# Patient Record
Sex: Male | Born: 1945
Health system: Southern US, Community
[De-identification: ages and names within clinical notes are randomized; demographics above are authoritative.]

## PROBLEM LIST (undated history)

## (undated) DIAGNOSIS — Z87898 Personal history of other specified conditions: Secondary | ICD-10-CM

## (undated) DIAGNOSIS — K5730 Diverticulosis of large intestine without perforation or abscess without bleeding: Secondary | ICD-10-CM

## (undated) DIAGNOSIS — T4145XA Adverse effect of unspecified anesthetic, initial encounter: Secondary | ICD-10-CM

## (undated) DIAGNOSIS — G589 Mononeuropathy, unspecified: Secondary | ICD-10-CM

## (undated) DIAGNOSIS — Z860100 Personal history of colon polyps, unspecified: Secondary | ICD-10-CM

## (undated) DIAGNOSIS — T8859XA Other complications of anesthesia, initial encounter: Secondary | ICD-10-CM

## (undated) DIAGNOSIS — M1990 Unspecified osteoarthritis, unspecified site: Secondary | ICD-10-CM

## (undated) DIAGNOSIS — R339 Retention of urine, unspecified: Secondary | ICD-10-CM

## (undated) DIAGNOSIS — Z973 Presence of spectacles and contact lenses: Secondary | ICD-10-CM

## (undated) DIAGNOSIS — S149XXA Injury of unspecified nerves of neck, initial encounter: Secondary | ICD-10-CM

## (undated) DIAGNOSIS — G629 Polyneuropathy, unspecified: Secondary | ICD-10-CM

## (undated) DIAGNOSIS — Z87442 Personal history of urinary calculi: Secondary | ICD-10-CM

## (undated) DIAGNOSIS — Z974 Presence of external hearing-aid: Secondary | ICD-10-CM

## (undated) DIAGNOSIS — Z8614 Personal history of Methicillin resistant Staphylococcus aureus infection: Secondary | ICD-10-CM

## (undated) DIAGNOSIS — N401 Enlarged prostate with lower urinary tract symptoms: Secondary | ICD-10-CM

## (undated) HISTORY — PX: COLONOSCOPY: SHX174

## (undated) HISTORY — PX: LUMBAR FUSION: SHX111

## (undated) HISTORY — PX: CYSTOSCOPY WITH INSERTION OF UROLIFT: SHX6678

## (undated) HISTORY — PX: ELBOW SURGERY: SHX618

## (undated) HISTORY — PX: TOTAL HIP REVISION: SHX763

## (undated) HISTORY — PX: ROTATOR CUFF REPAIR: SHX139

---

## 1966-02-10 HISTORY — PX: HAND SURGERY: SHX662

## 1968-02-11 HISTORY — PX: LAMINECTOMY: SHX219

## 1973-02-10 HISTORY — PX: CYST REMOVAL LEG: SHX6280

## 1974-02-10 HISTORY — PX: SHOULDER SURGERY: SHX246

## 1975-02-11 HISTORY — PX: DISTAL CLAVICLE EXCISION: SHX1463

## 1975-02-11 HISTORY — PX: SHOULDER ARTHROSCOPY WITH DISTAL CLAVICLE RESECTION: SHX5675

## 2000-01-31 ENCOUNTER — Ambulatory Visit (HOSPITAL_COMMUNITY): Admission: RE | Admit: 2000-01-31 | Discharge: 2000-01-31 | Payer: Self-pay | Admitting: Gastroenterology

## 2005-02-10 DIAGNOSIS — Z8614 Personal history of Methicillin resistant Staphylococcus aureus infection: Secondary | ICD-10-CM

## 2005-02-10 HISTORY — DX: Personal history of Methicillin resistant Staphylococcus aureus infection: Z86.14

## 2005-05-26 ENCOUNTER — Ambulatory Visit: Payer: Self-pay | Admitting: Infectious Diseases

## 2005-05-26 ENCOUNTER — Inpatient Hospital Stay (HOSPITAL_COMMUNITY): Admission: RE | Admit: 2005-05-26 | Discharge: 2005-06-04 | Payer: Self-pay | Admitting: Orthopedic Surgery

## 2015-03-01 DIAGNOSIS — M9902 Segmental and somatic dysfunction of thoracic region: Secondary | ICD-10-CM | POA: Diagnosis not present

## 2015-03-01 DIAGNOSIS — M542 Cervicalgia: Secondary | ICD-10-CM | POA: Diagnosis not present

## 2015-03-01 DIAGNOSIS — M9905 Segmental and somatic dysfunction of pelvic region: Secondary | ICD-10-CM | POA: Diagnosis not present

## 2015-03-01 DIAGNOSIS — M9904 Segmental and somatic dysfunction of sacral region: Secondary | ICD-10-CM | POA: Diagnosis not present

## 2015-03-01 DIAGNOSIS — M9903 Segmental and somatic dysfunction of lumbar region: Secondary | ICD-10-CM | POA: Diagnosis not present

## 2015-03-01 DIAGNOSIS — M9901 Segmental and somatic dysfunction of cervical region: Secondary | ICD-10-CM | POA: Diagnosis not present

## 2015-03-01 DIAGNOSIS — M545 Low back pain: Secondary | ICD-10-CM | POA: Diagnosis not present

## 2015-03-29 DIAGNOSIS — M9904 Segmental and somatic dysfunction of sacral region: Secondary | ICD-10-CM | POA: Diagnosis not present

## 2015-03-29 DIAGNOSIS — M9901 Segmental and somatic dysfunction of cervical region: Secondary | ICD-10-CM | POA: Diagnosis not present

## 2015-03-29 DIAGNOSIS — Z23 Encounter for immunization: Secondary | ICD-10-CM | POA: Diagnosis not present

## 2015-03-29 DIAGNOSIS — M542 Cervicalgia: Secondary | ICD-10-CM | POA: Diagnosis not present

## 2015-03-29 DIAGNOSIS — M9903 Segmental and somatic dysfunction of lumbar region: Secondary | ICD-10-CM | POA: Diagnosis not present

## 2015-03-29 DIAGNOSIS — M9905 Segmental and somatic dysfunction of pelvic region: Secondary | ICD-10-CM | POA: Diagnosis not present

## 2015-03-29 DIAGNOSIS — M545 Low back pain: Secondary | ICD-10-CM | POA: Diagnosis not present

## 2015-03-29 DIAGNOSIS — M9902 Segmental and somatic dysfunction of thoracic region: Secondary | ICD-10-CM | POA: Diagnosis not present

## 2015-05-01 DIAGNOSIS — M542 Cervicalgia: Secondary | ICD-10-CM | POA: Diagnosis not present

## 2015-05-01 DIAGNOSIS — M9903 Segmental and somatic dysfunction of lumbar region: Secondary | ICD-10-CM | POA: Diagnosis not present

## 2015-05-01 DIAGNOSIS — M9901 Segmental and somatic dysfunction of cervical region: Secondary | ICD-10-CM | POA: Diagnosis not present

## 2015-05-01 DIAGNOSIS — M9902 Segmental and somatic dysfunction of thoracic region: Secondary | ICD-10-CM | POA: Diagnosis not present

## 2015-05-01 DIAGNOSIS — M9905 Segmental and somatic dysfunction of pelvic region: Secondary | ICD-10-CM | POA: Diagnosis not present

## 2015-05-01 DIAGNOSIS — M545 Low back pain: Secondary | ICD-10-CM | POA: Diagnosis not present

## 2015-05-01 DIAGNOSIS — M9904 Segmental and somatic dysfunction of sacral region: Secondary | ICD-10-CM | POA: Diagnosis not present

## 2015-05-02 DIAGNOSIS — Z1389 Encounter for screening for other disorder: Secondary | ICD-10-CM | POA: Diagnosis not present

## 2015-05-02 DIAGNOSIS — Z79899 Other long term (current) drug therapy: Secondary | ICD-10-CM | POA: Diagnosis not present

## 2015-05-07 DIAGNOSIS — M5010 Cervical disc disorder with radiculopathy, unspecified cervical region: Secondary | ICD-10-CM | POA: Diagnosis not present

## 2015-05-07 DIAGNOSIS — G4700 Insomnia, unspecified: Secondary | ICD-10-CM | POA: Diagnosis not present

## 2015-05-07 DIAGNOSIS — R7309 Other abnormal glucose: Secondary | ICD-10-CM | POA: Diagnosis not present

## 2015-05-07 DIAGNOSIS — L570 Actinic keratosis: Secondary | ICD-10-CM | POA: Diagnosis not present

## 2015-05-16 DIAGNOSIS — Z7189 Other specified counseling: Secondary | ICD-10-CM | POA: Diagnosis not present

## 2015-05-16 DIAGNOSIS — L814 Other melanin hyperpigmentation: Secondary | ICD-10-CM | POA: Diagnosis not present

## 2015-05-16 DIAGNOSIS — D225 Melanocytic nevi of trunk: Secondary | ICD-10-CM | POA: Diagnosis not present

## 2015-05-16 DIAGNOSIS — D2272 Melanocytic nevi of left lower limb, including hip: Secondary | ICD-10-CM | POA: Diagnosis not present

## 2015-05-16 DIAGNOSIS — L570 Actinic keratosis: Secondary | ICD-10-CM | POA: Diagnosis not present

## 2015-05-16 DIAGNOSIS — L821 Other seborrheic keratosis: Secondary | ICD-10-CM | POA: Diagnosis not present

## 2015-05-16 DIAGNOSIS — D485 Neoplasm of uncertain behavior of skin: Secondary | ICD-10-CM | POA: Diagnosis not present

## 2015-05-16 DIAGNOSIS — L578 Other skin changes due to chronic exposure to nonionizing radiation: Secondary | ICD-10-CM | POA: Diagnosis not present

## 2015-05-16 DIAGNOSIS — I788 Other diseases of capillaries: Secondary | ICD-10-CM | POA: Diagnosis not present

## 2015-05-29 DIAGNOSIS — M542 Cervicalgia: Secondary | ICD-10-CM | POA: Diagnosis not present

## 2015-05-29 DIAGNOSIS — M9901 Segmental and somatic dysfunction of cervical region: Secondary | ICD-10-CM | POA: Diagnosis not present

## 2015-05-29 DIAGNOSIS — M545 Low back pain: Secondary | ICD-10-CM | POA: Diagnosis not present

## 2015-05-29 DIAGNOSIS — M9904 Segmental and somatic dysfunction of sacral region: Secondary | ICD-10-CM | POA: Diagnosis not present

## 2015-05-29 DIAGNOSIS — M9902 Segmental and somatic dysfunction of thoracic region: Secondary | ICD-10-CM | POA: Diagnosis not present

## 2015-05-29 DIAGNOSIS — M9905 Segmental and somatic dysfunction of pelvic region: Secondary | ICD-10-CM | POA: Diagnosis not present

## 2015-05-29 DIAGNOSIS — M9903 Segmental and somatic dysfunction of lumbar region: Secondary | ICD-10-CM | POA: Diagnosis not present

## 2015-07-03 DIAGNOSIS — M545 Low back pain: Secondary | ICD-10-CM | POA: Diagnosis not present

## 2015-07-03 DIAGNOSIS — M9903 Segmental and somatic dysfunction of lumbar region: Secondary | ICD-10-CM | POA: Diagnosis not present

## 2015-07-03 DIAGNOSIS — M9901 Segmental and somatic dysfunction of cervical region: Secondary | ICD-10-CM | POA: Diagnosis not present

## 2015-07-03 DIAGNOSIS — M542 Cervicalgia: Secondary | ICD-10-CM | POA: Diagnosis not present

## 2015-07-03 DIAGNOSIS — M9902 Segmental and somatic dysfunction of thoracic region: Secondary | ICD-10-CM | POA: Diagnosis not present

## 2015-07-03 DIAGNOSIS — M9905 Segmental and somatic dysfunction of pelvic region: Secondary | ICD-10-CM | POA: Diagnosis not present

## 2015-07-03 DIAGNOSIS — M9904 Segmental and somatic dysfunction of sacral region: Secondary | ICD-10-CM | POA: Diagnosis not present

## 2015-08-02 DIAGNOSIS — M9902 Segmental and somatic dysfunction of thoracic region: Secondary | ICD-10-CM | POA: Diagnosis not present

## 2015-08-02 DIAGNOSIS — M9901 Segmental and somatic dysfunction of cervical region: Secondary | ICD-10-CM | POA: Diagnosis not present

## 2015-08-02 DIAGNOSIS — M9905 Segmental and somatic dysfunction of pelvic region: Secondary | ICD-10-CM | POA: Diagnosis not present

## 2015-08-02 DIAGNOSIS — M9904 Segmental and somatic dysfunction of sacral region: Secondary | ICD-10-CM | POA: Diagnosis not present

## 2015-08-02 DIAGNOSIS — M545 Low back pain: Secondary | ICD-10-CM | POA: Diagnosis not present

## 2015-08-02 DIAGNOSIS — M542 Cervicalgia: Secondary | ICD-10-CM | POA: Diagnosis not present

## 2015-08-02 DIAGNOSIS — M9903 Segmental and somatic dysfunction of lumbar region: Secondary | ICD-10-CM | POA: Diagnosis not present

## 2015-09-28 DIAGNOSIS — L570 Actinic keratosis: Secondary | ICD-10-CM | POA: Diagnosis not present

## 2015-10-25 DIAGNOSIS — M25552 Pain in left hip: Secondary | ICD-10-CM | POA: Diagnosis not present

## 2015-10-25 DIAGNOSIS — G8929 Other chronic pain: Secondary | ICD-10-CM | POA: Diagnosis not present

## 2015-10-25 DIAGNOSIS — Z981 Arthrodesis status: Secondary | ICD-10-CM | POA: Diagnosis not present

## 2015-10-25 DIAGNOSIS — M170 Bilateral primary osteoarthritis of knee: Secondary | ICD-10-CM | POA: Diagnosis not present

## 2015-10-25 DIAGNOSIS — R2689 Other abnormalities of gait and mobility: Secondary | ICD-10-CM | POA: Diagnosis not present

## 2015-10-25 DIAGNOSIS — Z9689 Presence of other specified functional implants: Secondary | ICD-10-CM | POA: Diagnosis not present

## 2015-10-25 DIAGNOSIS — M533 Sacrococcygeal disorders, not elsewhere classified: Secondary | ICD-10-CM | POA: Diagnosis not present

## 2015-10-25 DIAGNOSIS — M1612 Unilateral primary osteoarthritis, left hip: Secondary | ICD-10-CM | POA: Diagnosis not present

## 2015-10-29 ENCOUNTER — Encounter: Payer: Self-pay | Admitting: Family Medicine

## 2015-10-29 DIAGNOSIS — R7309 Other abnormal glucose: Secondary | ICD-10-CM | POA: Diagnosis not present

## 2015-11-07 DIAGNOSIS — M25559 Pain in unspecified hip: Secondary | ICD-10-CM | POA: Diagnosis not present

## 2015-11-07 DIAGNOSIS — R7309 Other abnormal glucose: Secondary | ICD-10-CM | POA: Diagnosis not present

## 2015-11-07 DIAGNOSIS — Z6834 Body mass index (BMI) 34.0-34.9, adult: Secondary | ICD-10-CM | POA: Diagnosis not present

## 2015-11-09 DIAGNOSIS — M25552 Pain in left hip: Secondary | ICD-10-CM | POA: Diagnosis not present

## 2015-11-09 DIAGNOSIS — M1612 Unilateral primary osteoarthritis, left hip: Secondary | ICD-10-CM | POA: Diagnosis not present

## 2015-11-13 NOTE — Progress Notes (Signed)
Pt is being scheduled for preop appt; please place surgical orders in epic. Thanks.  

## 2015-11-14 NOTE — Patient Instructions (Addendum)
Jacob Mitchell  11/14/2015   Your procedure is scheduled on: 11/23/2015    Report to [facility]  Entrance take [place]  elevators to 3rd floor to  [place] at 1130 AM.  Call this number if you have problems the morning of surgery [phone]   Remember: ONLY 1 PERSON MAY GO WITH YOU TO SHORT STAY TO GET  READY MORNING OF [place].  Do not eat food after midnite.  May have clear liquids from 12 midnite until 0700am morning of surgery then nothing by mouth.       Take these medicines the morning of surgery with A SIP OF WATER: none                                 You may not have any metal on your body including hair pins and              piercings  Do not wear jewelry, lotions, powders or perfumes, deodorant                         Men may shave face and neck.   Do not bring valuables to the hospital. [place].  Contacts, dentures or bridgework may not be worn into surgery.  Leave suitcase in the car. After surgery it may be brought to your room.               Please read over the following fact sheets you were given: _____________________________________________________________________             [facility] - Preparing for Surgery Before surgery, you can play an important role.  Because skin is not sterile, your skin needs to be as free of germs as possible.  You can reduce the number of germs on your skin by washing with CHG (chlorahexidine gluconate) soap before surgery.  CHG is an antiseptic cleaner which kills germs and bonds with the skin to continue killing germs even after washing. Please DO NOT use if you have an allergy to CHG or antibacterial soaps.  If your skin becomes reddened/irritated stop using the CHG and inform your nurse when you arrive at Short Stay. Do not shave (including legs and underarms) for at least 48 hours prior to the first CHG shower.  You may shave your  face/neck. Please follow these instructions carefully:  1.  Shower with CHG Soap the night before surgery and the  morning of Surgery.  2.  If you choose to wash your hair, wash your hair first as usual with your  normal  shampoo.  3.  After you shampoo, rinse your hair and body thoroughly to remove the  shampoo.                           4.  Use CHG as you would any other liquid soap.  You can apply chg directly  to the skin and wash                       Gently with a scrungie or clean washcloth.  5.  Apply the CHG Soap to your  body ONLY FROM THE NECK DOWN.   Do not use on face/ open                           Wound or open sores. Avoid contact with eyes, ears mouth and genitals (private parts).                       Wash face,  Genitals (private parts) with your normal soap.             6.  Wash thoroughly, paying special attention to the area where your surgery  will be performed.  7.  Thoroughly rinse your body with warm water from the neck down.  8.  DO NOT shower/wash with your normal soap after using and rinsing off  the CHG Soap.                9.  Pat yourself dry with a clean towel.            10.  Wear clean pajamas.            11.  Place clean sheets on your bed the night of your first shower and do not  sleep with pets. Day of Surgery : Do not apply any lotions/deodorants the morning of surgery.  Please wear clean clothes to the hospital/surgery center.  FAILURE TO FOLLOW THESE INSTRUCTIONS MAY RESULT IN THE CANCELLATION OF YOUR SURGERY PATIENT SIGNATURE_________________________________  NURSE SIGNATURE__________________________________  ________________________________________________________________________  WHAT IS A BLOOD TRANSFUSION? Blood Transfusion Information  A transfusion is the replacement of blood or some of its parts. Blood is made up of multiple cells which provide different functions.  Red blood cells carry oxygen and are used for blood loss  replacement.  White blood cells fight against infection.  Platelets control bleeding.  Plasma helps clot blood.  Other blood products are available for specialized needs, such as hemophilia or other clotting disorders. BEFORE THE TRANSFUSION  Who gives blood for transfusions?   Healthy volunteers who are fully evaluated to make sure their blood is safe. This is blood bank blood. Transfusion therapy is the safest it has ever been in the practice of medicine. Before blood is taken from a donor, a complete history is taken to make sure that person has no history of diseases nor engages in risky social behavior (examples are intravenous drug use or sexual activity with multiple partners). The donor's travel history is screened to minimize risk of transmitting infections, such as malaria. The donated blood is tested for signs of infectious diseases, such as HIV and hepatitis. The blood is then tested to be sure it is compatible with you in order to minimize the chance of a transfusion reaction. If you or a relative donates blood, this is often done in anticipation of surgery and is not appropriate for emergency situations. It takes many days to process the donated blood. RISKS AND COMPLICATIONS Although transfusion therapy is very safe and saves many lives, the main dangers of transfusion include:   Getting an infectious disease.  Developing a transfusion reaction. This is an allergic reaction to something in the blood you were given. Every precaution is taken to prevent this. The decision to have a blood transfusion has been considered carefully by your caregiver before blood is given. Blood is not given unless the benefits outweigh the risks. AFTER THE TRANSFUSION  Right after receiving a blood transfusion, you will usually feel  much better and more energetic. This is especially true if your red blood cells have gotten low (anemic). The transfusion raises the level of the red blood cells which  carry oxygen, and this usually causes an energy increase.  The nurse administering the transfusion will monitor you carefully for complications. HOME CARE INSTRUCTIONS  No special instructions are needed after a transfusion. You may find your energy is better. Speak with your caregiver about any limitations on activity for underlying diseases you may have. SEEK MEDICAL CARE IF:   Your condition is not improving after your transfusion.  You develop redness or irritation at the intravenous (IV) site. SEEK IMMEDIATE MEDICAL CARE IF:  Any of the following symptoms occur over the next 12 hours:  Shaking chills.  You have a temperature by mouth above 102 F (38.9 C), not controlled by medicine.  Chest, back, or muscle pain.  People around you feel you are not acting correctly or are confused.  Shortness of breath or difficulty breathing.  Dizziness and fainting.  You get a rash or develop hives.  You have a decrease in urine output.  Your urine turns a dark color or changes to pink, red, or brown. Any of the following symptoms occur over the next 10 days:  You have a temperature by mouth above 102 F (38.9 C), not controlled by medicine.  Shortness of breath.  Weakness after normal activity.  The white part of the eye turns yellow (jaundice).  You have a decrease in the amount of urine or are urinating less often.  Your urine turns a dark color or changes to pink, red, or brown. Document Released: 01/25/2000 Document Revised: 04/21/2011 Document Reviewed: 09/13/2007 ExitCare Patient Information 2014 [place].  _______________________________________________________________________  Incentive Spirometer  An incentive spirometer is a tool that can help keep your lungs clear and active. This tool measures how well you are filling your lungs with each breath. Taking long deep breaths may help reverse or decrease the chance of developing breathing (pulmonary) problems  (especially infection) following:  A long period of time when you are unable to move or be active. BEFORE THE PROCEDURE   If the spirometer includes an indicator to show your best effort, your nurse or respiratory therapist will set it to a desired goal.  If possible, sit up straight or lean slightly forward. Try not to slouch.  Hold the incentive spirometer in an upright position. INSTRUCTIONS FOR USE  1. Sit on the edge of your bed if possible, or sit up as far as you can in bed or on a chair. 2. Hold the incentive spirometer in an upright position. 3. Breathe out normally. 4. Place the mouthpiece in your mouth and seal your lips tightly around it. 5. Breathe in slowly and as deeply as possible, raising the piston or the ball toward the top of the column. 6. Hold your breath for 3-5 seconds or for as long as possible. Allow the piston or ball to fall to the bottom of the column. 7. Remove the mouthpiece from your mouth and breathe out normally. 8. Rest for a few seconds and repeat Steps 1 through 7 at least 10 times every 1-2 hours when you are awake. Take your time and take a few normal breaths between deep breaths. 9. The spirometer may include an indicator to show your best effort. Use the indicator as a goal to work toward during each repetition. 10. After each set of 10 deep breaths, practice coughing to be  sure your lungs are clear. If you have an incision (the cut made at the time of surgery), support your incision when coughing by placing a pillow or rolled up towels firmly against it. Once you are able to get out of bed, walk around indoors and cough well. You may stop using the incentive spirometer when instructed by your caregiver.  RISKS AND COMPLICATIONS  Take your time so you do not get dizzy or light-headed.  If you are in pain, you may need to take or ask for pain medication before doing incentive spirometry. It is harder to take a deep breath if you are having  pain. AFTER USE  Rest and breathe slowly and easily.  It can be helpful to keep track of a log of your progress. Your caregiver can provide you with a simple table to help with this. If you are using the spirometer at home, follow these instructions: [place] IF:   You are having difficultly using the spirometer.  You have trouble using the spirometer as often as instructed.  Your pain medication is not giving enough relief while using the spirometer.  You develop fever of 100.5 F (38.1 C) or higher. SEEK IMMEDIATE MEDICAL CARE IF:   You cough up bloody sputum that had not been present before.  You develop fever of 102 F (38.9 C) or greater.  You develop worsening pain at or near the incision site. MAKE SURE YOU:   Understand these instructions.  Will watch your condition.  Will get help right away if you are not doing well or get worse. Document Released: 06/09/2006 Document Revised: 04/21/2011 Document Reviewed: 08/10/2006 ExitCare Patient Information 2014 [place].   ________________________________________________________________________    CLEAR LIQUID DIET   Foods Allowed                                                                     Foods Excluded  Coffee and tea, regular and decaf                             liquids that you cannot  Plain Jell-O in any flavor                                             see through such as: Fruit ices (not with fruit pulp)                                     milk, soups, orange juice  Iced Popsicles                                    All solid food Carbonated beverages, regular and diet                                    Cranberry, grape and apple juices Sports drinks like  Gatorade Lightly seasoned clear broth or consume(fat free) Sugar, honey syrup  Sample Menu Breakfast                                Lunch                                     Supper Cranberry juice                    Beef broth                             Chicken broth Jell-O                                     Grape juice                           Apple juice Coffee or tea                        Jell-O                                      Popsicle                                                Coffee or tea                        Coffee or tea  _____________________________________________________________________

## 2015-11-16 ENCOUNTER — Encounter (INDEPENDENT_AMBULATORY_CARE_PROVIDER_SITE_OTHER): Payer: Self-pay

## 2015-11-16 ENCOUNTER — Encounter (HOSPITAL_COMMUNITY): Payer: Self-pay

## 2015-11-16 ENCOUNTER — Encounter (HOSPITAL_COMMUNITY)
Admission: RE | Admit: 2015-11-16 | Discharge: 2015-11-16 | Disposition: A | Discharge status: Home / Self Care | Payer: Medicare Other | Source: Ambulatory Visit | Attending: Orthopedic Surgery | Admitting: Orthopedic Surgery

## 2015-11-16 DIAGNOSIS — M1612 Unilateral primary osteoarthritis, left hip: Secondary | ICD-10-CM | POA: Diagnosis not present

## 2015-11-16 DIAGNOSIS — Z01818 Encounter for other preprocedural examination: Secondary | ICD-10-CM | POA: Insufficient documentation

## 2015-11-16 DIAGNOSIS — Z01812 Encounter for preprocedural laboratory examination: Secondary | ICD-10-CM | POA: Diagnosis not present

## 2015-11-16 HISTORY — DX: Adverse effect of unspecified anesthetic, initial encounter: T41.45XA

## 2015-11-16 HISTORY — DX: Personal history of urinary calculi: Z87.442

## 2015-11-16 HISTORY — DX: Other complications of anesthesia, initial encounter: T88.59XA

## 2015-11-16 LAB — CBC
HCT: 45.2 % (ref 39.0–52.0)
Hemoglobin: 15 g/dL (ref 13.0–17.0)
MCH: 29.3 pg (ref 26.0–34.0)
MCHC: 33.2 g/dL (ref 30.0–36.0)
MCV: 88.3 fL (ref 78.0–100.0)
Platelets: 211 10*3/uL (ref 150–400)
RBC: 5.12 MIL/uL (ref 4.22–5.81)
RDW: 13.2 % (ref 11.5–15.5)
WBC: 6.8 10*3/uL (ref 4.0–10.5)

## 2015-11-16 LAB — SURGICAL PCR SCREEN
MRSA, PCR: NEGATIVE
Staphylococcus aureus: POSITIVE — AB

## 2015-11-16 LAB — ABO/RH: ABO/RH(D): O POS

## 2015-11-16 NOTE — H&P (Signed)
TOTAL HIP ADMISSION H&P  Patient is admitted for left total hip arthroplasty, anterior approach.  Subjective:  Chief Complaint:    Left hip primary OA / pain  HPI: Jacob Mitchell, 70 y.o. male, has a history of pain and functional disability in the left hip(s) due to arthritis and patient has failed non-surgical conservative treatments for greater than 12 weeks to include NSAID's and/or analgesics, use of assistive devices and activity modification.  Onset of symptoms was gradual starting years ago with gradually worsening course since that time.The patient noted no past surgery on the left hip(s).  Patient currently rates pain in the left hip at 9 out of 10 with activity. Patient has worsening of pain with activity and weight bearing, trendelenberg gait, pain that interfers with activities of daily living and pain with passive range of motion. Patient has evidence of periarticular osteophytes and joint space narrowing by imaging studies. This condition presents safety issues increasing the risk of falls.  There is no current active infection.  Risks, benefits and expectations were discussed with the patient.  Risks including but not limited to the risk of anesthesia, blood clots, nerve damage, blood vessel damage, failure of the prosthesis, infection and up to and including death.  Patient understand the risks, benefits and expectations and wishes to proceed with surgery.   PCP: No primary care provider on file.  D/C Plans:      Home  Post-op Meds:       No Rx given  Tranexamic Acid:      To be given - IV   Decadron:      Is to be given  FYI:     ASA  Norco    Past Medical History:  Diagnosis Date  . Arthritis   . Complication of anesthesia    pt says he gets "restless" when he is waking up and nurses have had to "hold him down"  . History of kidney stones     Past Surgical History:  Procedure Laterality Date  . CYST REMOVAL LEG Right   . [place]  . HAND SURGERY  1968  .  LAMINECTOMY  1970  . LUMBAR FUSION     L1-L5  . ROTATOR CUFF REPAIR     5 surgeries in 9 days  . SHOULDER SURGERY Left 1976    No prescriptions prior to admission.   No Known Allergies   Social History  Substance Use Topics  . Smoking status: Never Smoker  . Smokeless tobacco: Never Used  . Alcohol use No       Review of Systems  Constitutional: Negative.   HENT: Negative.   Eyes: Negative.   Respiratory: Negative.   Cardiovascular: Negative.   Gastrointestinal: Negative.   Genitourinary: Negative.   Musculoskeletal: Positive for joint pain.  Skin: Negative.   Neurological: Negative.   Endo/Heme/Allergies: Negative.   Psychiatric/Behavioral: Negative.     Objective:  Physical Exam  Constitutional: He is oriented to person, place, and time. He appears well-developed.  HENT:  Head: Normocephalic.  Eyes: Pupils are equal, round, and reactive to light.  Neck: Neck supple. No JVD present. No tracheal deviation present. No thyromegaly present.  Cardiovascular: Normal rate, regular rhythm, normal heart sounds and intact distal pulses.   Respiratory: Effort normal and breath sounds normal. No respiratory distress. He has no wheezes.  GI: Soft. There is no tenderness. There is no guarding.  Musculoskeletal:       Left hip: He exhibits decreased range  of motion, decreased strength, tenderness and bony tenderness. He exhibits no swelling, no deformity and no laceration.  Lymphadenopathy:    He has no cervical adenopathy.  Neurological: He is alert and oriented to person, place, and time.  Skin: Skin is warm and dry.  Psychiatric: He has a normal mood and affect.    Vital signs in last 24 hours: Temp:  [97.8 F (36.6 C)] 97.8 F (36.6 C) (10/06 0910) Pulse Rate:  [63] 63 (10/06 0910) Resp:  [16] 16 (10/06 0910) BP: (127)/(78) 127/78 (10/06 0910) SpO2:  [100 %] 100 % (10/06 0910) Weight:  [129.3 kg (285 lb)] 129.3 kg (285 lb) (10/06 0910)   Labs:  Estimated body  mass index is 33.8 kg/m as calculated from the following:   Height as of 11/16/15: 6\' 5"  (1.956 m).   Weight as of 11/16/15: 129.3 kg (285 lb).   Imaging Review Plain radiographs demonstrate severe degenerative joint disease of the left hip(s). The bone quality appears to be good for age and reported activity level.  Assessment/Plan:  End stage arthritis, left hip(s)  The patient history, physical examination, clinical judgement of the provider and imaging studies are consistent with end stage degenerative joint disease of the left hip(s) and total hip arthroplasty is deemed medically necessary. The treatment options including medical management, injection therapy, arthroscopy and arthroplasty were discussed at length. The risks and benefits of total hip arthroplasty were presented and reviewed. The risks due to aseptic loosening, infection, stiffness, dislocation/subluxation,  thromboembolic complications and other imponderables were discussed.  The patient acknowledged the explanation, agreed to proceed with the plan and consent was signed. Patient is being admitted for inpatient treatment for surgery, pain control, PT, OT, prophylactic antibiotics, VTE prophylaxis, progressive ambulation and ADL's and discharge planning.The patient is planning to be discharged home.     [place] Toby Ayad   PA-C  11/16/2015, 10:54 PM

## 2015-11-22 MED ORDER — CEFAZOLIN SODIUM 10 G IJ SOLR
3.0000 g | INTRAMUSCULAR | Status: AC
  Administered 2015-11-23: 3 g via INTRAVENOUS
  Filled 2015-11-22: qty 3000

## 2015-11-23 ENCOUNTER — Inpatient Hospital Stay (HOSPITAL_COMMUNITY): Payer: Medicare Other | Admitting: Anesthesiology

## 2015-11-23 ENCOUNTER — Inpatient Hospital Stay (HOSPITAL_COMMUNITY)
Admission: RE | Admit: 2015-11-23 | Discharge: 2015-11-25 | DRG: 470 | Disposition: A | Discharge status: Home / Self Care | Payer: Medicare Other | Source: Ambulatory Visit | Attending: Orthopedic Surgery | Admitting: Orthopedic Surgery

## 2015-11-23 ENCOUNTER — Inpatient Hospital Stay (HOSPITAL_COMMUNITY): Payer: Medicare Other

## 2015-11-23 ENCOUNTER — Encounter (HOSPITAL_COMMUNITY): Payer: Self-pay | Admitting: *Deleted

## 2015-11-23 ENCOUNTER — Encounter (HOSPITAL_COMMUNITY)
Admission: RE | Disposition: A | Discharge status: Home / Self Care | Payer: Self-pay | Source: Ambulatory Visit | Attending: Orthopedic Surgery

## 2015-11-23 DIAGNOSIS — M1612 Unilateral primary osteoarthritis, left hip: Principal | ICD-10-CM | POA: Diagnosis present

## 2015-11-23 DIAGNOSIS — Z23 Encounter for immunization: Secondary | ICD-10-CM

## 2015-11-23 DIAGNOSIS — Z471 Aftercare following joint replacement surgery: Secondary | ICD-10-CM | POA: Diagnosis not present

## 2015-11-23 DIAGNOSIS — Z96649 Presence of unspecified artificial hip joint: Secondary | ICD-10-CM

## 2015-11-23 DIAGNOSIS — Z96642 Presence of left artificial hip joint: Secondary | ICD-10-CM | POA: Diagnosis not present

## 2015-11-23 DIAGNOSIS — Z9889 Other specified postprocedural states: Secondary | ICD-10-CM

## 2015-11-23 DIAGNOSIS — D62 Acute posthemorrhagic anemia: Secondary | ICD-10-CM | POA: Diagnosis not present

## 2015-11-23 DIAGNOSIS — M25552 Pain in left hip: Secondary | ICD-10-CM | POA: Diagnosis not present

## 2015-11-23 HISTORY — PX: TOTAL HIP ARTHROPLASTY: SHX124

## 2015-11-23 LAB — TYPE AND SCREEN
ABO/RH(D): O POS
Antibody Screen: NEGATIVE

## 2015-11-23 LAB — POCT I-STAT 4, (NA,K, GLUC, HGB,HCT)
Glucose, Bld: 130 mg/dL — ABNORMAL HIGH (ref 65–99)
HCT: 33 % — ABNORMAL LOW (ref 39.0–52.0)
Hemoglobin: 11.2 g/dL — ABNORMAL LOW (ref 13.0–17.0)
Potassium: 4.2 mmol/L (ref 3.5–5.1)
Sodium: 139 mmol/L (ref 135–145)

## 2015-11-23 SURGERY — ARTHROPLASTY, HIP, TOTAL, ANTERIOR APPROACH
Anesthesia: General | Site: Hip | Laterality: Left

## 2015-11-23 MED ORDER — POLYETHYLENE GLYCOL 3350 17 G PO PACK: 17.0000 g | PACK | Freq: Two times a day (BID) | ORAL | 0 refills | Status: DC

## 2015-11-23 MED ORDER — CEFAZOLIN SODIUM-DEXTROSE 2-4 GM/100ML-% IV SOLN
2.0000 g | Freq: Four times a day (QID) | INTRAVENOUS | Status: AC
  Administered 2015-11-23 – 2015-11-24 (×2): 2 g via INTRAVENOUS
  Filled 2015-11-23 (×2): qty 100

## 2015-11-23 MED ORDER — FENTANYL CITRATE (PF) 100 MCG/2ML IJ SOLN
INTRAMUSCULAR | Status: DC | PRN
  Administered 2015-11-23 (×8): 50 ug via INTRAVENOUS

## 2015-11-23 MED ORDER — ROCURONIUM BROMIDE 10 MG/ML (PF) SYRINGE
PREFILLED_SYRINGE | INTRAVENOUS | Status: AC
  Filled 2015-11-23: qty 10

## 2015-11-23 MED ORDER — PHENOL 1.4 % MT LIQD: 1.0000 | OROMUCOSAL | Status: DC | PRN

## 2015-11-23 MED ORDER — SODIUM CHLORIDE 0.9 % IV SOLN
100.0000 mL/h | INTRAVENOUS | Status: DC
  Administered 2015-11-23: 100 mL/h via INTRAVENOUS
  Filled 2015-11-23 (×5): qty 1000

## 2015-11-23 MED ORDER — FERROUS SULFATE 325 (65 FE) MG PO TABS
325.0000 mg | ORAL_TABLET | Freq: Three times a day (TID) | ORAL | Status: DC
  Administered 2015-11-24 – 2015-11-25 (×4): 325 mg via ORAL
  Filled 2015-11-23 (×4): qty 1

## 2015-11-23 MED ORDER — CELECOXIB 200 MG PO CAPS
200.0000 mg | ORAL_CAPSULE | Freq: Two times a day (BID) | ORAL | Status: DC
  Administered 2015-11-23 – 2015-11-25 (×4): 200 mg via ORAL
  Filled 2015-11-23 (×4): qty 1

## 2015-11-23 MED ORDER — FENTANYL CITRATE (PF) 100 MCG/2ML IJ SOLN
INTRAMUSCULAR | Status: AC
Start: 2015-11-23 — End: 2015-11-23
  Filled 2015-11-23: qty 2

## 2015-11-23 MED ORDER — HYDROCODONE-ACETAMINOPHEN 7.5-325 MG PO TABS: 1.0000 | ORAL_TABLET | ORAL | 0 refills | Status: DC | PRN

## 2015-11-23 MED ORDER — HYDROCODONE-ACETAMINOPHEN 7.5-325 MG PO TABS
1.0000 | ORAL_TABLET | ORAL | Status: DC
  Administered 2015-11-24: 1 via ORAL
  Filled 2015-11-23 (×4): qty 2

## 2015-11-23 MED ORDER — MAGNESIUM CITRATE PO SOLN: 1.0000 | Freq: Once | ORAL | Status: DC | PRN

## 2015-11-23 MED ORDER — STERILE WATER FOR IRRIGATION IR SOLN
Status: DC | PRN
  Administered 2015-11-23: 3000 mL

## 2015-11-23 MED ORDER — HYDROMORPHONE HCL 1 MG/ML IJ SOLN
0.5000 mg | INTRAMUSCULAR | Status: DC | PRN
  Filled 2015-11-23: qty 1

## 2015-11-23 MED ORDER — METOCLOPRAMIDE HCL 5 MG/ML IJ SOLN: 5.0000 mg | Freq: Three times a day (TID) | INTRAMUSCULAR | Status: DC | PRN

## 2015-11-23 MED ORDER — PROPOFOL 10 MG/ML IV BOLUS
INTRAVENOUS | Status: AC
  Filled 2015-11-23: qty 20

## 2015-11-23 MED ORDER — HYDROMORPHONE HCL 1 MG/ML IJ SOLN
0.2500 mg | INTRAMUSCULAR | Status: DC | PRN
  Administered 2015-11-23 (×2): 0.5 mg via INTRAVENOUS

## 2015-11-23 MED ORDER — MIDAZOLAM HCL 2 MG/2ML IJ SOLN
INTRAMUSCULAR | Status: AC
  Filled 2015-11-23: qty 2

## 2015-11-23 MED ORDER — BISACODYL 10 MG RE SUPP: 10.0000 mg | Freq: Every day | RECTAL | Status: DC | PRN

## 2015-11-23 MED ORDER — FERROUS SULFATE 325 (65 FE) MG PO TABS: 325.0000 mg | ORAL_TABLET | Freq: Three times a day (TID) | ORAL | 3 refills | Status: DC

## 2015-11-23 MED ORDER — ONDANSETRON HCL 4 MG/2ML IJ SOLN
INTRAMUSCULAR | Status: DC | PRN
  Administered 2015-11-23: 4 mg via INTRAVENOUS

## 2015-11-23 MED ORDER — LACTATED RINGERS IV SOLN
INTRAVENOUS | Status: DC | PRN
  Administered 2015-11-23: 17:00:00 via INTRAVENOUS

## 2015-11-23 MED ORDER — ALUM & MAG HYDROXIDE-SIMETH 200-200-20 MG/5ML PO SUSP: 30.0000 mL | ORAL | Status: DC | PRN

## 2015-11-23 MED ORDER — ASPIRIN 81 MG PO CHEW
81.0000 mg | CHEWABLE_TABLET | Freq: Two times a day (BID) | ORAL | Status: DC
  Administered 2015-11-23 – 2015-11-25 (×4): 81 mg via ORAL
  Filled 2015-11-23 (×4): qty 1

## 2015-11-23 MED ORDER — DOCUSATE SODIUM 100 MG PO CAPS: 100.0000 mg | ORAL_CAPSULE | Freq: Two times a day (BID) | ORAL | 0 refills | Status: DC

## 2015-11-23 MED ORDER — ONDANSETRON HCL 4 MG/2ML IJ SOLN: 4.0000 mg | Freq: Four times a day (QID) | INTRAMUSCULAR | Status: DC | PRN

## 2015-11-23 MED ORDER — LACTATED RINGERS IV SOLN
INTRAVENOUS | Status: DC
Start: 2015-11-23 — End: 2015-11-25
  Administered 2015-11-23 (×4): via INTRAVENOUS

## 2015-11-23 MED ORDER — TRANEXAMIC ACID 1000 MG/10ML IV SOLN
1000.0000 mg | INTRAVENOUS | Status: AC
  Administered 2015-11-23: 1000 mg via INTRAVENOUS
  Filled 2015-11-23: qty 1100

## 2015-11-23 MED ORDER — MIDAZOLAM HCL 2 MG/2ML IJ SOLN
INTRAMUSCULAR | Status: DC | PRN
  Administered 2015-11-23 (×2): 1 mg via INTRAVENOUS

## 2015-11-23 MED ORDER — HYDROMORPHONE HCL 1 MG/ML IJ SOLN
INTRAMUSCULAR | Status: DC | PRN
  Administered 2015-11-23 (×4): 0.5 mg via INTRAVENOUS

## 2015-11-23 MED ORDER — ASPIRIN 81 MG PO CHEW
81.0000 mg | CHEWABLE_TABLET | Freq: Every day | ORAL | 0 refills | Status: DC
Start: 2015-11-23 — End: 2016-10-15

## 2015-11-23 MED ORDER — METHOCARBAMOL 500 MG PO TABS: 500.0000 mg | ORAL_TABLET | Freq: Four times a day (QID) | ORAL | 0 refills | Status: DC | PRN

## 2015-11-23 MED ORDER — ALBUMIN HUMAN 5 % IV SOLN
INTRAVENOUS | Status: DC | PRN
  Administered 2015-11-23 (×3): via INTRAVENOUS

## 2015-11-23 MED ORDER — LIDOCAINE 2% (20 MG/ML) 5 ML SYRINGE
INTRAMUSCULAR | Status: DC | PRN
  Administered 2015-11-23: 60 mg via INTRAVENOUS

## 2015-11-23 MED ORDER — SUGAMMADEX SODIUM 200 MG/2ML IV SOLN
INTRAVENOUS | Status: AC
Start: 2015-11-23 — End: 2015-11-23
  Filled 2015-11-23: qty 2

## 2015-11-23 MED ORDER — ASPIRIN 81 MG PO CHEW: 81.0000 mg | CHEWABLE_TABLET | Freq: Every day | ORAL | 0 refills | Status: DC

## 2015-11-23 MED ORDER — PHENYLEPHRINE 40 MCG/ML (10ML) SYRINGE FOR IV PUSH (FOR BLOOD PRESSURE SUPPORT)
PREFILLED_SYRINGE | INTRAVENOUS | Status: AC
  Filled 2015-11-23: qty 10

## 2015-11-23 MED ORDER — PROMETHAZINE HCL 25 MG/ML IJ SOLN
INTRAMUSCULAR | Status: AC
  Filled 2015-11-23: qty 1

## 2015-11-23 MED ORDER — DIPHENHYDRAMINE HCL 25 MG PO CAPS: 25.0000 mg | ORAL_CAPSULE | Freq: Four times a day (QID) | ORAL | Status: DC | PRN

## 2015-11-23 MED ORDER — CHLORHEXIDINE GLUCONATE 4 % EX LIQD: 60.0000 mL | Freq: Once | CUTANEOUS | Status: DC

## 2015-11-23 MED ORDER — LIDOCAINE 2% (20 MG/ML) 5 ML SYRINGE
INTRAMUSCULAR | Status: AC
  Filled 2015-11-23: qty 5

## 2015-11-23 MED ORDER — FENTANYL CITRATE (PF) 100 MCG/2ML IJ SOLN
INTRAMUSCULAR | Status: AC
  Filled 2015-11-23: qty 2

## 2015-11-23 MED ORDER — DEXAMETHASONE SODIUM PHOSPHATE 10 MG/ML IJ SOLN
10.0000 mg | Freq: Once | INTRAMUSCULAR | Status: AC
  Administered 2015-11-23: 10 mg via INTRAVENOUS

## 2015-11-23 MED ORDER — SODIUM CHLORIDE 0.9 % IR SOLN
Status: DC | PRN
  Administered 2015-11-23 (×2): 1000 mL

## 2015-11-23 MED ORDER — METHOCARBAMOL 500 MG PO TABS: 500.0000 mg | ORAL_TABLET | Freq: Four times a day (QID) | ORAL | Status: DC | PRN

## 2015-11-23 MED ORDER — EPHEDRINE 5 MG/ML INJ
INTRAVENOUS | Status: AC
  Filled 2015-11-23: qty 10

## 2015-11-23 MED ORDER — ALBUMIN HUMAN 5 % IV SOLN
INTRAVENOUS | Status: AC
  Filled 2015-11-23: qty 500

## 2015-11-23 MED ORDER — TRAZODONE HCL 50 MG PO TABS
50.0000 mg | ORAL_TABLET | Freq: Every day | ORAL | Status: DC
  Administered 2015-11-24: 50 mg via ORAL
  Filled 2015-11-23: qty 1

## 2015-11-23 MED ORDER — PROMETHAZINE HCL 25 MG/ML IJ SOLN: 6.2500 mg | INTRAMUSCULAR | Status: DC | PRN

## 2015-11-23 MED ORDER — POLYETHYLENE GLYCOL 3350 17 G PO PACK
17.0000 g | PACK | Freq: Two times a day (BID) | ORAL | Status: DC
  Administered 2015-11-24 – 2015-11-25 (×3): 17 g via ORAL
  Filled 2015-11-23 (×3): qty 1

## 2015-11-23 MED ORDER — TRANEXAMIC ACID 1000 MG/10ML IV SOLN
1000.0000 mg | INTRAVENOUS | Status: AC
  Administered 2015-11-23: 1000 mg via INTRAVENOUS
  Filled 2015-11-23: qty 10

## 2015-11-23 MED ORDER — SUGAMMADEX SODIUM 200 MG/2ML IV SOLN
INTRAVENOUS | Status: DC | PRN
  Administered 2015-11-23: 200 mg via INTRAVENOUS

## 2015-11-23 MED ORDER — HYDROMORPHONE HCL 1 MG/ML IJ SOLN
INTRAMUSCULAR | Status: AC
  Administered 2015-11-23: 0.5 mg via INTRAVENOUS
  Filled 2015-11-23: qty 1

## 2015-11-23 MED ORDER — MENTHOL 3 MG MT LOZG: 1.0000 | LOZENGE | OROMUCOSAL | Status: DC | PRN

## 2015-11-23 MED ORDER — HYDROMORPHONE HCL 2 MG/ML IJ SOLN
INTRAMUSCULAR | Status: AC
  Filled 2015-11-23: qty 1

## 2015-11-23 MED ORDER — METOCLOPRAMIDE HCL 5 MG PO TABS: 5.0000 mg | ORAL_TABLET | Freq: Three times a day (TID) | ORAL | Status: DC | PRN

## 2015-11-23 MED ORDER — HYDROCODONE-ACETAMINOPHEN 7.5-325 MG PO TABS: 1.0000 | ORAL_TABLET | Freq: Once | ORAL | Status: DC | PRN

## 2015-11-23 MED ORDER — DOCUSATE SODIUM 100 MG PO CAPS
100.0000 mg | ORAL_CAPSULE | Freq: Two times a day (BID) | ORAL | Status: DC
  Administered 2015-11-24 – 2015-11-25 (×3): 100 mg via ORAL
  Filled 2015-11-23 (×3): qty 1

## 2015-11-23 MED ORDER — ONDANSETRON HCL 4 MG/2ML IJ SOLN
INTRAMUSCULAR | Status: AC
  Filled 2015-11-23: qty 2

## 2015-11-23 MED ORDER — DEXAMETHASONE SODIUM PHOSPHATE 10 MG/ML IJ SOLN
10.0000 mg | Freq: Once | INTRAMUSCULAR | Status: AC
  Administered 2015-11-24: 10 mg via INTRAVENOUS
  Filled 2015-11-23: qty 1

## 2015-11-23 MED ORDER — HYDROMORPHONE HCL 1 MG/ML IJ SOLN
INTRAMUSCULAR | Status: AC
  Filled 2015-11-23: qty 1

## 2015-11-23 MED ORDER — PROPOFOL 10 MG/ML IV BOLUS
INTRAVENOUS | Status: DC | PRN
  Administered 2015-11-23: 200 mg via INTRAVENOUS

## 2015-11-23 MED ORDER — ALBUMIN HUMAN 5 % IV SOLN
INTRAVENOUS | Status: AC
  Filled 2015-11-23: qty 250

## 2015-11-23 MED ORDER — ONDANSETRON HCL 4 MG PO TABS: 4.0000 mg | ORAL_TABLET | Freq: Four times a day (QID) | ORAL | Status: DC | PRN

## 2015-11-23 MED ORDER — METHOCARBAMOL 1000 MG/10ML IJ SOLN
500.0000 mg | Freq: Four times a day (QID) | INTRAVENOUS | Status: DC | PRN
  Administered 2015-11-23: 500 mg via INTRAVENOUS
  Filled 2015-11-23: qty 550
  Filled 2015-11-23: qty 5

## 2015-11-23 MED ORDER — ROCURONIUM BROMIDE 10 MG/ML (PF) SYRINGE
PREFILLED_SYRINGE | INTRAVENOUS | Status: DC | PRN
  Administered 2015-11-23: 70 mg via INTRAVENOUS
  Administered 2015-11-23 (×7): 10 mg via INTRAVENOUS

## 2015-11-23 SURGICAL SUPPLY — 47 items
ADH SKN CLS APL DERMABOND .7 (GAUZE/BANDAGES/DRESSINGS) ×1
BAG DECANTER FOR FLEXI CONT (MISCELLANEOUS) IMPLANT
BAG SPEC THK2 15X12 ZIP CLS (MISCELLANEOUS)
BAG ZIPLOCK 12X15 (MISCELLANEOUS) IMPLANT
CAPT HIP TOTAL 2 ×1 IMPLANT
CLOTH BEACON ORANGE TIMEOUT ST (SAFETY) ×2 IMPLANT
COVER PERINEAL POST (MISCELLANEOUS) ×2 IMPLANT
CUP ACET PINNACLE SECTR 56MM (Hips) IMPLANT
CUP ACETAB PINNACLE SZ (Cup) ×1 IMPLANT
DERMABOND ADVANCED (GAUZE/BANDAGES/DRESSINGS) ×1
DERMABOND ADVANCED .7 DNX12 (GAUZE/BANDAGES/DRESSINGS) ×1 IMPLANT
DRAPE STERI IOBAN 125X83 (DRAPES) ×2 IMPLANT
DRAPE U-SHAPE 47X51 STRL (DRAPES) ×4 IMPLANT
DRESSING AQUACEL AG SP 3.5X10 (GAUZE/BANDAGES/DRESSINGS) ×1 IMPLANT
DRSG AQUACEL AG SP 3.5X10 (GAUZE/BANDAGES/DRESSINGS) ×2
DURAPREP 26ML APPLICATOR (WOUND CARE) ×2 IMPLANT
ELECT REM PT RETURN 15FT ADLT (MISCELLANEOUS) IMPLANT
ELECT REM PT RETURN 9FT ADLT (ELECTROSURGICAL) ×2
ELECTRODE REM PT RTRN 9FT ADLT (ELECTROSURGICAL) ×1 IMPLANT
ELIMINATOR HOLE APEX DEPUY (Hips) ×1 IMPLANT
GLOVE BIOGEL M STRL SZ7.5 (GLOVE) IMPLANT
GLOVE BIOGEL PI IND STRL 7.5 (GLOVE) ×1 IMPLANT
GLOVE BIOGEL PI IND STRL 8.5 (GLOVE) ×1 IMPLANT
GLOVE BIOGEL PI INDICATOR 7.5 (GLOVE) ×6
GLOVE BIOGEL PI INDICATOR 8.5 (GLOVE)
GLOVE ECLIPSE 8.0 STRL XLNG CF (GLOVE) ×6 IMPLANT
GLOVE ORTHO TXT STRL SZ7.5 (GLOVE) ×2 IMPLANT
GOWN STRL REUS W/TWL LRG LVL3 (GOWN DISPOSABLE) ×3 IMPLANT
GOWN STRL REUS W/TWL XL LVL3 (GOWN DISPOSABLE) ×3 IMPLANT
HOLDER FOLEY CATH W/STRAP (MISCELLANEOUS) ×2 IMPLANT
PACK ANTERIOR HIP CUSTOM (KITS) ×2 IMPLANT
PINNACLE ALTRX PLUS 4 N 36X56 (Hips) ×1 IMPLANT
PINNACLE SECTOR CUP 56MM (Hips) ×2 IMPLANT
SAW OSC TIP CART 19.5X105X1.3 (SAW) ×2 IMPLANT
SCREW 6.5MMX30MM (Screw) ×1 IMPLANT
SCREW PINN CAN BONE 6.5MMX15MM (Screw) ×1 IMPLANT
SPONGE LAP 18X18 X RAY DECT (DISPOSABLE) ×1 IMPLANT
STEM TRI LOC BPS GRIP SZ11 (Hips) IMPLANT
SUT MNCRL AB 4-0 PS2 18 (SUTURE) ×2 IMPLANT
SUT VIC AB 1 CT1 36 (SUTURE) ×6 IMPLANT
SUT VIC AB 2-0 CT1 27 (SUTURE) ×4
SUT VIC AB 2-0 CT1 TAPERPNT 27 (SUTURE) ×2 IMPLANT
SUT VLOC 180 0 24IN GS25 (SUTURE) ×2 IMPLANT
TRAY FOLEY W/METER SILVER 16FR (SET/KITS/TRAYS/PACK) ×1 IMPLANT
TRI LOC BPS W/GRIP SZ11 (Hips) ×2 IMPLANT
WATER STERILE IRR 1500ML POUR (IV SOLUTION) ×2 IMPLANT
YANKAUER SUCT BULB TIP 10FT TU (MISCELLANEOUS) IMPLANT

## 2015-11-23 NOTE — Discharge Instructions (Signed)

## 2015-11-23 NOTE — Anesthesia Postprocedure Evaluation (Signed)
Anesthesia Post Note  Patient: Jacob Mitchell  Procedure(s) Performed: Procedure(s) (LRB): LEFT TOTAL HIP ARTHROPLASTY ANTERIOR APPROACH (Left)  Patient location during evaluation: PACU Anesthesia Type: General Level of consciousness: awake Pain management: pain level controlled Vital Signs Assessment: post-procedure vital signs reviewed and stable Cardiovascular status: stable    Last Vitals:  Vitals:   11/23/15 1128  BP: 119/70  Pulse: 60  Resp: 18  Temp: 36.8 C    Last Pain:  Vitals:   11/23/15 1128  TempSrc: Oral                 EDWARDS,Paulita Licklider

## 2015-11-23 NOTE — Interval H&P Note (Signed)
History and Physical Interval Note:  11/23/2015 1:31 PM  Jacob Mitchell  has presented today for surgery, with the diagnosis of LEFT HIP OA  The various methods of treatment have been discussed with the patient and family. After consideration of risks, benefits and other options for treatment, the patient has consented to  Procedure(s): LEFT TOTAL HIP ARTHROPLASTY ANTERIOR APPROACH (Left) as a surgical intervention .  The patient's history has been reviewed, patient examined, no change in status, stable for surgery.  I have reviewed the patient's chart and labs.  Questions were answered to the patient's satisfaction.     Mauri Pole

## 2015-11-23 NOTE — Transfer of Care (Signed)
Immediate Anesthesia Transfer of Care Note  Patient: Jacob Mitchell  Procedure(s) Performed: Procedure(s): LEFT TOTAL HIP ARTHROPLASTY ANTERIOR APPROACH (Left)  Patient Location: PACU  Anesthesia Type:General  Level of Consciousness:  sedated, patient cooperative and responds to stimulation  Airway & Oxygen Therapy:Patient Spontanous Breathing and Patient connected to face mask oxgen  Post-op Assessment:  Report given to PACU RN and Post -op Vital signs reviewed and stable  Post vital signs:  Reviewed and stable  Last Vitals:  Vitals:   11/23/15 1128 11/23/15 1752  BP: 119/70   Pulse: 60 93  Resp: 18   Temp: [id number] C     Complications: No apparent anesthesia complications

## 2015-11-23 NOTE — Anesthesia Procedure Notes (Signed)
Procedure Name: Intubation Date/Time: 11/23/2015 1:45 PM Performed by: Cynda Familia Pre-anesthesia Checklist: Patient identified, Emergency Drugs available, Suction available and Patient being monitored Patient Re-evaluated:Patient Re-evaluated prior to inductionOxygen Delivery Method: Circle System Utilized Preoxygenation: Pre-oxygenation with 100% oxygen Intubation Type: IV induction Ventilation: Mask ventilation without difficulty Laryngoscope Size: Miller and 2 Grade View: Grade I Tube type: Oral Number of attempts: 1 Airway Equipment and Method: Stylet Placement Confirmation: ETT inserted through vocal cords under direct vision,  positive ETCO2 and breath sounds checked- equal and bilateral Secured at: 23 cm Tube secured with: Tape Dental Injury: Teeth and Oropharynx as per pre-operative assessment  Comments: Smooth IV induction Fitzgerald-- intubation AM CRNA atraumatic--- teeth and mouth  as preop- irregular surfaces front teeth -- bilat BS Fitzgerald.

## 2015-11-23 NOTE — Anesthesia Postprocedure Evaluation (Signed)
Anesthesia Post Note  Patient: Jacob Mitchell  Procedure(s) Performed: Procedure(s) (LRB): LEFT TOTAL HIP ARTHROPLASTY ANTERIOR APPROACH (Left)  Patient location during evaluation: PACU Anesthesia Type: General Level of consciousness: awake Pain management: pain level controlled Vital Signs Assessment: post-procedure vital signs reviewed and stable Respiratory status: spontaneous breathing Cardiovascular status: stable    Last Vitals:  Vitals:   11/23/15 2045 11/23/15 2100  BP: 120/73 103/69  Pulse: 82 66  Resp: 17 16  Temp: 36.7 C 36.5 C    Last Pain:  Vitals:   11/23/15 2045  TempSrc:   PainSc: 2                  EDWARDS,Barret Esquivel

## 2015-11-23 NOTE — Anesthesia Preprocedure Evaluation (Addendum)
Anesthesia Evaluation  Patient identified by MRN, date of birth, ID band Patient awake    Reviewed: Allergy & Precautions, NPO status , Patient's Chart, lab work & pertinent test results  Airway Mallampati: II  TM Distance: >3 FB Neck ROM: Full    Dental  (+) Dental Advisory Given   Pulmonary neg pulmonary ROS,    breath sounds clear to auscultation       Cardiovascular negative cardio ROS   Rhythm:Regular Rate:Normal     Neuro/Psych negative neurological ROS     GI/Hepatic negative GI ROS, Neg liver ROS,   Endo/Other  negative endocrine ROS  Renal/GU negative Renal ROS     Musculoskeletal  (+) Arthritis ,   Abdominal   Peds  Hematology negative hematology ROS (+)   Anesthesia Other Findings   Reproductive/Obstetrics                            Lab Results  Component Value Date   WBC 6.8 11/16/2015   HGB 15.0 11/16/2015   HCT 45.2 11/16/2015   MCV 88.3 11/16/2015   PLT 211 11/16/2015    Anesthesia Physical Anesthesia Plan  ASA: II  Anesthesia Plan: General   Post-op Pain Management:    Induction: Intravenous  Airway Management Planned: Oral ETT  Additional Equipment:   Intra-op Plan:   Post-operative Plan: Extubation in OR  Informed Consent: I have reviewed the patients History and Physical, chart, labs and discussed the procedure including the risks, benefits and alternatives for the proposed anesthesia with the patient or authorized representative who has indicated his/her understanding and acceptance.   Dental advisory given  Plan Discussed with:   Anesthesia Plan Comments:        Anesthesia Quick Evaluation

## 2015-11-24 ENCOUNTER — Encounter (HOSPITAL_COMMUNITY): Payer: Self-pay

## 2015-11-24 LAB — CBC
HCT: 29.5 % — ABNORMAL LOW (ref 39.0–52.0)
Hemoglobin: 9.9 g/dL — ABNORMAL LOW (ref 13.0–17.0)
MCH: 29.6 pg (ref 26.0–34.0)
MCHC: 33.6 g/dL (ref 30.0–36.0)
MCV: 88.1 fL (ref 78.0–100.0)
Platelets: 195 10*3/uL (ref 150–400)
RBC: 3.35 MIL/uL — ABNORMAL LOW (ref 4.22–5.81)
RDW: 13.1 % (ref 11.5–15.5)
WBC: 12.3 10*3/uL — ABNORMAL HIGH (ref 4.0–10.5)

## 2015-11-24 LAB — BASIC METABOLIC PANEL
Anion gap: 7 (ref 5–15)
BUN: 26 mg/dL — ABNORMAL HIGH (ref 6–20)
CO2: 23 mmol/L (ref 22–32)
Calcium: 8.1 mg/dL — ABNORMAL LOW (ref 8.9–10.3)
Chloride: 104 mmol/L (ref 101–111)
Creatinine, Ser: 1.15 mg/dL (ref 0.61–1.24)
GFR calc Af Amer: >60 mL/min (ref >60)
GFR calc non Af Amer: >60 mL/min (ref >60)
Glucose, Bld: 152 mg/dL — ABNORMAL HIGH (ref 65–99)
Potassium: 4.5 mmol/L (ref 3.5–5.1)
Sodium: 134 mmol/L — ABNORMAL LOW (ref 135–145)

## 2015-11-24 LAB — GLUCOSE, CAPILLARY: Glucose-Capillary: 131 mg/dL — ABNORMAL HIGH (ref 65–99)

## 2015-11-24 MED ORDER — INFLUENZA VAC SPLIT QUAD 0.5 ML IM SUSY
0.5000 mL | PREFILLED_SYRINGE | INTRAMUSCULAR | Status: AC
  Administered 2015-11-25: 0.5 mL via INTRAMUSCULAR
  Filled 2015-11-24: qty 0.5

## 2015-11-24 NOTE — Brief Op Note (Signed)
11/23/2015  5:47 AM  PATIENT:  Jacob Mitchell  70 y.o. male  PRE-OPERATIVE DIAGNOSIS:  LEFT HIP OSTEOARTHRITIS  POST-OPERATIVE DIAGNOSIS:  LEFT HIP OSTEOARTHRITIS  PROCEDURE:  Procedure(s): LEFT TOTAL HIP ARTHROPLASTY ANTERIOR APPROACH (Left)  SURGEON:  Surgeon(s) and Role:    * Paralee Cancel, MD - Primary  PHYSICIAN ASSISTANT: Molli Barrows, PA-C  ASSISTANTS: Surgical team   ANESTHESIA:   general  EBL:  Total I/O In: [id number] [P.O.:760; I.V.:450] Out: 650 [Urine:600; Emesis/NG output:50]  1500cc EBL  BLOOD ADMINISTERED:none  DRAINS: none   LOCAL MEDICATIONS USED:  NONE  SPECIMEN:  No Specimen  DISPOSITION OF SPECIMEN:  N/A  COUNTS:  YES  TOURNIQUET:  * No tourniquets in log *  DICTATION: .Other Dictation: Dictation Number Dictated but no number provided  PLAN OF CARE: Admit to inpatient   PATIENT DISPOSITION:  PACU - hemodynamically stable.   Delay start of Pharmacological VTE agent (>24hrs) due to surgical blood loss or risk of bleeding: no

## 2015-11-24 NOTE — Progress Notes (Signed)
OT Cancellation Note  Patient Details Name: Jacob Mitchell MRN: [id number] DOB: 01-08-1946   Cancelled Treatment:    OT role explained. Pt just back to bed- will provide OT education in the morning per pt request. Kari Baars, [place] [name] D 11/24/2015, 1:21 PM

## 2015-11-24 NOTE — Progress Notes (Signed)
   11/24/15 0937  PT Visit Information  Last PT Received On 11/24/15  Assistance Needed +1  History of Present Illness 70 yo male s/p L THA-direct anterior 11/23/15.  Precautions  Precautions Fall  Restrictions  Weight Bearing Restrictions No  LLE Weight Bearing WBAT  Home Living  Family/patient expects to be discharged to: Private residence  Living Arrangements Spouse/significant other;Children  Available Help at Discharge Family  Type of [place] to enter  Entrance Stairs-Number of Steps 3  Entrance Stairs-Rails Left  [place] One level  [place] - 2 wheels;BSC  Prior Function  Level of Independence Independent  Communication  Communication No difficulties  Pain Assessment  Pain Assessment No/denies pain  Cognition  Arousal/Alertness Awake/alert  Behavior During Therapy WFL for tasks assessed/performed  Overall Cognitive Status Within Functional Limits for tasks assessed  Lower Extremity Assessment  Lower Extremity Assessment Generalized weakness  Cervical / Trunk Assessment  Cervical / Trunk Assessment Normal  Bed Mobility  Overal bed mobility Needs Assistance  Bed Mobility Supine to Sit  Supine to sit Min assist  General bed mobility comments Assist for L LE.   Transfers  Overall transfer level Needs assistance  Equipment used Rolling walker (2 wheeled)  Sit to Stand Min guard;From elevated surface  General transfer comment close guard for safety. VCs safety, hand/LE placement.   Ambulation/Gait  Ambulation/Gait assistance Min assist  Ambulation Distance (Feet) 140 Feet  Assistive device Rolling walker (2 wheeled)  Gait Pattern/deviations Step-through pattern;Decreased stride length  General Gait Details Intermittent assist to stabilize. Pt denied dizziness/lightheadedness.   Total Joint Exercises  Ankle Circles/Pumps AROM;Both;10 reps;Supine  Quad Sets AROM;Both;10 reps;Supine  Heel Slides AAROM;Left;10 reps;Supine  Hip  ABduction/ADduction AAROM;Left;10 reps;Supine  PT - End of Session  Equipment Utilized During Treatment Gait belt  Activity Tolerance Patient tolerated treatment well  Patient left in chair;with call bell/phone within reach;with family/visitor present  PT Assessment  PT Recommendation/Assessment Patient needs continued PT services  PT Problem List Decreased strength;Decreased mobility;Decreased range of motion;Decreased activity tolerance;Decreased balance;Pain;Decreased knowledge of use of DME  PT Plan  PT Frequency (ACUTE ONLY) 7X/week  PT Treatment/Interventions (ACUTE ONLY) DME instruction;Gait training;Therapeutic activities;Therapeutic exercise;Functional mobility training;Balance training;Patient/family education  PT Recommendation  Follow Up Recommendations No PT follow up  PT equipment None recommended by PT  Individuals Consulted  Consulted and Agree with Results and Recommendations Patient  Acute Rehab PT Goals  Patient Stated Goal regain independence  PT Goal Formulation With patient  Time For Goal Achievement 12/08/15  Potential to Achieve Goals Good  PT Time Calculation  PT Start Time (ACUTE ONLY) [id number]  PT Stop Time (ACUTE ONLY) 0936  PT Time Calculation (min) (ACUTE ONLY) 18 min  PT General Charges  $$ ACUTE PT VISIT 1 Procedure  PT Evaluation  $PT Eval Low Complexity 1 Procedure   Weston Anna, MPT [phone]

## 2015-11-24 NOTE — Progress Notes (Signed)
Patient ID: Reg Uhland, male   DOB: 10/16/1945, 70 y.o.   MRN: [id number]   Subjective: 1 Day Post-Op Procedure(s) (LRB): LEFT TOTAL HIP ARTHROPLASTY ANTERIOR APPROACH (Left)    Patient reports pain as mild.  Doing very well despite the longer than anticipated procedure yesterday.  Wonderful attitude.  Been up already.  Moving left leg around in bed.  No events  Objective:   VITALS:   Vitals:   11/24/15 0113 11/24/15 0455  BP: (!) 97/51 (!) 99/56  Pulse: 80 74  Resp: 16 15  Temp: 98.6 F (37 C) 99.3 F (37.4 C)    Neurovascular intact Incision: dressing C/D/I  LABS  Recent Labs  11/23/15 1653 11/24/15 0513  HGB 11.2* 9.9*  HCT 33.0* 29.5*  WBC  --  12.3*  PLT  --  195     Recent Labs  11/23/15 1653 11/24/15 0513  NA 139 134*  K 4.2 4.5  BUN  --  26*  CREATININE  --  1.15  GLUCOSE 130* 152*    No results for input(s): LABPT, INR in the last 72 hours.   Assessment/Plan: 1 Day Post-Op Procedure(s) (LRB): LEFT TOTAL HIP ARTHROPLASTY ANTERIOR APPROACH (Left)   Advance diet Up with therapy Plan for discharge tomorrow   Will monitor progress today ABLA - maintain IVF, bolus as needed for volume CBC in am  Reviewed intraoperative findings and procedure, will explain further when X-rayed in office in follow up

## 2015-11-24 NOTE — Progress Notes (Addendum)
Physical Therapy Treatment Patient Details Name: Jacob Mitchell MRN: [id number] DOB: [birth year] Today's Date: 11/24/2015    History of Present Illness 70 yo male s/p L THA-direct anterior 11/23/15.    PT Comments    Pt continues to participate well with therapy. Will plan to practice stair negotiation on tomorrow.   Follow Up Recommendations  No PT follow up;Supervision for mobility/OOB     Equipment Recommendations  None recommended by PT    Recommendations for Other Services       Precautions / Restrictions Precautions Precautions: Fall Restrictions Weight Bearing Restrictions: No LLE Weight Bearing: Weight bearing as tolerated    Mobility  Bed Mobility Overal bed mobility: Needs Assistance Bed Mobility: Supine to Sit;Sit to Supine   Supine to sit: Min assist Sit to supine: Min assist     General bed mobility comments: Assist for L LE.   Transfers Overall transfer level: Needs assistance Equipment used: Rolling walker (2 wheeled) Transfers: Sit to/from Stand Sit to Stand: Min guard;From elevated surface         General transfer comment: close guard for safety. VCs safety, hand/LE placement.   Ambulation/Gait Ambulation/Gait assistance: Min guard Ambulation Distance (Feet): 145 Feet Assistive device: Rolling walker (2 wheeled) Gait Pattern/deviations: Step-through pattern;Decreased stride length     General Gait Details: close guard for safety. Pt denied dizziness/lightheadedness.    Stairs            Wheelchair Mobility    Modified Rankin (Stroke Patients Only)       Balance                                    Cognition Arousal/Alertness: Awake/alert Behavior During Therapy: WFL for tasks assessed/performed Overall Cognitive Status: Within Functional Limits for tasks assessed                      Exercises Total Joint Exercises Long Arc Quad: AROM;Both;Seated    General Comments        Pertinent  Vitals/Pain Pain Assessment: No/denies pain    Home Living                      Prior Function            PT Goals (current goals can now be found in the care plan section) Progress towards PT goals: Progressing toward goals    Frequency    7X/week      PT Plan Current plan remains appropriate    Co-evaluation             End of Session Equipment Utilized During Treatment: Gait belt Activity Tolerance: Patient tolerated treatment well Patient left: in bed;with call bell/phone within reach;with family/visitor present     Time: [id number] PT Time Calculation (min) (ACUTE ONLY): 16 min  Charges:  $Gait Training: 8-22 mins                    G Codes:      Weston Anna, MPT Pager: [phone]

## 2015-11-25 LAB — CBC
HCT: 23.8 % — ABNORMAL LOW (ref 39.0–52.0)
Hemoglobin: 8.2 g/dL — ABNORMAL LOW (ref 13.0–17.0)
MCH: 29.1 pg (ref 26.0–34.0)
MCHC: 34.5 g/dL (ref 30.0–36.0)
MCV: 84.4 fL (ref 78.0–100.0)
Platelets: 149 10*3/uL — ABNORMAL LOW (ref 150–400)
RBC: 2.82 MIL/uL — ABNORMAL LOW (ref 4.22–5.81)
RDW: 13.1 % (ref 11.5–15.5)
WBC: 12.5 10*3/uL — ABNORMAL HIGH (ref 4.0–10.5)

## 2015-11-25 LAB — BASIC METABOLIC PANEL
Anion gap: 3 — ABNORMAL LOW (ref 5–15)
BUN: 30 mg/dL — ABNORMAL HIGH (ref 6–20)
CO2: 26 mmol/L (ref 22–32)
Calcium: 8.2 mg/dL — ABNORMAL LOW (ref 8.9–10.3)
Chloride: 107 mmol/L (ref 101–111)
Creatinine, Ser: 1.07 mg/dL (ref 0.61–1.24)
GFR calc Af Amer: >60 mL/min (ref >60)
GFR calc non Af Amer: >60 mL/min (ref >60)
Glucose, Bld: 129 mg/dL — ABNORMAL HIGH (ref 65–99)
Potassium: 4.3 mmol/L (ref 3.5–5.1)
Sodium: 136 mmol/L (ref 135–145)

## 2015-11-25 MED ORDER — HYDROCODONE-ACETAMINOPHEN 7.5-325 MG PO TABS: 1.0000 | ORAL_TABLET | ORAL | 0 refills | Status: DC

## 2015-11-25 MED ORDER — FERROUS SULFATE 325 (65 FE) MG PO TABS: 325.0000 mg | ORAL_TABLET | Freq: Three times a day (TID) | ORAL | 3 refills | Status: DC

## 2015-11-25 MED ORDER — METHOCARBAMOL 500 MG PO TABS: 500.0000 mg | ORAL_TABLET | Freq: Three times a day (TID) | ORAL | 1 refills | Status: DC | PRN

## 2015-11-25 NOTE — Care Management Important Message (Signed)
Important Message  Patient Details  Name: Jacob Mitchell MRN: [id number] Date of Birth: 1945/07/15   Medicare Important Message Given:  Yes    Erenest Rasher, RN 11/25/2015, 11:25 AM

## 2015-11-25 NOTE — Evaluation (Signed)
   Occupational Therapy Evaluation Patient Details Name: Jacob Mitchell MRN: [id number] DOB: [birth year] Today's Date: 12-08-15    History of Present Illness 70 yo male s/p L THA-direct anterior 11/23/15.   Clinical Impression   Education complete regarding ADL activity s/p THA       Equipment Recommendations  None recommended by OT    Recommendations for Other Services       Precautions / Restrictions Precautions Precautions: Fall Restrictions Weight Bearing Restrictions: No LLE Weight Bearing: Weight bearing as tolerated      Mobility Bed Mobility               General bed mobility comments: pt in chair  Transfers Overall transfer level: Needs assistance Equipment used: Rolling walker (2 wheeled) Transfers: Sit to/from [facility] Sit to Stand: Min guard Stand pivot transfers: Min guard       General transfer comment: close guard for safety. VCs safety, hand/LE placement.          ADL Overall ADL's : Needs assistance/impaired             Lower Body Bathing: Minimal assistance;Sit to/from stand;Cueing for sequencing;Cueing for compensatory techniques;With caregiver independent assisting   Upper Body Dressing : Set up;Sitting   Lower Body Dressing: Minimal assistance;Sit to/from stand;Cueing for safety;Cueing for sequencing;Cueing for compensatory techniques   Toilet Transfer: Min Fish farm manager Details (indicate cue type and reason): sit to stand only-pt pulled up on walker Toileting- Water quality scientist and Hygiene: Min guard;Sit to/from stand;Cueing for safety       Functional mobility during ADLs: Min guard General ADL Comments: instructed to use walker at all times               Pertinent Vitals/Pain Pain Assessment: No/denies pain              Cognition Arousal/Alertness: Awake/alert Behavior During Therapy: WFL for tasks assessed/performed Overall Cognitive Status: Within Functional Limits for  tasks assessed                     General Comments    Education complete. Family will A as needed. Pt has a 3 n 1 and sock aide                                 End of Session Equipment Utilized During Treatment: Rolling walker Nurse Communication: Mobility status  Activity Tolerance: Patient tolerated treatment well Patient left: in chair;with call bell/phone within reach   Time: 0945-1001 OT Time Calculation (min): 16 min Charges:  OT General Charges $OT Visit: 1 Procedure OT Evaluation $OT Eval Moderate Complexity: 1 Procedure G-Codes:    [name] D 12-08-2015, 11:02 AM

## 2015-11-25 NOTE — Care Management Note (Signed)
Case Management Note  Patient Details  Name: Denard Mehl MRN: [id number] Date of Birth: 1945/06/09  Subjective/Objective:    Left Total Hip Arthroplasty                Action/Plan: Discharge Planning: AVS reviewed:  NCM spoke to pt and states he has RW and 3n1 bedside commode at home. Pt states wife is at home to assist with his care. No PT recommended for home. Pt declines HH PT at this time.    Expected Discharge Date:  11/25/2015              Expected Discharge Plan:  Home/Self Care  In-House Referral:  NA  Discharge planning Services  CM Consult  Post Acute Care Choice:  NA Choice offered to:  NA  DME Arranged:  N/A DME Agency:  NA  HH Arranged:  NA HH Agency:  NA  Status of Service:  Completed, signed off  If discussed at [place] of Stay Meetings, dates discussed:    Additional Comments:  Erenest Rasher, RN 11/25/2015, 9:32 AM

## 2015-11-25 NOTE — Progress Notes (Signed)
Physical Therapy Treatment Patient Details Name: Jacob Mitchell MRN: [id number] DOB: 11-17-1945 Today's Date: 11/25/2015    History of Present Illness 70 yo male s/p L THA-direct anterior 11/23/15.    PT Comments    Progressing well with mobility. Reviewed/practiced exercises, gait training, and stair training. All education completed. No questions/concerns from pt/wife. Ready to d/c from PT standpoint.   Follow Up Recommendations  No PT follow up;Supervision for mobility/OOB     Equipment Recommendations       Recommendations for Other Services       Precautions / Restrictions Precautions Precautions: Fall Restrictions Weight Bearing Restrictions: No LLE Weight Bearing: Weight bearing as tolerated    Mobility  Bed Mobility               General bed mobility comments: oob in recliner  Transfers Overall transfer level: Needs assistance Equipment used: Rolling walker (2 wheeled) Transfers: Sit to/from Stand Sit to Stand: Min guard         General transfer comment: close guard for safety. VCs safety, hand/LE placement.   Ambulation/Gait Ambulation/Gait assistance: Min guard Ambulation Distance (Feet): 150 Feet Assistive device: Rolling walker (2 wheeled) Gait Pattern/deviations: Step-through pattern;Decreased stride length;Trunk flexed     General Gait Details: close guard for safety. Pt denied dizziness/lightheadedness.    Stairs Stairs: Yes Stairs assistance: Min guard Stair Management: Step to pattern;Sideways;One rail Left Number of Stairs: 2 General stair comments: VCs safety, technique, sequence. Close guard for safety  Wheelchair Mobility    Modified Rankin (Stroke Patients Only)       Balance                                    Cognition Arousal/Alertness: Awake/alert Behavior During Therapy: WFL for tasks assessed/performed Overall Cognitive Status: Within Functional Limits for tasks assessed                       Exercises Total Joint Exercises Hip ABduction/ADduction: AROM;Left;10 reps;Standing Long Arc Quad: Left;Right;10 reps;Seated Knee Flexion: AROM;Left;10 reps;Standing Marching in Standing: AROM;Both;10 reps;Standing General Exercises - Lower Extremity Heel Raises: AROM;Both;10 reps;Standing    General Comments        Pertinent Vitals/Pain Pain Assessment: No/denies pain    Home Living                      Prior Function            PT Goals (current goals can now be found in the care plan section) Progress towards PT goals: Progressing toward goals    Frequency    7X/week      PT Plan Current plan remains appropriate    Co-evaluation             End of Session Equipment Utilized During Treatment: Gait belt Activity Tolerance: Patient tolerated treatment well Patient left: in chair;with call bell/phone within reach;with family/visitor present     Time: [id number] PT Time Calculation (min) (ACUTE ONLY): 10 min  Charges:  $Gait Training: 8-22 mins                    G Codes:      Jacob Mitchell, MPT Pager: [phone]

## 2015-11-25 NOTE — Progress Notes (Signed)
Subjective: 2 Days Post-Op Procedure(s) (LRB): LEFT TOTAL HIP ARTHROPLASTY ANTERIOR APPROACH (Left) Patient reports pain as well controlled.  Progressing with PT. Tolerating PO's. Denies SOb, CP, or calf pain.  Objective: Vital signs in last 24 hours: Temp:  [97.9 F (36.6 C)-99.3 F (37.4 C)] 97.9 F (36.6 C) (10/15 0515) Pulse Rate:  [65-83] 65 (10/15 0515) Resp:  [16-18] 16 (10/15 0515) BP: (96-116)/(34-63) 96/52 (10/15 0515) SpO2:  [97 %-100 %] 97 % (10/15 0515)  Intake/Output from previous day: 10/14 0701 - 10/15 0700 In: 1200 [P.O.:1200] Out: 1550 [Urine:1550] Intake/Output this shift: No intake/output data recorded.   Recent Labs  11/23/15 1653 11/24/15 0513 11/25/15 0413  HGB 11.2* 9.9* 8.2*    Recent Labs  11/24/15 0513 11/25/15 0413  WBC 12.3* 12.5*  RBC 3.35* 2.82*  HCT 29.5* 23.8*  PLT 195 149*    Recent Labs  11/24/15 0513 11/25/15 0413  NA 134* 136  K 4.5 4.3  CL 104 107  CO2 23 26  BUN 26* 30*  CREATININE 1.15 1.07  GLUCOSE 152* 129*  CALCIUM 8.1* 8.2*   No results for input(s): LABPT, INR in the last 72 hours.  Alert and oriented x3. RRR, Lungs clear, BS x4. Left Calf soft and non tender. L hip dressing C/D/I. No DVT signs. No signs of infection or compartment syndrome. LLE grossly neurovascularly intact.   Assessment/Plan: 2 Days Post-Op Procedure(s) (LRB): LEFT TOTAL HIP ARTHROPLASTY ANTERIOR APPROACH (Left) D/c Home Iron TId. Follow instructions F/u in office  Seen by Dr.Olin  Jarvis Morgan, Jermia Rigsby L 11/25/2015, 8:21 AM

## 2015-11-25 NOTE — Op Note (Signed)
NAMEDRESEAN, HUTTON NO.:  [id number]  MEDICAL RECORD NO.:  [id number]  LOCATION:  [id number]                         FACILITY:  [facility]  PHYSICIAN:  Pietro Cassis. Alvan Dame, M.D.  DATE OF BIRTH:  01/02/1946  DATE OF PROCEDURE: DATE OF DISCHARGE:                              OPERATIVE REPORT   PREOPERATIVE DIAGNOSIS:  Advanced left hip osteoarthritis.  POSTOPERATIVE DIAGNOSIS:  Advanced left hip osteoarthritis.  PROCEDURE:  Left total hip arthroplasty.  COMPONENTS USED:  DePuy hip system with a size 56 revision acetabular shell, a size 32+4, 10 degree lipped liner and size 12 High offset Tri-Lock stem and a 32+13 Articuleze metal hip ball.  SURGEON:  Pietro Cassis. Alvan Dame, M.D.  ASSISTANTS:  Judith Part. Chabon, PA-C  ANESTHESIA:  General.  FINDINGS:  Please see the body of the dictated op note for findings intraoperatively.  COMPLICATIONS:  None apparent other than duration of case.  BLOOD LOSS:  About 1500 mL.  BLOOD ADMINISTERED:  None.  DRAINS:  None.  INDICATIONS FOR PROCEDURE:  Mr. 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  He had progressive and significant loss of his overall function over the past few months.  Radiographs revealed that he had end- stage left hip osteoarthritis with complete loss of joint space, periarticular osteophytes particularly medially with lateralization of his femur.  After reviewing the risks and benefits of the surgical treatment, he wished to proceed with total hip arthroplasty for improved quality life and function.  Risks of infection, DVT, component failure, need for future surgeries all discussed and reviewed.  Consent was obtained.  PROCEDURE IN DETAIL:  The patient was brought to the operative theater. Once adequate anesthesia, preoperative antibiotics, Ancef, tranexamic acid, and Decadron administered, he was positioned supine on the OSI [place]  table.  Once adequately positioned, including making certain that his right elbow was comfortable based on the flexion contracture as well as all other bony prominences.  His left hip was pre-draped.  Once fluoroscopy was positioned appropriately for hip and pelvis x-rays, his left lower extremities prepped and draped in sterile fashion.  A time-out was performed identifying the patient, planned procedure, and extremity.  At this point, routine surgical approach was carried out.  An incision was made 2 cm lateral to his anterior superior iliac spine.  Skin incision was taken down to the fascia of the tensor fascia lata muscle.  The fascia was then incised and the tensor fascia lata muscle swept laterally and retractors were placed on the superior neck and inferior neck extracapsular.  Once the pericapsular fat and circumflex vessels were cauterized, a T capsulotomy was performed preserving the capsular tissues with tag sutures.  Retractors were then placed intra-articularly.  At this point, traction was applied to the left lower extremity and locked.  A neck osteotomy was made from the trochanteric fossa towards the medial calcar.  The patient's femoral head was removed and found to be severely arthritic as noted by radiographs.  At this point, further routine exposure of the acetabulum was carried out.  The retractors were placed along the  anterior acetabulum, inferior and posterior.  I removed pericapsular labral tissue that was remaining as well as some bony osteophytes.  At the time, I began reaming with a 50 mm reamer and reamed to 52mm with great boney bed preparation.  I selected a 56 Pinnacle shell.  At this point, I initially placed a 56 mm Pinnacle shell.  This was followed by the placement of 1 single screw into the ilium and a final 36+ 4 neutral AltrX liner.  Again, this was being performed in a routine fashion.  The cup was positioned in an anatomic position  initially with about 15-20 degrees of anteversion, confirmed radiographically as we went through the procedure.  Once the cup was placed, we now attended to the femur.  The femur was positioned neutrally and the femoral hook was placed inferior to the vastus lateralis origin.  The femur was then rolled to 100 degrees and retractors were placed.  At this point, the femur was extended and adducted.  With the femur in its appropriate position, we identified landmarks and released soft tissues appropriately from the medial aspect of the greater trochanter in the inferior neck.  I was able to identify the trochanteric fossa at the piriformis insertion.  Then used a box osteotome and opened up the proximal femur and broached up.  Initially, I broached up to a size 11 stem, which sat at the level of the neck.  With this now positioned, a trial reduction was carried out with various head sizes.  With a high offset neck and initially 36+ 5 ball, I found that the hip was easily subluxcable at this point.  Up to this point the procedure was performed in routine fashion with nothing out of the ordinary encountered. I spent a lot of time evaluating the anteversion of the femoral component, acetabular component.  What I determined at this point, unfortunately, was based on the patient's anatomic size, his head and neck length that it had adequate soft tissue tension and this was thus causing problems with his subluxation.  At this point, I made a decision to make some significant changes.  The first thing I did was remove the trial components.  I made a decision at this point to remove the patient's previously placed acetabular component.  I did this because I was going to place a revision component which would give me 6 mm of further offset to further lateralize his acetabular center.  The previously placed polyethylene liner was removed.  The screws were removed and the acetabular shell  removed.  We then opened up the 56 revision pinnacle shell and impacted this under fluoroscopic imaging again into appropriate anatomic position.  At this point, instead of placing the final liner, I did place trial liners.  I selected the initially a 32+4, 10 degree liner and placed the lipped portion anterior to provide further stability to try to prevent anterior subluxation just based on soft tissue tensioning.  With the trial liner in place again we did several more trial reductions.  I re-evaluated the orientation of the femoral component, though it appeared anatomically positioned from prior experience.  With this, I ended up broaching up to a size 12, trying to keep it a little bit proud to provide soft tissue tensions for lengthening and selected up to a 32+ 13 ball.  With this system in place, the hip reduced and felt much more stable.  Given all these findings, I removed the trial components.  We ended up  selecting the 32+4, 10 degree lip liner in place and into the acetabular shell again with the anterior lip in the 9 o'clock position for this left hip.  Thus, on the anterior side of the pelvis.  I then selected a size 12 high Tri-Lock stem.  The following replacement of the femur into its appropriate position.  A size 12 Tri-Lock stem was then impacted and sat a little bit more proud than my neck cut, which was determined from our trial reductions.  Based on this, I selected a 32+ 13 ball.  With this, I felt I was able to recreate the offset.  Another interesting concept was evaluated.  His preoperative radiographs in terms of leg lengths.  His left hip despite the advanced arthritis appeared to be longer than his right hip.  We thus tried to match that anatomically with the postoperative radiographs and thus the reason for the 13 head.  At this point I felt the combination of a high offset neck, the 13 head as well as this revision acetabular shell and offset liner that  I was able at this point to restore his offset and length, but most important with providing a stable hip to prevent any subluxation or dislocation.  This represented a very complex primary procedure.  He did lose at least 1500 mL of blood.  There was no active bleeding.  This was only bleeding from the exposed bone surfaces.  Once the procedure was completed and the final components in place, I irrigated the hip throughout the case. I did administer another 1 gm of tranexamic acid to help with perioperative blood loss.  The pericapsular tissues were reapproximated using #1 Vicryl.  The tensor fascia lata muscle was reapproximated using #1 Vicryl and 0 V-Loc suture.  The remainder wound was closed routinely with 2-0 Vicryl and 4-0 running Monocryl.  The hip was cleaned, dried, and dressed sterilely with an Aquacel dressing.  Following the procedure, the patient was woken from anesthesia brought to the recovery room in stable condition.  At this point, there were no plans for any blood transfusions.  I reviewed the findings with this family.  At this point, I will still plan on him being weightbearing as tolerated.  We will check his progress in the postop period.  PLAN:  He will be in the hospital for probably 2 days to monitor his blood pressure instability given the complicated nature of the procedure.     Pietro Cassis Alvan Dame, M.D.     MDO/MEDQ  D:  11/24/2015  T:  11/25/2015  Job:  [id number]

## 2015-11-26 ENCOUNTER — Encounter (HOSPITAL_COMMUNITY): Payer: Self-pay | Admitting: Orthopedic Surgery

## 2015-11-27 ENCOUNTER — Encounter (HOSPITAL_COMMUNITY): Payer: Self-pay | Admitting: Orthopedic Surgery

## 2015-11-27 NOTE — Discharge Summary (Signed)
Physician Discharge Summary  Patient ID: Jacob Mitchell MRN: [id number] DOB/AGE: [birth year] 70 y.o.  Admit date: 11/23/2015 Discharge date: 11/27/2015  Admission Diagnoses:  Discharge Diagnoses:  Principal Problem:   S/P left THA, AA   Discharged Condition: good  Hospital Course:  Jacob Mitchell is a 70 y.o. who was admitted to [facility]. They were brought to the operating room on 11/23/2015 and underwent Procedure(s): LEFT TOTAL HIP ARTHROPLASTY ANTERIOR APPROACH.  Patient tolerated the procedure well and was later transferred to the recovery room and then to the orthopaedic floor for postoperative care.  They were given PO and IV analgesics for pain control following their surgery.  They were given 24 hours of postoperative antibiotics of  Anti-infectives    Start     Dose/Rate Route Frequency Ordered Stop   11/23/15 2300  ceFAZolin (ANCEF) IVPB 2g/100 mL premix     2 g 200 mL/hr over 30 Minutes Intravenous Every 6 hours 11/23/15 2117 11/24/15 0518   11/23/15 0600  ceFAZolin (ANCEF) 3 g in dextrose 5 % 50 mL IVPB     3 g 130 mL/hr over 30 Minutes Intravenous On call to O.R. 11/22/15 1350 11/23/15 1400     and started on DVT prophylaxis.   PT and OT were ordered l.  Discharge planning consulted to help with postop disposition and equipment needs.  Patient had a good night on the evening of surgery and started to get up OOB with therapy on day one.    Continued to work with therapy into day two.  Dressing was with normal limits.  The patient had progressed with therapy and meeting their goals. Patient was seen in rounds and was ready to go home. Consults: n/a  Significant Diagnostic Studies: routine  Treatments: routine  Discharge Exam: Blood pressure (!) 123/45, pulse 73, temperature 98.1 F (36.7 C), temperature source Oral, resp. rate 15, height 6\' 5"  (1.956 m), weight 129.3 kg (285 lb), SpO2 96 %. Alert and oriented x3. RRR, Lungs clear, BS x4. Left Calf soft and non  tender. L hip dressing C/D/I. No DVT signs. No signs of infection or compartment syndrome. LLE grossly neurovascularly intact.  Disposition: 01-Home or Self Care  Discharge Instructions    Call MD / Call 911    Complete by:  As directed    If you experience chest pain or shortness of breath, CALL 911 and be transported to the hospital emergency room.  If you develope a fever above 101 F, pus (white drainage) or increased drainage or redness at the wound, or calf pain, call your surgeon's office.   Constipation Prevention    Complete by:  As directed    Drink plenty of fluids.  Prune juice may be helpful.  You may use a stool softener, such as Colace (over the counter) 100 mg twice a day.  Use MiraLax (over the counter) for constipation as needed.   Diet - low sodium heart healthy    Complete by:  As directed    Increase activity slowly as tolerated    Complete by:  As directed        Medication List    TAKE these medications   aspirin 81 MG chewable tablet Chew 1 tablet (81 mg total) by mouth daily. Take for 4 weeks.   docusate sodium 100 MG capsule Commonly known as:  COLACE Take 1 capsule (100 mg total) by mouth 2 (two) times daily. To help treat constipation.   ferrous sulfate 325 (65 FE)  MG tablet Commonly known as:  FERROUSUL Take 1 tablet (325 mg total) by mouth 3 (three) times daily with meals.   ferrous sulfate 325 (65 FE) MG tablet Take 1 tablet (325 mg total) by mouth 3 (three) times daily after meals.   HYDROcodone-acetaminophen 7.5-325 MG tablet Commonly known as:  NORCO Take 1-2 tablets by mouth every 4 (four) hours as needed for moderate pain.   HYDROcodone-acetaminophen 7.5-325 MG tablet Commonly known as:  NORCO Take 1-2 tablets by mouth every 4 (four) hours.   methocarbamol 500 MG tablet Commonly known as:  ROBAXIN Take 1 tablet (500 mg total) by mouth every 6 (six) hours as needed for muscle spasms.   methocarbamol 500 MG tablet Commonly known as:   ROBAXIN Take 1 tablet (500 mg total) by mouth every 8 (eight) hours as needed for muscle spasms.   polyethylene glycol packet Commonly known as:  MIRALAX Take 17 g by mouth 2 (two) times daily. To help treat constipation.   traZODone 50 MG tablet Commonly known as:  DESYREL Take 50 mg by mouth at bedtime.   Vitamin D3 1000 units Caps Take 3,000 Units by mouth daily.      Follow-up Information    Mauri Pole, MD. Schedule an appointment as soon as possible for a visit in 2 week(s).   Specialty:  Orthopedic Surgery Contact information: [address] [place] [address] [id number]           Signed: Lajean Manes 11/27/2015, 3:24 PM

## 2015-11-29 DIAGNOSIS — Z471 Aftercare following joint replacement surgery: Secondary | ICD-10-CM | POA: Diagnosis not present

## 2015-11-29 DIAGNOSIS — Z96642 Presence of left artificial hip joint: Secondary | ICD-10-CM | POA: Diagnosis not present

## 2015-11-30 ENCOUNTER — Encounter (HOSPITAL_COMMUNITY): Payer: Self-pay | Admitting: Orthopedic Surgery

## 2015-12-07 DIAGNOSIS — Z471 Aftercare following joint replacement surgery: Secondary | ICD-10-CM | POA: Diagnosis not present

## 2015-12-07 DIAGNOSIS — Z96642 Presence of left artificial hip joint: Secondary | ICD-10-CM | POA: Diagnosis not present

## 2016-01-02 DIAGNOSIS — Z96642 Presence of left artificial hip joint: Secondary | ICD-10-CM | POA: Diagnosis not present

## 2016-01-02 DIAGNOSIS — Z471 Aftercare following joint replacement surgery: Secondary | ICD-10-CM | POA: Diagnosis not present

## 2016-02-15 DIAGNOSIS — Z471 Aftercare following joint replacement surgery: Secondary | ICD-10-CM | POA: Diagnosis not present

## 2016-02-15 DIAGNOSIS — Z96642 Presence of left artificial hip joint: Secondary | ICD-10-CM | POA: Diagnosis not present

## 2016-03-24 DIAGNOSIS — M542 Cervicalgia: Secondary | ICD-10-CM | POA: Diagnosis not present

## 2016-03-24 DIAGNOSIS — M9902 Segmental and somatic dysfunction of thoracic region: Secondary | ICD-10-CM | POA: Diagnosis not present

## 2016-03-24 DIAGNOSIS — M9905 Segmental and somatic dysfunction of pelvic region: Secondary | ICD-10-CM | POA: Diagnosis not present

## 2016-03-24 DIAGNOSIS — M9904 Segmental and somatic dysfunction of sacral region: Secondary | ICD-10-CM | POA: Diagnosis not present

## 2016-03-24 DIAGNOSIS — M9901 Segmental and somatic dysfunction of cervical region: Secondary | ICD-10-CM | POA: Diagnosis not present

## 2016-03-24 DIAGNOSIS — M9903 Segmental and somatic dysfunction of lumbar region: Secondary | ICD-10-CM | POA: Diagnosis not present

## 2016-03-24 DIAGNOSIS — M545 Low back pain: Secondary | ICD-10-CM | POA: Diagnosis not present

## 2016-04-28 DIAGNOSIS — M9902 Segmental and somatic dysfunction of thoracic region: Secondary | ICD-10-CM | POA: Diagnosis not present

## 2016-04-28 DIAGNOSIS — M9905 Segmental and somatic dysfunction of pelvic region: Secondary | ICD-10-CM | POA: Diagnosis not present

## 2016-04-28 DIAGNOSIS — M9904 Segmental and somatic dysfunction of sacral region: Secondary | ICD-10-CM | POA: Diagnosis not present

## 2016-04-28 DIAGNOSIS — M545 Low back pain: Secondary | ICD-10-CM | POA: Diagnosis not present

## 2016-04-28 DIAGNOSIS — M542 Cervicalgia: Secondary | ICD-10-CM | POA: Diagnosis not present

## 2016-04-28 DIAGNOSIS — M9903 Segmental and somatic dysfunction of lumbar region: Secondary | ICD-10-CM | POA: Diagnosis not present

## 2016-04-28 DIAGNOSIS — M9901 Segmental and somatic dysfunction of cervical region: Secondary | ICD-10-CM | POA: Diagnosis not present

## 2016-05-14 ENCOUNTER — Ambulatory Visit (HOSPITAL_COMMUNITY)
Admission: EM | Admit: 2016-05-14 | Discharge: 2016-05-14 | Disposition: A | Discharge status: Home / Self Care | Payer: Medicare Other | Attending: Internal Medicine | Admitting: Internal Medicine

## 2016-05-14 ENCOUNTER — Encounter (HOSPITAL_COMMUNITY): Payer: Self-pay | Admitting: Family Medicine

## 2016-05-14 ENCOUNTER — Ambulatory Visit (INDEPENDENT_AMBULATORY_CARE_PROVIDER_SITE_OTHER): Payer: Medicare Other

## 2016-05-14 DIAGNOSIS — Z20828 Contact with and (suspected) exposure to other viral communicable diseases: Secondary | ICD-10-CM

## 2016-05-14 DIAGNOSIS — J069 Acute upper respiratory infection, unspecified: Secondary | ICD-10-CM | POA: Diagnosis not present

## 2016-05-14 DIAGNOSIS — B9789 Other viral agents as the cause of diseases classified elsewhere: Secondary | ICD-10-CM | POA: Diagnosis not present

## 2016-05-14 DIAGNOSIS — R05 Cough: Secondary | ICD-10-CM | POA: Diagnosis not present

## 2016-05-14 MED ORDER — OSELTAMIVIR PHOSPHATE 75 MG PO CAPS: 75.0000 mg | ORAL_CAPSULE | Freq: Two times a day (BID) | ORAL | 0 refills | Status: DC

## 2016-05-14 MED ORDER — BENZONATATE 100 MG PO CAPS: 100.0000 mg | ORAL_CAPSULE | Freq: Three times a day (TID) | ORAL | 0 refills | Status: DC

## 2016-05-14 MED ORDER — GUAIFENESIN-CODEINE 100-10 MG/5ML PO SYRP: 5.0000 mL | ORAL_SOLUTION | Freq: Three times a day (TID) | ORAL | 0 refills | Status: DC | PRN

## 2016-05-14 NOTE — ED Provider Notes (Signed)
CSN: [id number]     Arrival date & time 05/14/16  1125 History   First MD Initiated Contact with Patient 05/14/16 1216     Chief Complaint  Patient presents with  . Cough  . Nasal Congestion  . Weakness   (Consider location/radiation/quality/duration/timing/severity/associated sxs/prior Treatment) HPI  Jacob Mitchell is a 71 y.o. male presenting to UC with c/o sudden onset body aches, cough, congestion and low-grade fever that started yesterday.  Pt notes he was at a wedding this weekend and several people tested positive for the flu. Pt does not believe he has the flu as he did get the flu vaccine this season.  Denies n/v/d. Denies chest pain or SOB. He is going out of town again on Friday for a high school reunion and hopes to be better by then.    Past Medical History:  Diagnosis Date  . Arthritis   . Complication of anesthesia    pt says he gets "restless" when he is waking up and nurses have had to "hold him down"  . History of kidney stones    Past Surgical History:  Procedure Laterality Date  . CYST REMOVAL LEG Right   . [place]  . HAND SURGERY  1968  . LAMINECTOMY  1970  . LUMBAR FUSION     L1-L5  . ROTATOR CUFF REPAIR     5 surgeries in 9 days  . SHOULDER SURGERY Left 1976  . TOTAL HIP ARTHROPLASTY Left 11/23/2015   Procedure: LEFT TOTAL HIP ARTHROPLASTY ANTERIOR APPROACH;  Surgeon: Paralee Cancel, MD;  Location: [facility];  Service: Orthopedics;  Laterality: Left;   History reviewed. No pertinent family history. Social History  Substance Use Topics  . Smoking status: Never Smoker  . Smokeless tobacco: Never Used  . Alcohol use No    Review of Systems  Constitutional: Positive for fever. Negative for chills.  HENT: Positive for congestion and rhinorrhea. Negative for ear pain, sore throat, trouble swallowing and voice change.   Respiratory: Positive for cough. Negative for shortness of breath.   Cardiovascular: Negative for chest pain and palpitations.   Gastrointestinal: Negative for abdominal pain, diarrhea, nausea and vomiting.  Musculoskeletal: Negative for arthralgias, back pain and myalgias.  Skin: Negative for rash.    Allergies  Aspartame and phenylalanine  Home Medications   Prior to Admission medications   Medication Sig Start Date End Date Taking? Authorizing Provider  aspirin 81 MG chewable tablet Chew 1 tablet (81 mg total) by mouth daily. Take for 4 weeks. 11/23/15   Danae Orleans, PA-C  benzonatate (TESSALON) 100 MG capsule Take 1 capsule (100 mg total) by mouth every 8 (eight) hours. 05/14/16   Noland Fordyce, PA-C  Cholecalciferol (VITAMIN D3) 1000 units CAPS Take 3,000 Units by mouth daily.    Historical Provider, MD  docusate sodium (COLACE) 100 MG capsule Take 1 capsule (100 mg total) by mouth 2 (two) times daily. To help treat constipation. 11/23/15   Danae Orleans, PA-C  ferrous sulfate (FERROUSUL) 325 (65 FE) MG tablet Take 1 tablet (325 mg total) by mouth 3 (three) times daily with meals. 11/23/15   Danae Orleans, PA-C  ferrous sulfate 325 (65 FE) MG tablet Take 1 tablet (325 mg total) by mouth 3 (three) times daily after meals. 11/25/15   Bryson L Stilwell, PA-C  guaiFENesin-codeine (VIRTUSSIN A/C) 100-10 MG/5ML syrup Take 5-10 mLs by mouth 3 (three) times daily as needed for cough or congestion. 05/14/16   Noland Fordyce, PA-C  HYDROcodone-acetaminophen (  NORCO) 7.5-325 MG tablet Take 1-2 tablets by mouth every 4 (four) hours as needed for moderate pain. 11/23/15   Danae Orleans, PA-C  HYDROcodone-acetaminophen (NORCO) 7.5-325 MG tablet Take 1-2 tablets by mouth every 4 (four) hours. 11/25/15   Bryson L Stilwell, PA-C  methocarbamol (ROBAXIN) 500 MG tablet Take 1 tablet (500 mg total) by mouth every 6 (six) hours as needed for muscle spasms. 11/23/15   Danae Orleans, PA-C  methocarbamol (ROBAXIN) 500 MG tablet Take 1 tablet (500 mg total) by mouth every 8 (eight) hours as needed for muscle spasms. 11/25/15   Bryson L  Stilwell, PA-C  oseltamivir (TAMIFLU) 75 MG capsule Take 1 capsule (75 mg total) by mouth every 12 (twelve) hours. 05/14/16   Noland Fordyce, PA-C  polyethylene glycol [facility]) packet Take 17 g by mouth 2 (two) times daily. To help treat constipation. 11/23/15   Danae Orleans, PA-C  traZODone (DESYREL) 50 MG tablet Take 50 mg by mouth at bedtime.    Historical Provider, MD   Meds Ordered and Administered this Visit  Medications - No data to display  BP (!) 117/51   Pulse 82   Temp 99.5 F (37.5 C)   Resp 18   SpO2 98%  No data found.   Physical Exam  Constitutional: He appears well-developed and well-nourished. No distress.  HENT:  Head: Normocephalic and atraumatic.  Right Ear: Tympanic membrane normal.  Left Ear: Tympanic membrane normal.  Nose: Nose normal.  Mouth/Throat: Uvula is midline, oropharynx is clear and moist and mucous membranes are normal.  Eyes: Conjunctivae are normal. No scleral icterus.  Neck: Normal range of motion. Neck supple.  Cardiovascular: Normal rate, regular rhythm and normal heart sounds.   Pulmonary/Chest: Effort normal. No stridor. No respiratory distress. He has no wheezes. He has rhonchi ( faint in lower lung fields, clear with cough). He has no rales.  Abdominal: Soft. He exhibits no distension. There is no tenderness. There is no guarding.  Musculoskeletal: Normal range of motion.  Lymphadenopathy:    He has no cervical adenopathy.  Neurological: He is alert.  Skin: Skin is warm and dry. He is not diaphoretic.  Nursing note and vitals reviewed.   Urgent Care Course     Procedures (including critical care time)  Labs Review Labs Reviewed - No data to display  Imaging Review Dg Chest 2 View  Result Date: 05/14/2016 CLINICAL DATA:  Per pt: started getting sick yesterday with low grade fever productive cough. Non-smoker. No history of cardiac or respiratory disease. History of several right and left shoulder surgeries. EXAM: CHEST  2 VIEW  COMPARISON:  05/28/2005 FINDINGS: There is no focal parenchymal opacity. There is no pleural effusion or pneumothorax. The heart and mediastinal contours are unremarkable. The osseous structures are unremarkable. IMPRESSION: No active cardiopulmonary disease. Electronically Signed   By: Kathreen Devoid   On: 05/14/2016 12:44     MDM   1. Viral URI with cough   2. Exposure to the flu    Pt presenting to UC with 1.5 days of URI symptoms. Exposure to flu.    Pt has a temp of 99.5*F in UC  CXR: unremarkable Pt overall appears well. Likely viral illness such as common cold.  Encouraged fluids and rest Rx: Tessalon, Virtussin (warned about not drinking or driving while taking due to likelihood of drowsiness). Prescription given for Tamilfu. He may take as preventative medication due to known exposure, however, also strongly encouraged to take if symptoms of fever and  body aches worsen by tomorrow.  Patient verbalized understanding and agreement with treatment plan.     Noland Fordyce, PA-C 05/14/16 1301

## 2016-05-14 NOTE — Discharge Instructions (Signed)
°  Your chest x-ray today looked normal. No signs of pneumonia a this time. Your symptoms are likely due to a virus such as the common cold, however, if you develop high fever >101*F severe fatigue or body aches you most likely have influenza and should start taking the Tamiflu as it can help reduce the severity of symptoms and help shorten the duration of symptoms by about 1 day.  Due to known exposure to influenza, you may want to start taking the Tamiflu as a preventative treatment.  If you do start taking the Tamiflu, keep in mind:   Oseltamivir (Tamiflu) is an antiviral medication that can help decrease symptoms of the flu by about 1 days and lessen severity of symptoms.  It is best to start this medication within first 48 hours of symptoms, however, some studies have shown relief even after the 48 hour time frame.  This medication may cause stomach upset including nausea, vomiting and diarrhea.  It may also cause dizziness or hallucinations in children.  To help prevent stomach upset, you may take this medication with food.  If you are still having unwanted symptoms, you may stop taking this medication as it is not as important to finish the entire course like antibiotics.  If you have questions/concerns please call our office or follow up with your primary care provider.      You should get plenty of rest between now and when you have to leave for your trip.    You may take 400-600mg  Ibuprofen (Motrin) every 6-8 hours for fever and pain  Alternate with Tylenol. You may take 500mg  Tylenol every 4-6 hours as needed for fever and pain.   Follow-up with your primary care provider next week for recheck of symptoms if not improving.  Be sure to drink plenty of fluids and rest, at least 8hrs of sleep a night, preferably more while you are sick.  Return urgent care or go to closest ER if you cannot keep down fluids/signs of dehydration, fever not reducing with Tylenol, difficulty breathing/wheezing,  stiff neck, worsening condition, or other concerns (see below)   Virtussin (guaifenesin-codeine) is a strong narcotic cough medication.  Only take up to 3 times daily as needed for severe cough.  Be sure to take with large glass of water.  It may cause drowsiness. Do not drive, drink alcohol or take other sedating medications such as Nyquil while taking this medication.

## 2016-05-14 NOTE — ED Triage Notes (Signed)
Pt here for cough, congestion, fever, body aches.

## 2016-07-24 DIAGNOSIS — N200 Calculus of kidney: Secondary | ICD-10-CM | POA: Insufficient documentation

## 2016-07-24 DIAGNOSIS — G629 Polyneuropathy, unspecified: Secondary | ICD-10-CM | POA: Diagnosis not present

## 2016-07-24 DIAGNOSIS — Z87442 Personal history of urinary calculi: Secondary | ICD-10-CM | POA: Diagnosis not present

## 2016-10-08 ENCOUNTER — Encounter (HOSPITAL_COMMUNITY)
Admission: RE | Admit: 2016-10-08 | Discharge: 2016-10-08 | Disposition: A | Discharge status: Home / Self Care | Payer: Medicare Other | Source: Ambulatory Visit | Attending: Orthopedic Surgery | Admitting: Orthopedic Surgery

## 2016-10-08 ENCOUNTER — Encounter (HOSPITAL_COMMUNITY): Payer: Self-pay

## 2016-10-08 ENCOUNTER — Encounter (INDEPENDENT_AMBULATORY_CARE_PROVIDER_SITE_OTHER): Payer: Self-pay

## 2016-10-08 DIAGNOSIS — Z96642 Presence of left artificial hip joint: Secondary | ICD-10-CM | POA: Diagnosis not present

## 2016-10-08 DIAGNOSIS — Z01818 Encounter for other preprocedural examination: Secondary | ICD-10-CM | POA: Diagnosis not present

## 2016-10-08 DIAGNOSIS — Z471 Aftercare following joint replacement surgery: Secondary | ICD-10-CM | POA: Diagnosis not present

## 2016-10-08 DIAGNOSIS — T84018A Broken internal joint prosthesis, other site, initial encounter: Secondary | ICD-10-CM | POA: Diagnosis not present

## 2016-10-08 LAB — CBC
HCT: 44.5 % (ref 39.0–52.0)
Hemoglobin: 15 g/dL (ref 13.0–17.0)
MCH: 29.4 pg (ref 26.0–34.0)
MCHC: 33.7 g/dL (ref 30.0–36.0)
MCV: 87.1 fL (ref 78.0–100.0)
Platelets: 208 10*3/uL (ref 150–400)
RBC: 5.11 MIL/uL (ref 4.22–5.81)
RDW: 13.7 % (ref 11.5–15.5)
WBC: 6.7 10*3/uL (ref 4.0–10.5)

## 2016-10-08 LAB — SURGICAL PCR SCREEN
MRSA, PCR: NEGATIVE
Staphylococcus aureus: POSITIVE — AB

## 2016-10-08 NOTE — Patient Instructions (Signed)
Jacob Mitchell  10/08/2016   Your procedure is scheduled on: 10/14/16  Report to [facility] Main  Entrance   Report to admitting at     1045 AM   Call this number if you have problems the morning of surgery  [phone]   Remember: ONLY 1 PERSON MAY GO WITH YOU TO SHORT STAY TO GET  READY MORNING OF [place].  Do not eat food :After Midnight. You may have clear liquids until 0700 am then nothing by mouth.     CLEAR LIQUID DIET  Till 0700 am then nothing by mouth   Foods Allowed                                                                     Foods Excluded  Coffee and tea, regular and decaf                             liquids that you cannot  Plain Jell-O in any flavor                                             see through such as: Fruit ices (not with fruit pulp)                                     milk, soups, orange juice  Iced Popsicles                                    All solid food Carbonated beverages, regular and diet                                    Cranberry, grape and apple juices Sports drinks like Gatorade Lightly seasoned clear broth or consume(fat free) Sugar, honey syrup  _____________________________________________________________________    Take these medicines the morning of surgery with A SIP OF WATER: Hydrocodone if needed                                You may not have any metal on your body including hair pins and              piercings  Do not wear jewelry,lotions, powders or perfumes, deodorant              Men may shave face and neck.   Do not bring valuables to the hospital. [place].  Contacts, dentures or bridgework may not be worn into surgery.  Leave suitcase in the car. After surgery it may be brought to your room.  Please read over the following fact sheets you were  given: _____________________________________________________________________          [facility] - Preparing for Surgery Before surgery, you can play an important role.  Because skin is not sterile, your skin needs to be as free of germs as possible.  You can reduce the number of germs on your skin by washing with CHG (chlorahexidine gluconate) soap before surgery.  CHG is an antiseptic cleaner which kills germs and bonds with the skin to continue killing germs even after washing. Please DO NOT use if you have an allergy to CHG or antibacterial soaps.  If your skin becomes reddened/irritated stop using the CHG and inform your nurse when you arrive at Short Stay. Do not shave (including legs and underarms) for at least 48 hours prior to the first CHG shower.  You may shave your face/neck. Please follow these instructions carefully:  1.  Shower with CHG Soap the night before surgery and the  morning of Surgery.  2.  If you choose to wash your hair, wash your hair first as usual with your  normal  shampoo.  3.  After you shampoo, rinse your hair and body thoroughly to remove the  shampoo.                           4.  Use CHG as you would any other liquid soap.  You can apply chg directly  to the skin and wash                       Gently with a scrungie or clean washcloth.  5.  Apply the CHG Soap to your body ONLY FROM THE NECK DOWN.   Do not use on face/ open                           Wound or open sores. Avoid contact with eyes, ears mouth and genitals (private parts).                       Wash face,  Genitals (private parts) with your normal soap.             6.  Wash thoroughly, paying special attention to the area where your surgery  will be performed.  7.  Thoroughly rinse your body with warm water from the neck down.  8.  DO NOT shower/wash with your normal soap after using and rinsing off  the CHG Soap.                9.  Pat yourself dry with a clean towel.            10.  Wear clean  pajamas.            11.  Place clean sheets on your bed the night of your first shower and do not  sleep with pets. Day of Surgery : Do not apply any lotions/deodorants the morning of surgery.  Please wear clean clothes to the hospital/surgery center.  FAILURE TO FOLLOW THESE INSTRUCTIONS MAY RESULT IN THE CANCELLATION OF YOUR SURGERY PATIENT SIGNATURE_________________________________  NURSE SIGNATURE__________________________________  ________________________________________________________________________  WHAT IS A BLOOD TRANSFUSION? Blood Transfusion Information  A transfusion is the replacement of blood or some of its parts. Blood is made up of multiple cells which provide different functions.  Red blood cells  carry oxygen and are used for blood loss replacement.  White blood cells fight against infection.  Platelets control bleeding.  Plasma helps clot blood.  Other blood products are available for specialized needs, such as hemophilia or other clotting disorders. BEFORE THE TRANSFUSION  Who gives blood for transfusions?   Healthy volunteers who are fully evaluated to make sure their blood is safe. This is blood bank blood. Transfusion therapy is the safest it has ever been in the practice of medicine. Before blood is taken from a donor, a complete history is taken to make sure that person has no history of diseases nor engages in risky social behavior (examples are intravenous drug use or sexual activity with multiple partners). The donor's travel history is screened to minimize risk of transmitting infections, such as malaria. The donated blood is tested for signs of infectious diseases, such as HIV and hepatitis. The blood is then tested to be sure it is compatible with you in order to minimize the chance of a transfusion reaction. If you or a relative donates blood, this is often done in anticipation of surgery and is not appropriate for emergency situations. It takes many days  to process the donated blood. RISKS AND COMPLICATIONS Although transfusion therapy is very safe and saves many lives, the main dangers of transfusion include:   Getting an infectious disease.  Developing a transfusion reaction. This is an allergic reaction to something in the blood you were given. Every precaution is taken to prevent this. The decision to have a blood transfusion has been considered carefully by your caregiver before blood is given. Blood is not given unless the benefits outweigh the risks. AFTER THE TRANSFUSION  Right after receiving a blood transfusion, you will usually feel much better and more energetic. This is especially true if your red blood cells have gotten low (anemic). The transfusion raises the level of the red blood cells which carry oxygen, and this usually causes an energy increase.  The nurse administering the transfusion will monitor you carefully for complications. HOME CARE INSTRUCTIONS  No special instructions are needed after a transfusion. You may find your energy is better. Speak with your caregiver about any limitations on activity for underlying diseases you may have. SEEK MEDICAL CARE IF:   Your condition is not improving after your transfusion.  You develop redness or irritation at the intravenous (IV) site. SEEK IMMEDIATE MEDICAL CARE IF:  Any of the following symptoms occur over the next 12 hours:  Shaking chills.  You have a temperature by mouth above 102 F (38.9 C), not controlled by medicine.  Chest, back, or muscle pain.  People around you feel you are not acting correctly or are confused.  Shortness of breath or difficulty breathing.  Dizziness and fainting.  You get a rash or develop hives.  You have a decrease in urine output.  Your urine turns a dark color or changes to pink, red, or brown. Any of the following symptoms occur over the next 10 days:  You have a temperature by mouth above 102 F (38.9 C), not controlled  by medicine.  Shortness of breath.  Weakness after normal activity.  The white part of the eye turns yellow (jaundice).  You have a decrease in the amount of urine or are urinating less often.  Your urine turns a dark color or changes to pink, red, or brown. Document Released: 01/25/2000 Document Revised: 04/21/2011 Document Reviewed: 09/13/2007 [facility] Patient Information 2014 [place], [place].  _______________________________________________________________________  Incentive  Spirometer  An incentive spirometer is a tool that can help keep your lungs clear and active. This tool measures how well you are filling your lungs with each breath. Taking long deep breaths may help reverse or decrease the chance of developing breathing (pulmonary) problems (especially infection) following:  A long period of time when you are unable to move or be active. BEFORE THE PROCEDURE   If the spirometer includes an indicator to show your best effort, your nurse or respiratory therapist will set it to a desired goal.  If possible, sit up straight or lean slightly forward. Try not to slouch.  Hold the incentive spirometer in an upright position. INSTRUCTIONS FOR USE  1. Sit on the edge of your bed if possible, or sit up as far as you can in bed or on a chair. 2. Hold the incentive spirometer in an upright position. 3. Breathe out normally. 4. Place the mouthpiece in your mouth and seal your lips tightly around it. 5. Breathe in slowly and as deeply as possible, raising the piston or the ball toward the top of the column. 6. Hold your breath for 3-5 seconds or for as long as possible. Allow the piston or ball to fall to the bottom of the column. 7. Remove the mouthpiece from your mouth and breathe out normally. 8. Rest for a few seconds and repeat Steps 1 through 7 at least 10 times every 1-2 hours when you are awake. Take your time and take a few normal breaths between deep breaths. 9. The  spirometer may include an indicator to show your best effort. Use the indicator as a goal to work toward during each repetition. 10. After each set of 10 deep breaths, practice coughing to be sure your lungs are clear. If you have an incision (the cut made at the time of surgery), support your incision when coughing by placing a pillow or rolled up towels firmly against it. Once you are able to get out of bed, walk around indoors and cough well. You may stop using the incentive spirometer when instructed by your caregiver.  RISKS AND COMPLICATIONS  Take your time so you do not get dizzy or light-headed.  If you are in pain, you may need to take or ask for pain medication before doing incentive spirometry. It is harder to take a deep breath if you are having pain. AFTER USE  Rest and breathe slowly and easily.  It can be helpful to keep track of a log of your progress. Your caregiver can provide you with a simple table to help with this. If you are using the spirometer at home, follow these instructions: [place] IF:   You are having difficultly using the spirometer.  You have trouble using the spirometer as often as instructed.  Your pain medication is not giving enough relief while using the spirometer.  You develop fever of 100.5 F (38.1 C) or higher. SEEK IMMEDIATE MEDICAL CARE IF:   You cough up bloody sputum that had not been present before.  You develop fever of 102 F (38.9 C) or greater.  You develop worsening pain at or near the incision site. MAKE SURE YOU:   Understand these instructions.  Will watch your condition.  Will get help right away if you are not doing well or get worse. Document Released: 06/09/2006 Document Revised: 04/21/2011 Document Reviewed: 08/10/2006 [facility] Patient Information 2014 [place], [place].   ________________________________________________________________________

## 2016-10-09 NOTE — Progress Notes (Signed)
Pt. Traveling  Prescription of mupiricion called to [facility] [place].

## 2016-10-13 MED ORDER — CEFAZOLIN SODIUM 10 G IJ SOLR
3.0000 g | INTRAMUSCULAR | Status: AC
  Administered 2016-10-14: 3 g via INTRAVENOUS
  Filled 2016-10-13: qty 3000
  Filled 2016-10-13: qty 3

## 2016-10-13 NOTE — H&P (Signed)
TOTAL HIP REVISION ADMISSION H&P  Patient is admitted for left revision total hip arthroplasty, posterior approach.  Subjective:  Chief Complaint:   Left hip squeak after THA  HPI: Gilad Dugger, 71 y.o. male, is a pleasant 71 year old male who is now 10-1/2 months after his left total hip arthroplasty. He states that he had been doing fairly well, though admits not doing a lot of activity and exercise. He has about a two to three-day history now of noticing some squeaking in his left hip with every step he takes. He has had no fevers, chills, or night sweats. No instability.  He is seen in the office. He does have an audible squeaking with every step with his left lower extremity. He is otherwise neurovascularly intact. He does walk with a little bit of a Trendelenburg gait.  Ten and a half months after left total hip arthroplasty with concern for polyethylene dissociation resulting in his metal head articulating with the metal shell resulting in the squeak. In the office, an AP pelvis, AP and lateral of his left hip showed stable femoral component and acetabular component; however, there is significant eccentricity to the femoral head within the acetabulum. The shadow of the polyethylene can not be seen; however, seems to be the only explanation for this after only  10-1/2 months. For this reason, Dr. Alvan Dame scheduled him for revision hip surgery.  Risks, benefits and expectations were discussed with the patient.  Risks including but not limited to the risk of anesthesia, blood clots, nerve damage, blood vessel damage, failure of the prosthesis, infection and up to and including death.  Patient understand the risks, benefits and expectations and wishes to proceed with surgery.   PCP: Patient, No Pcp Per  D/C Plans:       Home   Post-op Meds:       No Rx given   Tranexamic Acid:      To be given - IV   Decadron:      Is to be given  FYI:     ASA  [place]  DME:   Pt already has equipment   PT:     No PT   Patient Active Problem List   Diagnosis Date Noted  . S/P left THA, AA 11/23/2015   Past Medical History:  Diagnosis Date  . Arthritis   . Complication of anesthesia    pt says he gets "restless" when he is waking up and nurses have had to "hold him down"  . History of kidney stones    hx of    Past Surgical History:  Procedure Laterality Date  . CYST REMOVAL LEG Right   . [place]   x2  . HAND SURGERY  1968  . LAMINECTOMY  1970  . LUMBAR FUSION     L1-L5  . ROTATOR CUFF REPAIR     5 surgeries in 9 days  . SHOULDER SURGERY Left 1976  . TOTAL HIP ARTHROPLASTY Left 11/23/2015   Procedure: LEFT TOTAL HIP ARTHROPLASTY ANTERIOR APPROACH;  Surgeon: Paralee Cancel, MD;  Location: [facility];  Service: Orthopedics;  Laterality: Left;  . TOTAL HIP REVISION     10/14/16 Dr. Alvan Dame    No prescriptions prior to admission.   Allergies  Allergen Reactions  . Aspartame And Phenylalanine Palpitations    Artificial sweetener- hrt 140  went to ED    Social History  Substance Use Topics  . Smoking status: Never Smoker  . Smokeless tobacco: Never Used  .  Alcohol use No        Review of Systems  Constitutional: Negative.   HENT: Negative.   Eyes: Negative.   Respiratory: Negative.   Cardiovascular: Negative.   Gastrointestinal: Negative.   Genitourinary: Negative.   Musculoskeletal: Positive for joint pain.  Skin: Negative.   Neurological: Negative.   Endo/Heme/Allergies: Negative.   Psychiatric/Behavioral: Negative.     Objective:  Physical Exam  Constitutional: He is oriented to person, place, and time. He appears well-developed.  HENT:  Head: Normocephalic.  Eyes: Pupils are equal, round, and reactive to light.  Neck: Neck supple. No JVD present. No tracheal deviation present. No thyromegaly present.  Cardiovascular: Normal rate, regular rhythm and intact distal pulses.   Respiratory: Effort normal and breath sounds normal. No respiratory distress. He  has no wheezes.  GI: Soft. There is no tenderness. There is no guarding.  Musculoskeletal:       Left hip: He exhibits decreased strength, tenderness and crepitus. He exhibits normal range of motion.  Lymphadenopathy:    He has no cervical adenopathy.  Neurological: He is alert and oriented to person, place, and time.  Skin: Skin is warm and dry.  Psychiatric: He has a normal mood and affect.     Labs:   Estimated body mass index is 33.05 kg/m as calculated from the following:   Height as of 10/08/16: 6\' 6"  (1.981 m).   Weight as of 10/08/16: 129.7 kg (286 lb).  Imaging Review:  Plain radiographs demonstrate previous THA of the left hip(s). There is evidence of loosening of the acetabular cup.The bone quality appears to be good for age and reported activity level.   Assessment/Plan:  Left hip(s) with failed previous arthroplasty.  The patient history, physical examination, clinical judgement of the provider and imaging studies are consistent with failure of the left hip(s), previous total hip arthroplasty. Revision total hip arthroplasty is deemed medically necessary. The treatment options including medical management, injection therapy, arthroscopy and arthroplasty were discussed at length. The risks and benefits of total hip arthroplasty were presented and reviewed. The risks due to aseptic loosening, infection, stiffness, dislocation/subluxation,  thromboembolic complications and other imponderables were discussed.  The patient acknowledged the explanation, agreed to proceed with the plan and consent was signed. Patient is being admitted for inpatient treatment for surgery, pain control, PT, OT, prophylactic antibiotics, VTE prophylaxis, progressive ambulation and ADL's and discharge planning. The patient is planning to be discharged home.    West Pugh Ardon Franklin   PA-C  10/13/2016, 11:13 PM

## 2016-10-14 ENCOUNTER — Inpatient Hospital Stay (HOSPITAL_COMMUNITY)
Admission: RE | Admit: 2016-10-14 | Discharge: 2016-10-15 | DRG: 468 | Disposition: A | Discharge status: Home / Self Care | Payer: Medicare Other | Attending: Orthopedic Surgery | Admitting: Orthopedic Surgery

## 2016-10-14 ENCOUNTER — Inpatient Hospital Stay (HOSPITAL_COMMUNITY): Payer: Medicare Other | Admitting: Anesthesiology

## 2016-10-14 ENCOUNTER — Inpatient Hospital Stay (HOSPITAL_COMMUNITY): Payer: Medicare Other

## 2016-10-14 ENCOUNTER — Encounter (HOSPITAL_COMMUNITY): Payer: Self-pay | Admitting: Anesthesiology

## 2016-10-14 ENCOUNTER — Encounter (HOSPITAL_COMMUNITY)
Admission: RE | Disposition: A | Discharge status: Home / Self Care | Payer: Self-pay | Source: Ambulatory Visit | Attending: Orthopedic Surgery

## 2016-10-14 DIAGNOSIS — Z6833 Body mass index (BMI) 33.0-33.9, adult: Secondary | ICD-10-CM

## 2016-10-14 DIAGNOSIS — M24562 Contracture, left knee: Secondary | ICD-10-CM | POA: Diagnosis present

## 2016-10-14 DIAGNOSIS — Y792 Prosthetic and other implants, materials and accessory orthopedic devices associated with adverse incidents: Secondary | ICD-10-CM | POA: Diagnosis present

## 2016-10-14 DIAGNOSIS — T84091A Other mechanical complication of internal left hip prosthesis, initial encounter: Secondary | ICD-10-CM | POA: Diagnosis not present

## 2016-10-14 DIAGNOSIS — Z9102 Food additives allergy status: Secondary | ICD-10-CM

## 2016-10-14 DIAGNOSIS — E669 Obesity, unspecified: Secondary | ICD-10-CM | POA: Diagnosis present

## 2016-10-14 DIAGNOSIS — Z96642 Presence of left artificial hip joint: Secondary | ICD-10-CM | POA: Diagnosis not present

## 2016-10-14 DIAGNOSIS — M1612 Unilateral primary osteoarthritis, left hip: Secondary | ICD-10-CM | POA: Diagnosis not present

## 2016-10-14 DIAGNOSIS — Y838 Other surgical procedures as the cause of abnormal reaction of the patient, or of later complication, without mention of misadventure at the time of the procedure: Secondary | ICD-10-CM | POA: Diagnosis present

## 2016-10-14 DIAGNOSIS — T84021A Dislocation of internal left hip prosthesis, initial encounter: Principal | ICD-10-CM | POA: Diagnosis present

## 2016-10-14 DIAGNOSIS — T84061A Wear of articular bearing surface of internal prosthetic left hip joint, initial encounter: Secondary | ICD-10-CM | POA: Diagnosis not present

## 2016-10-14 DIAGNOSIS — Z87442 Personal history of urinary calculi: Secondary | ICD-10-CM | POA: Diagnosis not present

## 2016-10-14 DIAGNOSIS — Z96649 Presence of unspecified artificial hip joint: Secondary | ICD-10-CM

## 2016-10-14 DIAGNOSIS — M1990 Unspecified osteoarthritis, unspecified site: Secondary | ICD-10-CM | POA: Diagnosis present

## 2016-10-14 HISTORY — PX: TOTAL HIP REVISION: SHX763

## 2016-10-14 LAB — TYPE AND SCREEN
ABO/RH(D): O POS
Antibody Screen: NEGATIVE

## 2016-10-14 SURGERY — TOTAL HIP REVISION
Anesthesia: General | Site: Hip | Laterality: Left

## 2016-10-14 MED ORDER — ONDANSETRON HCL 4 MG/2ML IJ SOLN: 4.0000 mg | Freq: Four times a day (QID) | INTRAMUSCULAR | Status: DC | PRN

## 2016-10-14 MED ORDER — PROPOFOL 10 MG/ML IV BOLUS
INTRAVENOUS | Status: AC
  Filled 2016-10-14: qty 20

## 2016-10-14 MED ORDER — CELECOXIB 200 MG PO CAPS
200.0000 mg | ORAL_CAPSULE | Freq: Two times a day (BID) | ORAL | Status: DC
  Administered 2016-10-14 – 2016-10-15 (×2): 200 mg via ORAL
  Filled 2016-10-14 (×2): qty 1

## 2016-10-14 MED ORDER — DOCUSATE SODIUM 100 MG PO CAPS
100.0000 mg | ORAL_CAPSULE | Freq: Two times a day (BID) | ORAL | Status: DC
  Administered 2016-10-14 – 2016-10-15 (×2): 100 mg via ORAL
  Filled 2016-10-14 (×2): qty 1

## 2016-10-14 MED ORDER — METOCLOPRAMIDE HCL 5 MG PO TABS: 5.0000 mg | ORAL_TABLET | Freq: Three times a day (TID) | ORAL | Status: DC | PRN

## 2016-10-14 MED ORDER — HYDROCODONE-ACETAMINOPHEN 7.5-325 MG PO TABS
1.0000 | ORAL_TABLET | ORAL | Status: DC
  Administered 2016-10-14: 1 via ORAL
  Filled 2016-10-14 (×2): qty 1
  Filled 2016-10-14: qty 2

## 2016-10-14 MED ORDER — TRANEXAMIC ACID 1000 MG/10ML IV SOLN
1000.0000 mg | Freq: Once | INTRAVENOUS | Status: AC
  Administered 2016-10-14: 1000 mg via INTRAVENOUS
  Filled 2016-10-14: qty 10

## 2016-10-14 MED ORDER — BISACODYL 10 MG RE SUPP: 10.0000 mg | Freq: Every day | RECTAL | Status: DC | PRN

## 2016-10-14 MED ORDER — ROCURONIUM BROMIDE 10 MG/ML (PF) SYRINGE
PREFILLED_SYRINGE | INTRAVENOUS | Status: DC | PRN
  Administered 2016-10-14: 20 mg via INTRAVENOUS
  Administered 2016-10-14: 60 mg via INTRAVENOUS
  Administered 2016-10-14: 20 mg via INTRAVENOUS

## 2016-10-14 MED ORDER — POLYETHYLENE GLYCOL 3350 17 G PO PACK
17.0000 g | PACK | Freq: Two times a day (BID) | ORAL | Status: DC
  Administered 2016-10-15: 17 g via ORAL
  Filled 2016-10-14: qty 1

## 2016-10-14 MED ORDER — FENTANYL CITRATE (PF) 100 MCG/2ML IJ SOLN: 25.0000 ug | INTRAMUSCULAR | Status: DC | PRN

## 2016-10-14 MED ORDER — TRAZODONE HCL 50 MG PO TABS
50.0000 mg | ORAL_TABLET | Freq: Every day | ORAL | Status: DC
  Administered 2016-10-14: 50 mg via ORAL
  Filled 2016-10-14: qty 1

## 2016-10-14 MED ORDER — EPHEDRINE SULFATE-NACL 50-0.9 MG/10ML-% IV SOSY
PREFILLED_SYRINGE | INTRAVENOUS | Status: DC | PRN
  Administered 2016-10-14: 15 mg via INTRAVENOUS

## 2016-10-14 MED ORDER — ONDANSETRON HCL 4 MG PO TABS: 4.0000 mg | ORAL_TABLET | Freq: Four times a day (QID) | ORAL | Status: DC | PRN

## 2016-10-14 MED ORDER — ALUM & MAG HYDROXIDE-SIMETH 200-200-20 MG/5ML PO SUSP: 15.0000 mL | ORAL | Status: DC | PRN

## 2016-10-14 MED ORDER — ROCURONIUM BROMIDE 50 MG/5ML IV SOSY
PREFILLED_SYRINGE | INTRAVENOUS | Status: AC
  Filled 2016-10-14: qty 10

## 2016-10-14 MED ORDER — PROMETHAZINE HCL 25 MG/ML IJ SOLN
6.2500 mg | INTRAMUSCULAR | Status: DC | PRN
Start: 2016-10-14 — End: 2016-10-14

## 2016-10-14 MED ORDER — TRANEXAMIC ACID 1000 MG/10ML IV SOLN
1000.0000 mg | INTRAVENOUS | Status: DC
  Filled 2016-10-14: qty 10

## 2016-10-14 MED ORDER — SUGAMMADEX SODIUM 200 MG/2ML IV SOLN
INTRAVENOUS | Status: AC
  Filled 2016-10-14: qty 2

## 2016-10-14 MED ORDER — DEXAMETHASONE SODIUM PHOSPHATE 10 MG/ML IJ SOLN
10.0000 mg | Freq: Once | INTRAMUSCULAR | Status: AC
  Administered 2016-10-15: 10 mg via INTRAVENOUS
  Filled 2016-10-14: qty 1

## 2016-10-14 MED ORDER — METHOCARBAMOL 500 MG PO TABS
500.0000 mg | ORAL_TABLET | Freq: Four times a day (QID) | ORAL | Status: DC | PRN
  Administered 2016-10-14 – 2016-10-15 (×3): 500 mg via ORAL
  Filled 2016-10-14 (×3): qty 1

## 2016-10-14 MED ORDER — ASPIRIN 81 MG PO CHEW
81.0000 mg | CHEWABLE_TABLET | Freq: Two times a day (BID) | ORAL | Status: DC
  Administered 2016-10-14 – 2016-10-15 (×2): 81 mg via ORAL
  Filled 2016-10-14 (×2): qty 1

## 2016-10-14 MED ORDER — HYDROMORPHONE HCL 2 MG/ML IJ SOLN
INTRAMUSCULAR | Status: AC
  Filled 2016-10-14: qty 1

## 2016-10-14 MED ORDER — FENTANYL CITRATE (PF) 250 MCG/5ML IJ SOLN
INTRAMUSCULAR | Status: AC
Start: 2016-10-14 — End: 2016-10-14
  Filled 2016-10-14: qty 5

## 2016-10-14 MED ORDER — ONDANSETRON HCL 4 MG/2ML IJ SOLN
INTRAMUSCULAR | Status: DC | PRN
  Administered 2016-10-14: 4 mg via INTRAVENOUS

## 2016-10-14 MED ORDER — MAGNESIUM CITRATE PO SOLN: 1.0000 | Freq: Once | ORAL | Status: DC | PRN

## 2016-10-14 MED ORDER — LIDOCAINE 2% (20 MG/ML) 5 ML SYRINGE
INTRAMUSCULAR | Status: DC | PRN
  Administered 2016-10-14: 60 mg via INTRAVENOUS

## 2016-10-14 MED ORDER — STERILE WATER FOR IRRIGATION IR SOLN
Status: DC | PRN
  Administered 2016-10-14: 2000 mL

## 2016-10-14 MED ORDER — DIPHENHYDRAMINE HCL 25 MG PO CAPS: 25.0000 mg | ORAL_CAPSULE | Freq: Four times a day (QID) | ORAL | Status: DC | PRN

## 2016-10-14 MED ORDER — CEFAZOLIN SODIUM-DEXTROSE 2-4 GM/100ML-% IV SOLN
2.0000 g | Freq: Four times a day (QID) | INTRAVENOUS | Status: AC
  Administered 2016-10-14 – 2016-10-15 (×2): 2 g via INTRAVENOUS
  Filled 2016-10-14: qty 100

## 2016-10-14 MED ORDER — FENTANYL CITRATE (PF) 100 MCG/2ML IJ SOLN
INTRAMUSCULAR | Status: DC | PRN
  Administered 2016-10-14: 50 ug via INTRAVENOUS
  Administered 2016-10-14 (×2): 100 ug via INTRAVENOUS

## 2016-10-14 MED ORDER — 0.9 % SODIUM CHLORIDE (POUR BTL) OPTIME
TOPICAL | Status: DC | PRN
  Administered 2016-10-14: 1000 mL
  Administered 2016-10-14: 3000 mL

## 2016-10-14 MED ORDER — HYDROMORPHONE HCL-NACL 0.5-0.9 MG/ML-% IV SOSY
PREFILLED_SYRINGE | INTRAVENOUS | Status: AC
  Filled 2016-10-14: qty 4

## 2016-10-14 MED ORDER — LACTATED RINGERS IV SOLN
INTRAVENOUS | Status: DC
  Administered 2016-10-14 (×4): via INTRAVENOUS

## 2016-10-14 MED ORDER — CHLORHEXIDINE GLUCONATE 4 % EX LIQD: 60.0000 mL | Freq: Once | CUTANEOUS | Status: DC

## 2016-10-14 MED ORDER — FERROUS SULFATE 325 (65 FE) MG PO TABS
325.0000 mg | ORAL_TABLET | Freq: Three times a day (TID) | ORAL | Status: DC
  Administered 2016-10-15 (×2): 325 mg via ORAL
  Filled 2016-10-14 (×2): qty 1

## 2016-10-14 MED ORDER — FENTANYL CITRATE (PF) 250 MCG/5ML IJ SOLN
INTRAMUSCULAR | Status: DC | PRN
  Administered 2016-10-14 (×5): 50 ug via INTRAVENOUS

## 2016-10-14 MED ORDER — EPHEDRINE 5 MG/ML INJ
INTRAVENOUS | Status: AC
Start: 2016-10-14 — End: 2016-10-14
  Filled 2016-10-14: qty 10

## 2016-10-14 MED ORDER — METOCLOPRAMIDE HCL 5 MG/ML IJ SOLN: 5.0000 mg | Freq: Three times a day (TID) | INTRAMUSCULAR | Status: DC | PRN

## 2016-10-14 MED ORDER — PROPOFOL 10 MG/ML IV BOLUS
INTRAVENOUS | Status: DC | PRN
  Administered 2016-10-14: 20 mg via INTRAVENOUS
  Administered 2016-10-14: 200 mg via INTRAVENOUS

## 2016-10-14 MED ORDER — FENTANYL CITRATE (PF) 250 MCG/5ML IJ SOLN
INTRAMUSCULAR | Status: AC
  Filled 2016-10-14: qty 5

## 2016-10-14 MED ORDER — HYDROMORPHONE HCL 1 MG/ML IJ SOLN
INTRAMUSCULAR | Status: DC | PRN
  Administered 2016-10-14 (×4): 0.5 mg via INTRAVENOUS

## 2016-10-14 MED ORDER — MENTHOL 3 MG MT LOZG: 1.0000 | LOZENGE | OROMUCOSAL | Status: DC | PRN

## 2016-10-14 MED ORDER — DEXTROSE 5 % IV SOLN
500.0000 mg | Freq: Four times a day (QID) | INTRAVENOUS | Status: DC | PRN
  Filled 2016-10-14: qty 5

## 2016-10-14 MED ORDER — HYDROMORPHONE HCL-NACL 0.5-0.9 MG/ML-% IV SOSY: 0.2500 mg | PREFILLED_SYRINGE | INTRAVENOUS | Status: DC | PRN

## 2016-10-14 MED ORDER — SUGAMMADEX SODIUM 500 MG/5ML IV SOLN
INTRAVENOUS | Status: DC | PRN
  Administered 2016-10-14: 250 mg via INTRAVENOUS

## 2016-10-14 MED ORDER — PHENOL 1.4 % MT LIQD: 1.0000 | OROMUCOSAL | Status: DC | PRN

## 2016-10-14 MED ORDER — ALBUMIN HUMAN 5 % IV SOLN
INTRAVENOUS | Status: DC | PRN
  Administered 2016-10-14 (×2): via INTRAVENOUS

## 2016-10-14 MED ORDER — DEXAMETHASONE SODIUM PHOSPHATE 10 MG/ML IJ SOLN
10.0000 mg | Freq: Once | INTRAMUSCULAR | Status: AC
  Administered 2016-10-14: 10 mg via INTRAVENOUS

## 2016-10-14 MED ORDER — SODIUM CHLORIDE 0.9 % IV SOLN
INTRAVENOUS | Status: DC
  Administered 2016-10-14 – 2016-10-15 (×2): via INTRAVENOUS

## 2016-10-14 MED ORDER — LIDOCAINE 2% (20 MG/ML) 5 ML SYRINGE
INTRAMUSCULAR | Status: AC
Start: 2016-10-14 — End: 2016-10-14
  Filled 2016-10-14: qty 5

## 2016-10-14 MED ORDER — ALBUMIN HUMAN 5 % IV SOLN
INTRAVENOUS | Status: AC
  Filled 2016-10-14: qty 250

## 2016-10-14 SURGICAL SUPPLY — 65 items
ADH SKN CLS APL DERMABOND .7 (GAUZE/BANDAGES/DRESSINGS) ×1
BAG DECANTER FOR FLEXI CONT (MISCELLANEOUS) ×2 IMPLANT
BAG SPEC THK2 15X12 ZIP CLS (MISCELLANEOUS) ×1
BAG ZIPLOCK 12X15 (MISCELLANEOUS) ×2 IMPLANT
BLADE SAW SGTL 11.0X1.19X90.0M (BLADE) ×1 IMPLANT
BLADE SAW SGTL 18X1.27X75 (BLADE) ×2 IMPLANT
BRUSH FEMORAL CANAL (MISCELLANEOUS) IMPLANT
CAPT HIP TOTAL 2 ×1 IMPLANT
COVER SURGICAL LIGHT HANDLE (MISCELLANEOUS) ×2 IMPLANT
CUP PINNACLE ACETABULAR SZ 62 (Orthopedic Implant) ×1 IMPLANT
DERMABOND ADVANCED (GAUZE/BANDAGES/DRESSINGS) ×1
DERMABOND ADVANCED .7 DNX12 (GAUZE/BANDAGES/DRESSINGS) ×1 IMPLANT
DRAPE ORTHO SPLIT 77X108 STRL (DRAPES) ×4
DRAPE POUCH INSTRU U-SHP 10X18 (DRAPES) ×2 IMPLANT
DRAPE SURG 17X11 SM STRL (DRAPES) ×2 IMPLANT
DRAPE SURG ORHT 6 SPLT 77X108 (DRAPES) ×2 IMPLANT
DRAPE U-SHAPE 47X51 STRL (DRAPES) ×2 IMPLANT
DRESSING AQUACEL AG SP 3.5X10 (GAUZE/BANDAGES/DRESSINGS) IMPLANT
DRSG AQUACEL AG ADV 3.5X10 (GAUZE/BANDAGES/DRESSINGS) ×1 IMPLANT
DRSG AQUACEL AG ADV 3.5X14 (GAUZE/BANDAGES/DRESSINGS) IMPLANT
DRSG AQUACEL AG SP 3.5X10 (GAUZE/BANDAGES/DRESSINGS)
DURAPREP 26ML APPLICATOR (WOUND CARE) ×2 IMPLANT
ELECT BLADE TIP CTD 4 INCH (ELECTRODE) ×2 IMPLANT
ELECT REM PT RETURN 15FT ADLT (MISCELLANEOUS) ×2 IMPLANT
FACESHIELD WRAPAROUND (MASK) ×8 IMPLANT
FACESHIELD WRAPAROUND OR TEAM (MASK) ×4 IMPLANT
GAUZE SPONGE 2X2 8PLY STRL LF (GAUZE/BANDAGES/DRESSINGS) ×1 IMPLANT
GLOVE BIOGEL M 7.0 STRL (GLOVE) IMPLANT
GLOVE BIOGEL PI IND STRL 7.5 (GLOVE) ×1 IMPLANT
GLOVE BIOGEL PI IND STRL 8.5 (GLOVE) ×1 IMPLANT
GLOVE BIOGEL PI INDICATOR 7.5 (GLOVE) ×1
GLOVE BIOGEL PI INDICATOR 8.5 (GLOVE) ×1
GLOVE ECLIPSE 8.0 STRL XLNG CF (GLOVE) ×4 IMPLANT
GOWN STRL REUS W/TWL LRG LVL3 (GOWN DISPOSABLE) ×3 IMPLANT
GOWN STRL REUS W/TWL XL LVL3 (GOWN DISPOSABLE) ×4 IMPLANT
GRAFT DBM PARTICULATE CANC 15 (Bone Implant) IMPLANT
GRAFT IC CHAMBER LG (Bone Implant) ×2 IMPLANT
HANDPIECE INTERPULSE COAX TIP (DISPOSABLE) ×2
IV SOD CHL 0.9% 1000ML (IV SOLUTION) IMPLANT
MANIFOLD NEPTUNE II (INSTRUMENTS) ×2 IMPLANT
MARKER SKIN DUAL TIP RULER LAB (MISCELLANEOUS) ×2 IMPLANT
NDL SAFETY ECLIPSE 18X1.5 (NEEDLE) ×1 IMPLANT
NEEDLE HYPO 18GX1.5 SHARP (NEEDLE) ×2
NS IRRIG 1000ML POUR BTL (IV SOLUTION) ×3 IMPLANT
PADDING CAST COTTON 6X4 STRL (CAST SUPPLIES) ×2 IMPLANT
POSITIONER SURGICAL ARM (MISCELLANEOUS) ×2 IMPLANT
PRESSURIZER FEMORAL UNIV (MISCELLANEOUS) IMPLANT
SET HNDPC FAN SPRY TIP SCT (DISPOSABLE) IMPLANT
SPONGE GAUZE 2X2 STER 10/PKG (GAUZE/BANDAGES/DRESSINGS)
SPONGE LAP 18X18 X RAY DECT (DISPOSABLE) ×2 IMPLANT
SPONGE LAP 4X18 X RAY DECT (DISPOSABLE) ×1 IMPLANT
STAPLER VISISTAT 35W (STAPLE) ×1 IMPLANT
SUCTION FRAZIER HANDLE 10FR (MISCELLANEOUS) ×2
SUCTION TUBE FRAZIER 10FR DISP (MISCELLANEOUS) ×1 IMPLANT
SUT MNCRL AB 3-0 PS2 18 (SUTURE) ×1 IMPLANT
SUT VIC AB 1 CT1 36 (SUTURE) ×2 IMPLANT
SUT VIC AB 2-0 CT1 27 (SUTURE) ×6
SUT VIC AB 2-0 CT1 TAPERPNT 27 (SUTURE) ×3 IMPLANT
SUT VLOC 180 0 24IN GS25 (SUTURE) ×4 IMPLANT
TOWEL OR 17X26 10 PK STRL BLUE (TOWEL DISPOSABLE) ×4 IMPLANT
TOWER CARTRIDGE SMART MIX (DISPOSABLE) IMPLANT
TRAY FOLEY W/METER SILVER 16FR (SET/KITS/TRAYS/PACK) ×2 IMPLANT
TUBE KAMVAC SUCTION (TUBING) IMPLANT
WATER STERILE IRR 1500ML POUR (IV SOLUTION) ×3 IMPLANT
YANKAUER SUCT BULB TIP 10FT TU (MISCELLANEOUS) ×2 IMPLANT

## 2016-10-14 NOTE — Anesthesia Procedure Notes (Signed)
Procedure Name: Intubation Date/Time: 10/14/2016 1:06 PM Performed by: Warren Lindahl, Virgel Gess Pre-anesthesia Checklist: Patient identified, Emergency Drugs available, Suction available, Patient being monitored and Timeout performed Patient Re-evaluated:Patient Re-evaluated prior to induction Oxygen Delivery Method: Circle system utilized Preoxygenation: Pre-oxygenation with 100% oxygen Induction Type: IV induction Ventilation: Mask ventilation without difficulty Laryngoscope Size: Mac and 4 Grade View: Grade II Tube type: Oral Tube size: 7.5 mm Number of attempts: 2 Airway Equipment and Method: Stylet Placement Confirmation: ETT inserted through vocal cords under direct vision,  positive ETCO2,  CO2 detector and breath sounds checked- equal and bilateral Secured at: 23 cm Tube secured with: Tape Dental Injury: Teeth and Oropharynx as per pre-operative assessment  Comments: Pt required more muscle relaxant.

## 2016-10-14 NOTE — Interval H&P Note (Signed)
History and Physical Interval Note:  10/14/2016 11:21 AM  Jacob Mitchell  has presented today for surgery, with the diagnosis of Failed Left total hip arthroplasty  The various methods of treatment have been discussed with the patient and family. After consideration of risks, benefits and other options for treatment, the patient has consented to  Procedure(s): [place] (Left) as a surgical intervention .  The patient's history has been reviewed, patient examined, no change in status, stable for surgery.  I have reviewed the patient's chart and labs.  Questions were answered to the patient's satisfaction.     Mauri Pole

## 2016-10-14 NOTE — Transfer of Care (Signed)
Immediate Anesthesia Transfer of Care Note  Patient: Jacob Mitchell  Procedure(s) Performed: Procedure(s): TOTAL HIP REVISION POSTERIOR APPROACH (Left)  Patient Location: PACU  Anesthesia Type:General  Level of Consciousness: awake and alert   Airway & Oxygen Therapy: Patient Spontanous Breathing and Patient connected to face mask oxygen  Post-op Assessment: Report given to RN and Post -op Vital signs reviewed and stable  Post vital signs: Reviewed and stable  Last Vitals:  Vitals:   10/14/16 1058  BP: 123/73  Pulse: 68  Resp: 18  Temp: 36.7 C  SpO2: 97%    Last Pain:  Vitals:   10/14/16 1058  TempSrc: Oral         Complications: No apparent anesthesia complications

## 2016-10-14 NOTE — Anesthesia Procedure Notes (Signed)
Date/Time: 10/14/2016 3:36 PM Performed by: Cynda Familia Oxygen Delivery Method: Simple face mask Placement Confirmation: positive ETCO2 and breath sounds checked- equal and bilateral Dental Injury: Teeth and Oropharynx as per pre-operative assessment  Comments: Extubated --- mask AW--- to pacu--- o2 intact

## 2016-10-14 NOTE — Anesthesia Preprocedure Evaluation (Signed)
Anesthesia Evaluation  Patient identified by MRN, date of birth, ID band Patient awake    Reviewed: Allergy & Precautions, NPO status , Patient's Chart, lab work & pertinent test results  Airway Mallampati: II  TM Distance: >3 FB Neck ROM: Full    Dental no notable dental hx.    Pulmonary neg pulmonary ROS,    Pulmonary exam normal breath sounds clear to auscultation       Cardiovascular negative cardio ROS Normal cardiovascular exam Rhythm:Regular Rate:Normal     Neuro/Psych negative neurological ROS  negative psych ROS   GI/Hepatic negative GI ROS, Neg liver ROS,   Endo/Other  negative endocrine ROS  Renal/GU negative Renal ROS  negative genitourinary   Musculoskeletal  (+) Arthritis , Osteoarthritis,    Abdominal   Peds negative pediatric ROS (+)  Hematology negative hematology ROS (+)   Anesthesia Other Findings   Reproductive/Obstetrics negative OB ROS                             Anesthesia Physical Anesthesia Plan  ASA: II  Anesthesia Plan: General   Post-op Pain Management:    Induction: Intravenous  PONV Risk Score and Plan: 3 and Ondansetron, Dexamethasone and Treatment may vary due to age or medical condition  Airway Management Planned:   Additional Equipment:   Intra-op Plan:   Post-operative Plan: Extubation in OR  Informed Consent: I have reviewed the patients History and Physical, chart, labs and discussed the procedure including the risks, benefits and alternatives for the proposed anesthesia with the patient or authorized representative who has indicated his/her understanding and acceptance.   Dental advisory given  Plan Discussed with: CRNA and Surgeon  Anesthesia Plan Comments:         Anesthesia Quick Evaluation

## 2016-10-15 ENCOUNTER — Encounter (HOSPITAL_COMMUNITY): Payer: Self-pay | Admitting: Orthopedic Surgery

## 2016-10-15 DIAGNOSIS — E669 Obesity, unspecified: Secondary | ICD-10-CM | POA: Diagnosis present

## 2016-10-15 LAB — CBC
HCT: 37.6 % — ABNORMAL LOW (ref 39.0–52.0)
Hemoglobin: 12.6 g/dL — ABNORMAL LOW (ref 13.0–17.0)
MCH: 29.4 pg (ref 26.0–34.0)
MCHC: 33.5 g/dL (ref 30.0–36.0)
MCV: 87.6 fL (ref 78.0–100.0)
Platelets: 163 10*3/uL (ref 150–400)
RBC: 4.29 MIL/uL (ref 4.22–5.81)
RDW: 13.3 % (ref 11.5–15.5)
WBC: 10.9 10*3/uL — ABNORMAL HIGH (ref 4.0–10.5)

## 2016-10-15 LAB — BASIC METABOLIC PANEL
Anion gap: 6 (ref 5–15)
BUN: 23 mg/dL — ABNORMAL HIGH (ref 6–20)
CO2: 25 mmol/L (ref 22–32)
Calcium: 8.4 mg/dL — ABNORMAL LOW (ref 8.9–10.3)
Chloride: 103 mmol/L (ref 101–111)
Creatinine, Ser: 1.06 mg/dL (ref 0.61–1.24)
GFR calc Af Amer: >60 mL/min (ref >60)
GFR calc non Af Amer: >60 mL/min (ref >60)
Glucose, Bld: 140 mg/dL — ABNORMAL HIGH (ref 65–99)
Potassium: 4.5 mmol/L (ref 3.5–5.1)
Sodium: 134 mmol/L — ABNORMAL LOW (ref 135–145)

## 2016-10-15 MED ORDER — METHOCARBAMOL 500 MG PO TABS: 500.0000 mg | ORAL_TABLET | Freq: Four times a day (QID) | ORAL | 0 refills | Status: DC | PRN

## 2016-10-15 MED ORDER — HYDROCODONE-ACETAMINOPHEN 7.5-325 MG PO TABS: 1.0000 | ORAL_TABLET | ORAL | 0 refills | Status: DC | PRN

## 2016-10-15 MED ORDER — ASPIRIN 81 MG PO CHEW: 81.0000 mg | CHEWABLE_TABLET | Freq: Two times a day (BID) | ORAL | 0 refills | Status: AC

## 2016-10-15 MED ORDER — CELECOXIB 200 MG PO CAPS: 200.0000 mg | ORAL_CAPSULE | Freq: Two times a day (BID) | ORAL | 0 refills | Status: DC

## 2016-10-15 MED ORDER — FERROUS SULFATE 325 (65 FE) MG PO TABS: 325.0000 mg | ORAL_TABLET | Freq: Three times a day (TID) | ORAL | 3 refills | Status: DC

## 2016-10-15 MED ORDER — DOCUSATE SODIUM 100 MG PO CAPS: 100.0000 mg | ORAL_CAPSULE | Freq: Two times a day (BID) | ORAL | 0 refills | Status: DC

## 2016-10-15 MED ORDER — POLYETHYLENE GLYCOL 3350 17 G PO PACK: 17.0000 g | PACK | Freq: Two times a day (BID) | ORAL | 0 refills | Status: DC

## 2016-10-15 NOTE — Progress Notes (Signed)
     Subjective: 1 Day Post-Op Procedure(s) (LRB): TOTAL HIP REVISION POSTERIOR APPROACH (Left)   Patient reports pain as none. No events throughout the night.  Feels that the hip is significantly more stable than it was prior to surgery.  Ready to be discharged home.   Objective:   VITALS:   Vitals:   10/15/16 0204 10/15/16 0525  BP: 134/72 116/64  Pulse: 74 (!) 56  Resp: 15 15  Temp: 97.8 F (36.6 C) 97.8 F (36.6 C)  SpO2: 98% 98%    Dorsiflexion/Plantar flexion intact Incision: dressing C/D/I No cellulitis present Compartment soft  LABS  Recent Labs  10/15/16 0434  HGB 12.6*  HCT 37.6*  WBC 10.9*  PLT 163     Recent Labs  10/15/16 0434  NA 134*  K 4.5  BUN 23*  CREATININE 1.06  GLUCOSE 140*     Assessment/Plan: 1 Day Post-Op Procedure(s) (LRB): TOTAL HIP REVISION POSTERIOR APPROACH (Left) Foley cath d/c'ed Advance diet Up with therapy D/C IV fluids Discharge home Follow up in 2 weeks at [facility]. Follow up with OLIN,Shaquana Buel D in 2 weeks.  Contact information:  [facility] [address], [place] [phone]    Obese (BMI 30-39.9) Estimated body mass index is 33.05 kg/m as calculated from the following:   Height as of this encounter: 6\' 6"  (1.981 m).   Weight as of this encounter: 129.7 kg (286 lb). Patient also counseled that weight may inhibit the healing process Patient counseled that losing weight will help with future health issues         West Pugh. Stanley Lyness   PAC  10/15/2016, 8:36 AM

## 2016-10-15 NOTE — Brief Op Note (Signed)
10/14/2016  2:30PM  PATIENT:  Jacob Mitchell  71 y.o. male  PRE-OPERATIVE DIAGNOSIS:  Failed Left total hip arthroplasty  POST-OPERATIVE DIAGNOSIS:  Failed Left total hip arthroplasty  PROCEDURE:  Procedure(s): TOTAL HIP REVISION POSTERIOR APPROACH (Left)  SURGEON:  Surgeon(s) and Role:    Paralee Cancel, MD - Primary  PHYSICIAN ASSISTANT: Danae Orleans, PA-C  ANESTHESIA:   general  EBL:  800cc  BLOOD ADMINISTERED:none  DRAINS: none   LOCAL MEDICATIONS USED:  NONE  SPECIMEN:  No Specimen  DISPOSITION OF SPECIMEN:  N/A  COUNTS:  YES  TOURNIQUET:  * No tourniquets in log *  DICTATION: .Other Dictation: Dictation Number [id number]  PLAN OF CARE: Admit to inpatient   PATIENT DISPOSITION:  PACU - hemodynamically stable.   Delay start of Pharmacological VTE agent (>24hrs) due to surgical blood loss or risk of bleeding: no

## 2016-10-15 NOTE — Evaluation (Signed)
Occupational Therapy Evaluation Patient Details Name: Jacob Mitchell MRN: [id number] DOB: Jan 13, 1946 Today's Date: 10/15/2016    History of Present Illness 71 y/o male s/p Posterior left hip revision (PMH includes L THA direct anterior 10/17. Please see EPIC for full PMH).   Clinical Impression   Pt admitted as above. Pt spouse reports that she can assist PRN at d/c, he has DME & A/E at home. He was able to state 3/3 Posterior hip precautions (handout was issued and reviewed with pt/spouse re: THP & A/E). Pt ambulated in room for ADL transfers and then in hallway to window and back. Denies pain or further acute OT needs at this time, will sign off.    Follow Up Recommendations  No OT follow up;Supervision - Intermittent    Equipment Recommendations  Other (comment);None recommended by OT (Pt has DME and A/E at home)    Recommendations for Other Services PT consult     Precautions / Restrictions Precautions Precautions: Fall;Posterior Hip Precaution Booklet Issued: Yes (comment) Precaution Comments: Handout issued and reviewed Restrictions Weight Bearing Restrictions: No      Mobility Bed Mobility Overal bed mobility: Modified Independent             General bed mobility comments: Adheres to THP w/o vc's  Transfers Overall transfer level: Needs assistance Equipment used: Rolling walker (2 wheeled) Transfers: Sit to/from [facility] Sit to Stand: Min guard;From elevated surface (Pt has high bed at home secondary to being 6'7") Stand pivot transfers: Supervision;Min guard       General transfer comment: Ocassional vc's to move slowly/slow down    Balance Overall balance assessment: No apparent balance deficits (not formally assessed)                                         ADL either performed or assessed with clinical judgement   ADL Overall ADL's : Needs assistance/impaired     Grooming: Set up;Sitting;Wash/dry hands;Wash/dry  face;Oral care   Upper Body Bathing: Set up;Sitting   Lower Body Bathing: Minimal assistance;Sit to/from stand;With caregiver independent assisting Lower Body Bathing Details (indicate cue type and reason): Pt spouse states that she can assist PRN. Pt also has DME and some A/E at home from previous surgery. Upper Body Dressing : Independent;Sitting   Lower Body Dressing: Minimal assistance;With caregiver independent assisting;Sit to/from stand Lower Body Dressing Details (indicate cue type and reason): Has LH reacher and sock aid at home from previous surgery Toilet Transfer: Supervision/safety;Ambulation;BSC;RW Toilet Transfer Details (indicate cue type and reason): Pt ambulated into bathroom and on/off 3:1 over toilet. VC's/supervision for safety and sequencing initially. Toileting- Clothing Manipulation and Hygiene: Supervision/safety;Sit to/from Nurse, children's Details (indicate cue type and reason): Pt has level entry shower at home Functional mobility during ADLs: Min guard;Caregiver able to provide necessary level of assistance;Rolling walker (Initial cues for safety and sequencing as well as assist with IV/lines) General ADL Comments: Pt has DME and A/E at home. He was able to state 3/3 Posterior hip precautions and a handout was issued and reviewed with pt/spouse re: THP & A/E. Pt ambulated in room for ADL transfers and then in hallway to window and back. Denies pain or further acute OT needs at this time.     Vision Baseline Vision/History: Wears glasses Wears Glasses: At all times Patient Visual Report: No change from baseline  Perception     Praxis      Pertinent Vitals/Pain Pain Assessment: Faces Pain Score: 0-No pain Faces Pain Scale: No hurt     Hand Dominance Right (Has bone spur in neck and first three fingers on right "Don't work")   Extremity/Trunk Assessment Upper Extremity Assessment Upper Extremity Assessment: RUE deficits/detail RUE  Deficits / Details: Bone spur in neck and has difficulty using first three fingers/median nerve innervated digits. Neuropathy   Lower Extremity Assessment Lower Extremity Assessment: Defer to PT evaluation   Cervical / Trunk Assessment Cervical / Trunk Assessment: Normal   Communication Communication Communication: No difficulties   Cognition Arousal/Alertness: Awake/alert Behavior During Therapy: WFL for tasks assessed/performed Overall Cognitive Status: Within Functional Limits for tasks assessed                                     General Comments       Exercises     Shoulder Instructions      Home Living Family/patient expects to be discharged to:: Private residence Living Arrangements: Spouse/significant other Available Help at Discharge: Family Type of Home: House Home Access: Level entry     Home Layout: One level     Bathroom Shower/Tub: Occupational psychologist: Handicapped height     Home Equipment: Environmental consultant - 2 wheels;Bedside commode;Shower seat;Cane - single point          Prior Functioning/Environment Level of Independence: Independent                 OT Problem List:        OT Treatment/Interventions:      OT Goals(Current goals can be found in the care plan section) Acute Rehab OT Goals Patient Stated Goal: Go home later today OT Goal Formulation: All assessment and education complete, DC therapy Time For Goal Achievement: 10/15/16  OT Frequency:     Barriers to D/C:            Co-evaluation              AM-PAC PT "6 Clicks" Daily Activity     Outcome Measure Help from another person eating meals?: None Help from another person taking care of personal grooming?: None Help from another person toileting, which includes using toliet, bedpan, or urinal?: A Little Help from another person bathing (including washing, rinsing, drying)?: A Little Help from another person to put on and taking off regular  upper body clothing?: None Help from another person to put on and taking off regular lower body clothing?: A Little 6 Click Score: 21   End of Session Equipment Utilized During Treatment: Rolling walker Nurse Communication: Mobility status;Other (comment) (Pt grooming and bathing)  Activity Tolerance: Patient tolerated treatment well;No increased pain Patient left: in chair;with call bell/phone within reach;with family/visitor present                   Time: 0824-0901 OT Time Calculation (min): 37 min Charges:  OT General Charges $OT Visit: 1 Visit OT Evaluation $OT Eval Low Complexity: 1 Low OT Treatments $Self Care/Home Management : 8-22 mins G-Codes:     Percell Miller, OTR/L 10/15/16 9:16 AM   [place], Amy Ardath Sax

## 2016-10-15 NOTE — Anesthesia Postprocedure Evaluation (Signed)
Anesthesia Post Note  Patient: Jacob Mitchell  Procedure(s) Performed: Procedure(s) (LRB): TOTAL HIP REVISION POSTERIOR APPROACH (Left)     Patient location during evaluation: PACU Anesthesia Type: General Level of consciousness: awake and alert Pain management: pain level controlled Vital Signs Assessment: post-procedure vital signs reviewed and stable Respiratory status: spontaneous breathing, nonlabored ventilation, respiratory function stable and patient connected to nasal cannula oxygen Cardiovascular status: blood pressure returned to baseline and stable Postop Assessment: no signs of nausea or vomiting Anesthetic complications: no    Last Vitals:  Vitals:   10/15/16 0204 10/15/16 0525  BP: 134/72 116/64  Pulse: 74 (!) 56  Resp: 15 15  Temp: 36.6 C 36.6 C  SpO2: 98% 98%    Last Pain:  Vitals:   10/15/16 1057  TempSrc:   PainSc: 0-No pain                 Eliya Bubar S

## 2016-10-15 NOTE — Care Management Note (Signed)
Case Management Note  Patient Details  Name: Jacob Mitchell MRN: [id number] Date of Birth: [birth year]  Subjective/Objective: 71 y/o m admitted w/L hip revision. From home. Has all dme. Pt-no PT needed. No further CM needs.                   Action/Plan:d/c home.   Expected Discharge Date:  10/15/16               Expected Discharge Plan:  Home/Self Care  In-House Referral:     Discharge planning Services  CM Consult  Post Acute Care Choice:  Durable Medical Equipment (Has cane,3n1,rw) Choice offered to:     DME Arranged:    DME Agency:     HH Arranged:    HH Agency:     Status of Service:  Completed, signed off  If discussed at [employer], dates discussed:    Additional Comments:  Dessa Phi, RN 10/15/2016, 9:55 AM

## 2016-10-15 NOTE — Evaluation (Signed)
Physical Therapy Evaluation Patient Details Name: Jacob Mitchell MRN: [id number] DOB: [birth year] Today's Date: 10/15/2016   History of Present Illness  71 y/o male s/p Posterior left hip revision with hx of L THA direct anterior 10/17  Clinical Impression  Patient evaluated by Physical Therapy with no further acute PT needs identified. All education has been completed and the patient has no further questions.  Pt recalls posterior hip precautions well.  Pt reports ambulation much improved since surgery.  Pt performed and educated on HEP.  Pt does not feel he needs f/u therapy at this time since he has hx of multiple surgeries. See below for any follow-up Physical Therapy or equipment needs. PT is signing off. Thank you for this referral.    Follow Up Recommendations No PT follow up    Equipment Recommendations  None recommended by PT    Recommendations for Other Services       Precautions / Restrictions Precautions Precautions: Fall;Posterior Hip Precaution Comments: pt able to verbalize all posterior hip precautions Restrictions Other Position/Activity Restrictions: WBAT      Mobility  Bed Mobility Overal bed mobility: Modified Independent             General bed mobility comments: pt up in recliner on arrival  Transfers Overall transfer level: Needs assistance Equipment used: Rolling walker (2 wheeled) Transfers: Sit to/from Stand Sit to Stand: Supervision Stand pivot transfers: Supervision;Min guard       General transfer comment: verbal cues for hand placement and L LE placement  Ambulation/Gait Ambulation/Gait assistance: Min guard;Supervision Ambulation Distance (Feet): 200 Feet Assistive device: Rolling walker (2 wheeled) Gait Pattern/deviations: Step-through pattern;Decreased stride length     General Gait Details: faster pace, encouraged pt to slow down, appears steady with RW, adheres to hip precautions with mobility  Stairs            Wheelchair  Mobility    Modified Rankin (Stroke Patients Only)       Balance Overall balance assessment: No apparent balance deficits (not formally assessed)                                           Pertinent Vitals/Pain Pain Assessment: 0-10 Pain Score: 1  Pain Location: L hip Pain Descriptors / Indicators: Sore Pain Intervention(s): Monitored during session;Repositioned    Home Living Family/patient expects to be discharged to:: Private residence Living Arrangements: Spouse/significant other Available Help at Discharge: Family Type of Home: House Home Access: Level entry     Home Layout: One level Home Equipment: Environmental consultant - 2 wheels;Bedside commode;Shower seat;Cane - single point      Prior Function Level of Independence: Independent               Hand Dominance   Dominant Hand: Right (Has bone spur in neck and first three fingers on right "Don't work")    Extremity/Trunk Assessment        Lower Extremity Assessment Lower Extremity Assessment: LLE deficits/detail LLE Deficits / Details: at least 2+/5 throughout for L hip, able to move against gravity however maintained hip precautions       Communication   Communication: No difficulties  Cognition Arousal/Alertness: Awake/alert Behavior During Therapy: WFL for tasks assessed/performed Overall Cognitive Status: Within Functional Limits for tasks assessed  General Comments      Exercises Total Joint Exercises Ankle Circles/Pumps: Other (comment);AROM;Both;Standing;10 reps (heel raises, all standing exercises performed with RW) Towel Squeeze: AROM;Both;10 reps;Seated Hip ABduction/ADduction: AROM;Left;10 reps;Standing;Supine Long Arc Quad: AROM;Left;10 reps Knee Flexion: AROM;Left;10 reps;Standing Marching in Standing: AROM;Standing;Left;10 reps   Assessment/Plan    PT Assessment Patent does not need any further PT services  PT  Problem List         PT Treatment Interventions      PT Goals (Current goals can be found in the Care Plan section)  Acute Rehab PT Goals Patient Stated Goal: Go home later today PT Goal Formulation: All assessment and education complete, DC therapy    Frequency     Barriers to discharge        Co-evaluation               AM-PAC PT "6 Clicks" Daily Activity  Outcome Measure Difficulty turning over in bed (including adjusting bedclothes, sheets and blankets)?: None Difficulty moving from lying on back to sitting on the side of the bed? : None Difficulty sitting down on and standing up from a chair with arms (e.g., wheelchair, bedside commode, etc,.)?: None Help needed moving to and from a bed to chair (including a wheelchair)?: None Help needed walking in hospital room?: A Little Help needed climbing 3-5 steps with a railing? : A Little 6 Click Score: 22    End of Session   Activity Tolerance: Patient tolerated treatment well Patient left: in chair;with call bell/phone within reach;with family/visitor present   PT Visit Diagnosis: Difficulty in walking, not elsewhere classified (R26.2)    Time: 7867-6720 PT Time Calculation (min) (ACUTE ONLY): 23 min   Charges:   PT Evaluation $PT Eval Low Complexity: 1 Low PT Treatments $Therapeutic Exercise: 8-22 mins   PT G Codes:        {Kati Zonnie Landen, PT, DPT 10/15/2016 Pager: [phone]  York Ram E 10/15/2016, 12:59 PM

## 2016-10-16 NOTE — Op Note (Signed)
Jacob Mitchell, Jacob Mitchell NO.:  [id number]  MEDICAL RECORD NO.:  [id number]  LOCATION:  [facility]                         FACILITY:  [facility]  PHYSICIAN:  Pietro Cassis. Alvan Dame, M.D.  DATE OF BIRTH:  1945/11/13  DATE OF PROCEDURE:  10/14/2016 DATE OF DISCHARGE:                              OPERATIVE REPORT   PREOPERATIVE DIAGNOSIS:  Failed left total hip arthroplasty.  POSTOPERATIVE DIAGNOSIS:  Failed left total hip arthroplasty.  FINDINGS:  See body of dictated operative note.  PROCEDURE:  Revision left total hip arthroplasty.  COMPONENTS USED:  A size 62 Pinnacle revision acetabular shell, a 56 x 36 neutral +4 Ultrex liner, a size 19.5 large body stature, high-offset AML stem with a 36+ 8 delta ceramic ball.  SURGEON:  Pietro Cassis. Alvan Dame, M.D.  ASSISTANT:  Danae Orleans, PA-C.  ANESTHESIA:  General.  SPECIMENS:  None.  COMPLICATIONS:  None.  DRAIN:  None.  BLOOD LOSS:  About 800 mL.  INDICATION FOR PROCEDURE:  Mr. Shakoor is a very pleasant 71 year old male who underwent an index left total hip arthroplasty less than a year ago. This procedure itself was complicated by duration of the procedure, blood loss, as well as challenges of obtaining stability of his hip through anterior approach.  I felt I was able to provide appropriate offset; however, he has sense of instability on his hip even in the postop period.  Nonetheless, he recovered without of any dislocation requiring closed reduction.  He was seen recently in the office with recent onset of squeak in his hip.  Radiographs revealed that concerned about his acetabular liner dissociated resulting of his metal head articulating with his acetabular liner and acetabular shell.  So, it was then scheduled to be done as soon as possible based upon upcoming plans that he has.  The risk of infection, DVT, component failure and persistent problems postoperatively discussed.  I reviewed possible procedures from  everything at the hip from the component position to revising the components to provide as stable a hip as possible.  Consent obtained.  PROCEDURE IN DETAIL:  The patient was brought to the operative theater. Once adequate anesthesia, preoperative antibiotics administered, Ancef 3 g as well as 1 g of tranexamic acid and 10 mg of Decadron, he was positioned into the right lateral decubitus position with left side up. The left lower extremity was then prepped and draped in sterile fashion. A time-out was performed identifying the patient, planned procedure and extremity.  We at this point, elected to proceed with a posterior approach for the hip.  Thus, an incision was marked over the lateral posterior aspect of the hip.  Incision was made.  Soft tissue dissection was carried to the iliotibial band and gluteal fascia, this was incised for posterior approach.  Once the gluteus was split, posterior aspect of the hip exposed.  We identified some evidence of metal-stained fluid with no signs of infection.  With the posterior aspect of the hip exposed, I dislocated the hip and removed the femoral head.  Attention was first directed to removing the femoral component, this was my plan to evaluate for offset and some leg lengths to provide  stability of the hip.  I used an osteotome and was able to remove the proximal end of the femur carefully and was ultimately able to remove the femoral component without significant complication.  Once this was done, I retracted the femur anteriorly based on the wear of the femoral head, again set the acetabular shell.  The acetabulum had to be revised.  I used an Innomed cup explant instrument and was able to remove the previously-placed 56-mm Pinnacle revision shell.  This did require removing one of the screws from the acetabulum.  Once this was done, I prepared the acetabulum, I reamed, and ended up reaming up to 61 mm to try to get more secure rim fit.   Once this was done, we placed 15 mL of cancellous bone graft into the medial aspect of the hip and then impacted the new shell.  This was positioned with orientation about 40 degrees of abduction and at least 20 degrees of forward flexion.  Once this was done, I placed a cancellous screw into the ilium.  The acetabular shell had excellent stability.  Once this was done, I placed a trial liner and then return back to femoral preparation.  At this point, I elected to proceed with an AML stem.  I opened up the proximal femur and began reaming with a 15-mm reamer and then reamed up to a 19 mm reamer where I got good bony chatter.  I selected a 19.5-mm large body stature stem.  We did a trial reduction with the broach.  With this, I found that the hip was stable without the need for an offset liner based on the combined anteversion obtained from both the new acetabular position and femoral oreintation  Given these findings, I selected this as the final components.  We removed the trial components, placed the final 36 x 56-mm, +4 neutral AltrX liner into the 62-mm shell.  The final 19.5 AML large body stature, high-offset stem was then impacted and sat where I had the broach.  I then did repeat trial reduction and ended up selecting the +8 ball. I did this based on his leg length in this lateral position.  He was noted to have a left knee flexion contracture, which could contribute to leg length discrepancy; however, in this position, I felt his leg length was equal as possible with a very stable hip, which was my goal.  At this point, a 36+ 8 ceramic ball was opened, impacted on a clean and dry trunnion and the hip was reduced.  We irrigated the hip throughout the case using the pulse lavage.  At this point, I reapproximated the iliotibial band and gluteal fascia using a combination of #1 Vicryl and 0 Stratafix suture.  The remainder of the wound was closed with 2-0 Vicryl and a 3-0 Monocryl.   The hip was cleaned, dried and dressed sterilely using surgical glue and Aquacel dressing.  He was then woken from anesthesia and brought to the recovery room in stable condition.  Based on the findings intraoperatively, he will be weightbearing as tolerated.  Findings were reviewed with family.     Pietro Cassis Alvan Dame, M.D.     MDO/MEDQ  D:  10/15/2016  T:  10/16/2016  Job:  [id number]

## 2016-10-20 NOTE — Discharge Summary (Signed)
Physician Discharge Summary  Patient ID: Jacob Mitchell MRN: [id number] DOB/AGE: 1945/10/26 71 y.o.  Admit date: 10/14/2016 Discharge date: 10/15/2016   Procedures:  Procedure(s) (LRB): TOTAL HIP REVISION POSTERIOR APPROACH (Left)  Attending Physician:  Dr. Paralee Cancel   Admission Diagnoses:   Left hip squeak after THA  Discharge Diagnoses:  Active Problems:   S/P revision of total hip   Obese  Past Medical History:  Diagnosis Date  . Arthritis   . Complication of anesthesia    pt says he gets "restless" when he is waking up and nurses have had to "hold him down"  . History of kidney stones    hx of    HPI:    Jacob Mitchell, 71 y.o. male, is a pleasant 71 year old male who is now 10-1/2 months after his left total hip arthroplasty. He states that he had been doing fairly well, though admits not doing a lot of activity and exercise. He has about a two to three-day history now of noticing some squeaking in his left hip with every step he takes. He has had no fevers, chills, or night sweats. No instability.  He is seen in the office. He does have an audible squeaking with every step with his left lower extremity. He is otherwise neurovascularly intact. He does walk with a little bit of a Trendelenburg gait.  Ten and a half months after left total hip arthroplasty with concern for polyethylene dissociation resulting in his metal head articulating with the metal shell resulting in the squeak. In the office, an AP pelvis, AP and lateral of his left hip showed stable femoral component and acetabular component; however, there is significant eccentricity to the femoral head within the acetabulum. The shadow of the polyethylene can not be seen; however, seems to be the only explanation for this after only  10-1/2 months. For this reason, Dr. Alvan Dame scheduled him for revision hip surgery.  Risks, benefits and expectations were discussed with the patient.  Risks including but not limited to the risk of  anesthesia, blood clots, nerve damage, blood vessel damage, failure of the prosthesis, infection and up to and including death.  Patient understand the risks, benefits and expectations and wishes to proceed with surgery.   PCP: Bernerd Limbo, MD   Discharged Condition: good  Hospital Course:  Patient underwent the above stated procedure on 10/14/2016. Patient tolerated the procedure well and brought to the recovery room in good condition and subsequently to the floor.  POD #1 BP: 116/64 ; Pulse: 56 ; Temp: 97.8 F (36.6 C) ; Resp: 15 Patient reports pain as none. No events throughout the night.  Feels that the hip is significantly more stable than it was prior to surgery.  Ready to be discharged home. Dorsiflexion/plantar flexion intact, incision: dressing C/D/I, no cellulitis present and compartment soft.   LABS  Basename    HGB     12.6  HCT     37.6    Discharge Exam: General appearance: alert, cooperative and no distress Extremities: Homans sign is negative, no sign of DVT, no edema, redness or tenderness in the calves or thighs and no ulcers, gangrene or trophic changes  Disposition: Home with follow up in 2 weeks   Follow-up Information    Paralee Cancel, MD. Schedule an appointment as soon as possible for a visit in 2 week(s).   Specialty:  Orthopedic Surgery Contact information: [address] [place] [address] [phone]  Discharge Instructions    Call MD / Call 911    Complete by:  As directed    If you experience chest pain or shortness of breath, CALL 911 and be transported to the hospital emergency room.  If you develope a fever above 101 F, pus (white drainage) or increased drainage or redness at the wound, or calf pain, call your surgeon's office.   Change dressing    Complete by:  As directed    Maintain surgical dressing until follow up in the clinic. If the edges start to pull up, may reinforce with tape. If the dressing is  no longer working, may remove and cover with gauze and tape, but must keep the area dry and clean.  Call with any questions or concerns.   Constipation Prevention    Complete by:  As directed    Drink plenty of fluids.  Prune juice may be helpful.  You may use a stool softener, such as Colace (over the counter) 100 mg twice a day.  Use MiraLax (over the counter) for constipation as needed.   Diet - low sodium heart healthy    Complete by:  As directed    Discharge instructions    Complete by:  As directed    Maintain surgical dressing until follow up in the clinic. If the edges start to pull up, may reinforce with tape. If the dressing is no longer working, may remove and cover with gauze and tape, but must keep the area dry and clean.  Follow up in 2 weeks at [facility]. Call with any questions or concerns.   Increase activity slowly as tolerated    Complete by:  As directed    Weight bearing as tolerated with assist device (walker, cane, etc) as directed, use it as long as suggested by your surgeon or therapist, typically at least 4-6 weeks.   TED hose    Complete by:  As directed    Use stockings (TED hose) for 2 weeks on both leg(s).  You may remove them at night for sleeping.      Allergies as of 10/15/2016      Reactions   Aspartame And Phenylalanine Palpitations   Artificial sweetener- hrt 140  went to ED      Medication List    STOP taking these medications   oseltamivir 75 MG capsule Commonly known as:  TAMIFLU     TAKE these medications   aspirin 81 MG chewable tablet Chew 1 tablet (81 mg total) by mouth 2 (two) times daily. Take for 4 weeks, then resume regular dose. What changed:  when to take this  additional instructions   benzonatate 100 MG capsule Commonly known as:  TESSALON Take 1 capsule (100 mg total) by mouth every 8 (eight) hours.   celecoxib 200 MG capsule Commonly known as:  CELEBREX Take 1 capsule (200 mg total) by mouth every 12  (twelve) hours.   docusate sodium 100 MG capsule Commonly known as:  COLACE Take 1 capsule (100 mg total) by mouth 2 (two) times daily. What changed:  additional instructions   ferrous sulfate 325 (65 FE) MG tablet Take 1 tablet (325 mg total) by mouth 3 (three) times daily after meals. What changed:  Another medication with the same name was removed. Continue taking this medication, and follow the directions you see here.   guaiFENesin-codeine 100-10 MG/5ML syrup Commonly known as:  VIRTUSSIN A/C Take 5-10 mLs by mouth 3 (three) times daily as needed  for cough or congestion.   HYDROcodone-acetaminophen 7.5-325 MG tablet Commonly known as:  NORCO Take 1-2 tablets by mouth every 4 (four) hours as needed for moderate pain. What changed:  Another medication with the same name was removed. Continue taking this medication, and follow the directions you see here.   methocarbamol 500 MG tablet Commonly known as:  ROBAXIN Take 1 tablet (500 mg total) by mouth every 6 (six) hours as needed for muscle spasms. What changed:  Another medication with the same name was removed. Continue taking this medication, and follow the directions you see here.   polyethylene glycol packet Commonly known as:  MIRALAX / GLYCOLAX Take 17 g by mouth 2 (two) times daily. What changed:  additional instructions   traZODone 50 MG tablet Commonly known as:  DESYREL Take 50 mg by mouth at bedtime.            Discharge Care Instructions        Start     Ordered   10/15/16 0000  aspirin 81 MG chewable tablet  2 times daily    Question:  Supervising Provider  Answer:  Paralee Cancel   10/15/16 0842   10/15/16 0000  celecoxib (CELEBREX) 200 MG capsule  Every 12 hours    Question:  Supervising Provider  Answer:  Paralee Cancel   10/15/16 0842   10/15/16 0000  docusate sodium (COLACE) 100 MG capsule  2 times daily    Question:  Supervising Provider  Answer:  Paralee Cancel   10/15/16 0842   10/15/16 0000   ferrous sulfate 325 (65 FE) MG tablet  3 times daily after meals    Question:  Supervising Provider  Answer:  Paralee Cancel   10/15/16 0842   10/15/16 0000  HYDROcodone-acetaminophen (NORCO) 7.5-325 MG tablet  Every 4 hours PRN    Question:  Supervising Provider  Answer:  Paralee Cancel   10/15/16 0842   10/15/16 0000  methocarbamol (ROBAXIN) 500 MG tablet  Every 6 hours PRN    Question:  Supervising Provider  Answer:  Paralee Cancel   10/15/16 0842   10/15/16 0000  polyethylene glycol (MIRALAX / Floria Raveling) packet  2 times daily    Question:  Supervising Provider  Answer:  Paralee Cancel   10/15/16 0842   10/15/16 0000  Call MD / Call 911    Comments:  If you experience chest pain or shortness of breath, CALL 911 and be transported to the hospital emergency room.  If you develope a fever above 101 F, pus (white drainage) or increased drainage or redness at the wound, or calf pain, call your surgeon's office.   10/15/16 0086   10/15/16 0000  Discharge instructions    Comments:  Maintain surgical dressing until follow up in the clinic. If the edges start to pull up, may reinforce with tape. If the dressing is no longer working, may remove and cover with gauze and tape, but must keep the area dry and clean.  Follow up in 2 weeks at [facility]. Call with any questions or concerns.   10/15/16 0842   10/15/16 0000  Diet - low sodium heart healthy     10/15/16 0842   10/15/16 0000  Constipation Prevention    Comments:  Drink plenty of fluids.  Prune juice may be helpful.  You may use a stool softener, such as Colace (over the counter) 100 mg twice a day.  Use MiraLax (over the counter) for constipation as needed.   10/15/16  0842   10/15/16 0000  Increase activity slowly as tolerated    Comments:  Weight bearing as tolerated with assist device (walker, cane, etc) as directed, use it as long as suggested by your surgeon or therapist, typically at least 4-6 weeks.   10/15/16 0842   10/15/16  0000  Change dressing    Comments:  Maintain surgical dressing until follow up in the clinic. If the edges start to pull up, may reinforce with tape. If the dressing is no longer working, may remove and cover with gauze and tape, but must keep the area dry and clean.  Call with any questions or concerns.   10/15/16 0842   10/15/16 0000  TED hose    Comments:  Use stockings (TED hose) for 2 weeks on both leg(s).  You may remove them at night for sleeping.   10/15/16 3943       Signed: [place]. Chamberlain Steinborn   PA-C  10/20/2016, 12:11 PM

## 2016-10-29 DIAGNOSIS — Z471 Aftercare following joint replacement surgery: Secondary | ICD-10-CM | POA: Diagnosis not present

## 2016-10-29 DIAGNOSIS — Z96642 Presence of left artificial hip joint: Secondary | ICD-10-CM | POA: Diagnosis not present

## 2016-11-17 DIAGNOSIS — Z23 Encounter for immunization: Secondary | ICD-10-CM | POA: Diagnosis not present

## 2016-11-26 DIAGNOSIS — Z471 Aftercare following joint replacement surgery: Secondary | ICD-10-CM | POA: Diagnosis not present

## 2016-11-26 DIAGNOSIS — Z96642 Presence of left artificial hip joint: Secondary | ICD-10-CM | POA: Diagnosis not present

## 2017-05-31 ENCOUNTER — Other Ambulatory Visit: Payer: Self-pay

## 2017-05-31 ENCOUNTER — Encounter (HOSPITAL_COMMUNITY): Payer: Self-pay

## 2017-05-31 ENCOUNTER — Emergency Department (HOSPITAL_COMMUNITY)
Admission: EM | Admit: 2017-05-31 | Discharge: 2017-05-31 | Disposition: A | Discharge status: Home / Self Care | Payer: Medicare Other | Attending: Emergency Medicine | Admitting: Emergency Medicine

## 2017-05-31 DIAGNOSIS — N390 Urinary tract infection, site not specified: Secondary | ICD-10-CM

## 2017-05-31 DIAGNOSIS — Z96642 Presence of left artificial hip joint: Secondary | ICD-10-CM | POA: Insufficient documentation

## 2017-05-31 DIAGNOSIS — R3915 Urgency of urination: Secondary | ICD-10-CM | POA: Diagnosis not present

## 2017-05-31 DIAGNOSIS — R339 Retention of urine, unspecified: Secondary | ICD-10-CM | POA: Diagnosis present

## 2017-05-31 DIAGNOSIS — Z79899 Other long term (current) drug therapy: Secondary | ICD-10-CM | POA: Insufficient documentation

## 2017-05-31 DIAGNOSIS — Z87898 Personal history of other specified conditions: Secondary | ICD-10-CM

## 2017-05-31 HISTORY — DX: Personal history of other specified conditions: Z87.898

## 2017-05-31 LAB — URINALYSIS, ROUTINE W REFLEX MICROSCOPIC
Bilirubin Urine: NEGATIVE
Glucose, UA: NEGATIVE mg/dL
Ketones, ur: NEGATIVE mg/dL
Nitrite: NEGATIVE
Protein, ur: 30 mg/dL — AB
Specific Gravity, Urine: 1.024 (ref 1.005–1.030)
pH: 5 (ref 5.0–8.0)

## 2017-05-31 LAB — CBC WITH DIFFERENTIAL/PLATELET
Basophils Absolute: 0 10*3/uL (ref 0.0–0.1)
Basophils Relative: 0 %
Eosinophils Absolute: 0 10*3/uL (ref 0.0–0.7)
Eosinophils Relative: 0 %
HCT: 45.3 % (ref 39.0–52.0)
Hemoglobin: 15 g/dL (ref 13.0–17.0)
Lymphocytes Relative: 4 %
Lymphs Abs: 0.6 10*3/uL — ABNORMAL LOW (ref 0.7–4.0)
MCH: 29 pg (ref 26.0–34.0)
MCHC: 33.1 g/dL (ref 30.0–36.0)
MCV: 87.5 fL (ref 78.0–100.0)
Monocytes Absolute: 1 10*3/uL (ref 0.1–1.0)
Monocytes Relative: 7 %
Neutro Abs: 12.9 10*3/uL — ABNORMAL HIGH (ref 1.7–7.7)
Neutrophils Relative %: 89 %
Platelets: 123 10*3/uL — ABNORMAL LOW (ref 150–400)
RBC: 5.18 MIL/uL (ref 4.22–5.81)
RDW: 14.1 % (ref 11.5–15.5)
WBC: 14.5 10*3/uL — ABNORMAL HIGH (ref 4.0–10.5)

## 2017-05-31 LAB — COMPREHENSIVE METABOLIC PANEL
ALT: 13 U/L — ABNORMAL LOW (ref 17–63)
AST: 15 U/L (ref 15–41)
Albumin: 3.7 g/dL (ref 3.5–5.0)
Alkaline Phosphatase: 69 U/L (ref 38–126)
Anion gap: 10 (ref 5–15)
BUN: 26 mg/dL — ABNORMAL HIGH (ref 6–20)
CO2: 20 mmol/L — ABNORMAL LOW (ref 22–32)
Calcium: 8.7 mg/dL — ABNORMAL LOW (ref 8.9–10.3)
Chloride: 105 mmol/L (ref 101–111)
Creatinine, Ser: 1.28 mg/dL — ABNORMAL HIGH (ref 0.61–1.24)
GFR calc Af Amer: >60 mL/min (ref >60)
GFR calc non Af Amer: 54 mL/min — ABNORMAL LOW (ref >60)
Glucose, Bld: 118 mg/dL — ABNORMAL HIGH (ref 65–99)
Potassium: 3.8 mmol/L (ref 3.5–5.1)
Sodium: 135 mmol/L (ref 135–145)
Total Bilirubin: 1.3 mg/dL — ABNORMAL HIGH (ref 0.3–1.2)
Total Protein: 6.8 g/dL (ref 6.5–8.1)

## 2017-05-31 MED ORDER — SODIUM CHLORIDE 0.9 % IV SOLN
1.0000 g | Freq: Once | INTRAVENOUS | Status: AC
  Administered 2017-05-31: 1 g via INTRAVENOUS
  Filled 2017-05-31: qty 10

## 2017-05-31 MED ORDER — CEPHALEXIN 500 MG PO CAPS: 500.0000 mg | ORAL_CAPSULE | Freq: Four times a day (QID) | ORAL | 0 refills | Status: DC

## 2017-05-31 MED ORDER — SODIUM CHLORIDE 0.9 % IV BOLUS
1000.0000 mL | Freq: Once | INTRAVENOUS | Status: AC
  Administered 2017-05-31: 1000 mL via INTRAVENOUS

## 2017-05-31 NOTE — ED Triage Notes (Signed)
Pt states that he has been unable to pee more than a few dribbles since Friday. When he can urinate however, it is not painful.

## 2017-05-31 NOTE — Discharge Instructions (Signed)
Prescription for antibiotic to start this evening.  Call the urology office in the morning for a follow-up appointment.

## 2017-06-01 DIAGNOSIS — R338 Other retention of urine: Secondary | ICD-10-CM | POA: Diagnosis not present

## 2017-06-02 LAB — URINE CULTURE: Culture: >=100000 — AB

## 2017-06-03 ENCOUNTER — Telehealth: Payer: Self-pay | Admitting: Emergency Medicine

## 2017-06-03 NOTE — Telephone Encounter (Signed)
Post ED Visit - Positive Culture Follow-up  Culture report reviewed by antimicrobial stewardship pharmacist:  []  Elenor Quinones, Pharm.D. []  Heide Guile, Pharm.D., BCPS AQ-ID []  Parks Neptune, Pharm.D., BCPS []  Alycia Rossetti, Pharm.D., BCPS []  [place], [place].D., BCPS, AAHIVP []  Legrand Como, Pharm.D., BCPS, AAHIVP []  Salome Arnt, PharmD, BCPS []  Jalene Mullet, PharmD []  Vincenza Hews, PharmD, BCPS Jimmy Footman PharmD  Positive urine culture Treated with cephalexin, organism sensitive to the same and no further patient follow-up is required at this time.  Hazle Nordmann 06/03/2017, 12:45 PM

## 2017-06-03 NOTE — ED Provider Notes (Signed)
[facility] DEPT Provider Note   CSN: [id number] Arrival date & time: 05/31/17  3474     History   Chief Complaint Chief Complaint  Patient presents with  . Urinary Retention    HPI Jacob Mitchell is a 72 y.o. male.  Patient reports sweats and chills on Friday evening with associated decreased urinary flow, dribbling, urgency to urinate.  Past urological history includes kidney stones and remote UTI.  No flank pain.  He is alert and ambulatory.  Severity of symptoms is moderate.  Nothing makes symptoms better or worse.     Past Medical History:  Diagnosis Date  . Arthritis   . Complication of anesthesia    pt says he gets "restless" when he is waking up and nurses have had to "hold him down"  . History of kidney stones    hx of    Patient Active Problem List   Diagnosis Date Noted  . Obese 10/15/2016  . 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 S/P left THA, AA 11/23/2015    Past Surgical History:  Procedure Laterality Date  . CYST REMOVAL LEG Right   . [place]   x2  . HAND SURGERY  1968  . LAMINECTOMY  1970  . LUMBAR FUSION     L1-L5  . ROTATOR CUFF REPAIR     5 surgeries in 9 days  . SHOULDER SURGERY Left 1976  . TOTAL HIP ARTHROPLASTY Left 11/23/2015   Procedure: LEFT TOTAL HIP ARTHROPLASTY ANTERIOR APPROACH;  Surgeon: Paralee Cancel, MD;  Location: [facility] ORS;  Service: Orthopedics;  Laterality: Left;  . TOTAL HIP REVISION     10/14/16 Dr. Alvan Dame  . TOTAL HIP REVISION Left 10/14/2016   Procedure: TOTAL HIP REVISION POSTERIOR APPROACH;  Surgeon: Paralee Cancel, MD;  Location: [facility] ORS;  Service: Orthopedics;  Laterality: Left;        Home Medications    Prior to Admission medications   Medication Sig Start Date End Date Taking? Authorizing Provider  traZODone (DESYREL) 100 MG tablet Take 100 mg by mouth at bedtime. 05/25/17  Yes [provider]  benzonatate (TESSALON) 100 MG capsule Take 1 capsule (100 mg total) by  mouth every 8 (eight) hours. Patient not taking: Reported on 10/08/2016 05/14/16   Noe Gens, PA-C  celecoxib (CELEBREX) 200 MG capsule Take 1 capsule (200 mg total) by mouth every 12 (twelve) hours. Patient not taking: Reported on 05/31/2017 10/15/16   Danae Orleans, PA-C  cephALEXin (KEFLEX) 500 MG capsule Take 1 capsule (500 mg total) by mouth 4 (four) times daily. 2 capsules twice a day for 7 days. 05/31/17   Nat Christen, MD  docusate sodium (COLACE) 100 MG capsule Take 1 capsule (100 mg total) by mouth 2 (two) times daily. Patient not taking: Reported on 05/31/2017 10/15/16   Danae Orleans, PA-C  ferrous sulfate 325 (65 FE) MG tablet Take 1 tablet (325 mg total) by mouth 3 (three) times daily after meals. Patient not taking: Reported on 05/31/2017 10/15/16   Danae Orleans, PA-C  guaiFENesin-codeine (VIRTUSSIN A/C) 100-10 MG/5ML syrup Take 5-10 mLs by mouth 3 (three) times daily as needed for cough or congestion. Patient not taking: Reported on 10/08/2016 05/14/16   Noe Gens, PA-C  HYDROcodone-acetaminophen [facility]) 7.5-325 MG tablet Take 1-2 tablets by mouth every 4 (four) hours as needed for moderate pain. Patient not taking: Reported on 05/31/2017 10/15/16   Danae Orleans, PA-C  methocarbamol (ROBAXIN) 500 MG tablet Take 1  tablet (500 mg total) by mouth every 6 (six) hours as needed for muscle spasms. Patient not taking: Reported on 05/31/2017 10/15/16   Danae Orleans, PA-C  polyethylene glycol (MIRALAX / Floria Raveling) packet Take 17 g by mouth 2 (two) times daily. Patient not taking: Reported on 05/31/2017 10/15/16   Danae Orleans, PA-C    Family History No family history on file.  Social History Social History   Tobacco Use  . Smoking status: Never Smoker  . Smokeless tobacco: Never Used  Substance Use Topics  . Alcohol use: No  . Drug use: No     Allergies   Aspartame and phenylalanine   Review of Systems Review of Systems  All other systems reviewed and are  negative.    Physical Exam Updated Vital Signs BP 130/84   Pulse 75   Temp 98.1 F (36.7 C)   Resp 16   Ht 6\' 6"  (1.981 m)   Wt 132 kg (291 lb)   SpO2 95%   BMI 33.63 kg/m   Physical Exam  Constitutional: He is oriented to person, place, and time. He appears well-developed and well-nourished.  nad  HENT:  Head: Normocephalic and atraumatic.  Eyes: Conjunctivae are normal.  Neck: Neck supple.  Cardiovascular: Normal rate and regular rhythm.  Pulmonary/Chest: Effort normal and breath sounds normal.  Abdominal: Soft. Bowel sounds are normal.  Genitourinary:  Genitourinary Comments: No cvat  Musculoskeletal: Normal range of motion.  Neurological: He is alert and oriented to person, place, and time.  Skin: Skin is warm and dry.  Psychiatric: He has a normal mood and affect. His behavior is normal.  Nursing note and vitals reviewed.    ED Treatments / Results  Labs (all labs ordered are listed, but only abnormal results are displayed) Labs Reviewed  URINE CULTURE - Abnormal; Notable for the following components:      Result Value   Culture >=100,000 COLONIES/mL ESCHERICHIA COLI (*)    Organism ID, Bacteria ESCHERICHIA COLI (*)    All other components within normal limits  CBC WITH DIFFERENTIAL/PLATELET - Abnormal; Notable for the following components:   WBC 14.5 (*)    Platelets 123 (*)    Neutro Abs 12.9 (*)    Lymphs Abs 0.6 (*)    All other components within normal limits  COMPREHENSIVE METABOLIC PANEL - Abnormal; Notable for the following components:   CO2 20 (*)    Glucose, Bld 118 (*)    BUN 26 (*)    Creatinine, Ser 1.28 (*)    Calcium 8.7 (*)    ALT 13 (*)    Total Bilirubin 1.3 (*)    GFR calc non Af Amer 54 (*)    All other components within normal limits  URINALYSIS, ROUTINE W REFLEX MICROSCOPIC - Abnormal; Notable for the following components:   APPearance HAZY (*)    Hgb urine dipstick MODERATE (*)    Protein, ur 30 (*)    Leukocytes, UA  MODERATE (*)    Bacteria, UA MANY (*)    Squamous Epithelial / LPF 0-5 (*)    All other components within normal limits    EKG None  Radiology No results found.  Procedures Procedures (including critical care time)  Medications Ordered in ED Medications  sodium chloride 0.9 % bolus 1,000 mL (0 mLs Intravenous Stopped 05/31/17 1227)  cefTRIAXone (ROCEPHIN) 1 g in sodium chloride 0.9 % 100 mL IVPB (0 g Intravenous Stopped 05/31/17 1335)     Initial Impression / Assessment and  Plan / ED Course  I have reviewed the triage vital signs and the nursing notes.  Pertinent labs & imaging results that were available during my care of the patient were reviewed by me and considered in my medical decision making (see chart for details).     Patient presents with chills, urgency, dribbling.  He is hemodynamically stable.  Urinalysis shows evidence of infection.  Will initiate Rocephin 1 g IV.  Urine culture.  Discharge medication cephalexin 500 mg.  Follow-up with urology.  Final Clinical Impressions(s) / ED Diagnoses   Final diagnoses:  Urinary tract infection without hematuria, site unspecified    ED Discharge Orders        Ordered    cephALEXin (KEFLEX) 500 MG capsule  4 times daily     05/31/17 1415       Nat Christen, MD 06/03/17 1323

## 2017-06-11 DIAGNOSIS — R338 Other retention of urine: Secondary | ICD-10-CM | POA: Diagnosis not present

## 2017-06-11 DIAGNOSIS — N401 Enlarged prostate with lower urinary tract symptoms: Secondary | ICD-10-CM | POA: Diagnosis not present

## 2017-06-12 DIAGNOSIS — R338 Other retention of urine: Secondary | ICD-10-CM | POA: Diagnosis not present

## 2017-06-12 DIAGNOSIS — N401 Enlarged prostate with lower urinary tract symptoms: Secondary | ICD-10-CM | POA: Diagnosis not present

## 2017-06-19 DIAGNOSIS — N401 Enlarged prostate with lower urinary tract symptoms: Secondary | ICD-10-CM | POA: Diagnosis not present

## 2017-06-19 DIAGNOSIS — R338 Other retention of urine: Secondary | ICD-10-CM | POA: Diagnosis not present

## 2017-06-25 DIAGNOSIS — R338 Other retention of urine: Secondary | ICD-10-CM | POA: Diagnosis not present

## 2017-06-25 DIAGNOSIS — N401 Enlarged prostate with lower urinary tract symptoms: Secondary | ICD-10-CM | POA: Diagnosis not present

## 2017-06-26 ENCOUNTER — Other Ambulatory Visit: Payer: Self-pay | Admitting: Urology

## 2017-07-14 ENCOUNTER — Encounter (HOSPITAL_BASED_OUTPATIENT_CLINIC_OR_DEPARTMENT_OTHER): Payer: Self-pay | Admitting: *Deleted

## 2017-07-14 ENCOUNTER — Other Ambulatory Visit: Payer: Self-pay

## 2017-07-14 NOTE — Progress Notes (Signed)
Spoke w/ pt via phone for pre-op interview.  Npo after mn w/ exception clear liquids until 0815 ( no cream / milk products).  Arrive at [facility].

## 2017-07-20 ENCOUNTER — Ambulatory Visit (HOSPITAL_BASED_OUTPATIENT_CLINIC_OR_DEPARTMENT_OTHER): Payer: Medicare Other | Admitting: Anesthesiology

## 2017-07-20 ENCOUNTER — Encounter (HOSPITAL_BASED_OUTPATIENT_CLINIC_OR_DEPARTMENT_OTHER)
Admission: RE | Disposition: A | Discharge status: Home / Self Care | Payer: Self-pay | Source: Ambulatory Visit | Attending: Urology

## 2017-07-20 ENCOUNTER — Other Ambulatory Visit: Payer: Self-pay

## 2017-07-20 ENCOUNTER — Ambulatory Visit (HOSPITAL_BASED_OUTPATIENT_CLINIC_OR_DEPARTMENT_OTHER)
Admission: RE | Admit: 2017-07-20 | Discharge: 2017-07-20 | Disposition: A | Discharge status: Home / Self Care | Payer: Medicare Other | Source: Ambulatory Visit | Attending: Urology | Admitting: Urology

## 2017-07-20 ENCOUNTER — Encounter (HOSPITAL_BASED_OUTPATIENT_CLINIC_OR_DEPARTMENT_OTHER): Payer: Self-pay | Admitting: Anesthesiology

## 2017-07-20 DIAGNOSIS — Z96642 Presence of left artificial hip joint: Secondary | ICD-10-CM | POA: Diagnosis not present

## 2017-07-20 DIAGNOSIS — R338 Other retention of urine: Secondary | ICD-10-CM | POA: Diagnosis not present

## 2017-07-20 DIAGNOSIS — N320 Bladder-neck obstruction: Secondary | ICD-10-CM | POA: Diagnosis not present

## 2017-07-20 DIAGNOSIS — N138 Other obstructive and reflux uropathy: Secondary | ICD-10-CM | POA: Insufficient documentation

## 2017-07-20 DIAGNOSIS — R339 Retention of urine, unspecified: Secondary | ICD-10-CM | POA: Diagnosis present

## 2017-07-20 DIAGNOSIS — Z8614 Personal history of Methicillin resistant Staphylococcus aureus infection: Secondary | ICD-10-CM | POA: Insufficient documentation

## 2017-07-20 DIAGNOSIS — N401 Enlarged prostate with lower urinary tract symptoms: Secondary | ICD-10-CM | POA: Diagnosis not present

## 2017-07-20 HISTORY — DX: Mononeuropathy, unspecified: G58.9

## 2017-07-20 HISTORY — DX: Personal history of colon polyps, unspecified: Z86.0100

## 2017-07-20 HISTORY — DX: Unspecified osteoarthritis, unspecified site: M19.90

## 2017-07-20 HISTORY — DX: Retention of urine, unspecified: R33.9

## 2017-07-20 HISTORY — DX: Personal history of other specified conditions: Z87.898

## 2017-07-20 HISTORY — DX: Personal history of colonic polyps: Z86.010

## 2017-07-20 HISTORY — DX: Injury of unspecified nerves of neck, initial encounter: S14.9XXA

## 2017-07-20 HISTORY — PX: CYSTOSCOPY WITH INSERTION OF UROLIFT: SHX6678

## 2017-07-20 HISTORY — DX: Benign prostatic hyperplasia with lower urinary tract symptoms: N40.1

## 2017-07-20 HISTORY — DX: Presence of external hearing-aid: Z97.4

## 2017-07-20 HISTORY — DX: Presence of spectacles and contact lenses: Z97.3

## 2017-07-20 HISTORY — DX: Personal history of Methicillin resistant Staphylococcus aureus infection: Z86.14

## 2017-07-20 HISTORY — DX: Polyneuropathy, unspecified: G62.9

## 2017-07-20 HISTORY — DX: Diverticulosis of large intestine without perforation or abscess without bleeding: K57.30

## 2017-07-20 SURGERY — CYSTOSCOPY WITH INSERTION OF UROLIFT
Anesthesia: General

## 2017-07-20 MED ORDER — PHENYLEPHRINE HCL 10 MG/ML IJ SOLN: INTRAVENOUS | Status: DC | PRN

## 2017-07-20 MED ORDER — MEPERIDINE HCL 25 MG/ML IJ SOLN
6.2500 mg | INTRAMUSCULAR | Status: DC | PRN
  Filled 2017-07-20: qty 1

## 2017-07-20 MED ORDER — HYDROCODONE-ACETAMINOPHEN 5-325 MG PO TABS: 1.0000 | ORAL_TABLET | Freq: Four times a day (QID) | ORAL | 0 refills | Status: DC | PRN

## 2017-07-20 MED ORDER — STERILE WATER FOR IRRIGATION IR SOLN
Status: DC | PRN
  Administered 2017-07-20: 3000 mL via INTRAVESICAL

## 2017-07-20 MED ORDER — OXYCODONE HCL 5 MG/5ML PO SOLN
5.0000 mg | Freq: Once | ORAL | Status: DC | PRN
  Filled 2017-07-20: qty 5

## 2017-07-20 MED ORDER — PROPOFOL 10 MG/ML IV BOLUS
INTRAVENOUS | Status: DC | PRN
  Administered 2017-07-20: 300 mg via INTRAVENOUS

## 2017-07-20 MED ORDER — PROMETHAZINE HCL 25 MG/ML IJ SOLN
6.2500 mg | INTRAMUSCULAR | Status: DC | PRN
  Filled 2017-07-20: qty 1

## 2017-07-20 MED ORDER — CEFAZOLIN SODIUM-DEXTROSE 1-4 GM/50ML-% IV SOLN
INTRAVENOUS | Status: AC
  Filled 2017-07-20: qty 50

## 2017-07-20 MED ORDER — PHENYLEPHRINE 40 MCG/ML (10ML) SYRINGE FOR IV PUSH (FOR BLOOD PRESSURE SUPPORT)
PREFILLED_SYRINGE | INTRAVENOUS | Status: DC | PRN
  Administered 2017-07-20: 80 ug via INTRAVENOUS

## 2017-07-20 MED ORDER — OXYCODONE HCL 5 MG PO TABS
5.0000 mg | ORAL_TABLET | Freq: Once | ORAL | Status: DC | PRN
  Filled 2017-07-20: qty 1

## 2017-07-20 MED ORDER — PHENYLEPHRINE 40 MCG/ML (10ML) SYRINGE FOR IV PUSH (FOR BLOOD PRESSURE SUPPORT)
PREFILLED_SYRINGE | INTRAVENOUS | Status: AC
  Filled 2017-07-20: qty 10

## 2017-07-20 MED ORDER — CEFAZOLIN SODIUM-DEXTROSE 2-4 GM/100ML-% IV SOLN
INTRAVENOUS | Status: AC
  Filled 2017-07-20: qty 100

## 2017-07-20 MED ORDER — LIDOCAINE 2% (20 MG/ML) 5 ML SYRINGE
INTRAMUSCULAR | Status: DC | PRN
  Administered 2017-07-20: 100 mg via INTRAVENOUS

## 2017-07-20 MED ORDER — ONDANSETRON HCL 4 MG/2ML IJ SOLN
INTRAMUSCULAR | Status: DC | PRN
  Administered 2017-07-20: 4 mg via INTRAVENOUS

## 2017-07-20 MED ORDER — FENTANYL CITRATE (PF) 100 MCG/2ML IJ SOLN
INTRAMUSCULAR | Status: DC | PRN
  Administered 2017-07-20 (×2): 50 ug via INTRAVENOUS

## 2017-07-20 MED ORDER — CEFAZOLIN SODIUM-DEXTROSE 2-4 GM/100ML-% IV SOLN
2.0000 g | INTRAVENOUS | Status: AC
  Administered 2017-07-20: 3 g via INTRAVENOUS
  Filled 2017-07-20: qty 100

## 2017-07-20 MED ORDER — EPHEDRINE 5 MG/ML INJ
INTRAVENOUS | Status: AC
  Filled 2017-07-20: qty 10

## 2017-07-20 MED ORDER — KETOROLAC TROMETHAMINE 30 MG/ML IJ SOLN
INTRAMUSCULAR | Status: DC | PRN
  Administered 2017-07-20: 30 mg via INTRAVENOUS

## 2017-07-20 MED ORDER — EPHEDRINE SULFATE-NACL 50-0.9 MG/10ML-% IV SOSY
PREFILLED_SYRINGE | INTRAVENOUS | Status: DC | PRN
  Administered 2017-07-20 (×2): 10 mg via INTRAVENOUS

## 2017-07-20 MED ORDER — HYDROMORPHONE HCL 1 MG/ML IJ SOLN
0.2500 mg | INTRAMUSCULAR | Status: DC | PRN
  Filled 2017-07-20: qty 0.5

## 2017-07-20 MED ORDER — LACTATED RINGERS IV SOLN
INTRAVENOUS | Status: DC
  Administered 2017-07-20: 13:00:00 via INTRAVENOUS
  Filled 2017-07-20: qty 1000

## 2017-07-20 MED ORDER — DEXAMETHASONE SODIUM PHOSPHATE 10 MG/ML IJ SOLN
INTRAMUSCULAR | Status: DC | PRN
  Administered 2017-07-20: 10 mg via INTRAVENOUS

## 2017-07-20 SURGICAL SUPPLY — 14 items
BAG DRAIN URO-CYSTO SKYTR STRL (DRAIN) ×2 IMPLANT
BAG DRN UROCATH (DRAIN) ×1
BAG URINE DRAINAGE (UROLOGICAL SUPPLIES) ×2 IMPLANT
CATH FOLEY 2WAY SLVR  5CC 16FR (CATHETERS) ×1
CATH FOLEY 2WAY SLVR 5CC 16FR (CATHETERS) ×1 IMPLANT
CLOTH BEACON ORANGE TIMEOUT ST (SAFETY) ×2 IMPLANT
GLOVE BIO SURGEON STRL SZ8 (GLOVE) ×2 IMPLANT
GOWN STRL REUS W/TWL XL LVL3 (GOWN DISPOSABLE) ×2 IMPLANT
HOLDER FOLEY CATH W/STRAP (MISCELLANEOUS) ×2 IMPLANT
IV NS IRRIG 3000ML ARTHROMATIC (IV SOLUTION) ×4 IMPLANT
MANIFOLD NEPTUNE II (INSTRUMENTS) ×2 IMPLANT
PACK CYSTO (CUSTOM PROCEDURE TRAY) ×2 IMPLANT
SYSTEM UROLIFT (Male Continence) ×4 IMPLANT
TUBE CONNECTING 12X1/4 (SUCTIONS) ×2 IMPLANT

## 2017-07-20 NOTE — Op Note (Signed)
   PREOPERATIVE DIAGNOSIS: Benign prostatic hypertrophy with bladder outlet obstruction and urinary retention  POSTOPERATIVE DIAGNOSIS: Benign prostatic hypertrophy with bladder outlet obstruction and urinary retention.  PROCEDURE: Cystoscopy with implantation of UroLift devices, 4 implants.  SURGEON: Nicolette Bang, M.D.  ANESTHESIA: General  ANTIBIOTICS: ancef  SPECIMEN: None.  DRAINS: A 16-French Foley catheter.  BLOOD LOSS: Minimal.  COMPLICATIONS: None.  INDICATIONS:The Patient is an 72 year old white male with BPH and bladder outlet obstruction. He has failed medical therapy and has elected UroLift for definitive treatment.  FINDINGS OF PROCEDURE: He was taken to the operating room where a genral anesthetic was induced. He was placed in lithotomy position and was fitted with PAS hose. His perineum and genitalia were prepped with chlorhexidine, and he was draped in usual sterile fashion.  Cystoscopy was performed using the UroLift scope and 0 degree lens. Examination revealed a normal urethra. The external sphincter was intact. Prostatic urethra was approximately 4 cm in length with lateral lobe enlargement. There was also little bit of bladder neck elevation. Inspection of bladder revealed mild-to-moderate trabeculation with no tumors, stones, or inflammation. No cellules or diverticula were noted. Ureteral orifices were in their normal anatomic position effluxing clear urine.  After initial cystoscopy, the visual obturator was replaced with the first UroLift device. This was turned to the 9 o'clock position and pulled back to the veru and then slightly advanced. Pressure was then applied to the right lateral lobe and the UroLift device was deployed.  The second UroLift device was then inserted and applied to the left lateral lobe at 3 o'clock and deployed in the mid prostatic urethra. After this, there was still some apparent obstruction  closer to the bladder neck. So a second level of UroLift your left device was applied between the mid urethra and the proximal urethra providing further patency to the prostatic urethra. At this point, there was mild bleeding but the patient did have a spinal anesthetic. So it was thought that a Foley catheter was indicated. The scope was removed and a 16-French Foley catheter was inserted without difficulty. The balloon was filled with 10 mL sterile fluid, and the catheter was placed to straight drainage.  COMPLICATIONS: None   CONDITION: Stable, extubated, transferred to PACU  PLAN: The patient will be discharged home and followup in 2 days for a voiding trial.

## 2017-07-20 NOTE — Anesthesia Postprocedure Evaluation (Signed)
Anesthesia Post Note  Patient: Zyree Traynham  Procedure(s) Performed: CYSTOSCOPY WITH INSERTION OF UROLIFT (N/A )     Patient location during evaluation: PACU Anesthesia Type: General Level of consciousness: awake and alert Pain management: pain level controlled Vital Signs Assessment: post-procedure vital signs reviewed and stable Respiratory status: spontaneous breathing, nonlabored ventilation and respiratory function stable Cardiovascular status: blood pressure returned to baseline and stable Postop Assessment: no apparent nausea or vomiting Anesthetic complications: no    Last Vitals:  Vitals:   07/20/17 1445 07/20/17 1451  BP: 133/86 138/80  Pulse: (!) 58 (!) 57  Resp: 18 13  Temp:    SpO2: 95% 91%    Last Pain:  Vitals:   07/20/17 1451  TempSrc:   PainSc: 0-No pain                 Lynda Rainwater

## 2017-07-20 NOTE — Transfer of Care (Signed)
Immediate Anesthesia Transfer of Care Note  Patient: Jacob Mitchell  Procedure(s) Performed: CYSTOSCOPY WITH INSERTION OF UROLIFT (N/A )  Patient Location: PACU  Anesthesia Type:General  Level of Consciousness: awake, alert  and oriented  Airway & Oxygen Therapy: Patient Spontanous Breathing and Patient connected to nasal cannula oxygen  Post-op Assessment: Report given to RN and Post -op Vital signs reviewed and stable  Post vital signs: Reviewed and stable  Last Vitals:  Vitals Value Taken Time  BP 130/89 07/20/2017  2:16 PM  Temp 36.5 C 07/20/2017  2:15 PM  Pulse 63 07/20/2017  2:19 PM  Resp 18 07/20/2017  2:19 PM  SpO2 95 % 07/20/2017  2:19 PM  Vitals shown include unvalidated device data.  Last Pain:  Vitals:   07/20/17 1215  TempSrc: Oral      Patients Stated Pain Goal: 7 (57/26/20 3559)  Complications: No apparent anesthesia complications

## 2017-07-20 NOTE — Anesthesia Procedure Notes (Signed)
Procedure Name: LMA Insertion Date/Time: 07/20/2017 1:36 PM Performed by: Gwyndolyn Saxon, CRNA Pre-anesthesia Checklist: Patient identified, Emergency Drugs available, Suction available, Patient being monitored and Timeout performed Patient Re-evaluated:Patient Re-evaluated prior to induction Oxygen Delivery Method: Circle system utilized Preoxygenation: Pre-oxygenation with 100% oxygen Induction Type: IV induction Ventilation: Mask ventilation without difficulty and Oral airway inserted - appropriate to patient size LMA: LMA with gastric port inserted LMA Size: 5.0 Number of attempts: 2 Placement Confirmation: positive ETCO2,  CO2 detector and breath sounds checked- equal and bilateral Tube secured with: Tape Dental Injury: Teeth and Oropharynx as per pre-operative assessment  Comments: LMA straight #5 inserted; unable to generate good TV. Removed and LMA #5 with gastric port inserted. Adequate TV.

## 2017-07-20 NOTE — H&P (Signed)
Urology Admission H&P  Chief Complaint: urinary retention  History of Present Illness: Jacob Mitchell is a 72yo with a hx of BPH who developed urinary retention. He has failed medical therapy. He presents today for Urolift  Past Medical History:  Diagnosis Date  . Benign localized prostatic hyperplasia with lower urinary tract symptoms (LUTS)   . Complication of anesthesia    pt says he gets "restless" when he is waking up and nurses have had to "hold him down"  . Diverticulosis of colon   . History of colon polyps   . History of kidney stones   . History of MRSA infection 2007   post right shoulder surgery  . History of urinary retention 05/31/2017   due to BPH  . Incomplete emptying of bladder   . OA (osteoarthritis)   . Peripheral neuropathy    feet-- hx frost bite  . Pinched nerve in neck    right index and middle fingers numbness  . Wears glasses   . Wears hearing aid in both ears    Past Surgical History:  Procedure Laterality Date  . CYST REMOVAL LEG Right 1975  . DISTAL CLAVICLE EXCISION Left 1977   left shoulder  . ELBOW SURGERY Right 1969;  1980  . HAND SURGERY  1968  . LAMINECTOMY  1970   L4-5  . LUMBAR FUSION  2013  approx.   L1--L5  . ROTATOR CUFF REPAIR Right x5    last one 2007   5 surgeries in 9 days--- original repair then 4 sx'  I&D sx's for MRSA infection  . SHOULDER ARTHROSCOPY WITH DISTAL CLAVICLE RESECTION Left 1977  . SHOULDER SURGERY Left 1976  . TOTAL HIP ARTHROPLASTY Left 11/23/2015   Procedure: LEFT TOTAL HIP ARTHROPLASTY ANTERIOR APPROACH;  Surgeon: Paralee Cancel, MD;  Location: [facility];  Service: Orthopedics;  Laterality: Left;  . TOTAL HIP REVISION     10/14/16 Dr. Alvan Dame  . TOTAL HIP REVISION Left 10/14/2016   Procedure: TOTAL HIP REVISION POSTERIOR APPROACH;  Surgeon: Paralee Cancel, MD;  Location: [facility];  Service: Orthopedics;  Laterality: Left;    Home Medications:  Current Facility-Administered Medications  Medication Dose Route Frequency  Provider Last Rate Last Dose  . ceFAZolin (ANCEF) IVPB 2g/100 mL premix  2 g Intravenous 30 min Pre-Op Dearies Meikle, Candee Furbish, MD      . lactated ringers infusion   Intravenous Continuous Audry Pili, MD 50 mL/hr at 07/20/17 1258     Allergies:  Allergies  Allergen Reactions  . Aspartame And Phenylalanine Palpitations    Artificial sweetener- hrt 140  went to ED    History reviewed. No pertinent family history. Social History:  reports that he has never smoked. He has never used smokeless tobacco. He reports that he does not drink alcohol or use drugs.  Review of Systems  All other systems reviewed and are negative.   Physical Exam:  Vital signs in last 24 hours: Temp:  [97.6 F (36.4 C)] 97.6 F (36.4 C) (06/10 1215) Pulse Rate:  [60] 60 (06/10 1215) Resp:  [17] 17 (06/10 1215) BP: (139)/(83) 139/83 (06/10 1215) SpO2:  [98 %] 98 % (06/10 1215) Weight:  [137.8 kg (303 lb 14.4 oz)] 137.8 kg (303 lb 14.4 oz) (06/10 1215) Physical Exam  Constitutional: He is oriented to person, place, and time. He appears well-developed and well-nourished.  HENT:  Head: Normocephalic and atraumatic.  Eyes: Pupils are equal, round, and reactive to light. EOM are normal.  Neck: Normal  range of motion. No thyromegaly present.  Cardiovascular: Normal rate and regular rhythm.  Respiratory: Effort normal and breath sounds normal.  GI: Soft. He exhibits no distension.  Musculoskeletal: Normal range of motion. He exhibits no edema.  Neurological: He is alert and oriented to person, place, and time.  Skin: Skin is warm and dry.  Psychiatric: He has a normal mood and affect. His behavior is normal. Judgment and thought content normal.    Laboratory Data:  No results found for this or any previous visit (from the past 24 hour(s)). No results found for this or any previous visit (from the past 240 hour(s)). Creatinine: No results for input(s): CREATININE in the last 168 hours. Baseline Creatinine:  unknwon  Impression/Assessment:  71yo with BPH with LUTS, urinary retention  Plan:  The risks/benefits/alternatives to Urolift was explained to the patient and he understands and wishes to proceed with surgery  Nicolette Bang 07/20/2017, 1:15 PM

## 2017-07-20 NOTE — Discharge Instructions (Signed)
Indwelling Urinary Catheter Care, Adult Take good care of your catheter to keep it working and to prevent problems. How to wear your catheter Attach your catheter to your leg with tape (adhesive tape) or a leg strap. Make sure it is not too tight. If you use tape, remove any bits of tape that are already on the catheter. How to wear a drainage bag You should have:  A large overnight bag.  A small leg bag.  Overnight Bag You may wear the overnight bag at any time. Always keep the bag below the level of your bladder but off the floor. When you sleep, put a clean plastic bag in a wastebasket. Then hang the bag inside the wastebasket. Leg Bag Never wear the leg bag at night. Always wear the leg bag below your knee. Keep the leg bag secure with a leg strap or tape. How to care for your skin  Clean the skin around the catheter at least once every day.  Shower every day. Do not take baths.  Put creams, lotions, or ointments on your genital area only as told by your doctor.  Do not use powders, sprays, or lotions on your genital area. How to clean your catheter and your skin 1. Wash your hands with soap and water. 2. Wet a washcloth in warm water and gentle (mild) soap. 3. Use the washcloth to clean the skin where the catheter enters your body. Clean downward and wipe away from the catheter in small circles. Do not wipe toward the catheter. 4. Pat the area dry with a clean towel. Make sure to clean off all soap. How to care for your drainage bags Empty your drainage bag when it is ?- full or at least 2-3 times a day. Replace your drainage bag once a month or sooner if it starts to smell bad or look dirty. Do not clean your drainage bag unless told by your doctor. Emptying a drainage bag  Supplies Needed  Rubbing alcohol.  Gauze pad or cotton ball.  Tape or a leg strap.  Steps 1. Wash your hands with soap and water. 2. Separate (detach) the bag from your leg. 3. Hold the bag over  the toilet or a clean container. Keep the bag below your hips and bladder. This stops pee (urine) from going back into the tube. 4. Open the pour spout at the bottom of the bag. 5. Empty the pee into the toilet or container. Do not let the pour spout touch any surface. 6. Put rubbing alcohol on a gauze pad or cotton ball. 7. Use the gauze pad or cotton ball to clean the pour spout. 8. Close the pour spout. 9. Attach the bag to your leg with tape or a leg strap. 10. Wash your hands.  Changing a drainage bag Supplies Needed  Alcohol wipes.  A clean drainage bag.  Adhesive tape or a leg strap.  Steps 1. Wash your hands with soap and water. 2. Separate the dirty bag from your leg. 3. Pinch the rubber catheter with your fingers so that pee does not spill out. 4. Separate the catheter tube from the drainage tube where these tubes connect (at the connection valve). Do not let the tubes touch any surface. 5. Clean the end of the catheter tube with an alcohol wipe. Use a different alcohol wipe to clean the end of the drainage tube. 6. Connect the catheter tube to the drainage tube of the clean bag. 7. Attach the new bag to  the leg with adhesive tape or a leg strap. 8. Wash your hands.  How to prevent infection and other problems  Never pull on your catheter or try to remove it. Pulling can damage tissue in your body.  Always wash your hands before and after touching your catheter.  If a leg strap gets wet, replace it with a dry one.  Drink enough fluids to keep your pee clear or pale yellow, or as told by your doctor.  Do not let the drainage bag or tubing touch the floor.  Wear cotton underwear.  If you are male, wipe from front to back after you poop (have a bowel movement).  Check on the catheter often to make sure it works and the tubing is not twisted. Get help if:  Your pee is cloudy.  Your pee smells unusually bad.  Your pee is not draining into the bag.  Your  tube gets clogged.  Your catheter starts to leak.  Your bladder feels full. Get help right away if:  You have redness, swelling, or pain where the catheter enters your body.  You have fluid, pus, or a bad smell coming from the area where the catheter enters your body.  The area where the catheter enters your body feels warm.  You have a fever.  You have pain in your: ? Stomach (abdomen). ? Legs. ? Lower back. ? Bladder.  You see blood fill the catheter.  Your pee is pink or red.  You feel sick to your stomach (nauseous).  You throw up (vomit).  You have chills.  Your catheter gets pulled out. This information is not intended to replace advice given to you by your health care provider. Make sure you discuss any questions you have with your health care provider. Document Released: 05/24/2012 Document Revised: 12/26/2015 Document Reviewed: 07/12/2013 Elsevier Interactive Patient Education  2018 [place] not take any nonsteroidal anti inflammatories(Ibupeofen, Advil, Motrin, Aleve) until after 7:30 pm today.     Post Anesthesia Home Care Instructions  Activity: Get plenty of rest for the remainder of the day. A responsible individual must stay with you for 24 hours following the procedure.  For the next 24 hours, DO NOT: -Drive a car -Paediatric nurse -Drink alcoholic beverages -Take any medication unless instructed by your physician -Make any legal decisions or sign important papers.  Meals: Start with liquid foods such as gelatin or soup. Progress to regular foods as tolerated. Avoid greasy, spicy, heavy foods. If nausea and/or vomiting occur, drink only clear liquids until the nausea and/or vomiting subsides. Call your physician if vomiting continues.  Special Instructions/Symptoms: Your throat may feel dry or sore from the anesthesia or the breathing tube placed in your throat during surgery. If this causes discomfort, gargle with warm salt water.  The discomfort should disappear within 24 hours.

## 2017-07-20 NOTE — Anesthesia Preprocedure Evaluation (Signed)
Anesthesia Evaluation  Patient identified by MRN, date of birth, ID band Patient awake    Reviewed: Allergy & Precautions, NPO status , Patient's Chart, lab work & pertinent test results  Airway Mallampati: II  TM Distance: >3 FB Neck ROM: Full    Dental no notable dental hx.    Pulmonary neg pulmonary ROS,    Pulmonary exam normal breath sounds clear to auscultation       Cardiovascular negative cardio ROS Normal cardiovascular exam Rhythm:Regular Rate:Normal     Neuro/Psych negative neurological ROS  negative psych ROS   GI/Hepatic negative GI ROS, Neg liver ROS,   Endo/Other  negative endocrine ROS  Renal/GU negative Renal ROS  negative genitourinary   Musculoskeletal  (+) Arthritis , Osteoarthritis,    Abdominal   Peds negative pediatric ROS (+)  Hematology negative hematology ROS (+)   Anesthesia Other Findings   Reproductive/Obstetrics negative OB ROS                             Anesthesia Physical  Anesthesia Plan  ASA: II  Anesthesia Plan: General   Post-op Pain Management:    Induction: Intravenous  PONV Risk Score and Plan: 3 and Ondansetron, Dexamethasone and Treatment may vary due to age or medical condition  Airway Management Planned: LMA  Additional Equipment:   Intra-op Plan:   Post-operative Plan: Extubation in OR  Informed Consent: I have reviewed the patients History and Physical, chart, labs and discussed the procedure including the risks, benefits and alternatives for the proposed anesthesia with the patient or authorized representative who has indicated his/her understanding and acceptance.   Dental advisory given  Plan Discussed with: CRNA and Surgeon  Anesthesia Plan Comments:         Anesthesia Quick Evaluation

## 2017-07-21 ENCOUNTER — Encounter (HOSPITAL_BASED_OUTPATIENT_CLINIC_OR_DEPARTMENT_OTHER): Payer: Self-pay | Admitting: Urology

## 2017-07-22 DIAGNOSIS — N138 Other obstructive and reflux uropathy: Secondary | ICD-10-CM | POA: Diagnosis not present

## 2017-07-22 DIAGNOSIS — N401 Enlarged prostate with lower urinary tract symptoms: Secondary | ICD-10-CM | POA: Diagnosis not present

## 2017-07-24 ENCOUNTER — Encounter (HOSPITAL_BASED_OUTPATIENT_CLINIC_OR_DEPARTMENT_OTHER): Payer: Self-pay | Admitting: Urology

## 2017-08-31 DIAGNOSIS — R351 Nocturia: Secondary | ICD-10-CM | POA: Diagnosis not present

## 2017-08-31 DIAGNOSIS — N401 Enlarged prostate with lower urinary tract symptoms: Secondary | ICD-10-CM | POA: Diagnosis not present

## 2017-10-20 ENCOUNTER — Ambulatory Visit: Payer: Medicare Other | Admitting: Family Medicine

## 2017-10-21 ENCOUNTER — Ambulatory Visit (INDEPENDENT_AMBULATORY_CARE_PROVIDER_SITE_OTHER): Payer: Medicare Other | Admitting: Family Medicine

## 2017-10-21 ENCOUNTER — Encounter: Payer: Self-pay | Admitting: Family Medicine

## 2017-10-21 VITALS — BP 136/78 | HR 74 | Temp 98.5°F | Ht 78.0 in | Wt 310.6 lb

## 2017-10-21 DIAGNOSIS — Z13220 Encounter for screening for lipoid disorders: Secondary | ICD-10-CM

## 2017-10-21 DIAGNOSIS — G4700 Insomnia, unspecified: Secondary | ICD-10-CM

## 2017-10-21 DIAGNOSIS — G629 Polyneuropathy, unspecified: Secondary | ICD-10-CM | POA: Diagnosis not present

## 2017-10-21 DIAGNOSIS — E6609 Other obesity due to excess calories: Secondary | ICD-10-CM

## 2017-10-21 LAB — COMPREHENSIVE METABOLIC PANEL
ALT: 15 U/L (ref 0–53)
AST: 15 U/L (ref 0–37)
Albumin: 4.3 g/dL (ref 3.5–5.2)
Alkaline Phosphatase: 81 U/L (ref 39–117)
BUN: 25 mg/dL — ABNORMAL HIGH (ref 6–23)
CO2: 25 mEq/L (ref 19–32)
Calcium: 9.3 mg/dL (ref 8.4–10.5)
Chloride: 106 mEq/L (ref 96–112)
Creatinine, Ser: 1.35 mg/dL (ref 0.40–1.50)
GFR: 54.88 mL/min — ABNORMAL LOW (ref >60.00)
Glucose, Bld: 113 mg/dL — ABNORMAL HIGH (ref 70–99)
Potassium: 4.2 mEq/L (ref 3.5–5.1)
Sodium: 138 mEq/L (ref 135–145)
Total Bilirubin: 0.5 mg/dL (ref 0.2–1.2)
Total Protein: 7 g/dL (ref 6.0–8.3)

## 2017-10-21 LAB — LIPID PANEL
Cholesterol: 141 mg/dL (ref 0–200)
HDL: 37.4 mg/dL — ABNORMAL LOW (ref >39.00)
LDL Cholesterol: 68 mg/dL (ref 0–99)
NonHDL: 103.11
Total CHOL/HDL Ratio: 4
Triglycerides: 176 mg/dL — ABNORMAL HIGH (ref 0.0–149.0)
VLDL: 35.2 mg/dL (ref 0.0–40.0)

## 2017-10-21 NOTE — Assessment & Plan Note (Signed)
Stable, denies any worsening pain or weakness.   Will continue to monitor for now.

## 2017-10-21 NOTE — Progress Notes (Signed)
Jacob Mitchell - 72 y.o. male MRN [id number]  Date of birth: 1945-12-12  Subjective Chief Complaint  Patient presents with  . New Patient (Initial Visit)    est care, CPE    HPI Jacob Mitchell is a 72 y.o.  male with history of multiple orthopedic injuries and surgerieshere today to establish care.  He is a retired Investment banker, operational.  He feels that other than chronic wear and tear injuries that he is in relatively good health.  Has numbness and tingling in his fingers of R hand due to cervical spurring, advised by surgeon to not have this operated on unless worsening.  He is followed by a urologist for prostate issues and had a urolift procedure completed previously.    He does take trazodone for sleep.  He would like to have any age appropriate screenings completed today.    ROS:  A comprehensive ROS was completed and negative except as noted per HPI  Allergies  Allergen Reactions  . Aspartame And Phenylalanine Palpitations    Artificial sweetener- hrt 140  went to ED    Past Medical History:  Diagnosis Date  . Benign localized prostatic hyperplasia with lower urinary tract symptoms (LUTS)   . Complication of anesthesia    pt says he gets "restless" when he is waking up and nurses have had to "hold him down"  . Diverticulosis of colon   . History of colon polyps   . History of kidney stones   . History of MRSA infection 2007   post right shoulder surgery  . History of urinary retention 05/31/2017   due to BPH  . Incomplete emptying of bladder   . OA (osteoarthritis)   . Peripheral neuropathy    feet-- hx frost bite  . Pinched nerve in neck    right index and middle fingers numbness  . Wears glasses   . Wears hearing aid in both ears     Past Surgical History:  Procedure Laterality Date  . CYST REMOVAL LEG Right 1975  . CYSTOSCOPY WITH INSERTION OF UROLIFT N/A 07/20/2017   Procedure: CYSTOSCOPY WITH INSERTION OF UROLIFT;  Surgeon: Cleon Gustin, MD;   Location: [facility];  Service: Urology;  Laterality: N/A;  . DISTAL CLAVICLE EXCISION Left 1977   left shoulder  . ELBOW SURGERY Right 1969;  1980  . HAND SURGERY  1968  . LAMINECTOMY  1970   L4-5  . LUMBAR FUSION  2013  approx.   L1--L5  . ROTATOR CUFF REPAIR Right x5    last one 2007   5 surgeries in 9 days--- original repair then 4 sx'  I&D sx's for MRSA infection  . SHOULDER ARTHROSCOPY WITH DISTAL CLAVICLE RESECTION Left 1977  . SHOULDER SURGERY Left 1976  . TOTAL HIP ARTHROPLASTY Left 11/23/2015   Procedure: LEFT TOTAL HIP ARTHROPLASTY ANTERIOR APPROACH;  Surgeon: Paralee Cancel, MD;  Location: [facility];  Service: Orthopedics;  Laterality: Left;  . TOTAL HIP REVISION     10/14/16 Dr. Alvan Dame  . TOTAL HIP REVISION Left 10/14/2016   Procedure: TOTAL HIP REVISION POSTERIOR APPROACH;  Surgeon: Paralee Cancel, MD;  Location: [facility];  Service: Orthopedics;  Laterality: Left;    Social History   Socioeconomic History  . Marital status: Married    Spouse name: Not on file  . Number of children: Not on file  . Years of education: Not on file  . Highest education level: Not on file  Occupational History  .  Not on file  Social Needs  . Financial resource strain: Not on file  . Food insecurity:    Worry: Not on file    Inability: Not on file  . Transportation needs:    Medical: Not on file    Non-medical: Not on file  Tobacco Use  . Smoking status: Never Smoker  . Smokeless tobacco: Never Used  Substance and Sexual Activity  . Alcohol use: No  . Drug use: No  . Sexual activity: Yes  Lifestyle  . Physical activity:    Days per week: Not on file    Minutes per session: Not on file  . Stress: Not on file  Relationships  . Social connections:    Talks on phone: Not on file    Gets together: Not on file    Attends religious service: Not on file    Active member of club or organization: Not on file    Attends meetings of clubs or organizations: Not on file     Relationship status: Not on file  Other Topics Concern  . Not on file  Social History Narrative  . Not on file    Family History  Problem Relation Age of Onset  . Alcohol abuse Father   . Drug abuse Brother     Health Maintenance  Topic Date Due  . Hepatitis C Screening  December 08, 1945  . TETANUS/TDAP  07/31/1962  . COLONOSCOPY  07/30/1993  . PNA vac Low Risk Adult (1 of 2 - PCV13) 07/30/2008  . INFLUENZA VACCINE  09/10/2017    ----------------------------------------------------------------------------------------------------------------------------------------------------------------------------------------------------------------- Physical Exam BP 136/78 (BP Location: Left Arm, Patient Position: Sitting, Cuff Size: Large)   Pulse 74   Temp 98.5 F (36.9 C) (Oral)   Ht 6\' 6"  (1.981 m)   Wt (!) 310 lb 9.6 oz (140.9 kg)   SpO2 97%   BMI 35.89 kg/m   Physical Exam  Constitutional: He is oriented to person, place, and time. He appears well-nourished. No distress.  HENT:  Head: Normocephalic and atraumatic.  Mouth/Throat: Oropharynx is clear and moist.  Eyes: No scleral icterus.  Neck: Neck supple. No thyromegaly present.  Cardiovascular: Normal rate, regular rhythm and normal heart sounds.  Pulmonary/Chest: Effort normal and breath sounds normal.  Abdominal: Soft. Bowel sounds are normal. He exhibits no distension. There is no tenderness.  Musculoskeletal: He exhibits no edema.  Lymphadenopathy:    He has no cervical adenopathy.  Neurological: He is alert and oriented to person, place, and time. No cranial nerve deficit.  Skin: Skin is warm and dry.  Psychiatric: He has a normal mood and affect. His behavior is normal.    ------------------------------------------------------------------------------------------------------------------------------------------------------------------------------------------------------------------- Assessment and  Plan  Neuropathy Stable, denies any worsening pain or weakness.   Will continue to monitor for now.   Obese Lipid and glucose screening ordered today He plans on working on weight loss, has goal to get back to around 270-280 lbs.   Insomnia Doing well with trazodone, continue.

## 2017-10-21 NOTE — Patient Instructions (Signed)

## 2017-10-21 NOTE — Assessment & Plan Note (Signed)
Lipid and glucose screening ordered today He plans on working on weight loss, has goal to get back to around 270-280 lbs.

## 2017-10-21 NOTE — Assessment & Plan Note (Signed)
Doing well with trazodone, continue.

## 2017-11-05 ENCOUNTER — Telehealth: Payer: Self-pay | Admitting: Emergency Medicine

## 2017-11-05 NOTE — Telephone Encounter (Signed)
Nurse visit appointment has been scheduled for 11/06/17 at 9:00 AM to receive high dose flu shot.

## 2017-11-05 NOTE — Telephone Encounter (Signed)
Spoke with patient regarding scheduling a nurse visit for the high dose flu vaccine. Patient would like to come in 11/06/2017 at 9:00am. This is the only time patient will be in town.

## 2017-11-06 ENCOUNTER — Ambulatory Visit (INDEPENDENT_AMBULATORY_CARE_PROVIDER_SITE_OTHER): Payer: Medicare Other

## 2017-11-06 DIAGNOSIS — Z23 Encounter for immunization: Secondary | ICD-10-CM | POA: Diagnosis not present

## 2017-11-06 NOTE — Progress Notes (Signed)
Pt presents today for influenza vaccination. Allergies verified. IM injection given in L deltoid. Pt tolerated well. No observed S/S or symptoms prior to leaving office. Vaccination information given to pt.

## 2017-11-27 DIAGNOSIS — N401 Enlarged prostate with lower urinary tract symptoms: Secondary | ICD-10-CM | POA: Diagnosis not present

## 2017-11-27 DIAGNOSIS — R3914 Feeling of incomplete bladder emptying: Secondary | ICD-10-CM | POA: Diagnosis not present

## 2017-12-03 ENCOUNTER — Other Ambulatory Visit: Payer: Self-pay | Admitting: Family Medicine

## 2017-12-03 MED ORDER — TRAZODONE HCL 100 MG PO TABS: 100.0000 mg | ORAL_TABLET | Freq: Every day | ORAL | 6 refills | Status: DC

## 2017-12-03 NOTE — Telephone Encounter (Signed)
Copied from [place] [id number]. Topic: Quick Communication - Rx Refill/Question >> Dec 03, 2017 12:17 PM Cecelia Byars, NT wrote: Medication:  traZODone (DESYREL) 100 MG tablet   Has the patient contacted their pharmacy? yes  (Agent: If no, request that the patient contact the pharmacy for the refill. (Agent: If yes, when and what did the pharmacy advise?  Preferred Pharmacy (with phone number or street name [facility] [place], [place] [phone] (Phone) [phone] (Fax)    Agent: Please be advised that RX refills may take up to 3 business days. We ask that you follow-up with your pharmacy.

## 2017-12-03 NOTE — Telephone Encounter (Signed)
Requested medication (s) are due for refill today: yes  Requested medication (s) are on the active medication list: yes    Last refill: 05/31/17  Future visit scheduled no  Notes to clinic:Historical provider  Requested Prescriptions  Pending Prescriptions Disp Refills   traZODone (DESYREL) 100 MG tablet 30 tablet 0    Sig: Take 1 tablet (100 mg total) by mouth at bedtime.     Psychiatry: Antidepressants - Serotonin Modulator Passed - 12/03/2017 12:30 PM      Passed - Valid encounter within last 6 months    Recent Outpatient Visits          1 month ago Obesity due to excess calories without serious comorbidity, unspecified classification   [facility], [place], [place]

## 2017-12-08 ENCOUNTER — Other Ambulatory Visit: Payer: Self-pay | Admitting: Family Medicine

## 2017-12-08 MED ORDER — TRAZODONE HCL 100 MG PO TABS: 100.0000 mg | ORAL_TABLET | Freq: Every day | ORAL | 6 refills | Status: DC

## 2017-12-08 NOTE — Progress Notes (Signed)
Resent Trazodone since last prescription was sent to [facility].   Rosemarie Ax, MD [facility] 12/08/2017, 1:54 PM

## 2018-05-14 ENCOUNTER — Encounter: Payer: Self-pay | Admitting: Family Medicine

## 2018-05-14 ENCOUNTER — Telehealth: Payer: Self-pay | Admitting: Family Medicine

## 2018-05-14 ENCOUNTER — Ambulatory Visit (INDEPENDENT_AMBULATORY_CARE_PROVIDER_SITE_OTHER): Payer: Medicare Other | Admitting: Family Medicine

## 2018-05-14 DIAGNOSIS — R05 Cough: Secondary | ICD-10-CM | POA: Diagnosis not present

## 2018-05-14 DIAGNOSIS — R059 Cough, unspecified: Secondary | ICD-10-CM | POA: Insufficient documentation

## 2018-05-14 MED ORDER — FLUTICASONE PROPIONATE 50 MCG/ACT NA SUSP: 2.0000 | Freq: Every day | NASAL | 6 refills | Status: DC

## 2018-05-14 MED ORDER — BENZONATATE 100 MG PO CAPS: 100.0000 mg | ORAL_CAPSULE | Freq: Three times a day (TID) | ORAL | 0 refills | Status: DC | PRN

## 2018-05-14 NOTE — Progress Notes (Signed)
Jacob Mitchell - 73 y.o. male MRN [id number]  Date of birth: [birth year]   This visit type was conducted due to national recommendations for restrictions regarding the COVID-19 Pandemic (e.g. social distancing).  This format is felt to be most appropriate for this patient at this time.  All issues noted in this document were discussed and addressed.  No physical exam was performed (except for noted visual exam findings with Video Visits).  I discussed the limitations of evaluation and management by telemedicine and the availability of in person appointments. The patient expressed understanding and agreed to proceed.  I connected with@ on 05/14/18 at  3:00 PM EDT by a video enabled telemedicine application and verified that I am speaking with the correct person using two identifiers.   Patient Location: [place] [address]   Provider location:   Home office  Chief Complaint  Patient presents with  . Cough    Onset 6 days    HPI  Jacob Mitchell is a 73 y.o. male who presents via audio/video conferencing for a telehealth visit today.  He reports cough that started about 6 days ago.  Cough is productive in the morning with clear mucus.  He has had some mild congestion.  Thinks it may be allergy related.  Wife in the background reports he has had chronic issues with cough and frequently clearing his throat.   He denies associated fever, chills, body aches, shortness of breath, sore throat, nausea, vomiting, diarrhea.  He is using zyrtec and tessalon with some relief.    ROS:  A comprehensive ROS was completed and negative except as noted per HPI  Past Medical History:  Diagnosis Date  . Benign localized prostatic hyperplasia with lower urinary tract symptoms (LUTS)   . Complication of anesthesia    pt says he gets "restless" when he is waking up and nurses have had to "hold him down"  . Diverticulosis of colon   . History of colon polyps   . History of kidney stones   .  History of MRSA infection 2007   post right shoulder surgery  . History of urinary retention 05/31/2017   due to BPH  . Incomplete emptying of bladder   . OA (osteoarthritis)   . Peripheral neuropathy    feet-- hx frost bite  . Pinched nerve in neck    right index and middle fingers numbness  . Wears glasses   . Wears hearing aid in both ears     Past Surgical History:  Procedure Laterality Date  . CYST REMOVAL LEG Right 1975  . CYSTOSCOPY WITH INSERTION OF UROLIFT N/A 07/20/2017   Procedure: CYSTOSCOPY WITH INSERTION OF UROLIFT;  Surgeon: Cleon Gustin, MD;  Location: [facility];  Service: Urology;  Laterality: N/A;  . DISTAL CLAVICLE EXCISION Left 1977   left shoulder  . ELBOW SURGERY Right 1969;  1980  . HAND SURGERY  1968  . LAMINECTOMY  1970   L4-5  . LUMBAR FUSION  2013  approx.   L1--L5  . ROTATOR CUFF REPAIR Right x5    last one 2007   5 surgeries in 9 days--- original repair then 4 sx'  I&D sx's for MRSA infection  . SHOULDER ARTHROSCOPY WITH DISTAL CLAVICLE RESECTION Left 1977  . SHOULDER SURGERY Left 1976  . TOTAL HIP ARTHROPLASTY Left 11/23/2015   Procedure: LEFT TOTAL HIP ARTHROPLASTY ANTERIOR APPROACH;  Surgeon: Paralee Cancel, MD;  Location: [facility];  Service: Orthopedics;  Laterality: Left;  . TOTAL HIP REVISION     10/14/16 Dr. Alvan Dame  . TOTAL HIP REVISION Left 10/14/2016   Procedure: TOTAL HIP REVISION POSTERIOR APPROACH;  Surgeon: Paralee Cancel, MD;  Location: [facility];  Service: Orthopedics;  Laterality: Left;    Family History  Problem Relation Age of Onset  . Alcohol abuse Father   . Drug abuse Brother     Social History   Socioeconomic History  . Marital status: Married    Spouse name: Not on file  . Number of children: Not on file  . Years of education: Not on file  . Highest education level: Not on file  Occupational History  . Not on file  Social Needs  . Financial resource strain: Not on file  . Food insecurity:    Worry:  Not on file    Inability: Not on file  . Transportation needs:    Medical: Not on file    Non-medical: Not on file  Tobacco Use  . Smoking status: Never Smoker  . Smokeless tobacco: Never Used  Substance and Sexual Activity  . Alcohol use: No  . Drug use: No  . Sexual activity: Yes  Lifestyle  . Physical activity:    Days per week: Not on file    Minutes per session: Not on file  . Stress: Not on file  Relationships  . Social connections:    Talks on phone: Not on file    Gets together: Not on file    Attends religious service: Not on file    Active member of club or organization: Not on file    Attends meetings of clubs or organizations: Not on file    Relationship status: Not on file  . Intimate partner violence:    Fear of current or ex partner: Not on file    Emotionally abused: Not on file    Physically abused: Not on file    Forced sexual activity: Not on file  Other Topics Concern  . Not on file  Social History Narrative  . Not on file     Current Outpatient Medications:  .  benzonatate (TESSALON) 100 MG capsule, Take 1-2 capsules (100-200 mg total) by mouth 3 (three) times daily as needed for cough., Disp: 45 capsule, Rfl: 0 .  traZODone (DESYREL) 100 MG tablet, Take 1 tablet (100 mg total) by mouth at bedtime., Disp: 30 tablet, Rfl: 6 .  fluticasone (FLONASE) 50 MCG/ACT nasal spray, Place 2 sprays into both nostrils daily., Disp: 16 g, Rfl: 6  EXAM:  VITALS per patient if applicable: There were no vitals taken for this visit.  GENERAL: alert, oriented, appears well and in no acute distress  HEENT: atraumatic, conjunttiva clear, no obvious abnormalities on inspection of external nose and ears  NECK: normal movements of the head and neck  LUNGS: on inspection no signs of respiratory distress, breathing rate appears normal, no obvious gross SOB, gasping or wheezing  CV: no obvious cyanosis  MS: moves all visible extremities without noticeable abnormality   PSYCH/NEURO: pleasant and cooperative, no obvious depression or anxiety, speech and thought processing grossly intact  ASSESSMENT AND PLAN:  Discussed the following assessment and plan:  Cough -Likely related to allergies/post-nasal drainage.  He will start flonase daily.  Continue cetirizine.  Renewal of tessalon sent.   -Low suspicion for COVID-19 given lack of other systemic symptoms.  -Discussed to call back if symptoms worsen or he develops additional symptoms.  I discussed the assessment and treatment plan with the patient. The patient was provided an opportunity to ask questions and all were answered. The patient agreed with the plan and demonstrated an understanding of the instructions.   The patient was advised to call back or seek an in-person evaluation if the symptoms worsen or if the condition fails to improve as anticipated.    Luetta Nutting, DO

## 2018-05-14 NOTE — Assessment & Plan Note (Signed)
-  Likely related to allergies/post-nasal drainage.  He will start flonase daily.  Continue cetirizine.  Renewal of tessalon sent.   -Low suspicion for COVID-19 given lack of other systemic symptoms.  -Discussed to call back if symptoms worsen or he develops additional symptoms.

## 2018-05-14 NOTE — Telephone Encounter (Signed)
Pt has appt. 05/14/2018. Task completed. JAP

## 2018-05-14 NOTE — Telephone Encounter (Signed)
Copied from [place] [id number]. Topic: Quick Communication - Rx Refill/Question >> May 14, 2018 10:04 AM Celene Kras A wrote: Pt is experiencing cold symptoms for 5-6 days and would like to set up a visit. Please advise  Medication: Benzonatate  Has the patient contacted their pharmacy? No. (Agent: If no, request that the patient contact the pharmacy for the refill.) (Agent: If yes, when and what did the pharmacy advise?)  Preferred Pharmacy (with phone number or street name): [facility] [place], [place] [address] [place] [address] Phone: [phone] Fax: [phone] Not a 24 hour pharmacy; exact hours not known.    Agent: Please be advised that RX refills may take up to 3 business days. We ask that you follow-up with your pharmacy.

## 2018-07-09 DIAGNOSIS — K5730 Diverticulosis of large intestine without perforation or abscess without bleeding: Secondary | ICD-10-CM | POA: Diagnosis not present

## 2018-07-09 DIAGNOSIS — Z86010 Personal history of colonic polyps: Secondary | ICD-10-CM | POA: Diagnosis not present

## 2018-07-16 ENCOUNTER — Other Ambulatory Visit: Payer: Self-pay | Admitting: Family Medicine

## 2018-07-22 ENCOUNTER — Telehealth: Payer: Self-pay | Admitting: Family Medicine

## 2018-07-22 ENCOUNTER — Other Ambulatory Visit: Payer: Self-pay | Admitting: Family Medicine

## 2018-07-22 MED ORDER — TRAZODONE HCL 100 MG PO TABS: 100.0000 mg | ORAL_TABLET | Freq: Every day | ORAL | 2 refills | Status: DC

## 2018-07-22 NOTE — Telephone Encounter (Signed)
Please advise.  Ty.

## 2018-07-22 NOTE — Telephone Encounter (Signed)
Medication renewed.

## 2018-07-22 NOTE — Telephone Encounter (Signed)
Patient called office about medication trazodone. Patient needs a rx refill and was seen by you in April 2020. Patient was questioning why does he need an office visit.  It looks like Dr. Raeford Razor declined rx and said he needs an appointment. I did not see that he had an office visit for sports medicine. Please advise and contact patient.

## 2018-08-26 IMAGING — DX DG HIP (WITH OR WITHOUT PELVIS) 1V PORT*L*
2 series · 2 of 2 positions shown · non-contrast
Comparison: 11/23/2015.

CLINICAL DATA: 71-year-old male post left hip replacement today.
Initial encounter.

EXAM:
DG HIP (WITH OR WITHOUT PELVIS) 1V PORT LEFT

[pelvis ap]
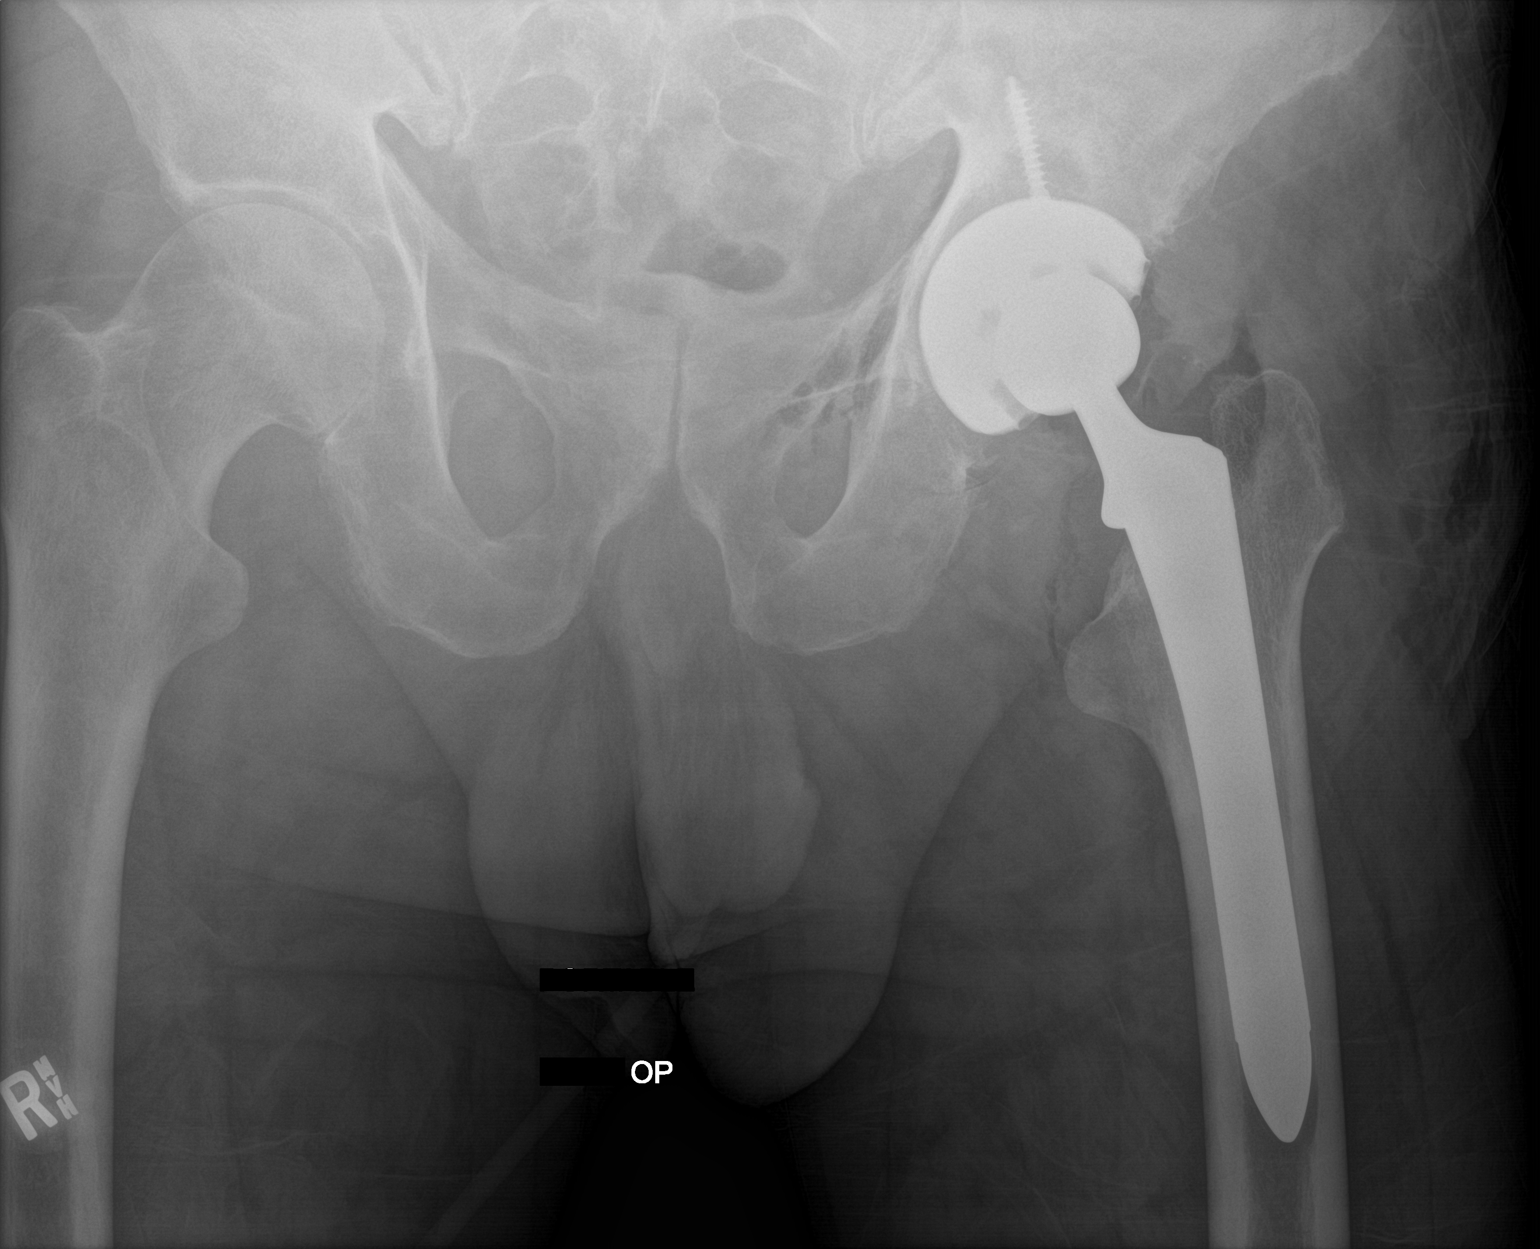

[hip frog leg]
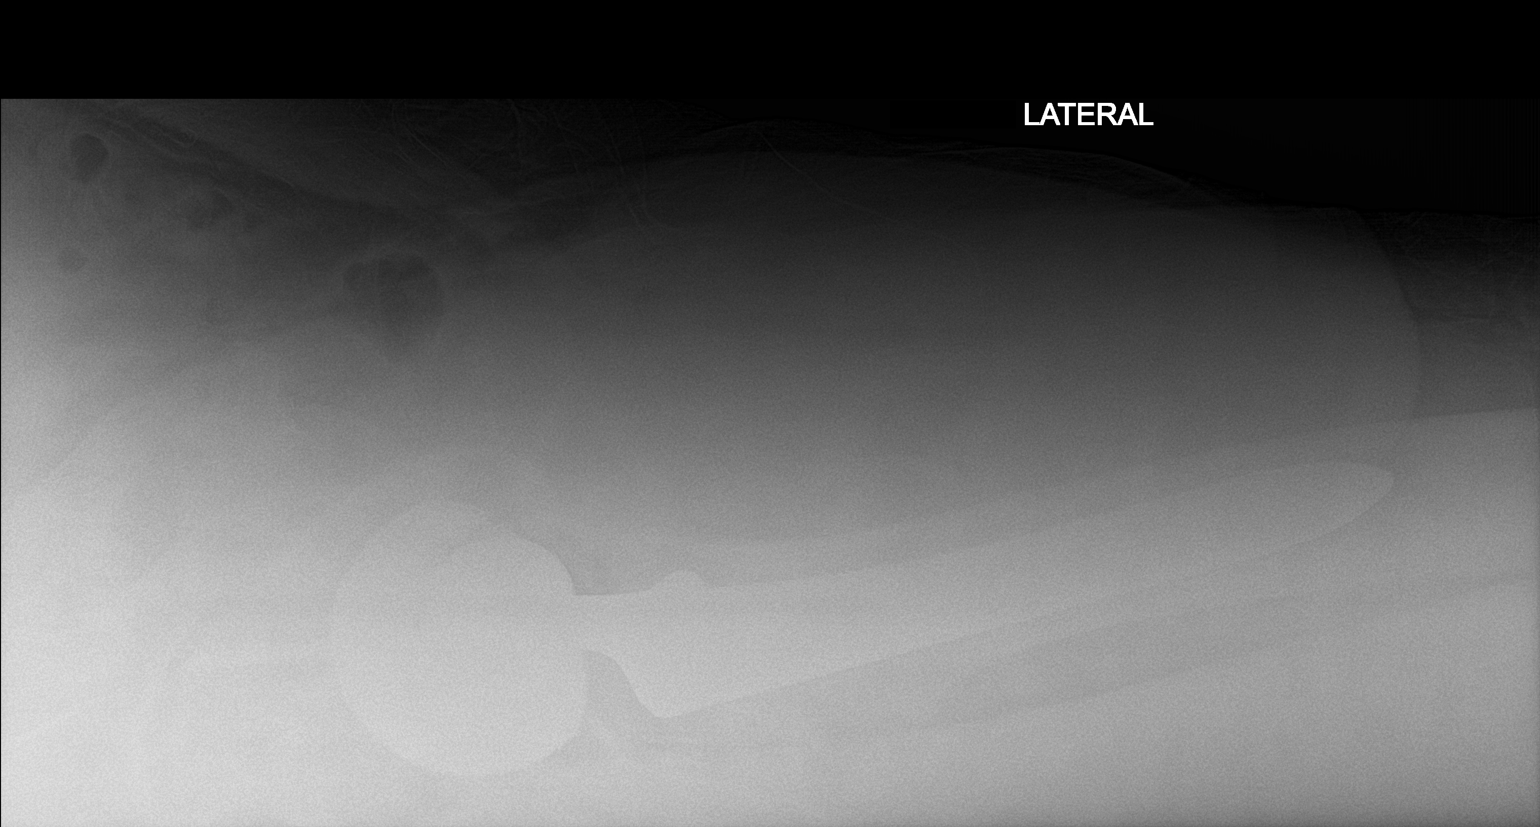

[2 of 2 positions shown; findings below may reference images not displayed]

FINDINGS: Revision of left hip prosthesis without complication noted.
Postoperative gas within surrounding soft tissue.

Right hip joint degenerative changes.
IMPRESSION: Revision of left hip prosthesis without complication noted.

## 2018-08-29 ENCOUNTER — Emergency Department (HOSPITAL_COMMUNITY)
Admission: EM | Admit: 2018-08-29 | Discharge: 2018-08-29 | Disposition: A | Discharge status: Home / Self Care | Payer: Medicare Other | Attending: Emergency Medicine | Admitting: Emergency Medicine

## 2018-08-29 ENCOUNTER — Other Ambulatory Visit: Payer: Self-pay

## 2018-08-29 ENCOUNTER — Emergency Department (HOSPITAL_COMMUNITY): Payer: Medicare Other

## 2018-08-29 DIAGNOSIS — Z96642 Presence of left artificial hip joint: Secondary | ICD-10-CM | POA: Diagnosis not present

## 2018-08-29 DIAGNOSIS — Z20828 Contact with and (suspected) exposure to other viral communicable diseases: Secondary | ICD-10-CM | POA: Diagnosis not present

## 2018-08-29 DIAGNOSIS — Y929 Unspecified place or not applicable: Secondary | ICD-10-CM | POA: Insufficient documentation

## 2018-08-29 DIAGNOSIS — Y9389 Activity, other specified: Secondary | ICD-10-CM | POA: Insufficient documentation

## 2018-08-29 DIAGNOSIS — Z9889 Other specified postprocedural states: Secondary | ICD-10-CM

## 2018-08-29 DIAGNOSIS — Z03818 Encounter for observation for suspected exposure to other biological agents ruled out: Secondary | ICD-10-CM | POA: Diagnosis not present

## 2018-08-29 DIAGNOSIS — R52 Pain, unspecified: Secondary | ICD-10-CM | POA: Diagnosis not present

## 2018-08-29 DIAGNOSIS — M25552 Pain in left hip: Secondary | ICD-10-CM | POA: Diagnosis not present

## 2018-08-29 DIAGNOSIS — Z471 Aftercare following joint replacement surgery: Secondary | ICD-10-CM | POA: Diagnosis not present

## 2018-08-29 DIAGNOSIS — S73015A Posterior dislocation of left hip, initial encounter: Secondary | ICD-10-CM | POA: Diagnosis not present

## 2018-08-29 DIAGNOSIS — Z96641 Presence of right artificial hip joint: Secondary | ICD-10-CM | POA: Diagnosis not present

## 2018-08-29 DIAGNOSIS — X501XXA Overexertion from prolonged static or awkward postures, initial encounter: Secondary | ICD-10-CM | POA: Diagnosis not present

## 2018-08-29 DIAGNOSIS — S73005A Unspecified dislocation of left hip, initial encounter: Secondary | ICD-10-CM | POA: Diagnosis not present

## 2018-08-29 DIAGNOSIS — I10 Essential (primary) hypertension: Secondary | ICD-10-CM | POA: Diagnosis not present

## 2018-08-29 DIAGNOSIS — Z79899 Other long term (current) drug therapy: Secondary | ICD-10-CM | POA: Insufficient documentation

## 2018-08-29 DIAGNOSIS — Y999 Unspecified external cause status: Secondary | ICD-10-CM | POA: Insufficient documentation

## 2018-08-29 LAB — CBC WITH DIFFERENTIAL/PLATELET
Abs Immature Granulocytes: 0.03 10*3/uL (ref 0.00–0.07)
Basophils Absolute: 0.1 10*3/uL (ref 0.0–0.1)
Basophils Relative: 1 %
Eosinophils Absolute: 0.1 10*3/uL (ref 0.0–0.5)
Eosinophils Relative: 2 %
HCT: 49.1 % (ref 39.0–52.0)
Hemoglobin: 15.5 g/dL (ref 13.0–17.0)
Immature Granulocytes: 1 %
Lymphocytes Relative: 15 %
Lymphs Abs: 0.9 10*3/uL (ref 0.7–4.0)
MCH: 28.9 pg (ref 26.0–34.0)
MCHC: 31.6 g/dL (ref 30.0–36.0)
MCV: 91.6 fL (ref 80.0–100.0)
Monocytes Absolute: 0.5 10*3/uL (ref 0.1–1.0)
Monocytes Relative: 8 %
Neutro Abs: 4.6 10*3/uL (ref 1.7–7.7)
Neutrophils Relative %: 73 %
Platelets: 173 10*3/uL (ref 150–400)
RBC: 5.36 MIL/uL (ref 4.22–5.81)
RDW: 13.2 % (ref 11.5–15.5)
WBC: 6.3 10*3/uL (ref 4.0–10.5)
nRBC: 0 % (ref 0.0–0.2)

## 2018-08-29 LAB — COMPREHENSIVE METABOLIC PANEL
ALT: 19 U/L (ref 0–44)
AST: 23 U/L (ref 15–41)
Albumin: 4.2 g/dL (ref 3.5–5.0)
Alkaline Phosphatase: 76 U/L (ref 38–126)
Anion gap: 10 (ref 5–15)
BUN: 27 mg/dL — ABNORMAL HIGH (ref 8–23)
CO2: 21 mmol/L — ABNORMAL LOW (ref 22–32)
Calcium: 9 mg/dL (ref 8.9–10.3)
Chloride: 107 mmol/L (ref 98–111)
Creatinine, Ser: 1.27 mg/dL — ABNORMAL HIGH (ref 0.61–1.24)
GFR calc Af Amer: >60 mL/min (ref >60)
GFR calc non Af Amer: 55 mL/min — ABNORMAL LOW (ref >60)
Glucose, Bld: 100 mg/dL — ABNORMAL HIGH (ref 70–99)
Potassium: 4.4 mmol/L (ref 3.5–5.1)
Sodium: 138 mmol/L (ref 135–145)
Total Bilirubin: 0.8 mg/dL (ref 0.3–1.2)
Total Protein: 7.2 g/dL (ref 6.5–8.1)

## 2018-08-29 LAB — SARS CORONAVIRUS 2 BY RT PCR (HOSPITAL ORDER, PERFORMED IN ~~LOC~~ HOSPITAL LAB): SARS Coronavirus 2: NEGATIVE

## 2018-08-29 MED ORDER — HYDROMORPHONE HCL 1 MG/ML IJ SOLN
1.0000 mg | Freq: Once | INTRAMUSCULAR | Status: AC
  Administered 2018-08-29: 1 mg via INTRAVENOUS
  Filled 2018-08-29: qty 1

## 2018-08-29 MED ORDER — PROPOFOL 10 MG/ML IV BOLUS
0.5000 mg/kg | Freq: Once | INTRAVENOUS | Status: AC
  Administered 2018-08-29: 69.9 mg via INTRAVENOUS
  Filled 2018-08-29: qty 20

## 2018-08-29 MED ORDER — MORPHINE SULFATE (PF) 4 MG/ML IV SOLN
4.0000 mg | Freq: Once | INTRAVENOUS | Status: AC
  Administered 2018-08-29: 4 mg via INTRAVENOUS
  Filled 2018-08-29: qty 1

## 2018-08-29 MED ORDER — PROPOFOL 10 MG/ML IV BOLUS
INTRAVENOUS | Status: AC | PRN
  Administered 2018-08-29: 20 mg via INTRAVENOUS
  Administered 2018-08-29 (×2): 40 mg via INTRAVENOUS
  Administered 2018-08-29: 20 mg via INTRAVENOUS

## 2018-08-29 NOTE — Progress Notes (Signed)
End Tidal 30-37 during conscious sedation.

## 2018-08-29 NOTE — ED Triage Notes (Signed)
Pt was getting up off of the toilet when his left hip dislocated. Pt has a hx of total hip replacement on the left side.

## 2018-08-29 NOTE — ED Provider Notes (Signed)
[facility] DEPT Provider Note   CSN: [id number] Arrival date & time: 08/29/18  1448     History   Chief Complaint Chief Complaint  Patient presents with   Hip Pain    HPI Jacob Mitchell is a 73 y.o. male.     Patient is a 73 year old male with history of total hip replacement on the left.  Today he was sitting on the toilet when he reached for an object and felt his hip pop out.  He was transported here by ambulance for evaluation of severe hip pain.  He denies any other injury.  He has no other complaints.  The history is provided by the patient.  Hip Pain    Past Medical History:  Diagnosis Date   Benign localized prostatic hyperplasia with lower urinary tract symptoms (LUTS)    Complication of anesthesia    pt says he gets "restless" when he is waking up and nurses have had to "hold him down"   Diverticulosis of colon    History of colon polyps    History of kidney stones    History of MRSA infection 2007   post right shoulder surgery   History of urinary retention 05/31/2017   due to BPH   Incomplete emptying of bladder    OA (osteoarthritis)    Peripheral neuropathy    feet-- hx frost bite   Pinched nerve in neck    right index and middle fingers numbness   Wears glasses    Wears hearing aid in both ears     Patient Active Problem List   Diagnosis Date Noted   Cough 05/14/2018   Insomnia 10/21/2017   Obese 10/15/2016   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/14/2016   History of nephrolithiasis 07/24/2016   Neuropathy 07/24/2016   S/P left THA, AA 11/23/2015    Past Surgical History:  Procedure Laterality Date   CYST REMOVAL LEG Right 1975   CYSTOSCOPY WITH INSERTION OF UROLIFT N/A 07/20/2017   Procedure: CYSTOSCOPY WITH INSERTION OF UROLIFT;  Surgeon: Cleon Gustin, MD;  Location: [facility];  Service: Urology;  Laterality: N/A;   DISTAL CLAVICLE EXCISION Left 1977   left shoulder     ELBOW SURGERY Right 1969;  [place]   L4-5   LUMBAR FUSION  2013  approx.   L1--L5   ROTATOR CUFF REPAIR Right x5    last one 2007   5 surgeries in 9 days--- original repair then 4 sx'  I&D sx's for MRSA infection   SHOULDER ARTHROSCOPY WITH DISTAL CLAVICLE RESECTION Left 1977   SHOULDER SURGERY Left 1976   TOTAL HIP ARTHROPLASTY Left 11/23/2015   Procedure: LEFT TOTAL HIP ARTHROPLASTY ANTERIOR APPROACH;  Surgeon: Paralee Cancel, MD;  Location: [facility];  Service: Orthopedics;  Laterality: Left;   TOTAL HIP REVISION     10/14/16 Dr. Alvan Dame   TOTAL HIP REVISION Left 10/14/2016   Procedure: TOTAL HIP REVISION POSTERIOR APPROACH;  Surgeon: Paralee Cancel, MD;  Location: [facility];  Service: Orthopedics;  Laterality: Left;        Home Medications    Prior to Admission medications   Medication Sig Start Date End Date Taking? Authorizing Provider  fluticasone (FLONASE) 50 MCG/ACT nasal spray Place 2 sprays into both nostrils daily. 05/14/18  Yes Luetta Nutting, DO  traZODone (DESYREL) 100 MG tablet Take 1 tablet (100 mg total) by mouth at bedtime. 07/22/18  Yes Luetta Nutting, DO  benzonatate (TESSALON) 100 MG capsule Take 1-2 capsules (100-200 mg total) by mouth 3 (three) times daily as needed for cough. Patient not taking: Reported on 08/29/2018 05/14/18   Luetta Nutting, DO    Family History Family History  Problem Relation Age of Onset   Alcohol abuse Father    Drug abuse Brother     Social History Social History   Tobacco Use   Smoking status: Never Smoker   Smokeless tobacco: Never Used  Substance Use Topics   Alcohol use: No   Drug use: No     Allergies   Aspartame and phenylalanine   Review of Systems Review of Systems  All other systems reviewed and are negative.    Physical Exam Updated Vital Signs BP 140/85 (BP Location: Right Arm)    Pulse 76    Temp 97.7 F (36.5 C) (Oral)    Resp 18    Ht 6\' 7"  (2.007 m)    Wt  (!) 139.7 kg    SpO2 98%    BMI 34.70 kg/m   Physical Exam Vitals signs and nursing note reviewed.  Constitutional:      General: He is not in acute distress.    Appearance: He is well-developed. He is not diaphoretic.  HENT:     Head: Normocephalic and atraumatic.  Neck:     Musculoskeletal: Normal range of motion and neck supple.  Cardiovascular:     Rate and Rhythm: Normal rate and regular rhythm.     Heart sounds: No murmur. No friction rub.  Pulmonary:     Effort: Pulmonary effort is normal. No respiratory distress.     Breath sounds: Normal breath sounds. No wheezing or rales.  Abdominal:     General: Bowel sounds are normal. There is no distension.     Palpations: Abdomen is soft.     Tenderness: There is no abdominal tenderness.  Musculoskeletal: Normal range of motion.     Comments: The left leg is shortened and internally rotated.  DP pulses are easily palpable and motor and sensation is intact the entire foot.  Skin:    General: Skin is warm and dry.  Neurological:     Mental Status: He is alert and oriented to person, place, and time.     Coordination: Coordination normal.      ED Treatments / Results  Labs (all labs ordered are listed, but only abnormal results are displayed) Labs Reviewed  COMPREHENSIVE METABOLIC PANEL - Abnormal; Notable for the following components:      Result Value   CO2 21 (*)    Glucose, Bld 100 (*)    BUN 27 (*)    Creatinine, Ser 1.27 (*)    GFR calc non Af Amer 55 (*)    All other components within normal limits  SARS CORONAVIRUS 2 (HOSPITAL ORDER, [place] [facility])  CBC WITH DIFFERENTIAL/PLATELET    EKG None  Radiology Dg Pelvis Portable  Result Date: 08/29/2018 CLINICAL DATA:  Post left hip reduction. EXAM: PORTABLE PELVIS 1-2 VIEWS COMPARISON:  Earlier same day and 10/14/2016 FINDINGS: There is been interval reduction of patient's dislocated femoral prosthesis. There is anatomic alignment of the  left hip joint as the femoral prosthesis is well seated within the acetabular component of the left hip arthroplasty. No evidence of fracture. Remainder of the exam is unchanged. IMPRESSION: Normal alignment of patient's left total hip arthroplasty post reduction. Electronically Signed   By: Quillian Quince  Derrel Nip M.D.   On: 08/29/2018 13:42   Dg Hip Unilat With Pelvis 2-3 Views Left  Result Date: 08/29/2018 CLINICAL DATA:  Left hip pain following a sensation of dislocation of the hip while trying to sit on the toilet. EXAM: DG HIP (WITH OR WITHOUT PELVIS) 2-3V LEFT COMPARISON:  10/14/2016. FINDINGS: Interval posterior, superior dislocation of the femoral component of the left hip prosthesis. No fracture seen. Multiple surgical clips in the expected position of the prostate gland. Lumbosacral spine fixation hardware. IMPRESSION: Posterior, superior dislocation of the femoral component of the left hip prosthesis. These results will be called to the ordering clinician or representative by the Radiologist Assistant, and communication documented in the PACS or zVision Dashboard. Electronically Signed   By: Claudie Revering M.D.   On: 08/29/2018 10:20    Procedures .Sedation  Date/Time: 08/29/2018 9:23 PM Performed by: Veryl Speak, MD Authorized by: Veryl Speak, MD   Consent:    Consent obtained:  Verbal   Consent given by:  Patient   Risks discussed:  Allergic reaction, dysrhythmia, inadequate sedation, nausea, prolonged hypoxia resulting in organ damage, prolonged sedation necessitating reversal, respiratory compromise necessitating ventilatory assistance and intubation and vomiting   Alternatives discussed:  Analgesia without sedation, anxiolysis and regional anesthesia Universal protocol:    Procedure explained and questions answered to patient or proxy's satisfaction: yes     Relevant documents present and verified: yes     Test results available and properly labeled: yes     Imaging studies available:  yes     Required blood products, implants, devices, and special equipment available: yes     Site/side marked: yes     Immediately prior to procedure a time out was called: yes     Patient identity confirmation method:  Verbally with patient Indications:    Procedure necessitating sedation performed by:  Physician performing sedation Pre-sedation assessment:    Time since last food or drink:  6 hours   ASA classification: class 1 - normal, healthy patient     Neck mobility: normal     Mouth opening:  3 or more finger widths   Thyromental distance:  4 finger widths   Mallampati score:  I - soft palate, uvula, fauces, pillars visible   Pre-sedation assessments completed and reviewed: airway patency, cardiovascular function, hydration status, mental status, nausea/vomiting, pain level, respiratory function and temperature   Immediate pre-procedure details:    Reassessment: Patient reassessed immediately prior to procedure     Reviewed: vital signs, relevant labs/tests and NPO status     Verified: bag valve mask available, emergency equipment available, intubation equipment available, IV patency confirmed, oxygen available and suction available   Procedure details (see MAR for exact dosages):    Preoxygenation:  Nasal cannula   Sedation:  Propofol   Intra-procedure monitoring:  Blood pressure monitoring, cardiac monitor, continuous pulse oximetry, frequent LOC assessments, frequent vital sign checks and continuous capnometry   Intra-procedure events: none     Intra-procedure management:  BVM ventilation   Total Provider sedation time (minutes):  15 Post-procedure details:    Attendance: Constant attendance by certified staff until patient recovered     Recovery: Patient returned to pre-procedure baseline     Post-sedation assessments completed and reviewed: airway patency, cardiovascular function, hydration status, mental status, nausea/vomiting, pain level, respiratory function and  temperature     Patient is stable for discharge or admission: yes     Patient tolerance:  Tolerated well, no immediate complications Comments:  Patient tolerated the procedure well with easy reduction of the left hip dislocation.  Patient did have minimal assistance with respirations using bag-valve-mask, but otherwise no complications. Reduction of dislocation  Date/Time: 08/29/2018 9:25 PM Performed by: Veryl Speak, MD Authorized by: Veryl Speak, MD  Consent: Verbal consent obtained. Written consent obtained. Consent given by: patient Patient understanding: patient states understanding of the procedure being performed Patient consent: the patient's understanding of the procedure matches consent given Procedure consent: procedure consent matches procedure scheduled Relevant documents: relevant documents present and verified Test results: test results available and properly labeled Site marked: the operative site was marked Imaging studies: imaging studies available Patient identity confirmed: verbally with patient, hospital-assigned identification number and arm band Time out: Immediately prior to procedure a "time out" was called to verify the correct patient, procedure, equipment, support staff and site/side marked as required. Local anesthesia used: no  Anesthesia: Local anesthesia used: no  Sedation: Patient sedated: yes Sedation type: moderate (conscious) sedation Sedatives: propofol Sedation start date/time: 08/29/2018 1:05 PM Sedation end date/time: 08/29/2018 1:20 PM  Patient tolerance: patient tolerated the procedure well with no immediate complications Comments: Hip was easily reduced and patient tolerated procedure well.  Reduction confirmed with postreduction films and the leg was neurovascularly intact pre-and post reduction.    (including critical care time)  Medications Ordered in ED Medications  morphine 4 MG/ML injection 4 mg (4 mg Intravenous Given  08/29/18 1041)  HYDROmorphone (DILAUDID) injection 1 mg (1 mg Intravenous Given 08/29/18 1230)  propofol (DIPRIVAN) 10 mg/mL bolus/IV push 69.9 mg (69.9 mg Intravenous Given 08/29/18 1300)  propofol (DIPRIVAN) 10 mg/mL bolus/IV push (20 mg Intravenous Given 08/29/18 1306)     Initial Impression / Assessment and Plan / ED Course  I have reviewed the triage vital signs and the nursing notes.  Pertinent labs & imaging results that were available during my care of the patient were reviewed by me and considered in my medical decision making (see chart for details).  Clinical Course as of Aug 28 2121  Sun Aug 29, 2018  1023 Posterior, superior dislocation of the femoral component of the left hip prosthesis.    DG Hip Unilat With Pelvis 2-3 Views Left [AH]  1108 CBC is within normal limits.  CBC with Differential [AH]  1129 Creatinine mildly elevated at 1.27. Bun elevated at 27.   Creatinine(!): 1.27 [AH]  1348 Normal alignment of patient's left total hip arthroplasty post reduction.    DG Pelvis Portable [AH]  9169 Reassessed the patient. Patient states pain has completely resolved and he is back at baseline.   [AH]  1430 Patient is able to ambulate without difficulty.    [AH]    Clinical Course User Index [AH] Arville Lime, PA-C    Patient's hip reduced successfully under conscious sedation using propofol.  For additional information, please see note authored by Darlin Drop, PA-C.  Final Clinical Impressions(s) / ED Diagnoses   Final diagnoses:  Left hip pain  Dislocation of left hip, initial encounter [facility])    ED Discharge Orders    None       Veryl Speak, MD 08/29/18 2128

## 2018-08-29 NOTE — ED Notes (Signed)
Ambulated pt in room- no pain, no discomfort, O2 SAT remained in normal range. Provider notified

## 2018-08-29 NOTE — ED Provider Notes (Signed)
[facility] DEPT Provider Note   CSN: [id number] Arrival date & time: 08/29/18  9357    History   Chief Complaint Chief Complaint  Patient presents with  . Hip Pain    HPI Jacob Mitchell is a 73 y.o. male with PMH of Left Total Hip arthroplasty and revision by Dr. Alvan Dame, BPH, Peripheral neuropathy, MRSA, and Kidney stones presenting due to left hip pain onset this morning. Patient reports he was sitting on the toilet when he twisted to grab lotion for his legs when he felt his left hip dislocate. Patient denies fall, head injury, or LOC. Patient reports constant sharp left sided muscle spasms. Patient reports chronic paresthesias in lower extremities. Patient reports left leg weakness due to pain. Patient denies nausea, vomiting, or abdominal pain. Patient is not able to ambulate. Patient denies fever, cough, congestion, sick exposures, or recent travel.      HPI  Past Medical History:  Diagnosis Date  . Benign localized prostatic hyperplasia with lower urinary tract symptoms (LUTS)   . Complication of anesthesia    pt says he gets "restless" when he is waking up and nurses have had to "hold him down"  . Diverticulosis of colon   . History of colon polyps   . History of kidney stones   . History of MRSA infection 2007   post right shoulder surgery  . History of urinary retention 05/31/2017   due to BPH  . Incomplete emptying of bladder   . OA (osteoarthritis)   . Peripheral neuropathy    feet-- hx frost bite  . Pinched nerve in neck    right index and middle fingers numbness  . Wears glasses   . Wears hearing aid in both ears     Patient Active Problem List   Diagnosis Date Noted  . Cough 05/14/2018  . Insomnia 10/21/2017  . Obese 10/15/2016  . 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 History of nephrolithiasis 07/24/2016  . Neuropathy 07/24/2016  . S/P left THA, AA 11/23/2015    Past Surgical History:  Procedure Laterality Date  .  CYST REMOVAL LEG Right 1975  . CYSTOSCOPY WITH INSERTION OF UROLIFT N/A 07/20/2017   Procedure: CYSTOSCOPY WITH INSERTION OF UROLIFT;  Surgeon: Cleon Gustin, MD;  Location: [facility];  Service: Urology;  Laterality: N/A;  . DISTAL CLAVICLE EXCISION Left 1977   left shoulder  . ELBOW SURGERY Right 1969;  1980  . HAND SURGERY  1968  . LAMINECTOMY  1970   L4-5  . LUMBAR FUSION  2013  approx.   L1--L5  . ROTATOR CUFF REPAIR Right x5    last one 2007   5 surgeries in 9 days--- original repair then 4 sx'  I&D sx's for MRSA infection  . SHOULDER ARTHROSCOPY WITH DISTAL CLAVICLE RESECTION Left 1977  . SHOULDER SURGERY Left 1976  . TOTAL HIP ARTHROPLASTY Left 11/23/2015   Procedure: LEFT TOTAL HIP ARTHROPLASTY ANTERIOR APPROACH;  Surgeon: Paralee Cancel, MD;  Location: [facility];  Service: Orthopedics;  Laterality: Left;  . TOTAL HIP REVISION     10/14/16 Dr. Alvan Dame  . TOTAL HIP REVISION Left 10/14/2016   Procedure: TOTAL HIP REVISION POSTERIOR APPROACH;  Surgeon: Paralee Cancel, MD;  Location: [facility];  Service: Orthopedics;  Laterality: Left;        Home Medications    Prior to Admission medications   Medication Sig Start Date End Date Taking? Authorizing Provider  fluticasone (FLONASE) 50 MCG/ACT  nasal spray Place 2 sprays into both nostrils daily. 05/14/18  Yes Luetta Nutting, DO  traZODone (DESYREL) 100 MG tablet Take 1 tablet (100 mg total) by mouth at bedtime. 07/22/18  Yes Luetta Nutting, DO  benzonatate (TESSALON) 100 MG capsule Take 1-2 capsules (100-200 mg total) by mouth 3 (three) times daily as needed for cough. Patient not taking: Reported on 08/29/2018 05/14/18   Luetta Nutting, DO    Family History Family History  Problem Relation Age of Onset  . Alcohol abuse Father   . Drug abuse Brother     Social History Social History   Tobacco Use  . Smoking status: Never Smoker  . Smokeless tobacco: Never Used  Substance Use Topics  . Alcohol use: No  . Drug use:  No     Allergies   Aspartame and phenylalanine   Review of Systems Review of Systems  Constitutional: Negative for chills, diaphoresis and fever.  Respiratory: Negative for cough and shortness of breath.   Cardiovascular: Negative for chest pain.  Gastrointestinal: Negative for abdominal pain, nausea and vomiting.  Endocrine: Negative for cold intolerance and heat intolerance.  Genitourinary: Negative for dysuria.  Musculoskeletal: Positive for arthralgias (Pt reports left hip pain.) and gait problem. Negative for back pain.  Skin: Negative for rash.  Allergic/Immunologic: Negative for immunocompromised state.  Neurological: Positive for weakness and numbness.       Pt reports chronic paresthesias and numbness in lower extremities bilaterally.  Hematological: Negative for adenopathy.    Physical Exam Updated Vital Signs BP 129/76   Pulse 63   Temp 97.9 F (36.6 C) (Oral)   Resp 16   Ht 6\' 7"  (2.007 m)   Wt (!) 139.7 kg   SpO2 98%   BMI 34.70 kg/m   Physical Exam Vitals signs and nursing note reviewed.  Constitutional:      General: He is not in acute distress.    Appearance: He is well-developed. He is not diaphoretic.  HENT:     Head: Normocephalic and atraumatic.  Neck:     Musculoskeletal: Normal range of motion.  Cardiovascular:     Rate and Rhythm: Normal rate and regular rhythm.     Heart sounds: Normal heart sounds. No murmur. No friction rub. No gallop.   Pulmonary:     Effort: Pulmonary effort is normal. No respiratory distress.     Breath sounds: Normal breath sounds. No wheezing or rales.  Abdominal:     Palpations: Abdomen is soft.     Tenderness: There is no abdominal tenderness.  Musculoskeletal:        General: Deformity present.     Right hip: Normal. He exhibits normal range of motion, normal strength and no tenderness.     Left hip: He exhibits decreased range of motion, decreased strength, tenderness and bony tenderness.     Right knee:  Normal. He exhibits normal range of motion, no swelling and no effusion.     Left knee: Normal. He exhibits normal range of motion, no swelling and no effusion.     Right ankle: Normal. He exhibits normal range of motion, no swelling and no ecchymosis. No tenderness.     Left ankle: Normal. He exhibits normal range of motion, no swelling and no ecchymosis. No tenderness.     Comments: Left lower extremity is internally rotated and shortened. Chronic decrease sensation due to neuropathy. 2+ DP pulses.   Skin:    General: Skin is warm.     Findings: No  erythema or rash.  Neurological:     Mental Status: He is alert.     ED Treatments / Results  Labs (all labs ordered are listed, but only abnormal results are displayed) Labs Reviewed  COMPREHENSIVE METABOLIC PANEL - Abnormal; Notable for the following components:      Result Value   CO2 21 (*)    Glucose, Bld 100 (*)    BUN 27 (*)    Creatinine, Ser 1.27 (*)    GFR calc non Af Amer 55 (*)    All other components within normal limits  SARS CORONAVIRUS 2 (HOSPITAL ORDER, [facility])  CBC WITH DIFFERENTIAL/PLATELET    EKG None  Radiology Dg Pelvis Portable  Result Date: 08/29/2018 CLINICAL DATA:  Post left hip reduction. EXAM: PORTABLE PELVIS 1-2 VIEWS COMPARISON:  Earlier same day and 10/14/2016 FINDINGS: There is been interval reduction of patient's dislocated femoral prosthesis. There is anatomic alignment of the left hip joint as the femoral prosthesis is well seated within the acetabular component of the left hip arthroplasty. No evidence of fracture. Remainder of the exam is unchanged. IMPRESSION: Normal alignment of patient's left total hip arthroplasty post reduction. Electronically Signed   By: Marin Olp M.D.   On: 08/29/2018 13:42   Dg Hip Unilat With Pelvis 2-3 Views Left  Result Date: 08/29/2018 CLINICAL DATA:  Left hip pain following a sensation of dislocation of the hip while trying to sit  on the toilet. EXAM: DG HIP (WITH OR WITHOUT PELVIS) 2-3V LEFT COMPARISON:  10/14/2016. FINDINGS: Interval posterior, superior dislocation of the femoral component of the left hip prosthesis. No fracture seen. Multiple surgical clips in the expected position of the prostate gland. Lumbosacral spine fixation hardware. IMPRESSION: Posterior, superior dislocation of the femoral component of the left hip prosthesis. These results will be called to the ordering clinician or representative by the Radiologist Assistant, and communication documented in the PACS or zVision Dashboard. Electronically Signed   By: Claudie Revering M.D.   On: 08/29/2018 10:20    Procedures Procedures (including critical care time)  Medications Ordered in ED Medications  morphine 4 MG/ML injection 4 mg (4 mg Intravenous Given 08/29/18 1041)  HYDROmorphone (DILAUDID) injection 1 mg (1 mg Intravenous Given 08/29/18 1230)  propofol (DIPRIVAN) 10 mg/mL bolus/IV push 69.9 mg (69.9 mg Intravenous Given 08/29/18 1300)  propofol (DIPRIVAN) 10 mg/mL bolus/IV push (20 mg Intravenous Given 08/29/18 1306)     Initial Impression / Assessment and Plan / ED Course  I have reviewed the triage vital signs and the nursing notes.  Pertinent labs & imaging results that were available during my care of the patient were reviewed by me and considered in my medical decision making (see chart for details).  Clinical Course as of Aug 28 1428  Sun Aug 29, 2018  1023 Posterior, superior dislocation of the femoral component of the left hip prosthesis.    DG Hip Unilat With Pelvis 2-3 Views Left [AH]  1108 CBC is within normal limits.  CBC with Differential [AH]  1129 Creatinine mildly elevated at 1.27. Bun elevated at 27.   Creatinine(!): 1.27 [AH]  1348 Normal alignment of patient's left total hip arthroplasty post reduction.    DG Pelvis Portable [AH]  7829 Reassessed the patient. Patient states pain has completely resolved and he is back at  baseline.   [AH]  1430 Patient is able to ambulate without difficulty.    [AH]    Clinical Course User Index [  AH] Arville Lime, [place]      Patient presents with left hip pain. X ray reveals posterior, superior dislocation of the femoral component of the left hip prosthesis. Consulted orthopedics. Orthopedics recommends attempting reduction in the ER and states they will evaluate patient if reduction is unsuccessful. Reduction was performed by Dr. Stark Jock. Post reduction x rays reveal normal alignment of patient's left total hip arthroplasty post reduction. Patient is stable in no acute distress. Patient is able to ambulate without difficulty. Discussed return precautions. Advised patient to use walker for next few days with ambulation. Advised patient to follow up with PCP and/or orthopedics if needed. Patient states he understands and agrees with plan.  Findings and plan of care discussed with supervising physician Dr. Stark Jock.  Final Clinical Impressions(s) / ED Diagnoses   Final diagnoses:  Left hip pain  Dislocation of left hip, initial encounter [facility])    ED Discharge Orders    None       Arville Lime, [place] 08/29/18 1430    Veryl Speak, MD 08/29/18 2122

## 2018-08-29 NOTE — Discharge Instructions (Signed)
You have been seen today for hip pain. Please read and follow all provided instructions.   1. Medications: usual home medications 2. Treatment: rest, drink plenty of fluids 3. Follow Up: Please follow up with your primary doctor in 7 days for discussion of your diagnoses and further evaluation after today's visit; if you do not have a primary care doctor use the resource guide provided to find one; Please return to the ER for any new or worsening symptoms. Please obtain all of your results from medical records or have your doctors office obtain the results - share them with your doctor - you should be seen at your doctors office. Call today to arrange your follow up.   Take medications as prescribed. Please review all of the medicines and only take them if you do not have an allergy to them. Return to the emergency room for worsening condition or new concerning symptoms. Follow up with your regular doctor. If you don't have a regular doctor use one of the numbers below to establish a primary care doctor.  Please be aware that if you are taking birth control pills, taking other prescriptions, ESPECIALLY ANTIBIOTICS may make the birth control ineffective - if this is the case, either do not engage in sexual activity or use alternative methods of birth control such as condoms until you have finished the medicine and your family doctor says it is OK to restart them. If you are on a blood thinner such as COUMADIN, be aware that any other medicine that you take may cause the coumadin to either work too much, or not enough - you should have your coumadin level rechecked in next 7 days if this is the case.  ?  It is also a possibility that you have an allergic reaction to any of the medicines that you have been prescribed - Everybody reacts differently to medications and while MOST people have no trouble with most medicines, you may have a reaction such as nausea, vomiting, rash, swelling, shortness of breath. If  this is the case, please stop taking the medicine immediately and contact your physician.  ?  You should return to the ER if you develop severe or worsening symptoms.   Emergency Department Resource Guide 1) Find a Doctor and Pay Out of Pocket Although you won't have to find out who is covered by your insurance plan, it is a good idea to ask around and get recommendations. You will then need to call the office and see if the doctor you have chosen will accept you as a new patient and what types of options they offer for patients who are self-pay. Some doctors offer discounts or will set up payment plans for their patients who do not have insurance, but you will need to ask so you aren't surprised when you get to your appointment.  2) Contact Your Local Health Department Not all health departments have doctors that can see patients for sick visits, but many do, so it is worth a call to see if yours does. If you don't know where your local health department is, you can check in your phone book. The CDC also has a tool to help you locate your state's health department, and many state websites also have listings of all of their local health departments.  3) Find a [facility] If your illness is not likely to be very severe or complicated, you may want to try a walk in clinic. These are popping up all over  the country in pharmacies, drugstores, and shopping centers. They're usually staffed by nurse practitioners or physician assistants that have been trained to treat common illnesses and complaints. They're usually fairly quick and inexpensive. However, if you have serious medical issues or chronic medical problems, these are probably not your best option. ° °No Primary Care Doctor: °Call [facility] at  832-8000 - they can help you locate a primary care doctor that  accepts your insurance, provides certain services, etc. °Physician Referral Service- 1-800-533-3463 ° °Chronic Pain  Problems: °Organization         Address  Phone   Notes  °[facility]  ([phone]) [phone] Patients need to be referred by their primary care doctor.  ° °Medication Assistance: °Organization         Address  Phone   Notes  °[place] [facility] [address] °[place], [place] [address] [phone]) [phone] --Must be a resident of [place] °-- Must have NO insurance coverage whatsoever (no Medicaid/ Medicare, etc.) °-- The pt. MUST have a primary care doctor that directs their care regularly and follows them in the community °  °[facility]  (866) 331-1348   °[employer]  (888) 892-1162   ° °Agencies that provide inexpensive medical care: °Organization         Address  Phone   Notes  °[facility]  ([phone]) [phone]   °[facility]    ([phone]) 832-7272   °[facility] [address] °[place], [place] [address] ([phone]) [phone]   °[facility] [address], °[place] ([phone]) [phone]   °[facility]    (336) 373-0678   °[facility]    (336) 272-1050   °[facility] ° [address], [place] Phone:  (336) [phone], Fax:  ([phone]) [phone] Hours of Operation:  9 am - 6 pm, M-F.  Also accepts Medicaid/Medicare and self-pay.  °[facility] ° [address], [place] Phone: ([phone]) [phone], Fax: ([phone]) [phone]. Hours of Operation:  8:30 am - 5:30 pm, M-F.  Also accepts Medicaid and self-pay.  °[facility] [place] [address], [place] Phone: ([phone]) [phone]   °[facility] [address], [place], [place] (336)[phone], Ext. 123 Mondays & Thursdays: 7-9 AM.  First 15 patients are seen on a first come, first serve basis. °  ° °Medicaid-accepting [place] Providers: ° °Organization         Address  Phone   Notes  °[facility] [address], [place] ([phone]) [phone] Also  accepts self-pay patients.  °[facility] [address], [place] ° ([phone]) [phone]   °[facility] [address], [place] ([phone]) [phone]   °[facility] [address] [place] [address], [place] ([phone]) [phone]   °[facility] [address], [place]  ° ([phone]) [phone] Only accepts [place] Access Medicaid patients after they have their name applied to their card.  ° °Self-Pay (no insurance) in [place]: ° °Organization         Address  Phone   Notes  °Sickle Cell Patients, [facility] [address], [place] ([phone]) [phone]   °[facility] [address], [place] ([phone]) [phone]   °[facility] ° [address], [place] ([phone]) [phone]   °[facility]/Dr. Osei-Bonsu ° [address] [place] [address], [place] or [address], [place] [phone]) [phone] Phone number for both [place] and [place] locations is the same.  °[facility] [address], [place] ([phone]) [phone]   °[facility] [address] [place] [address],  or [address] ([phone]) [phone] °([phone]) [phone]   °[facility] [address],  ([phone]) [phone], phone; ([phone]) [phone], fax Sees patients 1st and 3rd Saturday of every month.  Must not qualify for public or private insurance (i.e. Medicaid, Medicare, [place] [employer], Veterans' Benefits)  Household income should be no more than 200% of the poverty level The clinic cannot treat you if you are pregnant or think you are pregnant  Sexually transmitted diseases are not treated at the clinic.  °  ° °

## 2018-09-06 ENCOUNTER — Other Ambulatory Visit: Payer: Self-pay

## 2018-09-06 ENCOUNTER — Emergency Department (HOSPITAL_COMMUNITY): Payer: Medicare Other

## 2018-09-06 ENCOUNTER — Encounter (HOSPITAL_COMMUNITY): Payer: Self-pay

## 2018-09-06 ENCOUNTER — Emergency Department (HOSPITAL_COMMUNITY)
Admission: EM | Admit: 2018-09-06 | Discharge: 2018-09-06 | Disposition: A | Discharge status: Home / Self Care | Payer: Medicare Other | Attending: Emergency Medicine | Admitting: Emergency Medicine

## 2018-09-06 DIAGNOSIS — S73005A Unspecified dislocation of left hip, initial encounter: Secondary | ICD-10-CM | POA: Insufficient documentation

## 2018-09-06 DIAGNOSIS — Z471 Aftercare following joint replacement surgery: Secondary | ICD-10-CM | POA: Diagnosis not present

## 2018-09-06 DIAGNOSIS — S70912A Unspecified superficial injury of left hip, initial encounter: Secondary | ICD-10-CM | POA: Diagnosis present

## 2018-09-06 DIAGNOSIS — Y92003 Bedroom of unspecified non-institutional (private) residence as the place of occurrence of the external cause: Secondary | ICD-10-CM | POA: Insufficient documentation

## 2018-09-06 DIAGNOSIS — W19XXXA Unspecified fall, initial encounter: Secondary | ICD-10-CM

## 2018-09-06 DIAGNOSIS — Y999 Unspecified external cause status: Secondary | ICD-10-CM | POA: Insufficient documentation

## 2018-09-06 DIAGNOSIS — R001 Bradycardia, unspecified: Secondary | ICD-10-CM | POA: Diagnosis not present

## 2018-09-06 DIAGNOSIS — X501XXA Overexertion from prolonged static or awkward postures, initial encounter: Secondary | ICD-10-CM | POA: Insufficient documentation

## 2018-09-06 DIAGNOSIS — Y9389 Activity, other specified: Secondary | ICD-10-CM | POA: Diagnosis not present

## 2018-09-06 DIAGNOSIS — R000 Tachycardia, unspecified: Secondary | ICD-10-CM | POA: Diagnosis not present

## 2018-09-06 DIAGNOSIS — R52 Pain, unspecified: Secondary | ICD-10-CM | POA: Diagnosis not present

## 2018-09-06 DIAGNOSIS — R0902 Hypoxemia: Secondary | ICD-10-CM | POA: Diagnosis not present

## 2018-09-06 DIAGNOSIS — Z96642 Presence of left artificial hip joint: Secondary | ICD-10-CM | POA: Diagnosis not present

## 2018-09-06 DIAGNOSIS — T84021A Dislocation of internal left hip prosthesis, initial encounter: Secondary | ICD-10-CM | POA: Diagnosis not present

## 2018-09-06 MED ORDER — PROPOFOL 10 MG/ML IV BOLUS
INTRAVENOUS | Status: AC | PRN
  Administered 2018-09-06: 35 mg via INTRAVENOUS

## 2018-09-06 MED ORDER — SODIUM CHLORIDE 0.9 % IV BOLUS
1000.0000 mL | Freq: Once | INTRAVENOUS | Status: AC
  Administered 2018-09-06: 05:00:00 1000 mL via INTRAVENOUS

## 2018-09-06 MED ORDER — PROPOFOL 10 MG/ML IV BOLUS
INTRAVENOUS | Status: AC | PRN
  Administered 2018-09-06: 70 mg via INTRAVENOUS

## 2018-09-06 MED ORDER — PROPOFOL 10 MG/ML IV BOLUS
1.0000 mg/kg | Freq: Once | INTRAVENOUS | Status: DC
  Filled 2018-09-06: qty 20

## 2018-09-06 MED ORDER — HYDROMORPHONE HCL 1 MG/ML IJ SOLN
1.0000 mg | Freq: Once | INTRAMUSCULAR | Status: AC
  Administered 2018-09-06: 1 mg via INTRAVENOUS
  Filled 2018-09-06: qty 1

## 2018-09-06 NOTE — ED Triage Notes (Signed)
Pt arrived from home, states that Sunday he was here for a hip dislocation. Tonight he rolled over in his bed and when his leg fell off the side it popped out of place. Pt given 100 mcg fentanyl in route.

## 2018-09-06 NOTE — ED Notes (Signed)
XR at bedside

## 2018-09-06 NOTE — ED Notes (Signed)
Patient transported to X-ray 

## 2018-09-06 NOTE — ED Notes (Signed)
Pt verbalized discharge instructions and follow up care. Alert and ambulatory. No iv. Wife picking pt up

## 2018-09-06 NOTE — ED Provider Notes (Signed)
Emergency Department Provider Note   I have reviewed the triage vital signs and the nursing notes.   HISTORY  Chief Complaint Hip Pain   HPI Jacob Mitchell is a 73 y.o. male here with left hip pain and deformity similar to previous hip dislocation.  Patient was seen 8 or 9 days ago had a reduction done at that time.  Patient states that he was put his leg was at the bed and then twisted in an abnormal way and went to stand up in his hip had acute onset of pain at that time.  EMS was called and brought here for further evaluation.  Patient without other complaints.   No other associated or modifying symptoms.    Past Medical History:  Diagnosis Date  . Benign localized prostatic hyperplasia with lower urinary tract symptoms (LUTS)   . Complication of anesthesia    pt says he gets "restless" when he is waking up and nurses have had to "hold him down"  . Diverticulosis of colon   . History of colon polyps   . History of kidney stones   . History of MRSA infection 2007   post right shoulder surgery  . History of urinary retention 05/31/2017   due to BPH  . Incomplete emptying of bladder   . OA (osteoarthritis)   . Peripheral neuropathy    feet-- hx frost bite  . Pinched nerve in neck    right index and middle fingers numbness  . Wears glasses   . Wears hearing aid in both ears     Patient Active Problem List   Diagnosis Date Noted  . Cough 05/14/2018  . Insomnia 10/21/2017  . Obese 10/15/2016  . 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 History of nephrolithiasis 07/24/2016  . Neuropathy 07/24/2016  . S/P left THA, AA 11/23/2015    Past Surgical History:  Procedure Laterality Date  . CYST REMOVAL LEG Right 1975  . CYSTOSCOPY WITH INSERTION OF UROLIFT N/A 07/20/2017   Procedure: CYSTOSCOPY WITH INSERTION OF UROLIFT;  Surgeon: Cleon Gustin, MD;  Location: [facility];  Service: Urology;  Laterality: N/A;  . DISTAL CLAVICLE EXCISION Left 1977   left shoulder  . ELBOW SURGERY Right 1969;  1980  . HAND SURGERY  1968  . LAMINECTOMY  1970   L4-5  . LUMBAR FUSION  2013  approx.   L1--L5  . ROTATOR CUFF REPAIR Right x5    last one 2007   5 surgeries in 9 days--- original repair then 4 sx'  I&D sx's for MRSA infection  . SHOULDER ARTHROSCOPY WITH DISTAL CLAVICLE RESECTION Left 1977  . SHOULDER SURGERY Left 1976  . TOTAL HIP ARTHROPLASTY Left 11/23/2015   Procedure: LEFT TOTAL HIP ARTHROPLASTY ANTERIOR APPROACH;  Surgeon: Paralee Cancel, MD;  Location: [facility];  Service: Orthopedics;  Laterality: Left;  . TOTAL HIP REVISION     10/14/16 Dr. Alvan Dame  . TOTAL HIP REVISION Left 10/14/2016   Procedure: TOTAL HIP REVISION POSTERIOR APPROACH;  Surgeon: Paralee Cancel, MD;  Location: [facility];  Service: Orthopedics;  Laterality: Left;    Current Outpatient Rx  . Order #: [id number] Class: Normal    Allergies Aspartame and phenylalanine  Family History  Problem Relation Age of Onset  . Alcohol abuse Father   . Drug abuse Brother     Social History Social History   Tobacco Use  . Smoking status: Never Smoker  . Smokeless tobacco: Never Used  Substance Use  Topics  . Alcohol use: No  . Drug use: No    Review of Systems  All other systems negative except as documented in the HPI. All pertinent positives and negatives as reviewed in the HPI. ____________________________________________   PHYSICAL EXAM:  VITAL SIGNS: ED Triage Vitals  Enc Vitals Group     BP 09/06/18 0312 (!) 169/63     Pulse Rate 09/06/18 0312 62     Resp 09/06/18 0312 13     Temp 09/06/18 0312 98.2 F (36.8 C)     Temp Source 09/06/18 0312 Oral     SpO2 09/06/18 0311 95 %     Weight 09/06/18 0312 (!) 303 lb (137.4 kg)     Height 09/06/18 0312 6\' 7"  (2.007 m)     Head Circumference --      Peak Flow --      Pain Score 09/06/18 0313 6     Pain Loc --      Pain Edu? --      Excl. in [place]? --     Constitutional: Alert and oriented. Well appearing and in no  acute distress. Eyes: Conjunctivae are normal. PERRL. EOMI. Head: Atraumatic. Nose: No congestion/rhinnorhea. Mouth/Throat: Mucous membranes are moist.  Oropharynx non-erythematous. Neck: No stridor.  No meningeal signs.   Cardiovascular: Normal rate, regular rhythm. Good peripheral circulation. Grossly normal heart sounds.   Respiratory: Normal respiratory effort.  No retractions. Lungs CTAB. Gastrointestinal: Soft and nontender. No distention.  Musculoskeletal: pain with ROM of left hip and with palpation of same.  Neurologic:  Normal speech and language. No gross focal neurologic deficits are appreciated.  Skin:  Skin is warm, dry and intact. No rash noted.  ____________________________________________   [id number]  Dg Pelvis Portable  Result Date: 09/06/2018 CLINICAL DATA:  Post reduction of left hip EXAM: PORTABLE PELVIS 1-2 VIEWS COMPARISON:  08/29/2018 FINDINGS: Reduced left hip arthroplasty in the frontal projection. Bone fragment lateral to the acetabulum is chronic and corticated when compared with prior. No acute fracture. IMPRESSION: Successful reduction of left hip arthroplasty. Electronically Signed   By: Monte Fantasia M.D.   On: 09/06/2018 05:27   Dg Hip Unilat With Pelvis 2-3 Views Left  Result Date: 09/06/2018 CLINICAL DATA:  Hip dislocation EXAM: DG HIP (WITH OR WITHOUT PELVIS) 2-3V LEFT COMPARISON:  08/29/2018 FINDINGS: Total left hip arthroplasty with dislocation posteriorly and superiorly. Negative for fracture. IMPRESSION: Posterior and superior left hip arthroplasty dislocation. Electronically Signed   By: Monte Fantasia M.D.   On: 09/06/2018 04:33    ____________________________________________   PROCEDURES  Procedure(s) performed:   .Sedation  Date/Time: 09/06/2018 6:01 AM Performed by: Merrily Pew, MD Authorized by: Merrily Pew, MD   Consent:    Consent obtained:  Verbal   Consent given by:  Patient   Risks discussed:  Allergic reaction,  dysrhythmia, inadequate sedation, nausea, prolonged hypoxia resulting in organ damage, prolonged sedation necessitating reversal, respiratory compromise necessitating ventilatory assistance and intubation and vomiting   Alternatives discussed:  Analgesia without sedation, anxiolysis and regional anesthesia Universal protocol:    Procedure explained and questions answered to patient or proxy's satisfaction: yes     Relevant documents present and verified: yes     Test results available and properly labeled: yes     Imaging studies available: yes     Required blood products, implants, devices, and special equipment available: yes     Site/side marked: yes     Immediately prior to procedure a time out was  called: yes     Patient identity confirmation method:  Verbally with patient Indications:    Procedure performed:  Dislocation reduction   Procedure necessitating sedation performed by:  Physician performing sedation Pre-sedation assessment:    Time since last food or drink:  3 hours   NPO status caution: urgency dictates proceeding with non-ideal NPO status     ASA classification: class 1 - normal, healthy patient     Neck mobility: normal     Mouth opening:  3 or more finger widths   Thyromental distance:  4 finger widths   Mallampati score:  I - soft palate, uvula, fauces, pillars visible   Pre-sedation assessments completed and reviewed: airway patency, cardiovascular function, hydration status, mental status, nausea/vomiting, pain level, respiratory function and temperature   Immediate pre-procedure details:    Reassessment: Patient reassessed immediately prior to procedure     Reviewed: vital signs, relevant labs/tests and NPO status     Verified: bag valve mask available, emergency equipment available, intubation equipment available, IV patency confirmed, oxygen available and suction available   Procedure details (see MAR for exact dosages):    Preoxygenation:  Nasal cannula    Sedation:  Propofol   Intra-procedure monitoring:  Blood pressure monitoring, cardiac monitor, continuous pulse oximetry, frequent LOC assessments, frequent vital sign checks and continuous capnometry   Intra-procedure events: none     Total Provider sedation time (minutes):  16 Post-procedure details:    Attendance: Constant attendance by certified staff until patient recovered     Recovery: Patient returned to pre-procedure baseline     Post-sedation assessments completed and reviewed: airway patency, cardiovascular function, hydration status, mental status, nausea/vomiting, pain level, respiratory function and temperature     Patient is stable for discharge or admission: yes     Patient tolerance:  Tolerated well, no immediate complications .Ortho Injury Treatment  Date/Time: 09/06/2018 6:02 AM Performed by: Merrily Pew, MD Authorized by: Merrily Pew, MD   Consent:    Consent obtained:  Verbal and written   Consent given by:  Patient   Risks discussed:  Irreducible dislocation, fracture, nerve damage, restricted joint movement, vascular damage, stiffness and recurrent dislocation   Alternatives discussed:  Immobilization, referral, no treatment, delayed treatment and alternative treatmentInjury location: hip Location details: left hip Injury type: dislocation Dislocation type: posterior Spontaneous dislocation: yes Prosthesis: yes Pre-procedure neurovascular assessment: neurovascularly intact Pre-procedure distal perfusion: normal Pre-procedure neurological function: normal Pre-procedure range of motion: reduced  Anesthesia: Local anesthesia used: no  Patient sedated: Yes. Refer to sedation procedure documentation for details of sedation. Manipulation performed: yes Reduction method: Allis maneuver Reduction successful: yes X-ray confirmed reduction: yes Immobilization: knee immobilizer. Post-procedure neurovascular assessment: post-procedure neurovascularly intact  Post-procedure distal perfusion: normal Post-procedure neurological function: normal Post-procedure range of motion: normal      ____________________________________________   INITIAL IMPRESSION / ASSESSMENT AND PLAN / ED COURSE  Reduced easily with allis maneuver. NVI afterwards. Improvement in symptoms. Knee immobilizer placed. FU w/ Dr. Alvan Dame.      Pertinent labs & imaging results that were available during my care of the patient were reviewed by me and considered in my medical decision making (see chart for details).  A medical screening exam was performed and I feel the patient has had an appropriate workup for their chief complaint at this time and likelihood of emergent condition existing is low. They have been counseled on decision, discharge, follow up and which symptoms necessitate immediate return to the emergency department. They or  their family verbally stated understanding and agreement with plan and discharged in stable condition.   ____________________________________________  FINAL CLINICAL IMPRESSION(S) / ED DIAGNOSES  Final diagnoses:  Dislocation of left hip, initial encounter [facility])     MEDICATIONS GIVEN DURING THIS VISIT:  Medications  propofol (DIPRIVAN) 10 mg/mL bolus/IV push 137.4 mg (70 mg Intravenous Not Given 09/06/18 0450)  HYDROmorphone (DILAUDID) injection 1 mg (1 mg Intravenous Given 09/06/18 0346)  sodium chloride 0.9 % bolus 1,000 mL (0 mLs Intravenous Stopped 09/06/18 0526)  propofol (DIPRIVAN) 10 mg/mL bolus/IV push (35 mg Intravenous Given 09/06/18 0454)  propofol (DIPRIVAN) 10 mg/mL bolus/IV push (70 mg Intravenous Given 09/06/18 0451)     NEW OUTPATIENT MEDICATIONS STARTED DURING THIS VISIT:  Current Discharge Medication List      Note:  This note was prepared with assistance of Dragon voice recognition software. Occasional wrong-word or sound-a-like substitutions may have occurred due to the inherent limitations of voice recognition  software.   Larkyn Greenberger, Corene Cornea, MD 09/06/18 [phone]

## 2018-09-08 DIAGNOSIS — Z96642 Presence of left artificial hip joint: Secondary | ICD-10-CM | POA: Diagnosis not present

## 2018-09-08 DIAGNOSIS — Z471 Aftercare following joint replacement surgery: Secondary | ICD-10-CM | POA: Diagnosis not present

## 2018-10-04 ENCOUNTER — Telehealth: Payer: Self-pay | Admitting: Family Medicine

## 2018-10-04 NOTE — Telephone Encounter (Signed)
I called and left message on patient voicemail to call office and schedule a follow up appointment with Dr. Zigmund Daniel, last office visit was in 05/2018.

## 2018-11-15 DIAGNOSIS — H2513 Age-related nuclear cataract, bilateral: Secondary | ICD-10-CM | POA: Diagnosis not present

## 2019-02-23 DIAGNOSIS — Z23 Encounter for immunization: Secondary | ICD-10-CM | POA: Diagnosis not present

## 2019-04-07 ENCOUNTER — Other Ambulatory Visit: Payer: Self-pay | Admitting: Family Medicine

## 2019-04-08 NOTE — Telephone Encounter (Signed)
Called patient to see if he would be continuing his care with one of the providers here at [facility] or if if he would follow Dr. Zigmund Daniel. Last visit April 2020 need refill om medication. LMTCB

## 2019-04-14 ENCOUNTER — Other Ambulatory Visit: Payer: Self-pay | Admitting: Family Medicine

## 2019-04-14 NOTE — Telephone Encounter (Signed)
Must sched with new provider first/thx dmf

## 2019-04-29 DIAGNOSIS — W228XXA Striking against or struck by other objects, initial encounter: Secondary | ICD-10-CM | POA: Diagnosis not present

## 2019-04-29 DIAGNOSIS — Z743 Need for continuous supervision: Secondary | ICD-10-CM | POA: Diagnosis not present

## 2019-04-29 DIAGNOSIS — G8911 Acute pain due to trauma: Secondary | ICD-10-CM | POA: Diagnosis not present

## 2019-04-29 DIAGNOSIS — Z96611 Presence of right artificial shoulder joint: Secondary | ICD-10-CM | POA: Diagnosis not present

## 2019-04-29 DIAGNOSIS — M25552 Pain in left hip: Secondary | ICD-10-CM | POA: Diagnosis not present

## 2019-04-29 DIAGNOSIS — Y999 Unspecified external cause status: Secondary | ICD-10-CM | POA: Diagnosis not present

## 2019-04-29 DIAGNOSIS — T84021A Dislocation of internal left hip prosthesis, initial encounter: Secondary | ICD-10-CM | POA: Diagnosis not present

## 2019-04-29 DIAGNOSIS — Y939 Activity, unspecified: Secondary | ICD-10-CM | POA: Diagnosis not present

## 2019-04-29 DIAGNOSIS — Y929 Unspecified place or not applicable: Secondary | ICD-10-CM | POA: Diagnosis not present

## 2019-04-29 DIAGNOSIS — R030 Elevated blood-pressure reading, without diagnosis of hypertension: Secondary | ICD-10-CM | POA: Diagnosis not present

## 2019-04-29 DIAGNOSIS — Z20822 Contact with and (suspected) exposure to covid-19: Secondary | ICD-10-CM | POA: Diagnosis not present

## 2019-04-29 DIAGNOSIS — S73005A Unspecified dislocation of left hip, initial encounter: Secondary | ICD-10-CM | POA: Diagnosis not present

## 2019-04-29 DIAGNOSIS — Z96642 Presence of left artificial hip joint: Secondary | ICD-10-CM | POA: Diagnosis not present

## 2019-04-29 DIAGNOSIS — M1712 Unilateral primary osteoarthritis, left knee: Secondary | ICD-10-CM | POA: Diagnosis not present

## 2019-04-29 DIAGNOSIS — W19XXXA Unspecified fall, initial encounter: Secondary | ICD-10-CM | POA: Diagnosis not present

## 2019-04-30 DIAGNOSIS — I4510 Unspecified right bundle-branch block: Secondary | ICD-10-CM | POA: Diagnosis not present

## 2019-04-30 DIAGNOSIS — I10 Essential (primary) hypertension: Secondary | ICD-10-CM | POA: Diagnosis not present

## 2019-04-30 DIAGNOSIS — M1612 Unilateral primary osteoarthritis, left hip: Secondary | ICD-10-CM | POA: Diagnosis not present

## 2019-04-30 DIAGNOSIS — R001 Bradycardia, unspecified: Secondary | ICD-10-CM | POA: Diagnosis not present

## 2019-04-30 DIAGNOSIS — S73005A Unspecified dislocation of left hip, initial encounter: Secondary | ICD-10-CM | POA: Diagnosis not present

## 2019-04-30 DIAGNOSIS — M5136 Other intervertebral disc degeneration, lumbar region: Secondary | ICD-10-CM | POA: Diagnosis not present

## 2019-04-30 DIAGNOSIS — M24452 Recurrent dislocation, left hip: Secondary | ICD-10-CM | POA: Diagnosis not present

## 2019-04-30 DIAGNOSIS — T84021A Dislocation of internal left hip prosthesis, initial encounter: Secondary | ICD-10-CM | POA: Diagnosis not present

## 2019-04-30 DIAGNOSIS — S72002A Fracture of unspecified part of neck of left femur, initial encounter for closed fracture: Secondary | ICD-10-CM | POA: Diagnosis not present

## 2019-04-30 DIAGNOSIS — Z743 Need for continuous supervision: Secondary | ICD-10-CM | POA: Diagnosis not present

## 2019-04-30 DIAGNOSIS — Z96642 Presence of left artificial hip joint: Secondary | ICD-10-CM | POA: Diagnosis not present

## 2019-04-30 DIAGNOSIS — R279 Unspecified lack of coordination: Secondary | ICD-10-CM | POA: Diagnosis not present

## 2019-04-30 DIAGNOSIS — I498 Other specified cardiac arrhythmias: Secondary | ICD-10-CM | POA: Diagnosis not present

## 2019-04-30 DIAGNOSIS — Z471 Aftercare following joint replacement surgery: Secondary | ICD-10-CM | POA: Diagnosis not present

## 2019-04-30 DIAGNOSIS — Z981 Arthrodesis status: Secondary | ICD-10-CM | POA: Diagnosis not present

## 2019-04-30 DIAGNOSIS — G8918 Other acute postprocedural pain: Secondary | ICD-10-CM | POA: Diagnosis not present

## 2019-04-30 DIAGNOSIS — T84031A Mechanical loosening of internal left hip prosthetic joint, initial encounter: Secondary | ICD-10-CM | POA: Diagnosis not present

## 2019-05-10 DIAGNOSIS — M9702XD Periprosthetic fracture around internal prosthetic left hip joint, subsequent encounter: Secondary | ICD-10-CM | POA: Diagnosis not present

## 2019-05-10 DIAGNOSIS — Z96642 Presence of left artificial hip joint: Secondary | ICD-10-CM | POA: Diagnosis not present

## 2019-05-25 DIAGNOSIS — Z96649 Presence of unspecified artificial hip joint: Secondary | ICD-10-CM | POA: Diagnosis not present

## 2019-05-25 DIAGNOSIS — Z471 Aftercare following joint replacement surgery: Secondary | ICD-10-CM | POA: Diagnosis not present

## 2019-05-25 DIAGNOSIS — Z96642 Presence of left artificial hip joint: Secondary | ICD-10-CM | POA: Diagnosis not present

## 2019-06-22 DIAGNOSIS — Z471 Aftercare following joint replacement surgery: Secondary | ICD-10-CM | POA: Diagnosis not present

## 2019-06-22 DIAGNOSIS — Z96642 Presence of left artificial hip joint: Secondary | ICD-10-CM | POA: Diagnosis not present

## 2019-07-21 ENCOUNTER — Other Ambulatory Visit: Payer: Self-pay | Admitting: Family Medicine

## 2019-08-11 ENCOUNTER — Other Ambulatory Visit: Payer: Self-pay | Admitting: Nurse Practitioner

## 2019-08-11 ENCOUNTER — Telehealth: Payer: Self-pay | Admitting: Nurse Practitioner

## 2019-08-11 DIAGNOSIS — G4700 Insomnia, unspecified: Secondary | ICD-10-CM

## 2019-08-11 MED ORDER — TRAZODONE HCL 100 MG PO TABS: ORAL_TABLET | ORAL | 0 refills | Status: DC

## 2019-08-11 NOTE — Telephone Encounter (Signed)
Pt notified of rx being sent per DPR to his wife who also has rx sent, pt verbally understood to maintain upcoming appointment 10/03/19 for additional refills.

## 2019-08-11 NOTE — Telephone Encounter (Signed)
Patient has TOC appt scheduled for August 23rd. He needs refills of Trazodone. Please send to same Jacob Mitchell at [facility].

## 2019-08-11 NOTE — Telephone Encounter (Signed)
60tabs sent. He needs to maintain upcoming appt in order to get additional refills

## 2019-08-11 NOTE — Telephone Encounter (Signed)
[place] please advise.  Pt asking for refills for:  Trazodone 30 tabs   0-refills Last filled-07/22/19 Last OV-05/24/18  Pt has a TOC from [place] to [place] on 10/03/19.

## 2019-09-01 DIAGNOSIS — Z23 Encounter for immunization: Secondary | ICD-10-CM | POA: Diagnosis not present

## 2019-09-21 DIAGNOSIS — H35372 Puckering of macula, left eye: Secondary | ICD-10-CM | POA: Diagnosis not present

## 2019-09-21 DIAGNOSIS — H25813 Combined forms of age-related cataract, bilateral: Secondary | ICD-10-CM | POA: Diagnosis not present

## 2019-10-03 ENCOUNTER — Other Ambulatory Visit: Payer: Self-pay

## 2019-10-03 ENCOUNTER — Ambulatory Visit (INDEPENDENT_AMBULATORY_CARE_PROVIDER_SITE_OTHER): Payer: Medicare Other | Admitting: Nurse Practitioner

## 2019-10-03 ENCOUNTER — Encounter: Payer: Self-pay | Admitting: Nurse Practitioner

## 2019-10-03 VITALS — BP 126/84 | HR 60 | Temp 97.0°F | Ht 78.0 in | Wt 307.0 lb

## 2019-10-03 DIAGNOSIS — Z13220 Encounter for screening for lipoid disorders: Secondary | ICD-10-CM

## 2019-10-03 DIAGNOSIS — Z23 Encounter for immunization: Secondary | ICD-10-CM | POA: Diagnosis not present

## 2019-10-03 DIAGNOSIS — G4700 Insomnia, unspecified: Secondary | ICD-10-CM | POA: Diagnosis not present

## 2019-10-03 DIAGNOSIS — Z136 Encounter for screening for cardiovascular disorders: Secondary | ICD-10-CM

## 2019-10-03 DIAGNOSIS — R739 Hyperglycemia, unspecified: Secondary | ICD-10-CM

## 2019-10-03 DIAGNOSIS — Z125 Encounter for screening for malignant neoplasm of prostate: Secondary | ICD-10-CM

## 2019-10-03 LAB — BASIC METABOLIC PANEL
BUN: 20 mg/dL (ref 6–23)
CO2: 24 mEq/L (ref 19–32)
Calcium: 9.4 mg/dL (ref 8.4–10.5)
Chloride: 104 mEq/L (ref 96–112)
Creatinine, Ser: 1.21 mg/dL (ref 0.40–1.50)
GFR: 58.28 mL/min — ABNORMAL LOW (ref >60.00)
Glucose, Bld: 93 mg/dL (ref 70–99)
Potassium: 4.6 mEq/L (ref 3.5–5.1)
Sodium: 137 mEq/L (ref 135–145)

## 2019-10-03 LAB — LIPID PANEL
Cholesterol: 146 mg/dL (ref 0–200)
HDL: 36.6 mg/dL — ABNORMAL LOW (ref >39.00)
LDL Cholesterol: 89 mg/dL (ref 0–99)
NonHDL: 109.19
Total CHOL/HDL Ratio: 4
Triglycerides: 102 mg/dL (ref 0.0–149.0)
VLDL: 20.4 mg/dL (ref 0.0–40.0)

## 2019-10-03 LAB — HEMOGLOBIN A1C: Hgb A1c MFr Bld: 5.6 % (ref 4.6–6.5)

## 2019-10-03 LAB — PSA, MEDICARE: PSA: 0.79 ng/ml (ref 0.10–4.00)

## 2019-10-03 MED ORDER — TRAZODONE HCL 100 MG PO TABS: ORAL_TABLET | ORAL | 0 refills | Status: DC

## 2019-10-03 NOTE — Patient Instructions (Signed)
Please bring copy of immunization records from [facility].  Go to lab for blood draw

## 2019-10-03 NOTE — Progress Notes (Signed)
Subjective:  Patient ID: Jacob Mitchell, male    DOB: 06-Apr-1945  Age: 74 y.o. MRN: [id number]  CC: Establish Care (TOC/Med refills)  HPI Accompanied by wife: Jacob Mitchell  Insomnia Improved Use of trazodone sparingly. Denies any anxiety or depression of daytime somnolence when taken. Denies any snoring or AM headache or fatigue or LE edema. Maintain trazodone use prn  Reviewed past Medical, Social and Family history today.  Outpatient Medications Prior to Visit  Medication Sig Dispense Refill  . traZODone (DESYREL) 100 MG tablet Take one at night as needed for sleep. Maintain upcoming appt for additional refills 60 tablet 0   No facility-administered medications prior to visit.   ROS See HPI  Objective:  BP 126/84 (BP Location: Left Arm, Patient Position: Sitting, Cuff Size: Normal)   Pulse 60   Temp (!) 97 F (36.1 C) (Temporal)   Ht 6\' 6"  (1.981 m)   Wt (!) 307 lb (139.3 kg)   SpO2 98%   BMI 35.48 kg/m   Physical Exam Vitals reviewed.  Constitutional:      Appearance: He is obese.  Cardiovascular:     Rate and Rhythm: Normal rate and regular rhythm.     Pulses: Normal pulses.     Heart sounds: Normal heart sounds.  Pulmonary:     Breath sounds: Normal breath sounds.  Abdominal:     General: Bowel sounds are normal.     Palpations: Abdomen is soft.  Musculoskeletal:     Right lower leg: No edema.     Left lower leg: No edema.  Neurological:     Mental Status: He is alert and oriented to person, place, and time.  Psychiatric:        Mood and Affect: Mood normal.        Behavior: Behavior normal.        Thought Content: Thought content normal.     Assessment & Plan:  This visit occurred during the SARS-CoV-2 public health emergency.  Safety protocols were in place, including screening questions prior to the visit, additional usage of staff PPE, and extensive cleaning of exam room while observing appropriate contact time as indicated for disinfecting  solutions.   Jacob Mitchell was seen today for establish care.  Diagnoses and all orders for this visit:  Encounter for lipid screening for cardiovascular disease -     Lipid panel  Hyperglycemia -     Hemoglobin A1c -     Basic metabolic panel  Insomnia, unspecified type -     traZODone (DESYREL) 100 MG tablet; Take one at night as needed for sleep.  Prostate cancer screening -     PSA, Medicare   Problem List Items Addressed This Visit      Other   Insomnia    Improved Use of trazodone sparingly. Denies any anxiety or depression of daytime somnolence when taken. Denies any snoring or AM headache or fatigue or LE edema. Maintain trazodone use prn      Relevant Medications   traZODone (DESYREL) 100 MG tablet    Other Visit Diagnoses    Encounter for lipid screening for cardiovascular disease    -  Primary   Relevant Orders   Lipid panel (Completed)   Hyperglycemia       Relevant Orders   Hemoglobin A1c (Completed)   Basic metabolic panel (Completed)   Prostate cancer screening       Relevant Orders   PSA, Medicare (Completed)      Follow-up:  Return in about 2 weeks (around 10/17/2019) for AWV with wellness coach.  Wilfred Lacy, NP

## 2019-10-08 ENCOUNTER — Encounter: Payer: Self-pay | Admitting: Nurse Practitioner

## 2019-10-08 NOTE — Assessment & Plan Note (Signed)
Improved Use of trazodone sparingly. Denies any anxiety or depression of daytime somnolence when taken. Denies any snoring or AM headache or fatigue or LE edema. Maintain trazodone use prn

## 2019-11-21 ENCOUNTER — Telehealth: Payer: Self-pay | Admitting: Nurse Practitioner

## 2019-11-21 NOTE — Telephone Encounter (Signed)
Left message for patient to schedule Annual Wellness Visit.  Please schedule with Nurse Health Advisor Martha Stanley, RN at [facility]  °

## 2019-12-19 ENCOUNTER — Other Ambulatory Visit: Payer: Self-pay | Admitting: Nurse Practitioner

## 2019-12-19 DIAGNOSIS — G4700 Insomnia, unspecified: Secondary | ICD-10-CM

## 2019-12-26 DIAGNOSIS — H25813 Combined forms of age-related cataract, bilateral: Secondary | ICD-10-CM | POA: Diagnosis not present

## 2020-01-06 ENCOUNTER — Emergency Department (HOSPITAL_COMMUNITY): Payer: Medicare Other

## 2020-01-06 ENCOUNTER — Ambulatory Visit (HOSPITAL_COMMUNITY)
Admission: EM | Admit: 2020-01-06 | Discharge: 2020-01-06 | Disposition: A | Discharge status: Home / Self Care | Payer: Medicare Other

## 2020-01-06 ENCOUNTER — Encounter (HOSPITAL_COMMUNITY): Payer: Self-pay | Admitting: Emergency Medicine

## 2020-01-06 ENCOUNTER — Other Ambulatory Visit: Payer: Self-pay

## 2020-01-06 ENCOUNTER — Encounter (HOSPITAL_COMMUNITY): Payer: Self-pay

## 2020-01-06 ENCOUNTER — Emergency Department (HOSPITAL_COMMUNITY)
Admission: EM | Admit: 2020-01-06 | Discharge: 2020-01-07 | Disposition: A | Discharge status: Home / Self Care | Payer: Medicare Other | Attending: Emergency Medicine | Admitting: Emergency Medicine

## 2020-01-06 DIAGNOSIS — M25552 Pain in left hip: Secondary | ICD-10-CM | POA: Diagnosis not present

## 2020-01-06 DIAGNOSIS — Y9301 Activity, walking, marching and hiking: Secondary | ICD-10-CM | POA: Insufficient documentation

## 2020-01-06 DIAGNOSIS — N401 Enlarged prostate with lower urinary tract symptoms: Secondary | ICD-10-CM | POA: Diagnosis not present

## 2020-01-06 DIAGNOSIS — S72112D Displaced fracture of greater trochanter of left femur, subsequent encounter for closed fracture with routine healing: Secondary | ICD-10-CM | POA: Diagnosis not present

## 2020-01-06 DIAGNOSIS — S0990XA Unspecified injury of head, initial encounter: Secondary | ICD-10-CM | POA: Diagnosis not present

## 2020-01-06 DIAGNOSIS — G4700 Insomnia, unspecified: Secondary | ICD-10-CM | POA: Insufficient documentation

## 2020-01-06 DIAGNOSIS — S7002XA Contusion of left hip, initial encounter: Secondary | ICD-10-CM | POA: Diagnosis not present

## 2020-01-06 DIAGNOSIS — N200 Calculus of kidney: Secondary | ICD-10-CM | POA: Diagnosis not present

## 2020-01-06 DIAGNOSIS — W1809XA Striking against other object with subsequent fall, initial encounter: Secondary | ICD-10-CM | POA: Insufficient documentation

## 2020-01-06 DIAGNOSIS — R059 Cough, unspecified: Secondary | ICD-10-CM | POA: Diagnosis not present

## 2020-01-06 DIAGNOSIS — Z96642 Presence of left artificial hip joint: Secondary | ICD-10-CM | POA: Diagnosis not present

## 2020-01-06 DIAGNOSIS — S72112A Displaced fracture of greater trochanter of left femur, initial encounter for closed fracture: Secondary | ICD-10-CM | POA: Diagnosis not present

## 2020-01-06 DIAGNOSIS — S7002XD Contusion of left hip, subsequent encounter: Secondary | ICD-10-CM | POA: Insufficient documentation

## 2020-01-06 DIAGNOSIS — R058 Other specified cough: Secondary | ICD-10-CM | POA: Insufficient documentation

## 2020-01-06 DIAGNOSIS — Z043 Encounter for examination and observation following other accident: Secondary | ICD-10-CM | POA: Diagnosis not present

## 2020-01-06 DIAGNOSIS — S72402A Unspecified fracture of lower end of left femur, initial encounter for closed fracture: Secondary | ICD-10-CM | POA: Diagnosis not present

## 2020-01-06 DIAGNOSIS — T148XXA Other injury of unspecified body region, initial encounter: Secondary | ICD-10-CM

## 2020-01-06 NOTE — ED Provider Notes (Signed)
[facility] EMERGENCY DEPARTMENT Provider Note   CSN: [id number] Arrival date & time: 01/06/20  1716     History Chief Complaint  Patient presents with  . Fall    Jacob Mitchell is a 74 y.o. male.  74yo M w/ PMH including L hip replacement, kidney stones who p/w fall and L thigh pain. Patient states that around 8 AM this morning, he was walking at home and tripped, falling against a wall and hitting the side of his head, his left shoulder, and his left thigh. He states he did not lose consciousness and denies any pain in his head, neck, shoulder, or left hip. He reports moderate to severe, constant pain in his left lateral thigh where his leg hit the wall. He has not been able to bear much weight on his leg although he has stood a few times. No anticoagulant use. No vomiting, confusion, ataxia, or vision problems.  Patient has had 4 previous hip replacements in the left hip, most recently in [place] earlier this year.  The history is provided by the patient.  Fall       Past Medical History:  Diagnosis Date  . Benign localized prostatic hyperplasia with lower urinary tract symptoms (LUTS)   . Complication of anesthesia    pt says he gets "restless" when he is waking up and nurses have had to "hold him down"  . Diverticulosis of colon   . History of colon polyps   . History of kidney stones   . History of MRSA infection 2007   post right shoulder surgery  . History of urinary retention 05/31/2017   due to BPH  . Incomplete emptying of bladder   . OA (osteoarthritis)   . Peripheral neuropathy    feet-- hx frost bite  . Pinched nerve in neck    right index and middle fingers numbness  . Wears glasses   . Wears hearing aid in both ears     Patient Active Problem List   Diagnosis Date Noted  . Cough 05/14/2018  . Insomnia 10/21/2017  . Obese 10/15/2016  . 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 History of nephrolithiasis 07/24/2016  . Neuropathy  07/24/2016  . S/P left THA, AA 11/23/2015    Past Surgical History:  Procedure Laterality Date  . CYST REMOVAL LEG Right 1975  . CYSTOSCOPY WITH INSERTION OF UROLIFT N/A 07/20/2017   Procedure: CYSTOSCOPY WITH INSERTION OF UROLIFT;  Surgeon: Cleon Gustin, MD;  Location: [facility];  Service: Urology;  Laterality: N/A;  . DISTAL CLAVICLE EXCISION Left 1977   left shoulder  . ELBOW SURGERY Right 1969;  1980  . HAND SURGERY  1968  . LAMINECTOMY  1970   L4-5  . LUMBAR FUSION  2013  approx.   L1--L5  . ROTATOR CUFF REPAIR Right x5    last one 2007   5 surgeries in 9 days--- original repair then 4 sx'  I&D sx's for MRSA infection  . SHOULDER ARTHROSCOPY WITH DISTAL CLAVICLE RESECTION Left 1977  . SHOULDER SURGERY Left 1976  . TOTAL HIP ARTHROPLASTY Left 11/23/2015   Procedure: LEFT TOTAL HIP ARTHROPLASTY ANTERIOR APPROACH;  Surgeon: Paralee Cancel, MD;  Location: [facility];  Service: Orthopedics;  Laterality: Left;  . TOTAL HIP REVISION     10/14/16 Dr. Alvan Dame  . TOTAL HIP REVISION Left 10/14/2016   Procedure: TOTAL HIP REVISION POSTERIOR APPROACH;  Surgeon: Paralee Cancel, MD;  Location: [facility];  Service:  Orthopedics;  Laterality: Left; (total of 4 hip surgeries in all)       Family History  Problem Relation Age of Onset  . Alcohol abuse Father   . Drug abuse Brother     Social History   Tobacco Use  . Smoking status: Never Smoker  . Smokeless tobacco: Never Used  Vaping Use  . Vaping Use: Never used  Substance Use Topics  . Alcohol use: No  . Drug use: No    Home Medications Prior to Admission medications   Medication Sig Start Date End Date Taking? Authorizing Provider  traZODone (DESYREL) 100 MG tablet TAKE ONE TABLET BY MOUTH EVERY NIGHT AT BEDTIME AS NEEDED FOR SLEEP 12/19/19   Nche, Charlene Brooke, NP  fluticasone (FLONASE) 50 MCG/ACT nasal spray Place 2 sprays into both nostrils daily. Patient not taking: Reported on 09/06/2018 05/14/18 09/06/18  Luetta Nutting, DO    Allergies    Aspartame and phenylalanine  Review of Systems   Review of Systems All other systems reviewed and are negative except that which was mentioned in HPI  Physical Exam Updated Vital Signs BP (!) 99/58 (BP Location: Right Arm)   Pulse 77   Temp 98.2 F (36.8 C) (Oral)   Resp 18   Ht 6\' 6"  (1.981 m)   Wt (!) 137.9 kg   SpO2 98%   BMI 35.13 kg/m   Physical Exam Vitals and nursing note reviewed.  Constitutional:      General: He is not in acute distress.    Appearance: He is well-developed.  HENT:     Head: Normocephalic and atraumatic.     Nose: Nose normal.  Eyes:     Conjunctiva/sclera: Conjunctivae normal.     Pupils: Pupils are equal, round, and reactive to light.  Cardiovascular:     Rate and Rhythm: Normal rate and regular rhythm.     Pulses: Normal pulses.     Heart sounds: Normal heart sounds. No murmur heard.   Pulmonary:     Effort: Pulmonary effort is normal.     Breath sounds: Normal breath sounds.  Abdominal:     General: There is no distension.     Palpations: Abdomen is soft.     Tenderness: There is no abdominal tenderness.  Musculoskeletal:        General: Swelling and tenderness present. No deformity.     Cervical back: Neck supple. No tenderness.     Comments: Firm edema and tenderness L lateral thigh without obvious deformity; normal flexion and internal/external rotation of L hip; no tenderness at L knee  Skin:    General: Skin is warm and dry.  Neurological:     Mental Status: He is alert and oriented to person, place, and time.     Comments: Fluent speech  Psychiatric:        Mood and Affect: Mood normal.        Judgment: Judgment normal.     ED Results / Procedures / Treatments   Labs (all labs ordered are listed, but only abnormal results are displayed) Labs Reviewed - No data to display  EKG None  Radiology DG Hip Unilat W or Wo Pelvis 1 View Left  Result Date: 01/06/2020 CLINICAL DATA:  Fall, left  hip pain, trouble walking. EXAM: DG HIP (WITH OR WITHOUT PELVIS) 1V*L* COMPARISON:  X-ray left hip 09/06/2018 FINDINGS: Status post left hip arthroplasty with constrained ring. There is no evidence of hip fracture or dislocation of the left hip.  Frontal view of the pelvis and right hip are grossly unremarkable. Surgical hardware overlying lumbosacral spine noted. There is no evidence of severe arthropathy or other focal bone abnormality. IMPRESSION: No acute displaced fracture or dislocation in a patient status post left hip arthroplasty. Electronically Signed   By: Iven Finn M.D.   On: 01/06/2020 19:20   DG Femur Min 2 Views Left  Result Date: 01/06/2020 CLINICAL DATA:  [place] onto artificial left hip EXAM: LEFT FEMUR 2 VIEWS COMPARISON:  01/06/2020, 09/06/2018 FINDINGS: Left hip replacement with normal alignment. No acute displaced fracture or malalignment. IMPRESSION: Left hip replacement without acute osseous abnormality. Electronically Signed   By: Donavan Foil M.D.   On: 01/06/2020 20:29    Procedures Procedures (including critical care time)  Medications Ordered in ED Medications - No data to display  ED Course  I have reviewed the triage vital signs and the nursing notes.  Pertinent imaging results that were available during my care of the patient were reviewed by me and considered in my medical decision making (see chart for details).    MDM Rules/Calculators/A&P                          Pt w/ isolated L thigh pain after fall. It has been >12 hours since fall with no concerning sx suggestive of intracranial injury. Discussed options of head CT vs observation, pt felt comfortable foregoing head imaging currently. XR hip and femur without obvious fracture. Given degree of thigh swelling and difficulty ambulating, I am concerned about possible occult fracture near the distal insertion of prosthesis. Will obtain MRI to evaluate for occult fracture. If MRI shows soft tissue injury  only, anticipate patient will be eligible for discharge. Patient signed out pending imaging results. Final Clinical Impression(s) / ED Diagnoses Final diagnoses:  None    Rx / DC Orders ED Discharge Orders    None       Konstantin Lehnen, Wenda Overland, MD 01/06/20 2344

## 2020-01-06 NOTE — ED Notes (Signed)
Patient transported to X-ray 

## 2020-01-06 NOTE — ED Triage Notes (Signed)
Pt st's he fell this am and landed on his left hip.  Pt c/o pain to left thigh.  Pt has had 4 hip replacements on left

## 2020-01-06 NOTE — ED Notes (Signed)
Pt transported to MRI 

## 2020-01-06 NOTE — ED Triage Notes (Signed)
Patient states he fell this am and landed on his left hip. Patient has had 4 hip replacements on the left side. Pt is unable to put weight on his left leg / side since the fall. Pt is aox4 and is in a wheelchair.

## 2020-01-06 NOTE — ED Notes (Signed)
Pt back from X-ray.  

## 2020-01-06 NOTE — ED Provider Notes (Addendum)
11:30 PM  Assumed care.  Patient here with left thigh pain after injury.  History of multiple left hip replacements.  MRI of the left femur pending to rule out occult fracture.  1:05 AM  Pt's MRI shows hematoma to the left hip and displaced fracture involving the periprosthetic greater trochanter.  Discussed with Dr. Doran Durand on-call for Dr. Alvan Dame.  Patient is very eager to go home and would like to follow-up in the office next week which Dr. Doran Durand feels is reasonable.  We will have him use his walker at this time and will discharge with pain medication.  Patient is not on any blood thinners.  Recommended rest, elevation, ice.  Patient also states that he has been having a nonproductive cough for a week.  No hypoxia or respiratory distress.  Speaking full sentences.  Have offered COVID-19 testing which he declines.  States that he has been vaccinated.  Requesting prescription for [employer]  Mentating normally.  No focal neurologic deficits.  Patient has been monitored for several hours.  Will discharge home.   At this time, I do not feel there is any life-threatening condition present. I have reviewed, interpreted and discussed all results (EKG, imaging, lab, urine as appropriate) and exam findings with patient/family. I have reviewed nursing notes and appropriate previous records.  I feel the patient is safe to be discharged home without further emergent workup and can continue workup as an outpatient as needed. Discussed usual and customary return precautions. Patient/family verbalize understanding and are comfortable with this plan.  Outpatient follow-up has been provided as needed. All questions have been answered.        Lennyn Bellanca, Delice Bison, DO 01/07/20 [phone]

## 2020-01-07 DIAGNOSIS — S72402A Unspecified fracture of lower end of left femur, initial encounter for closed fracture: Secondary | ICD-10-CM | POA: Diagnosis not present

## 2020-01-07 MED ORDER — DOCUSATE SODIUM 100 MG PO CAPS: 100.0000 mg | ORAL_CAPSULE | Freq: Two times a day (BID) | ORAL | 0 refills | Status: DC

## 2020-01-07 MED ORDER — HYDROCODONE-ACETAMINOPHEN 5-325 MG PO TABS: 1.0000 | ORAL_TABLET | ORAL | 0 refills | Status: DC | PRN

## 2020-01-07 MED ORDER — BENZONATATE 100 MG PO CAPS: 100.0000 mg | ORAL_CAPSULE | Freq: Three times a day (TID) | ORAL | 0 refills | Status: DC | PRN

## 2020-01-07 MED ORDER — ONDANSETRON 4 MG PO TBDP: 4.0000 mg | ORAL_TABLET | Freq: Four times a day (QID) | ORAL | 0 refills | Status: DC | PRN

## 2020-01-07 NOTE — Discharge Instructions (Addendum)
I recommend rest over the next several days and applying ice to your left hip and thigh several times a day for 15 to 20 minutes at a time.  I recommend using your walker until you have been seen and cleared by your orthopedic physician.  Please call on Monday to schedule an appointment.  Please follow-up with your doctor closely for your nonproductive cough.   You are being provided a prescription for opiates (also known as narcotics) for pain control.  Opiates can be addictive and should only be used when absolutely necessary for pain control when other alternatives do not work.  We recommend you only use them for the recommended amount of time and only as prescribed.  Please do not take with other sedative medications or alcohol.  Please do not drive, operate machinery, make important decisions while taking opiates.  Please note that these medications can be addictive and have high abuse potential.  Patients can become addicted to narcotics after only taking them for a few days.  Please keep these medications locked away from children, teenagers or any family members with history of substance abuse.  Narcotic pain medicine may also make you constipated.  You may use over-the-counter medications such as MiraLAX, Colace to prevent constipation.  If you become constipated you may use over-the-counter enemas as needed.  Itching and nausea are common side effects of narcotic pain medication.  If you develop uncontrolled vomiting or a rash, please stop these medications.

## 2020-01-11 ENCOUNTER — Encounter: Payer: Self-pay | Admitting: Nurse Practitioner

## 2020-01-11 ENCOUNTER — Telehealth (INDEPENDENT_AMBULATORY_CARE_PROVIDER_SITE_OTHER): Payer: Medicare Other | Admitting: Nurse Practitioner

## 2020-01-11 DIAGNOSIS — J0140 Acute pansinusitis, unspecified: Secondary | ICD-10-CM

## 2020-01-11 DIAGNOSIS — J208 Acute bronchitis due to other specified organisms: Secondary | ICD-10-CM | POA: Diagnosis not present

## 2020-01-11 DIAGNOSIS — S72112A Displaced fracture of greater trochanter of left femur, initial encounter for closed fracture: Secondary | ICD-10-CM | POA: Diagnosis not present

## 2020-01-11 MED ORDER — AZITHROMYCIN 250 MG PO TABS: 250.0000 mg | ORAL_TABLET | Freq: Every day | ORAL | 0 refills | Status: DC

## 2020-01-11 MED ORDER — FLUTICASONE PROPIONATE 50 MCG/ACT NA SUSP: 2.0000 | Freq: Every day | NASAL | 0 refills | Status: DC

## 2020-01-11 MED ORDER — GUAIFENESIN-CODEINE 100-10 MG/5ML PO SYRP: 5.0000 mL | ORAL_SOLUTION | Freq: Three times a day (TID) | ORAL | 0 refills | Status: DC | PRN

## 2020-01-11 NOTE — Progress Notes (Signed)
Virtual Visit via Video Note  I connected with@ on 01/11/20 at 11:00 AM EST by a video enabled telemedicine application and verified that I am speaking with the correct person using two identifiers.  Location: Patient:Home Provider: Office Participants: patient, wife, and provider  I discussed the limitations of evaluation and management by telemedicine and the availability of in person appointments. I also discussed with the patient that there may be a patient responsible charge related to this service. The patient expressed understanding and agreed to proceed.  CC:Pt c/o dry cough x3 weeks. Pt denies fever, NVD. Pt states he has been taking benzonatate and it is not helping  History of Present Illness: Cough This is a new problem. The current episode started 1 to 4 weeks ago. The problem has been unchanged. The problem occurs constantly. The cough is non-productive. Associated symptoms include headaches, nasal congestion, postnasal drip and rhinorrhea. Pertinent negatives include no chest pain, chills, fever, heartburn, hemoptysis, myalgias, rash, sore throat, shortness of breath, sweats, weight loss or wheezing. The symptoms are aggravated by lying down and cold air. He has tried prescription cough suppressant and OTC cough suppressant for the symptoms. The treatment provided no relief.  no recent travel, no known sick contact  Observations/Objective: Physical Exam Constitutional:      General: He is not in acute distress. Pulmonary:     Effort: Pulmonary effort is normal.  Neurological:     Mental Status: He is alert and oriented to person, place, and time.   coughing intermittently during video call  Assessment and Plan: Jacob Mitchell was seen today for acute visit.  Diagnoses and all orders for this visit:  Acute bronchitis due to other specified organisms -     fluticasone (FLONASE) 50 MCG/ACT nasal spray; Place 2 sprays into both nostrils daily. -     guaiFENesin-codeine  (ROBITUSSIN AC) 100-10 MG/5ML syrup; Take 5 mLs by mouth 3 (three) times daily as needed for cough. -     azithromycin (ZITHROMAX Z-PAK) 250 MG tablet; Take 1 tablet (250 mg total) by mouth daily. Take 2tabs on first day, then 1tab once a day till complete  Acute non-recurrent pansinusitis -     fluticasone (FLONASE) 50 MCG/ACT nasal spray; Place 2 sprays into both nostrils daily. -     guaiFENesin-codeine (ROBITUSSIN AC) 100-10 MG/5ML syrup; Take 5 mLs by mouth 3 (three) times daily as needed for cough. -     azithromycin (ZITHROMAX Z-PAK) 250 MG tablet; Take 1 tablet (250 mg total) by mouth daily. Take 2tabs on first day, then 1tab once a day till complete   Follow Up Instructions: See above Need CXR if no improvement in 1week   I discussed the assessment and treatment plan with the patient. The patient was provided an opportunity to ask questions and all were answered. The patient agreed with the plan and demonstrated an understanding of the instructions.   The patient was advised to call back or seek an in-person evaluation if the symptoms worsen or if the condition fails to improve as anticipated.  Wilfred Lacy, NP

## 2020-01-12 DIAGNOSIS — H35372 Puckering of macula, left eye: Secondary | ICD-10-CM | POA: Diagnosis not present

## 2020-01-12 DIAGNOSIS — H2512 Age-related nuclear cataract, left eye: Secondary | ICD-10-CM | POA: Diagnosis not present

## 2020-01-12 DIAGNOSIS — H25813 Combined forms of age-related cataract, bilateral: Secondary | ICD-10-CM | POA: Diagnosis not present

## 2020-01-12 DIAGNOSIS — Z6835 Body mass index (BMI) 35.0-35.9, adult: Secondary | ICD-10-CM | POA: Diagnosis not present

## 2020-01-12 DIAGNOSIS — Z79899 Other long term (current) drug therapy: Secondary | ICD-10-CM | POA: Diagnosis not present

## 2020-01-12 DIAGNOSIS — E669 Obesity, unspecified: Secondary | ICD-10-CM | POA: Diagnosis not present

## 2020-01-12 NOTE — Progress Notes (Addendum)
COVID Vaccine Completed: Yes Date COVID Vaccine completed: 10/2019 COVID vaccine manufacturer: [place]     PCP - [name], NP last office visit 01/11/20 in epic Cardiologist - N/A  Chest x-ray - greater than 1 year in epic EKG - N/A Stress Test - greater than 2years ECHO - N/A Cardiac Cath - N/A Pacemaker/ICD device last checked: N/A  Sleep Study - N/A CPAP - N/A  Fasting Blood Sugar - N/A Checks Blood Sugar _N/A___ times a day  Blood Thinner Instructions: N/A Aspirin Instructions: N/A Last Dose: N/A  Activity level: Able to exercise without symptoms     Anesthesia review: N/A  Patient denies shortness of breath, fever, cough and chest pain at PAT appointment   Patient verbalized understanding of instructions that were given to them at the PAT appointment. Patient was also instructed that they will need to review over the PAT instructions again at home before surgery.

## 2020-01-13 ENCOUNTER — Encounter (HOSPITAL_COMMUNITY): Payer: Self-pay | Admitting: Orthopedic Surgery

## 2020-01-13 ENCOUNTER — Other Ambulatory Visit: Payer: Self-pay

## 2020-01-13 HISTORY — PX: CATARACT EXTRACTION: SUR2

## 2020-01-14 ENCOUNTER — Other Ambulatory Visit (HOSPITAL_COMMUNITY)
Admission: RE | Admit: 2020-01-14 | Discharge: 2020-01-14 | Disposition: A | Discharge status: Home / Self Care | Payer: Medicare Other | Source: Ambulatory Visit | Attending: Orthopedic Surgery | Admitting: Orthopedic Surgery

## 2020-01-14 DIAGNOSIS — Z20822 Contact with and (suspected) exposure to covid-19: Secondary | ICD-10-CM | POA: Diagnosis not present

## 2020-01-14 DIAGNOSIS — Z01812 Encounter for preprocedural laboratory examination: Secondary | ICD-10-CM | POA: Insufficient documentation

## 2020-01-15 LAB — SARS CORONAVIRUS 2 (TAT 6-24 HRS): SARS Coronavirus 2: NEGATIVE

## 2020-01-16 MED ORDER — DEXTROSE 5 % IV SOLN
3.0000 g | INTRAVENOUS | Status: AC
  Administered 2020-01-17: 2 g via INTRAVENOUS
  Filled 2020-01-16: qty 3

## 2020-01-16 NOTE — H&P (Signed)
Jacob Mitchell is an 74 y.o. male.   Chief Complaint: Displaced left periprosthetic greater trochanteric femur fracture with stable femoral component HPI: Jacob Mitchell presented to the clinic for a follow up of his left hip. He has a history of total hip revision done in [place] on 04/30/19. Was seen in the ER on 01/06/20 due to him tripping and falling against the wall hitting his left side. He states he is doing better since the incident. He is using a walker.  Dr. Alvan Dame reviewed of his radiographs which reveal an oblique mildly displaced fracture of the greater trochanter. Dr. Alvan Dame discussed the implications of this injury. Given his history of instability with history of revision of his acetabular component in [place] open reduction internal fixation of this greater trochanteric femur fracture is indicated.  Risks, benefits and expectations were discussed with the patient.  Risks including but not limited to the risk of anesthesia, blood clots, nerve damage, blood vessel damage, failure of the prosthesis, infection and up to and including death.  Patient understand the risks, benefits and expectations and wishes to proceed with surgery.    Past Medical History:  Diagnosis Date  . Benign localized prostatic hyperplasia with lower urinary tract symptoms (LUTS)   . Complication of anesthesia    pt says he gets "restless" when he is waking up and nurses have had to "hold him down"  . Diverticulosis of colon   . History of colon polyps   . History of kidney stones   . History of MRSA infection 2007   post right shoulder surgery  . History of urinary retention 05/31/2017   due to BPH  . Incomplete emptying of bladder   . OA (osteoarthritis)   . Peripheral neuropathy    feet-- hx frost bite  . Pinched nerve in neck    right index and middle fingers numbness  . Wears glasses   . Wears hearing aid in both ears     Past Surgical History:  Procedure Laterality Date  . CATARACT EXTRACTION Left  01/13/2020  . COLONOSCOPY    . CYST REMOVAL LEG Right 1975  . CYSTOSCOPY WITH INSERTION OF UROLIFT N/A 07/20/2017   Procedure: CYSTOSCOPY WITH INSERTION OF UROLIFT;  Surgeon: Cleon Gustin, MD;  Location: [facility];  Service: Urology;  Laterality: N/A;  . DISTAL CLAVICLE EXCISION Left 1977   left shoulder  . ELBOW SURGERY Right 1969;  1980  . HAND SURGERY Right 1968  . LAMINECTOMY  1970   L4-5  . LUMBAR FUSION  2013  approx.   L1--L5  . ROTATOR CUFF REPAIR Right x5    last one 2007   5 surgeries in 9 days--- original repair then 4 sx'  I&D sx's for MRSA infection  . SHOULDER ARTHROSCOPY WITH DISTAL CLAVICLE RESECTION Left 1977  . SHOULDER SURGERY Left 1976  . TOTAL HIP ARTHROPLASTY Left 11/23/2015   Procedure: LEFT TOTAL HIP ARTHROPLASTY ANTERIOR APPROACH;  Surgeon: Paralee Cancel, MD;  Location: [facility];  Service: Orthopedics;  Laterality: Left;  . TOTAL HIP REVISION     10/14/16 Dr. Alvan Dame  . TOTAL HIP REVISION Left 10/14/2016   Procedure: TOTAL HIP REVISION POSTERIOR APPROACH;  Surgeon: Paralee Cancel, MD;  Location: [facility];  Service: Orthopedics;  Laterality: Left; (total of 4 hip surgeries in all)    Family History  Problem Relation Age of Onset  . Alcohol abuse Father   . Drug abuse Brother    Social History:  reports  that he has never smoked. He has never used smokeless tobacco. He reports that he does not drink alcohol and does not use drugs.  Allergies:  Allergies  Allergen Reactions  . Aspartame And Phenylalanine Palpitations    Artificial sweetener- hrt 140  went to ED    No medications prior to admission.      Review of Systems  Constitutional: Negative.   HENT: Positive for hearing loss.   Eyes: Negative.   Respiratory: Negative.   Cardiovascular: Negative.   Gastrointestinal: Negative.   Genitourinary: Positive for frequency.  Musculoskeletal: Positive for joint pain.  Skin: Negative.   Neurological: Negative.   Endo/Heme/Allergies:  Negative.   Psychiatric/Behavioral: Negative.       Height 6\' 6"  (1.981 m), weight 136.1 kg. Physical Exam Constitutional:      Appearance: He is well-developed.  HENT:     Head: Normocephalic.  Eyes:     Pupils: Pupils are equal, round, and reactive to light.  Neck:     Thyroid: No thyromegaly.     Vascular: No JVD.     Trachea: No tracheal deviation.  Cardiovascular:     Rate and Rhythm: Normal rate and regular rhythm.  Pulmonary:     Effort: Pulmonary effort is normal. No respiratory distress.     Breath sounds: Normal breath sounds. No wheezing.  Abdominal:     Palpations: Abdomen is soft.     Tenderness: There is no abdominal tenderness. There is no guarding.  Musculoskeletal:     Cervical back: Neck supple.     Left hip: Tenderness and bony tenderness present. No deformity. Decreased range of motion. Decreased strength.  Lymphadenopathy:     Cervical: No cervical adenopathy.  Skin:    General: Skin is warm and dry.  Neurological:     Mental Status: He is alert and oriented to person, place, and time.      Assessment/Plan Displaced left periprosthetic greater trochanteric femur fracture with stable femoral component   Dr. Alvan Dame plans on preforming an ORIF of the left greater torch fracture with the use of a long cable claw plate from [employer].   Risks, benefits and expectations were discussed with the patient.  Risks including but not limited to the risk of anesthesia, blood clots, nerve damage, blood vessel damage, failure of the prosthesis, infection and up to and including death.  Patient understand the risks, benefits and expectations and wishes to proceed with surgery   Pricilla Loveless, PA-C 01/16/2020, 9:06 PM

## 2020-01-17 ENCOUNTER — Inpatient Hospital Stay (HOSPITAL_COMMUNITY)
Admission: RE | Admit: 2020-01-17 | Discharge: 2020-01-19 | DRG: 481 | Disposition: A | Discharge status: Home / Self Care | Payer: Medicare Other | Attending: Orthopedic Surgery | Admitting: Orthopedic Surgery

## 2020-01-17 ENCOUNTER — Inpatient Hospital Stay (HOSPITAL_COMMUNITY): Payer: Medicare Other | Admitting: Anesthesiology

## 2020-01-17 ENCOUNTER — Telehealth (HOSPITAL_COMMUNITY): Payer: Self-pay | Admitting: *Deleted

## 2020-01-17 ENCOUNTER — Observation Stay (HOSPITAL_COMMUNITY): Payer: Medicare Other

## 2020-01-17 ENCOUNTER — Encounter (HOSPITAL_COMMUNITY)
Admission: RE | Disposition: A | Discharge status: Home / Self Care | Payer: Self-pay | Source: Home / Self Care | Attending: Orthopedic Surgery

## 2020-01-17 ENCOUNTER — Encounter (HOSPITAL_COMMUNITY): Payer: Self-pay | Admitting: Orthopedic Surgery

## 2020-01-17 ENCOUNTER — Other Ambulatory Visit: Payer: Self-pay

## 2020-01-17 DIAGNOSIS — E669 Obesity, unspecified: Secondary | ICD-10-CM | POA: Diagnosis not present

## 2020-01-17 DIAGNOSIS — Z471 Aftercare following joint replacement surgery: Secondary | ICD-10-CM | POA: Diagnosis not present

## 2020-01-17 DIAGNOSIS — Z6836 Body mass index (BMI) 36.0-36.9, adult: Secondary | ICD-10-CM

## 2020-01-17 DIAGNOSIS — M9702XA Periprosthetic fracture around internal prosthetic left hip joint, initial encounter: Secondary | ICD-10-CM | POA: Diagnosis present

## 2020-01-17 DIAGNOSIS — Z981 Arthrodesis status: Secondary | ICD-10-CM | POA: Diagnosis not present

## 2020-01-17 DIAGNOSIS — G4700 Insomnia, unspecified: Secondary | ICD-10-CM | POA: Diagnosis not present

## 2020-01-17 DIAGNOSIS — S72112A Displaced fracture of greater trochanter of left femur, initial encounter for closed fracture: Principal | ICD-10-CM | POA: Diagnosis present

## 2020-01-17 DIAGNOSIS — Z96642 Presence of left artificial hip joint: Secondary | ICD-10-CM | POA: Diagnosis not present

## 2020-01-17 DIAGNOSIS — S72113A Displaced fracture of greater trochanter of unspecified femur, initial encounter for closed fracture: Secondary | ICD-10-CM | POA: Diagnosis present

## 2020-01-17 DIAGNOSIS — W010XXA Fall on same level from slipping, tripping and stumbling without subsequent striking against object, initial encounter: Secondary | ICD-10-CM | POA: Diagnosis present

## 2020-01-17 DIAGNOSIS — S72002A Fracture of unspecified part of neck of left femur, initial encounter for closed fracture: Secondary | ICD-10-CM | POA: Diagnosis not present

## 2020-01-17 DIAGNOSIS — Z96649 Presence of unspecified artificial hip joint: Secondary | ICD-10-CM

## 2020-01-17 HISTORY — PX: ORIF HIP FRACTURE: SHX2125

## 2020-01-17 LAB — CBC
HCT: 36 % — ABNORMAL LOW (ref 39.0–52.0)
Hemoglobin: 11.5 g/dL — ABNORMAL LOW (ref 13.0–17.0)
MCH: 29.3 pg (ref 26.0–34.0)
MCHC: 31.9 g/dL (ref 30.0–36.0)
MCV: 91.8 fL (ref 80.0–100.0)
Platelets: 299 10*3/uL (ref 150–400)
RBC: 3.92 MIL/uL — ABNORMAL LOW (ref 4.22–5.81)
RDW: 13.9 % (ref 11.5–15.5)
WBC: 8.4 10*3/uL (ref 4.0–10.5)
nRBC: 0 % (ref 0.0–0.2)

## 2020-01-17 LAB — PROTIME-INR
INR: 1.1 (ref 0.8–1.2)
Prothrombin Time: 13.5 seconds (ref 11.4–15.2)

## 2020-01-17 LAB — BASIC METABOLIC PANEL
Anion gap: 9 (ref 5–15)
BUN: 28 mg/dL — ABNORMAL HIGH (ref 8–23)
CO2: 24 mmol/L (ref 22–32)
Calcium: 8.9 mg/dL (ref 8.9–10.3)
Chloride: 104 mmol/L (ref 98–111)
Creatinine, Ser: 1.07 mg/dL (ref 0.61–1.24)
GFR, Estimated: >60 mL/min (ref >60)
Glucose, Bld: 96 mg/dL (ref 70–99)
Potassium: 4 mmol/L (ref 3.5–5.1)
Sodium: 137 mmol/L (ref 135–145)

## 2020-01-17 LAB — POCT I-STAT, CHEM 8
BUN: 25 mg/dL — ABNORMAL HIGH (ref 8–23)
BUN: 26 mg/dL — ABNORMAL HIGH (ref 8–23)
Calcium, Ion: 1.29 mmol/L (ref 1.15–1.40)
Calcium, Ion: 1.24 mmol/L (ref 1.15–1.40)
Chloride: 103 mmol/L (ref 98–111)
Chloride: 103 mmol/L (ref 98–111)
Creatinine, Ser: 1 mg/dL (ref 0.61–1.24)
Creatinine, Ser: 1 mg/dL (ref 0.61–1.24)
Glucose, Bld: 108 mg/dL — ABNORMAL HIGH (ref 70–99)
Glucose, Bld: 112 mg/dL — ABNORMAL HIGH (ref 70–99)
HCT: 31 % — ABNORMAL LOW (ref 39.0–52.0)
HCT: 32 % — ABNORMAL LOW (ref 39.0–52.0)
Hemoglobin: 10.5 g/dL — ABNORMAL LOW (ref 13.0–17.0)
Hemoglobin: 10.9 g/dL — ABNORMAL LOW (ref 13.0–17.0)
Potassium: 4.2 mmol/L (ref 3.5–5.1)
Potassium: 4.2 mmol/L (ref 3.5–5.1)
Sodium: 138 mmol/L (ref 135–145)
Sodium: 138 mmol/L (ref 135–145)
TCO2: 28 mmol/L (ref 22–32)
TCO2: 24 mmol/L (ref 22–32)

## 2020-01-17 LAB — TYPE AND SCREEN
ABO/RH(D): O POS
Antibody Screen: NEGATIVE

## 2020-01-17 LAB — APTT: aPTT: 32 seconds (ref 24–36)

## 2020-01-17 SURGERY — OPEN REDUCTION INTERNAL FIXATION HIP
Anesthesia: General | Site: Hip | Laterality: Left

## 2020-01-17 MED ORDER — METHOCARBAMOL 500 MG PO TABS
500.0000 mg | ORAL_TABLET | Freq: Four times a day (QID) | ORAL | Status: DC | PRN
  Administered 2020-01-19: 500 mg via ORAL
  Filled 2020-01-17: qty 1

## 2020-01-17 MED ORDER — MIDAZOLAM HCL 2 MG/2ML IJ SOLN
INTRAMUSCULAR | Status: DC | PRN
  Administered 2020-01-17: 2 mg via INTRAVENOUS

## 2020-01-17 MED ORDER — PHENOL 1.4 % MT LIQD: 1.0000 | OROMUCOSAL | Status: DC | PRN

## 2020-01-17 MED ORDER — METHOCARBAMOL 500 MG IVPB - SIMPLE MED
INTRAVENOUS | Status: AC
  Filled 2020-01-17: qty 50

## 2020-01-17 MED ORDER — BISACODYL 10 MG RE SUPP: 10.0000 mg | Freq: Every day | RECTAL | Status: DC | PRN

## 2020-01-17 MED ORDER — ACETAMINOPHEN 325 MG PO TABS
325.0000 mg | ORAL_TABLET | Freq: Four times a day (QID) | ORAL | Status: DC | PRN
  Administered 2020-01-18: 650 mg via ORAL
  Filled 2020-01-17: qty 2

## 2020-01-17 MED ORDER — FENTANYL CITRATE (PF) 100 MCG/2ML IJ SOLN
25.0000 ug | INTRAMUSCULAR | Status: DC | PRN
  Administered 2020-01-17 (×2): 50 ug via INTRAVENOUS

## 2020-01-17 MED ORDER — ACETAMINOPHEN 500 MG PO TABS
1000.0000 mg | ORAL_TABLET | Freq: Once | ORAL | Status: AC
  Administered 2020-01-17: 1000 mg via ORAL
  Filled 2020-01-17: qty 2

## 2020-01-17 MED ORDER — ORAL CARE MOUTH RINSE: 15.0000 mL | Freq: Once | OROMUCOSAL | Status: AC

## 2020-01-17 MED ORDER — FENTANYL CITRATE (PF) 100 MCG/2ML IJ SOLN
INTRAMUSCULAR | Status: AC
  Filled 2020-01-17: qty 2

## 2020-01-17 MED ORDER — TRANEXAMIC ACID-NACL 1000-0.7 MG/100ML-% IV SOLN
1000.0000 mg | INTRAVENOUS | Status: AC
  Administered 2020-01-17: 1000 mg via INTRAVENOUS
  Filled 2020-01-17: qty 100

## 2020-01-17 MED ORDER — SODIUM CHLORIDE 0.9 % IV SOLN: INTRAVENOUS | Status: DC

## 2020-01-17 MED ORDER — BETAXOLOL HCL 0.25 % OP SUSP
1.0000 [drp] | Freq: Every day | OPHTHALMIC | Status: DC
  Administered 2020-01-18 – 2020-01-19 (×2): 1 [drp] via OPHTHALMIC
  Filled 2020-01-17: qty 10

## 2020-01-17 MED ORDER — FERROUS SULFATE 325 (65 FE) MG PO TABS
325.0000 mg | ORAL_TABLET | Freq: Three times a day (TID) | ORAL | Status: DC
  Administered 2020-01-18 – 2020-01-19 (×3): 325 mg via ORAL
  Filled 2020-01-17 (×3): qty 1

## 2020-01-17 MED ORDER — CEFAZOLIN SODIUM-DEXTROSE 2-4 GM/100ML-% IV SOLN
2.0000 g | Freq: Four times a day (QID) | INTRAVENOUS | Status: AC
  Administered 2020-01-17 – 2020-01-18 (×2): 2 g via INTRAVENOUS
  Filled 2020-01-17 (×2): qty 100

## 2020-01-17 MED ORDER — ASPIRIN 81 MG PO CHEW
81.0000 mg | CHEWABLE_TABLET | Freq: Two times a day (BID) | ORAL | Status: DC
  Administered 2020-01-17 – 2020-01-19 (×4): 81 mg via ORAL
  Filled 2020-01-17 (×4): qty 1

## 2020-01-17 MED ORDER — ONDANSETRON HCL 4 MG/2ML IJ SOLN
INTRAMUSCULAR | Status: DC | PRN
  Administered 2020-01-17: 4 mg via INTRAVENOUS

## 2020-01-17 MED ORDER — DEXAMETHASONE SODIUM PHOSPHATE 10 MG/ML IJ SOLN: 10.0000 mg | Freq: Once | INTRAMUSCULAR | Status: DC

## 2020-01-17 MED ORDER — GUAIFENESIN-CODEINE 100-10 MG/5ML PO SYRP: 5.0000 mL | ORAL_SOLUTION | Freq: Three times a day (TID) | ORAL | Status: DC | PRN

## 2020-01-17 MED ORDER — DIPHENHYDRAMINE HCL 12.5 MG/5ML PO ELIX: 12.5000 mg | ORAL_SOLUTION | ORAL | Status: DC | PRN

## 2020-01-17 MED ORDER — SUGAMMADEX SODIUM 500 MG/5ML IV SOLN
INTRAVENOUS | Status: DC | PRN
  Administered 2020-01-17: 300 mg via INTRAVENOUS

## 2020-01-17 MED ORDER — HYDROCODONE-ACETAMINOPHEN 5-325 MG PO TABS
1.0000 | ORAL_TABLET | ORAL | Status: DC | PRN
  Administered 2020-01-18 (×2): 2 via ORAL
  Filled 2020-01-17 (×3): qty 2

## 2020-01-17 MED ORDER — ONDANSETRON HCL 4 MG/2ML IJ SOLN: 4.0000 mg | Freq: Once | INTRAMUSCULAR | Status: DC | PRN

## 2020-01-17 MED ORDER — TRAZODONE HCL 100 MG PO TABS: 100.0000 mg | ORAL_TABLET | Freq: Every evening | ORAL | Status: DC | PRN

## 2020-01-17 MED ORDER — ROCURONIUM BROMIDE 10 MG/ML (PF) SYRINGE
PREFILLED_SYRINGE | INTRAVENOUS | Status: DC | PRN
  Administered 2020-01-17: 80 mg via INTRAVENOUS
  Administered 2020-01-17: 20 mg via INTRAVENOUS

## 2020-01-17 MED ORDER — PHENYLEPHRINE HCL-NACL 10-0.9 MG/250ML-% IV SOLN
INTRAVENOUS | Status: DC | PRN
  Administered 2020-01-17: 50 ug/min via INTRAVENOUS

## 2020-01-17 MED ORDER — MAGNESIUM CITRATE PO SOLN: 1.0000 | Freq: Once | ORAL | Status: DC | PRN

## 2020-01-17 MED ORDER — LACTATED RINGERS IV SOLN: INTRAVENOUS | Status: DC

## 2020-01-17 MED ORDER — ONDANSETRON HCL 4 MG PO TABS: 4.0000 mg | ORAL_TABLET | Freq: Four times a day (QID) | ORAL | Status: DC | PRN

## 2020-01-17 MED ORDER — METOCLOPRAMIDE HCL 5 MG/ML IJ SOLN: 5.0000 mg | Freq: Three times a day (TID) | INTRAMUSCULAR | Status: DC | PRN

## 2020-01-17 MED ORDER — DEXAMETHASONE SODIUM PHOSPHATE 10 MG/ML IJ SOLN
10.0000 mg | Freq: Once | INTRAMUSCULAR | Status: AC
  Administered 2020-01-17: 10 mg via INTRAVENOUS

## 2020-01-17 MED ORDER — POLYETHYLENE GLYCOL 3350 17 G PO PACK
17.0000 g | PACK | Freq: Two times a day (BID) | ORAL | Status: DC
  Administered 2020-01-17 – 2020-01-19 (×3): 17 g via ORAL
  Filled 2020-01-17 (×4): qty 1

## 2020-01-17 MED ORDER — FENTANYL CITRATE (PF) 100 MCG/2ML IJ SOLN
INTRAMUSCULAR | Status: DC | PRN
Start: 2020-01-17 — End: 2020-01-17
  Administered 2020-01-17: 50 ug via INTRAVENOUS
  Administered 2020-01-17: 100 ug via INTRAVENOUS
  Administered 2020-01-17: 50 ug via INTRAVENOUS

## 2020-01-17 MED ORDER — 0.9 % SODIUM CHLORIDE (POUR BTL) OPTIME
TOPICAL | Status: DC | PRN
Start: 2020-01-17 — End: 2020-01-17
  Administered 2020-01-17: 1000 mL

## 2020-01-17 MED ORDER — GUAIFENESIN-CODEINE 100-10 MG/5ML PO SOLN: 5.0000 mL | Freq: Three times a day (TID) | ORAL | Status: DC | PRN

## 2020-01-17 MED ORDER — DOCUSATE SODIUM 100 MG PO CAPS
100.0000 mg | ORAL_CAPSULE | Freq: Two times a day (BID) | ORAL | Status: DC
  Administered 2020-01-17 – 2020-01-19 (×4): 100 mg via ORAL
  Filled 2020-01-17 (×4): qty 1

## 2020-01-17 MED ORDER — LIDOCAINE HCL (CARDIAC) PF 100 MG/5ML IV SOSY
PREFILLED_SYRINGE | INTRAVENOUS | Status: DC | PRN
  Administered 2020-01-17: 80 mg via INTRATRACHEAL

## 2020-01-17 MED ORDER — HYDROMORPHONE HCL 1 MG/ML IJ SOLN: 0.5000 mg | INTRAMUSCULAR | Status: DC | PRN

## 2020-01-17 MED ORDER — METHOCARBAMOL 500 MG IVPB - SIMPLE MED
500.0000 mg | Freq: Four times a day (QID) | INTRAVENOUS | Status: DC | PRN
  Administered 2020-01-17: 500 mg via INTRAVENOUS
  Filled 2020-01-17: qty 50

## 2020-01-17 MED ORDER — TRANEXAMIC ACID-NACL 1000-0.7 MG/100ML-% IV SOLN
1000.0000 mg | Freq: Once | INTRAVENOUS | Status: AC
  Administered 2020-01-17: 1000 mg via INTRAVENOUS
  Filled 2020-01-17: qty 100

## 2020-01-17 MED ORDER — CELECOXIB 200 MG PO CAPS
200.0000 mg | ORAL_CAPSULE | Freq: Two times a day (BID) | ORAL | Status: DC
  Administered 2020-01-17 – 2020-01-19 (×4): 200 mg via ORAL
  Filled 2020-01-17 (×4): qty 1

## 2020-01-17 MED ORDER — MENTHOL 3 MG MT LOZG: 1.0000 | LOZENGE | OROMUCOSAL | Status: DC | PRN

## 2020-01-17 MED ORDER — ALUM & MAG HYDROXIDE-SIMETH 200-200-20 MG/5ML PO SUSP: 15.0000 mL | ORAL | Status: DC | PRN

## 2020-01-17 MED ORDER — PREDNISOLONE ACETATE 1 % OP SUSP
1.0000 [drp] | Freq: Four times a day (QID) | OPHTHALMIC | Status: DC
  Administered 2020-01-17 – 2020-01-19 (×5): 1 [drp] via OPHTHALMIC
  Filled 2020-01-17: qty 5

## 2020-01-17 MED ORDER — ONDANSETRON HCL 4 MG/2ML IJ SOLN: 4.0000 mg | Freq: Four times a day (QID) | INTRAMUSCULAR | Status: DC | PRN

## 2020-01-17 MED ORDER — FENTANYL CITRATE (PF) 250 MCG/5ML IJ SOLN
INTRAMUSCULAR | Status: AC
  Filled 2020-01-17: qty 5

## 2020-01-17 MED ORDER — HYDROCODONE-ACETAMINOPHEN 7.5-325 MG PO TABS
1.0000 | ORAL_TABLET | ORAL | Status: DC | PRN
  Administered 2020-01-19: 2 via ORAL
  Filled 2020-01-17: qty 2

## 2020-01-17 MED ORDER — CHLORHEXIDINE GLUCONATE 0.12 % MT SOLN
15.0000 mL | Freq: Once | OROMUCOSAL | Status: AC
  Administered 2020-01-17: 15 mL via OROMUCOSAL

## 2020-01-17 MED ORDER — PHENYLEPHRINE 40 MCG/ML (10ML) SYRINGE FOR IV PUSH (FOR BLOOD PRESSURE SUPPORT)
PREFILLED_SYRINGE | INTRAVENOUS | Status: DC | PRN
  Administered 2020-01-17: 80 ug via INTRAVENOUS
  Administered 2020-01-17 (×2): 120 ug via INTRAVENOUS
  Administered 2020-01-17: 20 ug via INTRAVENOUS

## 2020-01-17 MED ORDER — FENTANYL CITRATE (PF) 250 MCG/5ML IJ SOLN
INTRAMUSCULAR | Status: DC | PRN
Start: 2020-01-17 — End: 2020-01-17
  Administered 2020-01-17 (×5): 50 ug via INTRAVENOUS

## 2020-01-17 MED ORDER — ROCURONIUM BROMIDE 10 MG/ML (PF) SYRINGE
PREFILLED_SYRINGE | INTRAVENOUS | Status: AC
  Filled 2020-01-17: qty 10

## 2020-01-17 MED ORDER — METOCLOPRAMIDE HCL 5 MG PO TABS: 5.0000 mg | ORAL_TABLET | Freq: Three times a day (TID) | ORAL | Status: DC | PRN

## 2020-01-17 MED ORDER — PROPOFOL 10 MG/ML IV BOLUS
INTRAVENOUS | Status: DC | PRN
  Administered 2020-01-17: 200 mg via INTRAVENOUS

## 2020-01-17 MED ORDER — POVIDONE-IODINE 10 % EX SWAB: 2.0000 "application " | Freq: Once | CUTANEOUS | Status: DC

## 2020-01-17 MED ORDER — ALBUMIN HUMAN 5 % IV SOLN: INTRAVENOUS | Status: DC | PRN

## 2020-01-17 MED ORDER — MIDAZOLAM HCL 2 MG/2ML IJ SOLN
INTRAMUSCULAR | Status: AC
  Filled 2020-01-17: qty 2

## 2020-01-17 MED ORDER — FLUTICASONE PROPIONATE 50 MCG/ACT NA SUSP
2.0000 | Freq: Every day | NASAL | Status: DC | PRN
  Filled 2020-01-17: qty 16

## 2020-01-17 SURGICAL SUPPLY — 63 items
ADH SKN CLS APL DERMABOND .7 (GAUZE/BANDAGES/DRESSINGS) ×2
BAG SPEC THK2 15X12 ZIP CLS (MISCELLANEOUS) ×1
BAG ZIPLOCK 12X15 (MISCELLANEOUS) ×3 IMPLANT
BLADE SAW SGTL 11.0X1.19X90.0M (BLADE) IMPLANT
CABLE CERLAGE W/CRIMP 1.8 (Cable) ×4 IMPLANT
CABLE CERLAGE W/CRIMP 1.8MM (Cable) ×4 IMPLANT
CLOSURE WOUND 1/2 X4 (GAUZE/BANDAGES/DRESSINGS)
COVER SURGICAL LIGHT HANDLE (MISCELLANEOUS) ×3 IMPLANT
COVER WAND RF STERILE (DRAPES) IMPLANT
DERMABOND ADVANCED (GAUZE/BANDAGES/DRESSINGS) ×4
DERMABOND ADVANCED .7 DNX12 (GAUZE/BANDAGES/DRESSINGS) IMPLANT
DRAPE INCISE IOBAN 66X45 STRL (DRAPES) ×3 IMPLANT
DRAPE ORTHO SPLIT 77X108 STRL (DRAPES) ×6
DRAPE POUCH INSTRU U-SHP 10X18 (DRAPES) ×3 IMPLANT
DRAPE SURG 17X11 SM STRL (DRAPES) ×3 IMPLANT
DRAPE SURG ORHT 6 SPLT 77X108 (DRAPES) ×2 IMPLANT
DRAPE U-SHAPE 47X51 STRL (DRAPES) ×3 IMPLANT
DRSG AQUACEL AG ADV 3.5X14 (GAUZE/BANDAGES/DRESSINGS) ×2 IMPLANT
DRSG EMULSION OIL 3X16 NADH (GAUZE/BANDAGES/DRESSINGS) ×3 IMPLANT
DRSG PAD ABDOMINAL 8X10 ST (GAUZE/BANDAGES/DRESSINGS) ×6 IMPLANT
DURAPREP 26ML APPLICATOR (WOUND CARE) ×3 IMPLANT
ELECT BLADE TIP CTD 4 INCH (ELECTRODE) ×3 IMPLANT
ELECT REM PT RETURN 15FT ADLT (MISCELLANEOUS) ×3 IMPLANT
EVACUATOR 1/8 PVC DRAIN (DRAIN) ×3 IMPLANT
FACESHIELD WRAPAROUND (MASK) ×12 IMPLANT
FACESHIELD WRAPAROUND OR TEAM (MASK) ×4 IMPLANT
GAUZE SPONGE 4X4 12PLY STRL (GAUZE/BANDAGES/DRESSINGS) ×6 IMPLANT
GLOVE BIOGEL M 7.0 STRL (GLOVE) IMPLANT
GLOVE BIOGEL PI IND STRL 7.5 (GLOVE) ×1 IMPLANT
GLOVE BIOGEL PI IND STRL 8.5 (GLOVE) ×1 IMPLANT
GLOVE BIOGEL PI INDICATOR 7.5 (GLOVE) ×2
GLOVE BIOGEL PI INDICATOR 8.5 (GLOVE) ×2
GLOVE ECLIPSE 8.0 STRL XLNG CF (GLOVE) IMPLANT
GLOVE ORTHO TXT STRL SZ7.5 (GLOVE) ×3 IMPLANT
GLOVE SURG ORTHO 8.0 STRL STRW (GLOVE) ×3 IMPLANT
GOWN STRL REUS W/TWL LRG LVL3 (GOWN DISPOSABLE) ×3 IMPLANT
GOWN STRL REUS W/TWL XL LVL3 (GOWN DISPOSABLE) ×6 IMPLANT
IMMOBILIZER KNEE 20 (SOFTGOODS)
IMMOBILIZER KNEE 20 THIGH 36 (SOFTGOODS) IMPLANT
KIT BASIN OR (CUSTOM PROCEDURE TRAY) ×3 IMPLANT
KIT TURNOVER KIT A (KITS) IMPLANT
MANIFOLD NEPTUNE II (INSTRUMENTS) ×3 IMPLANT
NS IRRIG 1000ML POUR BTL (IV SOLUTION) ×6 IMPLANT
PACK TOTAL JOINT (CUSTOM PROCEDURE TRAY) ×3 IMPLANT
PASSER SUT SWANSON 36MM LOOP (INSTRUMENTS) IMPLANT
PENCIL SMOKE EVACUATOR (MISCELLANEOUS) IMPLANT
PROTECTOR NERVE ULNAR (MISCELLANEOUS) ×3 IMPLANT
SET INTERPULSE LAVAGE W/TIP (ORTHOPEDIC DISPOSABLE SUPPLIES) ×2 IMPLANT
SPONGE LAP 18X18 RF (DISPOSABLE) ×3 IMPLANT
SPONGE LAP 4X18 RFD (DISPOSABLE) ×3 IMPLANT
STAPLER VISISTAT 35W (STAPLE) ×3 IMPLANT
STRIP CLOSURE SKIN 1/2X4 (GAUZE/BANDAGES/DRESSINGS) IMPLANT
SUCTION FRAZIER HANDLE 10FR (MISCELLANEOUS) ×3
SUCTION TUBE FRAZIER 10FR DISP (MISCELLANEOUS) ×1 IMPLANT
SUT ETHIBOND NAB CT1 #1 30IN (SUTURE) IMPLANT
SUT MNCRL AB 4-0 PS2 18 (SUTURE) ×2 IMPLANT
SUT STRATAFIX PDS+ 0 24IN (SUTURE) ×2 IMPLANT
SUT VIC AB 1 CT1 36 (SUTURE) ×9 IMPLANT
SUT VIC AB 2-0 CT1 27 (SUTURE) ×12
SUT VIC AB 2-0 CT1 TAPERPNT 27 (SUTURE) ×3 IMPLANT
TOWEL OR 17X26 10 PK STRL BLUE (TOWEL DISPOSABLE) ×8 IMPLANT
TRAY FOLEY MTR SLVR 16FR STAT (SET/KITS/TRAYS/PACK) ×3 IMPLANT
WATER STERILE IRR 1000ML POUR (IV SOLUTION) ×3 IMPLANT

## 2020-01-17 NOTE — Anesthesia Preprocedure Evaluation (Addendum)
Anesthesia Evaluation  Patient identified by MRN, date of birth, ID band Patient awake    Reviewed: Allergy & Precautions, NPO status , Patient's Chart, lab work & pertinent test results  History of Anesthesia Complications (+) Emergence Delirium and history of anesthetic complications  Airway Mallampati: II  TM Distance: >3 FB Neck ROM: Full    Dental  (+) Teeth Intact   Pulmonary neg pulmonary ROS,    Pulmonary exam normal breath sounds clear to auscultation       Cardiovascular Exercise Tolerance: Good negative cardio ROS Normal cardiovascular exam Rhythm:Regular Rate:Normal     Neuro/Psych  Neuromuscular disease negative psych ROS   GI/Hepatic negative GI ROS, Neg liver ROS,   Endo/Other  Obesity   Renal/GU negative Renal ROS Bladder dysfunction      Musculoskeletal  (+) Arthritis , Status post left total hip replacement with displaced great trochanter fracture   Abdominal   Peds  Hematology negative hematology ROS (+)   Anesthesia Other Findings Day of surgery medications reviewed with the patient.  Reproductive/Obstetrics                            Anesthesia Physical Anesthesia Plan  ASA: II  Anesthesia Plan: General   Post-op Pain Management:    Induction:   PONV Risk Score and Plan: 2 and Treatment may vary due to age or medical condition, Dexamethasone and Ondansetron  Airway Management Planned: Oral ETT  Additional Equipment:   Intra-op Plan:   Post-operative Plan: Extubation in OR  Informed Consent: I have reviewed the patients History and Physical, chart, labs and discussed the procedure including the risks, benefits and alternatives for the proposed anesthesia with the patient or authorized representative who has indicated his/her understanding and acceptance.       Plan Discussed with: CRNA, Anesthesiologist and Surgeon  Anesthesia Plan Comments:         Anesthesia Quick Evaluation

## 2020-01-17 NOTE — Anesthesia Postprocedure Evaluation (Signed)
Anesthesia Post Note  Patient: Jacob Mitchell  Procedure(s) Performed: OPEN REDUCTION INTERNAL FIXATION LEFT HIP GREATER TROCHANTER FRACTURE (Left Hip)     Patient location during evaluation: PACU Anesthesia Type: General Level of consciousness: awake and alert Pain management: pain level controlled Vital Signs Assessment: post-procedure vital signs reviewed and stable Respiratory status: spontaneous breathing, nonlabored ventilation, respiratory function stable and patient connected to nasal cannula oxygen Cardiovascular status: blood pressure returned to baseline and stable Postop Assessment: no apparent nausea or vomiting Anesthetic complications: no   No complications documented.  Last Vitals:  Vitals:   01/17/20 1953 01/17/20 2142  BP: 130/65 (!) 158/67  Pulse: (!) 57 81  Resp: 16 16  Temp:  (!) 36.4 C  SpO2: 99% 99%    Last Pain:  Vitals:   01/17/20 2142  TempSrc: Oral  PainSc:                  [name]

## 2020-01-17 NOTE — Interval H&P Note (Signed)
History and Physical Interval Note:  01/17/2020 11:35 AM  Jacob Mitchell  has presented today for surgery, with the diagnosis of Status post left total hip replacement with displaced great trochanter fracture.  The various methods of treatment have been discussed with the patient and family. After consideration of risks, benefits and other options for treatment, the patient has consented to  Procedure(s) with comments: OPEN REDUCTION INTERNAL FIXATION LEFT HIP GREATER TROCHANTER FRACTURE (Left) - 90 MINS as a surgical intervention.  The patient's history has been reviewed, patient examined, no change in status, stable for surgery.  I have reviewed the patient's chart and labs.  Questions were answered to the patient's satisfaction.     Mauri Pole

## 2020-01-17 NOTE — Brief Op Note (Signed)
01/17/2020  12:35 PM  PATIENT:  Jacob Mitchell  74 y.o. male  PRE-OPERATIVE DIAGNOSIS:  Status post left total hip replacement with displaced great trochanter fracture  POST-OPERATIVE DIAGNOSIS:  Status post left total hip replacement with displaced great trochanter fracture  PROCEDURE:  Procedure(s) with comments: OPEN REDUCTION INTERNAL FIXATION LEFT HIP GREATER TROCHANTER FRACTURE (Left) - 90 MINS  SURGEON:  Surgeon(s) and Role:    Paralee Cancel, MD - Primary  PHYSICIAN ASSISTANT: Danae Orleans, PA-C  ANESTHESIA:   general  EBL:  1200 cc   BLOOD ADMINISTERED:none  DRAINS: none   LOCAL MEDICATIONS USED:  NONE  SPECIMEN:  No Specimen  DISPOSITION OF SPECIMEN:  N/A  COUNTS:  YES  TOURNIQUET:  * No tourniquets in log *  DICTATION: .Other Dictation: Dictation Number [id number]  PLAN OF CARE: Admit for overnight observation  PATIENT DISPOSITION:  PACU - hemodynamically stable.   Delay start of Pharmacological VTE agent (>24hrs) due to surgical blood loss or risk of bleeding: no

## 2020-01-17 NOTE — Transfer of Care (Signed)
Immediate Anesthesia Transfer of Care Note  Patient: Jacob Mitchell  Procedure(s) Performed: OPEN REDUCTION INTERNAL FIXATION LEFT HIP GREATER TROCHANTER FRACTURE (Left Hip)  Patient Location: PACU  Anesthesia Type:General  Level of Consciousness: awake and alert   Airway & Oxygen Therapy: Patient Spontanous Breathing and Patient connected to face mask oxygen  Post-op Assessment: Report given to RN and Post -op Vital signs reviewed and stable  Post vital signs: Reviewed and stable  Last Vitals:  Vitals Value Taken Time  BP    Temp    Pulse 57 01/17/20 1632  Resp    SpO2 100 % 01/17/20 1632  Vitals shown include unvalidated device data.  Last Pain:  Vitals:   01/17/20 1206  TempSrc: Oral  PainSc:       Patients Stated Pain Goal: 3 (61/22/44 9753)  Complications: No complications documented.

## 2020-01-17 NOTE — Anesthesia Procedure Notes (Signed)
Procedure Name: Intubation Date/Time: 01/17/2020 1:52 PM Performed by: Pilar Grammes, CRNA Pre-anesthesia Checklist: Patient identified, Emergency Drugs available, Suction available, Patient being monitored and Timeout performed Patient Re-evaluated:Patient Re-evaluated prior to induction Oxygen Delivery Method: Circle system utilized Preoxygenation: Pre-oxygenation with 100% oxygen Induction Type: IV induction Ventilation: Mask ventilation without difficulty Laryngoscope Size: Miller and 3 Grade View: Grade III Tube type: Oral Tube size: 7.5 mm Number of attempts: 1 Airway Equipment and Method: Stylet Placement Confirmation: positive ETCO2,  ETT inserted through vocal cords under direct vision,  CO2 detector and breath sounds checked- equal and bilateral Secured at: 24 cm Tube secured with: Tape Dental Injury: Teeth and Oropharynx as per pre-operative assessment  Comments: 2 handed mask.  Grade 3 view.

## 2020-01-17 NOTE — Interval H&P Note (Signed)
History and Physical Interval Note:  01/17/2020 12:33 PM  Jacob Mitchell  has presented today for surgery, with the diagnosis of Status post left total hip replacement with displaced great trochanter fracture.  The various methods of treatment have been discussed with the patient and family. After consideration of risks, benefits and other options for treatment, the patient has consented to  Procedure(s) with comments: OPEN REDUCTION INTERNAL FIXATION LEFT HIP GREATER TROCHANTER FRACTURE (Left) - 90 MINS as a surgical intervention.  The patient's history has been reviewed, patient examined, no change in status, stable for surgery.  I have reviewed the patient's chart and labs.  Questions were answered to the patient's satisfaction.     Mauri Pole

## 2020-01-17 NOTE — Anesthesia Procedure Notes (Signed)
Date/Time: 01/17/2020 4:22 PM Performed by: Cynda Familia, CRNA Oxygen Delivery Method: Simple face mask Placement Confirmation: breath sounds checked- equal and bilateral and positive ETCO2 Dental Injury: Teeth and Oropharynx as per pre-operative assessment

## 2020-01-18 DIAGNOSIS — Z981 Arthrodesis status: Secondary | ICD-10-CM | POA: Diagnosis not present

## 2020-01-18 DIAGNOSIS — W010XXA Fall on same level from slipping, tripping and stumbling without subsequent striking against object, initial encounter: Secondary | ICD-10-CM | POA: Diagnosis present

## 2020-01-18 DIAGNOSIS — Z6836 Body mass index (BMI) 36.0-36.9, adult: Secondary | ICD-10-CM | POA: Diagnosis not present

## 2020-01-18 DIAGNOSIS — M9702XA Periprosthetic fracture around internal prosthetic left hip joint, initial encounter: Secondary | ICD-10-CM | POA: Diagnosis present

## 2020-01-18 DIAGNOSIS — E669 Obesity, unspecified: Secondary | ICD-10-CM | POA: Diagnosis present

## 2020-01-18 DIAGNOSIS — S72112A Displaced fracture of greater trochanter of left femur, initial encounter for closed fracture: Secondary | ICD-10-CM | POA: Diagnosis present

## 2020-01-18 LAB — CBC
HCT: 29.4 % — ABNORMAL LOW (ref 39.0–52.0)
Hemoglobin: 9.2 g/dL — ABNORMAL LOW (ref 13.0–17.0)
MCH: 29.1 pg (ref 26.0–34.0)
MCHC: 31.3 g/dL (ref 30.0–36.0)
MCV: 93 fL (ref 80.0–100.0)
Platelets: 277 10*3/uL (ref 150–400)
RBC: 3.16 MIL/uL — ABNORMAL LOW (ref 4.22–5.81)
RDW: 13.5 % (ref 11.5–15.5)
WBC: 9.2 10*3/uL (ref 4.0–10.5)
nRBC: 0 % (ref 0.0–0.2)

## 2020-01-18 LAB — BASIC METABOLIC PANEL
Anion gap: 10 (ref 5–15)
BUN: 24 mg/dL — ABNORMAL HIGH (ref 8–23)
CO2: 21 mmol/L — ABNORMAL LOW (ref 22–32)
Calcium: 8.1 mg/dL — ABNORMAL LOW (ref 8.9–10.3)
Chloride: 103 mmol/L (ref 98–111)
Creatinine, Ser: 1.06 mg/dL (ref 0.61–1.24)
GFR, Estimated: >60 mL/min (ref >60)
Glucose, Bld: 148 mg/dL — ABNORMAL HIGH (ref 70–99)
Potassium: 4.5 mmol/L (ref 3.5–5.1)
Sodium: 134 mmol/L — ABNORMAL LOW (ref 135–145)

## 2020-01-18 MED ORDER — DOCUSATE SODIUM 100 MG PO CAPS: 100.0000 mg | ORAL_CAPSULE | Freq: Two times a day (BID) | ORAL | 0 refills | Status: DC

## 2020-01-18 MED ORDER — METHOCARBAMOL 500 MG PO TABS: 500.0000 mg | ORAL_TABLET | Freq: Four times a day (QID) | ORAL | 0 refills | Status: DC | PRN

## 2020-01-18 MED ORDER — HYDROCODONE-ACETAMINOPHEN 5-325 MG PO TABS
1.0000 | ORAL_TABLET | Freq: Four times a day (QID) | ORAL | 0 refills | Status: DC | PRN
Start: 2020-01-18 — End: 2020-02-29

## 2020-01-18 MED ORDER — FERROUS SULFATE 325 (65 FE) MG PO TABS: 325.0000 mg | ORAL_TABLET | Freq: Three times a day (TID) | ORAL | 0 refills | Status: DC

## 2020-01-18 MED ORDER — ASPIRIN 81 MG PO CHEW: 81.0000 mg | CHEWABLE_TABLET | Freq: Two times a day (BID) | ORAL | 0 refills | Status: AC

## 2020-01-18 MED ORDER — POLYETHYLENE GLYCOL 3350 17 G PO PACK: 17.0000 g | PACK | Freq: Two times a day (BID) | ORAL | 0 refills | Status: DC

## 2020-01-18 NOTE — TOC Transition Note (Signed)
Transition of Care [facility]) - CM/SW Discharge Note   Patient Details  Name: Jacob Mitchell MRN: [id number] Date of Birth: January 13, 1946  Transition of [facility] CM/SW Contact:  Lia Hopping, [place] Phone Number: 01/18/2020, 12:16 PM   Clinical Narrative:    Per patient, therapy plan HEP Patient confirm he has a RW and request a 3 in1.  Wide 3 in1 ordered and delivered to the patient bedside.    Final next level of care: Home/Self Care (HEP) Barriers to Discharge: Barriers Resolved   Patient Goals and CMS Choice        Discharge Placement                       Discharge Plan and Services                DME Arranged: 3-N-1 DME Agency: [employer] Date DME Agency Contacted: 01/18/20 Time DME Agency Contacted: [id number] Representative spoke with at DME Agency: [place] Interventions     Readmission Risk Interventions No flowsheet data found.

## 2020-01-18 NOTE — Op Note (Signed)
NAME: Jacob Mitchell, Jacob Mitchell MEDICAL RECORD IO:[id number] ACCOUNT [id number] DATE OF BIRTH:03-27-1945 FACILITY: [facility]ATION: [id number] PHYSICIAN:Maddie Brazier D. Alvan Dame, MD  OPERATIVE REPORT  DATE OF PROCEDURE:  01/17/2020  PREOPERATIVE DIAGNOSIS:  Left displaced periprosthetic greater trochanteric femur fracture with stable femoral component.  POSTOPERATIVE DIAGNOSIS:  Left displaced periprosthetic greater trochanteric femur fracture with stable femoral component.  PROCEDURE:  Open reduction internal fixation of left displaced greater trochanteric femur fracture utilizing 4 cables.  SURGEON:  Paralee Cancel, MD  ASSISTANT:  Danae Orleans PA-C  Note that Mr. Guinevere Scarlet was present for the entirety of the case from preoperative positioning, perioperative management of the operative extremity, assistance with reduction and maintenance of reduction as well as for fixation of cables as well as primary  wound closure, and general facilitation of the case.  ANESTHESIA:  General.  BLOOD LOSS:  About 1200 mL related to significant generalized oozing of all soft tissues.  DRAINS:  None.  COMPLICATIONS:  None apparent.  INDICATIONS FOR PROCEDURE:  The patient is a very pleasant 74 year old male with a complex history involving his left hip.  This included a primary total hip arthroplasty requiring revision and then recently having to undergo revision surgery for  recurrent instability of the left hip.  He was in his normal state of health, recovering from the surgery when about the latter half of last week, he got up in the night and then stumbled and slammed into the wall and then directly down on to his left  hip.  He had immediate onset of pain and was initially taken to the emergency room.  He was then seen in our office.  Radiographs revealed a long oblique fracture involving the greater trochanter, but did have a stable femoral prosthesis.  I felt that it  was indicated to repair this due to concerns for  displacement as well as his history of instability.  We reviewed the risks of malunion, nonunion, need for future surgeries as well as the concern for potential instability given his history.  Standard  risk of infection and DVT discussed as well.  Consent was obtained for benefit of pain relief and functionality.  PROCEDURE IN DETAIL:  The patient was brought to the operative theater.  Once adequate anesthesia, preoperative antibiotics administered as well as tranexamic acid, he was positioned into the right lateral decubitus position with left hip up.  The left  lower extremity was then prepped and draped in sterile fashion.  A timeout was performed identifying the patient, the planned procedure, and extremity.  At this point, we had marked his old posterior incision from his most recent revision.  I extended  this distally with the intention of anatomically trying to reduce his fracture, but also with a plan to use a cable claw plate.  An incision was made.  Soft tissue dissection was carried down to the iliotibial band and gluteal fascia, which were then  split  to aid in exposure.  Again, we noted significant oozing from all soft tissues.  He had significant ecchymosis from this injury.  Once I then incised his iliotibial band and gluteal fascia, we encountered a very large seroma inside the hip.  This  was fully evacuated.  There was damage noted to the vastus lateralis as well as partially of the gluteus in this area based on the injury.  At this point, I exposed the lateral aspect of his hip by taking the vastus lateralis off the femur and reflected  this anteriorly.  I was  able to identify the fracture at this point.  Again, this was a long oblique fracture.  However, the pattern of it appeared to be involving an avulsion type shearing of the anterior third of the greater trochanter extending into  the anterior aspect of the femur as opposed to a direct lateral displaced fracture.  I evacuated  hematoma and soft tissues from the fracture site.  We then used bone holding forceps and reduction tools to figure out the best way to anatomically reduce  this.  At this point, I felt that the claw plate would not work based on the orientation of the fracture and thus cabling would be the only means of fixation.  At this point, I exposed the lateral aspect of the femur and was able to pass cables.  I  passed 4 cables, three distal to the lesser trochanter and one proximal to the lesser trochanter.  The fracture was reduced in an anatomic position under direct visualization.  All cables were crimped appropriately and then tensioned and cut.  Once this  was done, I irrigated the wound with normal saline solution, approximately 1 liter.  I then reapproximated the iliotibial band over the vastus lateralis and then extended this onto the gluteal fascia using a combination of #1 Vicryl and #1 Stratafix  suture.  The remainder of the wound was closed with 2-0 Vicryl and a running Monocryl stitch.  The hip was then cleaned, dried and dressed sterilely using the Aquacel dressing.  He was then brought to the recovery room in stable condition, tolerating the  procedure well.  Postoperatively, we will have him partially limit his weightbearing with no active abduction for up to 4-6 weeks as much as possible to try to allow for this to heal.  We will see him back in the office in 2 weeks from discharge.   Discharge will be dependent on his functional activity with therapy and stability medically.  HN/NUANCE  D:01/17/2020 T:01/18/2020 JOB:[id number]

## 2020-01-18 NOTE — Progress Notes (Signed)
1610 patient hs not voided after foley removal, bladder scanned for [place] notified. Bethann Punches RN

## 2020-01-18 NOTE — Progress Notes (Signed)
1700 patient voided 75cc, M [place] notified, discharge cancelled, continue to have patient hydrate and check bladder scan. Bethann Punches RN

## 2020-01-18 NOTE — Evaluation (Signed)
Physical Therapy One Time Evaluation Patient Details Name: Jacob Mitchell MRN: [id number] DOB: 12-14-1945 Today's Date: 01/18/2020   History of Present Illness  Pt is a 74 year old male s/p Open reduction internal fixation of left displaced greater trochanteric femur fracture with hx of L THA and posterior left hip revision  Clinical Impression  Patient evaluated by Physical Therapy with no further acute PT needs identified. All education has been completed and the patient has no further questions. Pt educated on PWB using RW and aware for no active left hip abduction.  Pt ambulated in hallway with RW and eager for d/c home today.  Pt's spouse present during session. Pt reports he has no further questions and feels ready for d/c home. See below for any follow-up Physical Therapy or equipment needs. PT is signing off. Thank you for this referral.     Follow Up Recommendations Follow surgeon's recommendation for DC plan and follow-up therapies;Outpatient PT    Equipment Recommendations  3in1 (PT)    Recommendations for Other Services       Precautions / Restrictions Precautions Precautions: Fall Precaution Comments: no active hip abduction Restrictions Weight Bearing Restrictions: Yes LLE Weight Bearing: Partial weight bearing LLE Partial Weight Bearing Percentage or Pounds: "partially limit" in op note      Mobility  Bed Mobility               General bed mobility comments: pt sitting EOB on arrival    Transfers Overall transfer level: Needs assistance Equipment used: Rolling walker (2 wheeled) Transfers: Sit to/from Stand Sit to Stand: Min guard;From elevated surface         General transfer comment: increased time and effort, cues for technique  Ambulation/Gait Ambulation/Gait assistance: Min guard Gait Distance (Feet): 100 Feet Assistive device: Rolling walker (2 wheeled) Gait Pattern/deviations: Step-to pattern;Decreased stance time - left;Antalgic      General Gait Details: verbal cues for limiting WBing using RW, pt tends to rest forearms on RW, reports hx of back issues, encouraged upright posture  Stairs            Wheelchair Mobility    Modified Rankin (Stroke Patients Only)       Balance                                             Pertinent Vitals/Pain Pain Assessment: 0-10 Pain Score: 6  Pain Location: left hip Pain Descriptors / Indicators: Aching;Sore;Grimacing Pain Intervention(s): Repositioned;Monitored during session    Home Living Family/patient expects to be discharged to:: Private residence Living Arrangements: Spouse/significant other Available Help at Discharge: Family Type of Home: House Home Access: Level entry     Home Layout: One level Home Equipment: Environmental consultant - 2 wheels;Cane - single point      Prior Function Level of Independence: Independent               Hand Dominance        Extremity/Trunk Assessment        Lower Extremity Assessment Lower Extremity Assessment: LLE deficits/detail LLE Deficits / Details: anticipated post op weakness, able to perform ankle pumps       Communication   Communication: No difficulties  Cognition Arousal/Alertness: Awake/alert Behavior During Therapy: WFL for tasks assessed/performed Overall Cognitive Status: Within Functional Limits for tasks assessed  General Comments      Exercises     Assessment/Plan    PT Assessment All further PT needs can be met in the next venue of care  PT Problem List Decreased knowledge of use of DME;Decreased strength;Decreased mobility;Decreased knowledge of precautions;Decreased activity tolerance;Decreased balance       PT Treatment Interventions      PT Goals (Current goals can be found in the Care Plan section)  Acute Rehab PT Goals PT Goal Formulation: All assessment and education complete, DC therapy    Frequency      Barriers to discharge        Co-evaluation               AM-PAC PT "6 Clicks" Mobility  Outcome Measure Help needed turning from your back to your side while in a flat bed without using bedrails?: A Little Help needed moving from lying on your back to sitting on the side of a flat bed without using bedrails?: A Little Help needed moving to and from a bed to a chair (including a wheelchair)?: A Little Help needed standing up from a chair using your arms (e.g., wheelchair or bedside chair)?: A Little Help needed to walk in hospital room?: A Little Help needed climbing 3-5 steps with a railing? : A Lot 6 Click Score: 17    End of Session Equipment Utilized During Treatment: Gait belt Activity Tolerance: Patient tolerated treatment well Patient left: in chair;with call bell/phone within reach;with family/visitor present;with nursing/sitter in room Nurse Communication: Mobility status PT Visit Diagnosis: Other abnormalities of gait and mobility (R26.89)    Time: 3967-2897 PT Time Calculation (min) (ACUTE ONLY): 21 min   Charges:   PT Evaluation $PT Eval Low Complexity: 1 Low        Kati PT, DPT Acute Rehabilitation Services Pager: [phone] Office: [phone]  York Ram E 01/18/2020, 3:26 PM

## 2020-01-18 NOTE — Progress Notes (Signed)
     Subjective: 1 Day Post-Op Procedure(s) (LRB): OPEN REDUCTION INTERNAL FIXATION LEFT HIP GREATER TROCHANTER FRACTURE (Left)   Patient reports pain as none, states that he really isn't having any pain.  No reported events throughout the night.  Discussed the procedure, findings and expectations moving forward.  Discussed no active abduction. Ready to be discharged home, if they do well with PT.  Follow up in the clinic in 2 weeks.  Knows to call with any questions or concerns.       Objective:   VITALS:   Vitals:   01/18/20 0154 01/18/20 0559  BP: (!) 124/56 118/60  Pulse: 70 70  Resp: 18 18  Temp: 98 F (36.7 C) 98.3 F (36.8 C)  SpO2: 97%     Dorsiflexion/Plantar flexion intact Incision: dressing C/D/I No cellulitis present Compartment soft  LABS Recent Labs    01/17/20 1220 01/17/20 1220 01/17/20 1456 01/17/20 1537 01/18/20 0328  HGB 11.5*   < > 10.5* 10.9* 9.2*  HCT 36.0*   < > 31.0* 32.0* 29.4*  WBC 8.4  --   --   --  9.2  PLT 299  --   --   --  277   < > = values in this interval not displayed.    Recent Labs    01/17/20 1456 01/17/20 1537 01/18/20 0328  NA 138 138 134*  K 4.2 4.2 4.5  BUN 25* 26* 24*  CREATININE 1.00 1.00 1.06  GLUCOSE 108* 112* 148*     Assessment/Plan: 1 Day Post-Op Procedure(s) (LRB): OPEN REDUCTION INTERNAL FIXATION LEFT HIP GREATER TROCHANTER FRACTURE (Left)  Foley cath d/c'ed Advance diet Up with therapy D/C IV fluids Discharge home Follow up in 2 weeks at [facility] Follow up with OLIN,Dylann Layne D in 2 weeks.  Contact information:  [facility] [address] [place] [phone]             Danae Orleans PA-C  [facility]  [place] [address] [place], [place], [place] [address] Phone: [phone] [email or web address] Facebook  [employer]

## 2020-01-19 ENCOUNTER — Encounter (HOSPITAL_COMMUNITY): Payer: Self-pay | Admitting: Orthopedic Surgery

## 2020-01-19 LAB — BASIC METABOLIC PANEL
Anion gap: 7 (ref 5–15)
BUN: 35 mg/dL — ABNORMAL HIGH (ref 8–23)
CO2: 24 mmol/L (ref 22–32)
Calcium: 8.1 mg/dL — ABNORMAL LOW (ref 8.9–10.3)
Chloride: 104 mmol/L (ref 98–111)
Creatinine, Ser: 1.14 mg/dL (ref 0.61–1.24)
GFR, Estimated: >60 mL/min (ref >60)
Glucose, Bld: 104 mg/dL — ABNORMAL HIGH (ref 70–99)
Potassium: 3.9 mmol/L (ref 3.5–5.1)
Sodium: 135 mmol/L (ref 135–145)

## 2020-01-19 LAB — CBC
HCT: 26.5 % — ABNORMAL LOW (ref 39.0–52.0)
Hemoglobin: 8.4 g/dL — ABNORMAL LOW (ref 13.0–17.0)
MCH: 29.3 pg (ref 26.0–34.0)
MCHC: 31.7 g/dL (ref 30.0–36.0)
MCV: 92.3 fL (ref 80.0–100.0)
Platelets: 230 10*3/uL (ref 150–400)
RBC: 2.87 MIL/uL — ABNORMAL LOW (ref 4.22–5.81)
RDW: 13.8 % (ref 11.5–15.5)
WBC: 7.3 10*3/uL (ref 4.0–10.5)
nRBC: 0 % (ref 0.0–0.2)

## 2020-01-19 NOTE — Plan of Care (Signed)
  Problem: Health Behavior/Discharge Planning: Goal: Ability to manage health-related needs will improve Outcome: Adequate for Discharge   Problem: Clinical Measurements: Goal: Ability to maintain clinical measurements within normal limits will improve Outcome: Adequate for Discharge Goal: Will remain free from infection Outcome: Adequate for Discharge Goal: Diagnostic test results will improve Outcome: Adequate for Discharge Goal: Respiratory complications will improve Outcome: Adequate for Discharge Goal: Cardiovascular complication will be avoided Outcome: Adequate for Discharge   Problem: Activity: Goal: Risk for activity intolerance will decrease Outcome: Adequate for Discharge   Problem: Nutrition: Goal: Adequate nutrition will be maintained Outcome: Adequate for Discharge   Problem: Coping: Goal: Level of anxiety will decrease Outcome: Adequate for Discharge   Problem: Elimination: Goal: Will not experience complications related to bowel motility Outcome: Adequate for Discharge Goal: Will not experience complications related to urinary retention Outcome: Adequate for Discharge   Problem: Pain Managment: Goal: General experience of comfort will improve Outcome: Adequate for Discharge   Problem: Safety: Goal: Ability to remain free from injury will improve Outcome: Adequate for Discharge   Problem: Skin Integrity: Goal: Risk for impaired skin integrity will decrease Outcome: Adequate for Discharge   Problem: Education: Goal: Verbalization of understanding the information provided (i.e., activity precautions, restrictions, etc) will improve Outcome: Adequate for Discharge   Problem: Activity: Goal: Ability to ambulate and perform ADLs will improve Outcome: Adequate for Discharge   Problem: Clinical Measurements: Goal: Postoperative complications will be avoided or minimized Outcome: Adequate for Discharge   Problem: Self-Concept: Goal: Ability to  maintain and perform role responsibilities to the fullest extent possible will improve Outcome: Adequate for Discharge   Problem: Pain Management: Goal: Pain level will decrease Outcome: Adequate for Discharge

## 2020-01-19 NOTE — Progress Notes (Signed)
   Subjective: 2 Days Post-Op Procedure(s) (LRB): OPEN REDUCTION INTERNAL FIXATION LEFT HIP GREATER TROCHANTER FRACTURE (Left) Patient reports pain as mild.   Patient seen in rounds with Dr. Alvan Dame. Patient is well, and has had no acute complaints or problems. No acute events overnight. Voiding without difficulty, positive flatus. Ambulated 100 feet with PT yesterday. Denies CP, SHOB, N/V.  We will continue therapy today.   Objective: Vital signs in last 24 hours: Temp:  [98.4 F (36.9 C)-98.7 F (37.1 C)] 98.7 F (37.1 C) (12/09 0526) Pulse Rate:  [71-79] 79 (12/09 0526) Resp:  [18] 18 (12/09 0526) BP: (142-147)/(69-78) 147/69 (12/09 0526) SpO2:  [98 %] 98 % (12/09 0526)  Intake/Output from previous day:  Intake/Output Summary (Last 24 hours) at 01/19/2020 1014 Last data filed at 01/19/2020 0900 Gross per 24 hour  Intake 1835.79 ml  Output 875 ml  Net 960.79 ml     Intake/Output this shift: Total I/O In: 300 [P.O.:300] Out: 150 [Urine:150]  Labs: Recent Labs    01/17/20 1220 01/17/20 1456 01/17/20 1537 01/18/20 0328 01/19/20 0330  HGB 11.5* 10.5* 10.9* 9.2* 8.4*   Recent Labs    01/18/20 0328 01/19/20 0330  WBC 9.2 7.3  RBC 3.16* 2.87*  HCT 29.4* 26.5*  PLT 277 230   Recent Labs    01/18/20 0328 01/19/20 0330  NA 134* 135  K 4.5 3.9  CL 103 104  CO2 21* 24  BUN 24* 35*  CREATININE 1.06 1.14  GLUCOSE 148* 104*  CALCIUM 8.1* 8.1*   Recent Labs    01/17/20 1220  INR 1.1    Exam: General - Patient is Alert and Oriented Extremity - Neurologically intact Sensation intact distally Intact pulses distally Dorsiflexion/Plantar flexion intact Dressing - dressing C/D/I Motor Function - intact, moving foot and toes well on exam.   Past Medical History:  Diagnosis Date  . Benign localized prostatic hyperplasia with lower urinary tract symptoms (LUTS)   . Complication of anesthesia    pt says he gets "restless" when he is waking up and nurses have  had to "hold him down"  . Diverticulosis of colon   . History of colon polyps   . History of kidney stones   . History of MRSA infection 2007   post right shoulder surgery  . History of urinary retention 05/31/2017   due to BPH  . Incomplete emptying of bladder   . OA (osteoarthritis)   . Peripheral neuropathy    feet-- hx frost bite  . Pinched nerve in neck    right index and middle fingers numbness  . Wears glasses   . Wears hearing aid in both ears     Assessment/Plan: 2 Days Post-Op Procedure(s) (LRB): OPEN REDUCTION INTERNAL FIXATION LEFT HIP GREATER TROCHANTER FRACTURE (Left) Active Problems:   Greater trochanter fracture (HCC)  Estimated body mass index is 36.11 kg/m as calculated from the following:   Height as of this encounter: 6\' 6"  (1.981 m).   Weight as of this encounter: 141.7 kg. Advance diet Up with therapy D/C IV fluids  DVT Prophylaxis - Aspirin Weight bearing as tolerated. Discussed that he should avoid active abduction.  Plan is to go Home after hospital stay. Plan for discharge home today. Follow up in the office in 2 weeks.   Griffith Citron, PA-C Orthopedic Surgery [phone] 01/19/2020, 10:14 AM

## 2020-01-20 DIAGNOSIS — H2511 Age-related nuclear cataract, right eye: Secondary | ICD-10-CM | POA: Diagnosis not present

## 2020-01-20 NOTE — Discharge Summary (Signed)
Patient ID: Jacob Mitchell MRN: [id number] DOB/AGE: 08/01/1945 74 y.o.  Admit date: 01/17/2020 Discharge date: 01/20/2020  Admission Diagnoses:  Active Problems:   Greater trochanter fracture [facility])   Discharge Diagnoses:  Same  Past Medical History:  Diagnosis Date  . Benign localized prostatic hyperplasia with lower urinary tract symptoms (LUTS)   . Complication of anesthesia    pt says he gets "restless" when he is waking up and nurses have had to "hold him down"  . Diverticulosis of colon   . History of colon polyps   . History of kidney stones   . History of MRSA infection 2007   post right shoulder surgery  . History of urinary retention 05/31/2017   due to BPH  . Incomplete emptying of bladder   . OA (osteoarthritis)   . Peripheral neuropathy    feet-- hx frost bite  . Pinched nerve in neck    right index and middle fingers numbness  . Wears glasses   . Wears hearing aid in both ears     Surgeries: Procedure(s): OPEN REDUCTION INTERNAL FIXATION LEFT HIP GREATER TROCHANTER FRACTURE on 01/17/2020   Consultants: N/A  Discharged Condition: Improved  Hospital Course: Jacob Mitchell is an 74 y.o. male who was admitted 01/17/2020 for operative treatment of left greater troch fracture. Patient has severe unremitting pain that affects sleep, daily activities, and work/hobbies. After pre-op clearance the patient was taken to the operating room on 01/17/2020 and underwent  Procedure(s): OPEN REDUCTION INTERNAL FIXATION LEFT HIP GREATER TROCHANTER FRACTURE.    Patient was given perioperative antibiotics:  Anti-infectives (From admission, onward)   Start     Dose/Rate Route Frequency Ordered Stop   01/17/20 2000  ceFAZolin (ANCEF) IVPB 2g/100 mL premix        2 g 200 mL/hr over 30 Minutes Intravenous Every 6 hours 01/17/20 1729 01/18/20 0245   01/17/20 0600  ceFAZolin (ANCEF) 3 g in dextrose 5 % 50 mL IVPB        3 g 100 mL/hr over 30 Minutes Intravenous On call to O.R.  01/16/20 0830 01/17/20 1432       Patient was given sequential compression devices, early ambulation, and chemoprophylaxis to prevent DVT.  Patient benefited maximally from hospital stay and there were no complications.    Recent vital signs: No data found.   Recent laboratory studies:  Recent Labs    01/18/20 0328 01/19/20 0330  WBC 9.2 7.3  HGB 9.2* 8.4*  HCT 29.4* 26.5*  PLT 277 230  NA 134* 135  K 4.5 3.9  CL 103 104  CO2 21* 24  BUN 24* 35*  CREATININE 1.06 1.14  GLUCOSE 148* 104*  CALCIUM 8.1* 8.1*     Discharge Medications:   Allergies as of 01/19/2020      Reactions   Aspartame And Phenylalanine Palpitations   Artificial sweetener- hrt 140  went to ED      Medication List    TAKE these medications   aspirin 81 MG chewable tablet Commonly known as: Aspirin Childrens Chew 1 tablet (81 mg total) by mouth 2 (two) times daily. Take for 4 weeks, then resume regular dose.   azithromycin 250 MG tablet Commonly known as: Zithromax Z-Pak Take 1 tablet (250 mg total) by mouth daily. Take 2tabs on first day, then 1tab once a day till complete   betaxolol 0.25 % ophthalmic suspension Commonly known as: BETOPTIC-S Place 1 drop into the left eye daily.   docusate sodium 100  MG capsule Commonly known as: Colace Take 1 capsule (100 mg total) by mouth 2 (two) times daily. What changed: when to take this   ferrous sulfate 325 (65 FE) MG tablet Commonly known as: FerrouSul Take 1 tablet (325 mg total) by mouth 3 (three) times daily with meals for 14 days.   fluticasone 50 MCG/ACT nasal spray Commonly known as: FLONASE Place 2 sprays into both nostrils daily. What changed:   when to take this  reasons to take this   guaiFENesin-codeine 100-10 MG/5ML syrup Commonly known as: ROBITUSSIN AC Take 5 mLs by mouth 3 (three) times daily as needed for cough.   HYDROcodone-acetaminophen 5-325 MG tablet Commonly known as: Norco Take 1-2 tablets by mouth every 6  (six) hours as needed for moderate pain or severe pain.   methocarbamol 500 MG tablet Commonly known as: Robaxin Take 1 tablet (500 mg total) by mouth every 6 (six) hours as needed for muscle spasms.   polyethylene glycol 17 g packet Commonly known as: MIRALAX / GLYCOLAX Take 17 g by mouth 2 (two) times daily.   prednisoLONE acetate 1 % ophthalmic suspension Commonly known as: PRED FORTE Place 1 drop into the left eye 4 (four) times daily.   traZODone 100 MG tablet Commonly known as: DESYREL TAKE ONE TABLET BY MOUTH EVERY NIGHT AT BEDTIME AS NEEDED FOR SLEEP What changed:   how much to take  how to take this  when to take this  reasons to take this  additional instructions   vitamin C 1000 MG tablet Take 1,000 mg by mouth daily.   VITAMIN D3 PO Take 1 tablet by mouth daily.   ZINC PO Take 1 tablet by mouth daily.       Diagnostic Studies: DG Pelvis Portable  Result Date: 01/17/2020 CLINICAL DATA:  Postop hip replacement EXAM: PORTABLE PELVIS 1-2 VIEWS COMPARISON:  01/06/2020, MRI 01/07/2020 FINDINGS: Status post left hip replacement. There is normal alignment. Interval cerclage fixation about the proximal femur and prosthetic for periprosthetic fracture. There is anatomic alignment. Gas within the soft tissues consistent with recent surgery. IMPRESSION: Left hip replacement with interval cerclage wire fixation of the proximal left femur for periprosthetic fracture. Electronically Signed   By: Donavan Foil M.D.   On: 01/17/2020 17:46   MR FEMUR LEFT WO CONTRAST  Result Date: 01/07/2020 CLINICAL DATA:  Femur fracture, prior hip replacements. EXAM: MR OF THE LEFT FEMUR WITHOUT CONTRAST TECHNIQUE: Multiplanar, multisequence MR imaging of the left femur was performed. No intravenous contrast was administered. COMPARISON:  Radiographs from 01/06/2020 FINDINGS: Bones/Joint/Cartilage Distortion from metal artifact noted. Acute periprosthetic hip fracture with the stem of the  femoral prosthesis component primarily associated with the distal fracture fragment, and with the anterior fragment containing the greater trochanter and anterior portions of the metaphysis and proximal diaphysis. Bilateral knee joint effusions with synovitis, right greater than left. Small degenerative subcortical cysts in the contralateral (right) femoral head. Probable enchondroma in the right greater trochanter. Ligaments N/A Muscles and Tendons Prominent edema in the vastus intermedius and vastus lateralis muscle with lesser edema laterally in the vastus medialis, probably mostly related to the fracture. There is some edema along the distal hip adductor musculature. Complex fluid collection with fat fluid level noted extending from the fracture posterolaterally between the gluteus maximus and gluteus medius muscles measuring about 11.3 by 6.0 by 8.9 cm (volume = 320 cm^3). Atrophic semitendinosus muscle. Soft tissues Complex iliopsoas bursitis on the left probably containing blood products. Postoperative findings in  the posterolateral subcutaneous tissues along the hip. IMPRESSION: 1. Acute periprosthetic hip fracture with the stem of the femoral prosthesis component, and with the anterior fragment containing the greater trochanter and anterior portions of the metaphysis and proximal diaphysis. 2. Complex fluid collection with fat fluid level noted extending from the fracture posterolaterally between the gluteus maximus and gluteus medius muscles measuring about 320 cubic cm. Likely combination of hematoma and postoperative fluid. 3. Complex iliopsoas bursitis on the left probably containing blood products. 4. Bilateral knee joint effusions with synovitis, right greater than left. 5. Atrophic semitendinosus muscle. Prominent edema in the vastus intermedius and vastus lateralis muscles with lesser edema in the vastus medialis and left hip adductor musculature. Electronically Signed   By: Van Clines M.D.    On: 01/07/2020 09:08   DG Hip Unilat W or Wo Pelvis 1 View Left  Result Date: 01/06/2020 CLINICAL DATA:  Fall, left hip pain, trouble walking. EXAM: DG HIP (WITH OR WITHOUT PELVIS) 1V*L* COMPARISON:  X-ray left hip 09/06/2018 FINDINGS: Status post left hip arthroplasty with constrained ring. There is no evidence of hip fracture or dislocation of the left hip. Frontal view of the pelvis and right hip are grossly unremarkable. Surgical hardware overlying lumbosacral spine noted. There is no evidence of severe arthropathy or other focal bone abnormality. IMPRESSION: No acute displaced fracture or dislocation in a patient status post left hip arthroplasty. Electronically Signed   By: Iven Finn M.D.   On: 01/06/2020 19:20   DG Femur Min 2 Views Left  Result Date: 01/06/2020 CLINICAL DATA:  [place] onto artificial left hip EXAM: LEFT FEMUR 2 VIEWS COMPARISON:  01/06/2020, 09/06/2018 FINDINGS: Left hip replacement with normal alignment. No acute displaced fracture or malalignment. IMPRESSION: Left hip replacement without acute osseous abnormality. Electronically Signed   By: Donavan Foil M.D.   On: 01/06/2020 20:29    Disposition: Home  Discharge Instructions    Call MD / Call 911   Complete by: As directed    If you experience chest pain or shortness of breath, CALL 911 and be transported to the hospital emergency room.  If you develope a fever above 101 F, pus (white drainage) or increased drainage or redness at the wound, or calf pain, call your surgeon's office.   Constipation Prevention   Complete by: As directed    Drink plenty of fluids.  Prune juice may be helpful.  You may use a stool softener, such as Colace (over the counter) 100 mg twice a day.  Use MiraLax (over the counter) for constipation as needed.   Diet - low sodium heart healthy   Complete by: As directed    Discharge instructions   Complete by: As directed    Maintain surgical dressing until follow up in the clinic. If  the edges start to pull up, may reinforce with tape. If the dressing is no longer working, may remove and cover with gauze and tape, but must keep the area dry and clean.  Follow up in 2 weeks at [facility]. Call with any questions or concerns.   Increase activity slowly as tolerated   Complete by: As directed    Weight bearing as tolerated with assist device (walker, cane, etc) as directed, use it as long as suggested by your surgeon or therapist, typically at least 4-6 weeks.  No active abduction of the leg.       Follow-up Information    Paralee Cancel, MD. Schedule an appointment as soon as possible for  a visit in 2 weeks.   Specialty: Orthopedic Surgery Contact information: [address] [place] [address] [phone]                Signed: Lucille Passy [facility] 01/20/2020, 12:39 PM

## 2020-01-26 DIAGNOSIS — H2511 Age-related nuclear cataract, right eye: Secondary | ICD-10-CM | POA: Diagnosis not present

## 2020-01-26 DIAGNOSIS — E669 Obesity, unspecified: Secondary | ICD-10-CM | POA: Diagnosis not present

## 2020-02-01 DIAGNOSIS — M9702XS Periprosthetic fracture around internal prosthetic left hip joint, sequela: Secondary | ICD-10-CM | POA: Diagnosis not present

## 2020-02-01 DIAGNOSIS — Z96642 Presence of left artificial hip joint: Secondary | ICD-10-CM | POA: Diagnosis not present

## 2020-02-01 DIAGNOSIS — Z4789 Encounter for other orthopedic aftercare: Secondary | ICD-10-CM | POA: Diagnosis not present

## 2020-02-06 NOTE — Progress Notes (Signed)
Pt. Needs orders for upcomming surgery.PAT and labs on 02/07/20.Thanks

## 2020-02-06 NOTE — Patient Instructions (Addendum)
DUE TO COVID-19 ONLY ONE VISITOR IS ALLOWED TO COME WITH YOU AND STAY IN THE WAITING ROOM ONLY DURING PRE OP AND PROCEDURE DAY OF SURGERY. THE 1 VISITOR  MAY VISIT WITH YOU AFTER SURGERY IN YOUR PRIVATE ROOM DURING VISITING HOURS ONLY!  YOU NEED TO HAVE A COVID 19 TEST ON: 02/13/20 @ 9:30 AM , THIS TEST MUST BE DONE BEFORE SURGERY,  COVID TESTING SITE [place] [address], IT IS ON THE RIGHT GOING OUT [address] APPROXIMATELY  2 MINUTES PAST [employer] ON THE RIGHT. ONCE YOUR COVID TEST IS COMPLETED,  PLEASE BEGIN THE QUARANTINE INSTRUCTIONS AS OUTLINED IN YOUR HANDOUT.                Jacob Mitchell   Your procedure is scheduled on: 02/14/20   Report to [facility] Main  Entrance   Report to admitting at: 11:50 AM     Call this number if you have problems the morning of surgery [phone]    Remember: Do not eat solid food :After Midnight. Clear l;iquids until: 11:20 am.  BRUSH YOUR TEETH MORNING OF SURGERY AND RINSE YOUR MOUTH OUT, NO CHEWING GUM CANDY OR MINTS.   CLEAR LIQUID DIET   Foods Allowed                                                                     Foods Excluded  Coffee and tea, regular and decaf                             liquids that you cannot  Plain Jell-O any favor except red or purple                                           see through such as: Fruit ices (not with fruit pulp)                                     milk, soups, orange juice  Iced Popsicles                                    All solid food Carbonated beverages, regular and diet                                    Cranberry, grape and apple juices Sports drinks like Gatorade Lightly seasoned clear broth or consume(fat free) Sugar, honey syrup  Sample Menu Breakfast                                Lunch  Supper Cranberry juice                    Beef broth                            Chicken broth Jell-O                                      Grape juice                           Apple juice Coffee or tea                        Jell-O                                      Popsicle                                                Coffee or tea                        Coffee or tea  _____________________________________________________________________   Take these medicines the morning of surgery with A SIP OF WATER: N/A. Use eye drops and Flonase as usual.                               You may not have any metal on your body including hair pins and              piercings  Do not wear jewelry, lotions, powders or perfumes, deodoran             Men may shave face and neck.   Do not bring valuables to the hospital. [place].  Contacts, dentures or bridgework may not be worn into surgery.  Leave suitcase in the car. After surgery it may be brought to your room.     Patients discharged the day of surgery will not be allowed to drive home. IF YOU ARE HAVING SURGERY AND GOING HOME THE SAME DAY, YOU MUST HAVE AN ADULT TO DRIVE YOU HOME AND BE WITH YOU FOR 24 HOURS. YOU MAY GO HOME BY TAXI OR UBER OR ORTHERWISE, BUT AN ADULT MUST ACCOMPANY YOU HOME AND STAY WITH YOU FOR 24 HOURS.  Name and phone number of your driver:  Special Instructions: N/A              Please read over the following fact sheets you were given: _____________________________________________________________________         [facility] - Preparing for Surgery Before surgery, you can play an important role.  Because skin is not sterile, your skin needs to be as free of germs as possible.  You can reduce the number of germs on your skin by washing with CHG (chlorahexidine gluconate) soap before surgery.  CHG is an antiseptic cleaner which kills germs and  bonds with the skin to continue killing germs even after washing. Please DO NOT use if you have an allergy to CHG or antibacterial soaps.  If your skin  becomes reddened/irritated stop using the CHG and inform your nurse when you arrive at Short Stay. Do not shave (including legs and underarms) for at least 48 hours prior to the first CHG shower.  You may shave your face/neck. Please follow these instructions carefully:  1.  Shower with CHG Soap the night before surgery and the  morning of Surgery.  2.  If you choose to wash your hair, wash your hair first as usual with your  normal  shampoo.  3.  After you shampoo, rinse your hair and body thoroughly to remove the  shampoo.                           4.  Use CHG as you would any other liquid soap.  You can apply chg directly  to the skin and wash                       Gently with a scrungie or clean washcloth.  5.  Apply the CHG Soap to your body ONLY FROM THE NECK DOWN.   Do not use on face/ open                           Wound or open sores. Avoid contact with eyes, ears mouth and genitals (private parts).                       Wash face,  Genitals (private parts) with your normal soap.             6.  Wash thoroughly, paying special attention to the area where your surgery  will be performed.  7.  Thoroughly rinse your body with warm water from the neck down.  8.  DO NOT shower/wash with your normal soap after using and rinsing off  the CHG Soap.                9.  Pat yourself dry with a clean towel.            10.  Wear clean pajamas.            11.  Place clean sheets on your bed the night of your first shower and do not  sleep with pets. Day of Surgery : Do not apply any lotions/deodorants the morning of surgery.  Please wear clean clothes to the hospital/surgery center.  FAILURE TO FOLLOW THESE INSTRUCTIONS MAY RESULT IN THE CANCELLATION OF YOUR SURGERY PATIENT SIGNATURE_________________________________  NURSE SIGNATURE__________________________________  ________________________________________________________________________

## 2020-02-07 ENCOUNTER — Encounter (HOSPITAL_COMMUNITY): Payer: Self-pay

## 2020-02-07 ENCOUNTER — Encounter (HOSPITAL_COMMUNITY)
Admission: RE | Admit: 2020-02-07 | Discharge: 2020-02-07 | Disposition: A | Discharge status: Home / Self Care | Payer: Medicare Other | Source: Ambulatory Visit | Attending: Orthopedic Surgery | Admitting: Orthopedic Surgery

## 2020-02-07 ENCOUNTER — Other Ambulatory Visit: Payer: Self-pay

## 2020-02-07 DIAGNOSIS — Z01812 Encounter for preprocedural laboratory examination: Secondary | ICD-10-CM | POA: Diagnosis not present

## 2020-02-07 LAB — CBC
HCT: 33.6 % — ABNORMAL LOW (ref 39.0–52.0)
Hemoglobin: 10.3 g/dL — ABNORMAL LOW (ref 13.0–17.0)
MCH: 28 pg (ref 26.0–34.0)
MCHC: 30.7 g/dL (ref 30.0–36.0)
MCV: 91.3 fL (ref 80.0–100.0)
Platelets: 324 10*3/uL (ref 150–400)
RBC: 3.68 MIL/uL — ABNORMAL LOW (ref 4.22–5.81)
RDW: 14.7 % (ref 11.5–15.5)
WBC: 6.6 10*3/uL (ref 4.0–10.5)
nRBC: 0 % (ref 0.0–0.2)

## 2020-02-07 NOTE — Progress Notes (Signed)
COVID Vaccine Completed: Yes Date COVID Vaccine completed: 10/2019 [place] COVID vaccine manufacturer: Pfizer  PCP -  Bonna Gains Nche. NP Cardiologist -   Chest x-ray -  EKG -  Stress Test -  ECHO -  Cardiac Cath -  Pacemaker/ICD device last checked:  Sleep Study -  CPAP -   Fasting Blood Sugar -  Checks Blood Sugar _____ times a day  Blood Thinner Instructions: Aspirin Instructions: Last Dose:  Anesthesia review:   Patient denies shortness of breath, fever, cough and chest pain at PAT appointment   Patient verbalized understanding of instructions that were given to them at the PAT appointment. Patient was also instructed that they will need to review over the PAT instructions again at home before surgery.

## 2020-02-13 ENCOUNTER — Other Ambulatory Visit (HOSPITAL_COMMUNITY)
Admission: RE | Admit: 2020-02-13 | Discharge: 2020-02-13 | Disposition: A | Discharge status: Home / Self Care | Payer: Medicare Other | Source: Ambulatory Visit | Attending: Orthopedic Surgery | Admitting: Orthopedic Surgery

## 2020-02-13 DIAGNOSIS — U071 COVID-19: Secondary | ICD-10-CM | POA: Diagnosis not present

## 2020-02-13 MED ORDER — DEXTROSE 5 % IV SOLN
3.0000 g | INTRAVENOUS | Status: DC
  Filled 2020-02-13: qty 3000

## 2020-02-13 NOTE — H&P (View-Only) (Signed)
Jacob Mitchell is an 74 y.o. male.   Chief Complaint: New periprosthetic fracture involving the greater trochanteric region of his left hip HPI: The patient is a 74 year old male who presents to the clinic with complaints of left hip pain.  Patient is 2 weeks out from attempted ORIF of the left greater trochanter oblique fracture.  He states that it has been a difficult recovery since that time with pain and weakness in the left leg.  During this time recovery has fallen into a wall but not all the way down to the ground.  During the fall he did hit his left side.  Upon his postop at the clinic x-rays were taken which reveal a new fracture to the left greater trochanter with posterior displacement.  Dr. Olin reviewed the situation and plans on bringing him back to the OR for a repeat ORIF to try to reduce/fixate the fracture.  Plan is to debride, bur and use a claw plate to fixate the fractured trochanter.  Dr. Olin is reviewed the risks, benefits and expectations.  Risks, benefits and expectations were discussed with the patient.  Risks including but not limited to the risk of anesthesia, blood clots, nerve damage, blood vessel damage, failure of the prosthesis, infection and up to and including death.  Patient understand the risks, benefits and expectations and wishes to proceed with surgery.   Past Medical History:  Diagnosis Date  . Benign localized prostatic hyperplasia with lower urinary tract symptoms (LUTS)   . Complication of anesthesia    pt says he gets "restless" when he is waking up and nurses have had to "hold him down"  . Diverticulosis of colon   . History of colon polyps   . History of kidney stones   . History of MRSA infection 2007   post right shoulder surgery  . History of urinary retention 05/31/2017   due to BPH  . Incomplete emptying of bladder   . OA (osteoarthritis)   . Peripheral neuropathy    feet-- hx frost bite  . Pinched nerve in neck    right index and middle  fingers numbness  . Wears glasses   . Wears hearing aid in both ears     Past Surgical History:  Procedure Laterality Date  . CATARACT EXTRACTION Left 01/13/2020  . COLONOSCOPY    . CYST REMOVAL LEG Right 1975  . CYSTOSCOPY WITH INSERTION OF UROLIFT N/A 07/20/2017   Procedure: CYSTOSCOPY WITH INSERTION OF UROLIFT;  Surgeon: McKenzie, Patrick L, MD;  Location: [facility];  Service: Urology;  Laterality: N/A;  . DISTAL CLAVICLE EXCISION Left 1977   left shoulder  . ELBOW SURGERY Right 1969;  1980  . HAND SURGERY Right 1968  . LAMINECTOMY  1970   L4-5  . LUMBAR FUSION  2013  approx.   L1--L5  . ORIF HIP FRACTURE Left 01/17/2020   Procedure: OPEN REDUCTION INTERNAL FIXATION LEFT HIP GREATER TROCHANTER FRACTURE;  Surgeon: Olin, Tidus Upchurch, MD;  Location: [facility];  Service: Orthopedics;  Laterality: Left;  90 MINS  . ROTATOR CUFF REPAIR Right x5    last one 2007   5 surgeries in 9 days--- original repair then 4 sx'  I&D sx's for MRSA infection  . SHOULDER ARTHROSCOPY WITH DISTAL CLAVICLE RESECTION Left 1977  . SHOULDER SURGERY Left 1976  . TOTAL HIP ARTHROPLASTY Left 11/23/2015   Procedure: LEFT TOTAL HIP ARTHROPLASTY ANTERIOR APPROACH;  Surgeon: Calix Heinbaugh Olin, MD;  Location: [facility];  Service:   Orthopedics;  Laterality: Left;  . TOTAL HIP REVISION     10/14/16 Dr. Charlann Boxer  . TOTAL HIP REVISION Left 10/14/2016   Procedure: TOTAL HIP REVISION POSTERIOR APPROACH;  Surgeon: Durene Romans, MD;  Location: [facility];  Service: Orthopedics;  Laterality: Left; (total of 4 hip surgeries in all)    Family History  Problem Relation Age of Onset  . Alcohol abuse Father   . Drug abuse Brother    Social History:  reports that he has never smoked. He has never used smokeless tobacco. He reports that he does not drink alcohol and does not use drugs.  Allergies:  Allergies  Allergen Reactions  . Aspartame And Phenylalanine Palpitations    Artificial sweetener- hrt 140  went to ED    No  medications prior to admission.     Review of Systems  Constitutional: Negative.   HENT: Negative.   Eyes: Negative.   Respiratory: Negative.   Cardiovascular: Negative.   Gastrointestinal: Negative.   Genitourinary: Negative.   Musculoskeletal: Positive for joint pain.  Skin: Negative.   Neurological: Negative.   Endo/Heme/Allergies: Negative.   Psychiatric/Behavioral: Negative.        Physical Exam Constitutional:      Appearance: He is well-developed.  HENT:     Head: Normocephalic.  Eyes:     Pupils: Pupils are equal, round, and reactive to light.  Neck:     Thyroid: No thyromegaly.     Vascular: No JVD.     Trachea: No tracheal deviation.  Cardiovascular:     Rate and Rhythm: Normal rate and regular rhythm.     Pulses: Intact distal pulses.  Pulmonary:     Effort: Pulmonary effort is normal. No respiratory distress.     Breath sounds: Normal breath sounds. No wheezing.  Abdominal:     Palpations: Abdomen is soft.     Tenderness: There is no abdominal tenderness. There is no guarding.  Musculoskeletal:     Cervical back: Neck supple.     Left hip: Laceration (healing previous incision), tenderness and bony tenderness present. Decreased range of motion. Decreased strength.  Lymphadenopathy:     Cervical: No cervical adenopathy.  Skin:    General: Skin is warm and dry.  Neurological:     Mental Status: He is alert and oriented to person, place, and time.  Psychiatric:        Mood and Affect: Mood and affect normal.         Assessment/Plan 1.  New periprosthetic fracture involving the greater trochanteric region of his left hip with posterior displacement 2.  Complex history involving his left total hip arthroplasty recently complicated by a long oblique fracture involving his greater trochanter now 15 days status post an initial attempt at open reduction internal fixation.     Plan on returning to the OR on 02/14/2019 for a ORIF of left troch fracture  per Dr. Charlann Boxer  Plan is to debride the area, use a bur and use a claw plate to fixate the fracture  Patient knows to be NPO at midnight prior to surgery  Risks, benefits and expectations were discussed with the patient.  Risks including but not limited to the risk of anesthesia, blood clots, nerve damage, blood vessel damage, failure of the prosthesis, infection and up to and including death.  Patient understand the risks, benefits and expectations and wishes to proceed with surgery.       Genelle Gather Malasha Kleppe, PA-C 02/13/2020, 9:18 AM

## 2020-02-13 NOTE — H&P (Signed)
Jacob Mitchell is an 75 y.o. male.   Chief Complaint: New periprosthetic fracture involving the greater trochanteric region of his left hip HPI: The patient is a 75 year old male who presents to the clinic with complaints of left hip pain.  Patient is 2 weeks out from attempted ORIF of the left greater trochanter oblique fracture.  He states that it has been a difficult recovery since that time with pain and weakness in the left leg.  During this time recovery has fallen into a wall but not all the way down to the ground.  During the fall he did hit his left side.  Upon his postop at the clinic x-rays were taken which reveal a new fracture to the left greater trochanter with posterior displacement.  Dr. Charlann Boxer reviewed the situation and plans on bringing him back to the OR for a repeat ORIF to try to reduce/fixate the fracture.  Plan is to debride, bur and use a claw plate to fixate the fractured trochanter.  Dr. Charlann Boxer is reviewed the risks, benefits and expectations.  Risks, benefits and expectations were discussed with the patient.  Risks including but not limited to the risk of anesthesia, blood clots, nerve damage, blood vessel damage, failure of the prosthesis, infection and up to and including death.  Patient understand the risks, benefits and expectations and wishes to proceed with surgery.   Past Medical History:  Diagnosis Date  . Benign localized prostatic hyperplasia with lower urinary tract symptoms (LUTS)   . Complication of anesthesia    pt says he gets "restless" when he is waking up and nurses have had to "hold him down"  . Diverticulosis of colon   . History of colon polyps   . History of kidney stones   . History of MRSA infection 2007   post right shoulder surgery  . History of urinary retention 05/31/2017   due to BPH  . Incomplete emptying of bladder   . OA (osteoarthritis)   . Peripheral neuropathy    feet-- hx frost bite  . Pinched nerve in neck    right index and middle  fingers numbness  . Wears glasses   . Wears hearing aid in both ears     Past Surgical History:  Procedure Laterality Date  . CATARACT EXTRACTION Left 01/13/2020  . COLONOSCOPY    . CYST REMOVAL LEG Right 1975  . CYSTOSCOPY WITH INSERTION OF UROLIFT N/A 07/20/2017   Procedure: CYSTOSCOPY WITH INSERTION OF UROLIFT;  Surgeon: Jacob Gauze, MD;  Location: [facility];  Service: Urology;  Laterality: N/A;  . DISTAL CLAVICLE EXCISION Left 1977   left shoulder  . ELBOW SURGERY Right 1969;  1980  . HAND SURGERY Right 1968  . LAMINECTOMY  1970   L4-5  . LUMBAR FUSION  2013  approx.   L1--L5  . ORIF HIP FRACTURE Left 01/17/2020   Procedure: OPEN REDUCTION INTERNAL FIXATION LEFT HIP GREATER TROCHANTER FRACTURE;  Surgeon: Durene Romans, MD;  Location: [facility];  Service: Orthopedics;  Laterality: Left;  90 MINS  . ROTATOR CUFF REPAIR Right x5    last one 2007   5 surgeries in 9 days--- original repair then 4 sx'  I&D sx's for MRSA infection  . SHOULDER ARTHROSCOPY WITH DISTAL CLAVICLE RESECTION Left 1977  . SHOULDER SURGERY Left 1976  . TOTAL HIP ARTHROPLASTY Left 11/23/2015   Procedure: LEFT TOTAL HIP ARTHROPLASTY ANTERIOR APPROACH;  Surgeon: Durene Romans, MD;  Location: [facility];  Service:  Orthopedics;  Laterality: Left;  . TOTAL HIP REVISION     10/14/16 Dr. Charlann Boxer  . TOTAL HIP REVISION Left 10/14/2016   Procedure: TOTAL HIP REVISION POSTERIOR APPROACH;  Surgeon: Durene Romans, MD;  Location: [facility];  Service: Orthopedics;  Laterality: Left; (total of 4 hip surgeries in all)    Family History  Problem Relation Age of Onset  . Alcohol abuse Father   . Drug abuse Brother    Social History:  reports that he has never smoked. He has never used smokeless tobacco. He reports that he does not drink alcohol and does not use drugs.  Allergies:  Allergies  Allergen Reactions  . Aspartame And Phenylalanine Palpitations    Artificial sweetener- hrt 140  went to ED    No  medications prior to admission.     Review of Systems  Constitutional: Negative.   HENT: Negative.   Eyes: Negative.   Respiratory: Negative.   Cardiovascular: Negative.   Gastrointestinal: Negative.   Genitourinary: Negative.   Musculoskeletal: Positive for joint pain.  Skin: Negative.   Neurological: Negative.   Endo/Heme/Allergies: Negative.   Psychiatric/Behavioral: Negative.        Physical Exam Constitutional:      Appearance: He is well-developed.  HENT:     Head: Normocephalic.  Eyes:     Pupils: Pupils are equal, round, and reactive to light.  Neck:     Thyroid: No thyromegaly.     Vascular: No JVD.     Trachea: No tracheal deviation.  Cardiovascular:     Rate and Rhythm: Normal rate and regular rhythm.     Pulses: Intact distal pulses.  Pulmonary:     Effort: Pulmonary effort is normal. No respiratory distress.     Breath sounds: Normal breath sounds. No wheezing.  Abdominal:     Palpations: Abdomen is soft.     Tenderness: There is no abdominal tenderness. There is no guarding.  Musculoskeletal:     Cervical back: Neck supple.     Left hip: Laceration (healing previous incision), tenderness and bony tenderness present. Decreased range of motion. Decreased strength.  Lymphadenopathy:     Cervical: No cervical adenopathy.  Skin:    General: Skin is warm and dry.  Neurological:     Mental Status: He is alert and oriented to person, place, and time.  Psychiatric:        Mood and Affect: Mood and affect normal.         Assessment/Plan 1.  New periprosthetic fracture involving the greater trochanteric region of his left hip with posterior displacement 2.  Complex history involving his left total hip arthroplasty recently complicated by a long oblique fracture involving his greater trochanter now 15 days status post an initial attempt at open reduction internal fixation.     Plan on returning to the OR on 02/14/2019 for a ORIF of left troch fracture  per Dr. Charlann Boxer  Plan is to debride the area, use a bur and use a claw plate to fixate the fracture  Patient knows to be NPO at midnight prior to surgery  Risks, benefits and expectations were discussed with the patient.  Risks including but not limited to the risk of anesthesia, blood clots, nerve damage, blood vessel damage, failure of the prosthesis, infection and up to and including death.  Patient understand the risks, benefits and expectations and wishes to proceed with surgery.       Genelle Gather Emmalena Canny, PA-C 02/13/2020, 9:18 AM

## 2020-02-14 ENCOUNTER — Encounter (HOSPITAL_COMMUNITY): Admission: RE | Payer: Self-pay | Source: Home / Self Care

## 2020-02-14 ENCOUNTER — Inpatient Hospital Stay (HOSPITAL_COMMUNITY): Admission: RE | Admit: 2020-02-14 | Payer: Medicare Other | Source: Home / Self Care | Admitting: Orthopedic Surgery

## 2020-02-14 ENCOUNTER — Encounter (HOSPITAL_COMMUNITY): Payer: Self-pay | Admitting: Certified Registered Nurse Anesthetist

## 2020-02-14 LAB — SARS CORONAVIRUS 2 (TAT 6-24 HRS): SARS Coronavirus 2: POSITIVE — AB

## 2020-02-14 SURGERY — OPEN REDUCTION INTERNAL FIXATION (ORIF) PERIPROSTHETIC FRACTURE
Anesthesia: Spinal | Laterality: Left

## 2020-02-22 NOTE — Progress Notes (Signed)
Spoke to patient and advised him of arrival time for surgery scheduled for 02-27-20.

## 2020-02-27 ENCOUNTER — Encounter (HOSPITAL_COMMUNITY)
Admission: RE | Disposition: A | Discharge status: Home / Self Care | Payer: Self-pay | Source: Home / Self Care | Attending: Orthopedic Surgery

## 2020-02-27 ENCOUNTER — Encounter (HOSPITAL_COMMUNITY): Payer: Self-pay | Admitting: Orthopedic Surgery

## 2020-02-27 ENCOUNTER — Inpatient Hospital Stay (HOSPITAL_COMMUNITY)
Admission: RE | Admit: 2020-02-27 | Discharge: 2020-02-29 | DRG: 481 | Disposition: A | Discharge status: Home / Self Care | Payer: Medicare Other | Attending: Orthopedic Surgery | Admitting: Orthopedic Surgery

## 2020-02-27 ENCOUNTER — Inpatient Hospital Stay (HOSPITAL_COMMUNITY): Payer: Medicare Other | Admitting: Certified Registered Nurse Anesthetist

## 2020-02-27 ENCOUNTER — Inpatient Hospital Stay (HOSPITAL_COMMUNITY): Payer: Medicare Other

## 2020-02-27 DIAGNOSIS — Z96649 Presence of unspecified artificial hip joint: Secondary | ICD-10-CM

## 2020-02-27 DIAGNOSIS — Z981 Arthrodesis status: Secondary | ICD-10-CM | POA: Diagnosis not present

## 2020-02-27 DIAGNOSIS — S72113A Displaced fracture of greater trochanter of unspecified femur, initial encounter for closed fracture: Secondary | ICD-10-CM | POA: Diagnosis present

## 2020-02-27 DIAGNOSIS — Z96642 Presence of left artificial hip joint: Secondary | ICD-10-CM | POA: Diagnosis not present

## 2020-02-27 DIAGNOSIS — Z974 Presence of external hearing-aid: Secondary | ICD-10-CM | POA: Diagnosis not present

## 2020-02-27 DIAGNOSIS — W010XXA Fall on same level from slipping, tripping and stumbling without subsequent striking against object, initial encounter: Secondary | ICD-10-CM | POA: Diagnosis present

## 2020-02-27 DIAGNOSIS — Z9102 Food additives allergy status: Secondary | ICD-10-CM | POA: Diagnosis not present

## 2020-02-27 DIAGNOSIS — Z87442 Personal history of urinary calculi: Secondary | ICD-10-CM | POA: Diagnosis not present

## 2020-02-27 DIAGNOSIS — M1990 Unspecified osteoarthritis, unspecified site: Secondary | ICD-10-CM | POA: Diagnosis present

## 2020-02-27 DIAGNOSIS — Z8719 Personal history of other diseases of the digestive system: Secondary | ICD-10-CM

## 2020-02-27 DIAGNOSIS — S72112A Displaced fracture of greater trochanter of left femur, initial encounter for closed fracture: Secondary | ICD-10-CM | POA: Diagnosis not present

## 2020-02-27 DIAGNOSIS — Z471 Aftercare following joint replacement surgery: Secondary | ICD-10-CM | POA: Diagnosis not present

## 2020-02-27 DIAGNOSIS — Y9301 Activity, walking, marching and hiking: Secondary | ICD-10-CM | POA: Diagnosis present

## 2020-02-27 DIAGNOSIS — N400 Enlarged prostate without lower urinary tract symptoms: Secondary | ICD-10-CM | POA: Diagnosis present

## 2020-02-27 DIAGNOSIS — G4700 Insomnia, unspecified: Secondary | ICD-10-CM | POA: Diagnosis not present

## 2020-02-27 DIAGNOSIS — D62 Acute posthemorrhagic anemia: Secondary | ICD-10-CM | POA: Diagnosis not present

## 2020-02-27 DIAGNOSIS — M9702XA Periprosthetic fracture around internal prosthetic left hip joint, initial encounter: Secondary | ICD-10-CM | POA: Diagnosis not present

## 2020-02-27 DIAGNOSIS — M25552 Pain in left hip: Secondary | ICD-10-CM | POA: Diagnosis present

## 2020-02-27 DIAGNOSIS — Z8614 Personal history of Methicillin resistant Staphylococcus aureus infection: Secondary | ICD-10-CM

## 2020-02-27 DIAGNOSIS — Y92009 Unspecified place in unspecified non-institutional (private) residence as the place of occurrence of the external cause: Secondary | ICD-10-CM | POA: Diagnosis not present

## 2020-02-27 DIAGNOSIS — D649 Anemia, unspecified: Secondary | ICD-10-CM | POA: Diagnosis not present

## 2020-02-27 HISTORY — PX: ORIF PERIPROSTHETIC FRACTURE: SHX5034

## 2020-02-27 LAB — CBC
HCT: 37.5 % — ABNORMAL LOW (ref 39.0–52.0)
Hemoglobin: 11.8 g/dL — ABNORMAL LOW (ref 13.0–17.0)
MCH: 27.2 pg (ref 26.0–34.0)
MCHC: 31.5 g/dL (ref 30.0–36.0)
MCV: 86.4 fL (ref 80.0–100.0)
Platelets: 219 10*3/uL (ref 150–400)
RBC: 4.34 MIL/uL (ref 4.22–5.81)
RDW: 14.8 % (ref 11.5–15.5)
WBC: 6.3 10*3/uL (ref 4.0–10.5)
nRBC: 0 % (ref 0.0–0.2)

## 2020-02-27 LAB — POCT I-STAT EG7
Acid-base deficit: 2 mmol/L (ref 0.0–2.0)
Bicarbonate: 25.5 mmol/L (ref 20.0–28.0)
Calcium, Ion: 1.38 mmol/L (ref 1.15–1.40)
HCT: 30 % — ABNORMAL LOW (ref 39.0–52.0)
Hemoglobin: 10.2 g/dL — ABNORMAL LOW (ref 13.0–17.0)
O2 Saturation: 92 %
Patient temperature: 35.4
Potassium: 4.2 mmol/L (ref 3.5–5.1)
Sodium: 138 mmol/L (ref 135–145)
TCO2: 27 mmol/L (ref 22–32)
pCO2, Ven: 50 mmHg (ref 44.0–60.0)
pH, Ven: 7.307 (ref 7.250–7.430)
pO2, Ven: 65 mmHg — ABNORMAL HIGH (ref 32.0–45.0)

## 2020-02-27 LAB — SURGICAL PCR SCREEN
MRSA, PCR: NEGATIVE
Staphylococcus aureus: NEGATIVE

## 2020-02-27 LAB — TYPE AND SCREEN
ABO/RH(D): O POS
Antibody Screen: NEGATIVE

## 2020-02-27 SURGERY — OPEN REDUCTION INTERNAL FIXATION (ORIF) PERIPROSTHETIC FRACTURE
Anesthesia: General | Site: Hip | Laterality: Left

## 2020-02-27 MED ORDER — ROCURONIUM BROMIDE 10 MG/ML (PF) SYRINGE
PREFILLED_SYRINGE | INTRAVENOUS | Status: AC
  Filled 2020-02-27: qty 10

## 2020-02-27 MED ORDER — LACTATED RINGERS IV SOLN: INTRAVENOUS | Status: DC

## 2020-02-27 MED ORDER — BISACODYL 10 MG RE SUPP: 10.0000 mg | Freq: Every day | RECTAL | Status: DC | PRN

## 2020-02-27 MED ORDER — SODIUM CHLORIDE 0.9 % IV SOLN: INTRAVENOUS | Status: DC

## 2020-02-27 MED ORDER — PREDNISOLONE ACETATE 1 % OP SUSP
1.0000 [drp] | Freq: Three times a day (TID) | OPHTHALMIC | Status: DC
  Filled 2020-02-27 (×2): qty 5

## 2020-02-27 MED ORDER — ROCURONIUM BROMIDE 10 MG/ML (PF) SYRINGE
PREFILLED_SYRINGE | INTRAVENOUS | Status: DC | PRN
  Administered 2020-02-27: 70 mg via INTRAVENOUS
  Administered 2020-02-27 (×2): 30 mg via INTRAVENOUS
  Administered 2020-02-27: 20 mg via INTRAVENOUS

## 2020-02-27 MED ORDER — METOCLOPRAMIDE HCL 5 MG PO TABS: 5.0000 mg | ORAL_TABLET | Freq: Three times a day (TID) | ORAL | Status: DC | PRN

## 2020-02-27 MED ORDER — FENTANYL CITRATE (PF) 250 MCG/5ML IJ SOLN
INTRAMUSCULAR | Status: DC | PRN
  Administered 2020-02-27 (×3): 50 ug via INTRAVENOUS
  Administered 2020-02-27: 100 ug via INTRAVENOUS
  Administered 2020-02-27 (×2): 50 ug via INTRAVENOUS

## 2020-02-27 MED ORDER — ONDANSETRON HCL 4 MG/2ML IJ SOLN
INTRAMUSCULAR | Status: AC
  Filled 2020-02-27: qty 2

## 2020-02-27 MED ORDER — HYDROMORPHONE HCL 1 MG/ML IJ SOLN: 0.5000 mg | INTRAMUSCULAR | Status: DC | PRN

## 2020-02-27 MED ORDER — FENTANYL CITRATE (PF) 100 MCG/2ML IJ SOLN
INTRAMUSCULAR | Status: AC
  Filled 2020-02-27: qty 2

## 2020-02-27 MED ORDER — HYDROCODONE-ACETAMINOPHEN 5-325 MG PO TABS
1.0000 | ORAL_TABLET | ORAL | Status: DC | PRN
  Administered 2020-02-29: 1 via ORAL
  Filled 2020-02-27: qty 1

## 2020-02-27 MED ORDER — ACETAMINOPHEN 325 MG PO TABS: 325.0000 mg | ORAL_TABLET | Freq: Four times a day (QID) | ORAL | Status: DC | PRN

## 2020-02-27 MED ORDER — TRANEXAMIC ACID-NACL 1000-0.7 MG/100ML-% IV SOLN
1000.0000 mg | Freq: Once | INTRAVENOUS | Status: AC
  Administered 2020-02-27: 1000 mg via INTRAVENOUS
  Filled 2020-02-27: qty 100

## 2020-02-27 MED ORDER — POLYETHYLENE GLYCOL 3350 17 G PO PACK
17.0000 g | PACK | Freq: Two times a day (BID) | ORAL | Status: DC
  Administered 2020-02-27 – 2020-02-29 (×2): 17 g via ORAL
  Filled 2020-02-27 (×4): qty 1

## 2020-02-27 MED ORDER — DEXAMETHASONE SODIUM PHOSPHATE 10 MG/ML IJ SOLN
8.0000 mg | Freq: Once | INTRAMUSCULAR | Status: DC
  Administered 2020-02-27: 8 mg via INTRAVENOUS

## 2020-02-27 MED ORDER — POVIDONE-IODINE 10 % EX SWAB: 2.0000 "application " | Freq: Once | CUTANEOUS | Status: DC

## 2020-02-27 MED ORDER — DIPHENHYDRAMINE HCL 12.5 MG/5ML PO ELIX: 12.5000 mg | ORAL_SOLUTION | ORAL | Status: DC | PRN

## 2020-02-27 MED ORDER — ONDANSETRON HCL 4 MG PO TABS: 4.0000 mg | ORAL_TABLET | Freq: Four times a day (QID) | ORAL | Status: DC | PRN

## 2020-02-27 MED ORDER — PROPOFOL 10 MG/ML IV BOLUS
INTRAVENOUS | Status: DC | PRN
  Administered 2020-02-27: 200 mg via INTRAVENOUS

## 2020-02-27 MED ORDER — CEFAZOLIN SODIUM-DEXTROSE 2-4 GM/100ML-% IV SOLN
2.0000 g | INTRAVENOUS | Status: DC
  Filled 2020-02-27: qty 100

## 2020-02-27 MED ORDER — MENTHOL 3 MG MT LOZG: 1.0000 | LOZENGE | OROMUCOSAL | Status: DC | PRN

## 2020-02-27 MED ORDER — ALUM & MAG HYDROXIDE-SIMETH 200-200-20 MG/5ML PO SUSP: 15.0000 mL | ORAL | Status: DC | PRN

## 2020-02-27 MED ORDER — FENTANYL CITRATE (PF) 100 MCG/2ML IJ SOLN
25.0000 ug | INTRAMUSCULAR | Status: DC | PRN
  Administered 2020-02-27 (×2): 50 ug via INTRAVENOUS

## 2020-02-27 MED ORDER — EPHEDRINE SULFATE-NACL 50-0.9 MG/10ML-% IV SOSY
PREFILLED_SYRINGE | INTRAVENOUS | Status: DC | PRN
  Administered 2020-02-27 (×3): 10 mg via INTRAVENOUS

## 2020-02-27 MED ORDER — EPHEDRINE 5 MG/ML INJ
INTRAVENOUS | Status: AC
  Filled 2020-02-27: qty 10

## 2020-02-27 MED ORDER — OXYCODONE HCL 5 MG/5ML PO SOLN: 5.0000 mg | Freq: Once | ORAL | Status: DC | PRN

## 2020-02-27 MED ORDER — OXYCODONE HCL 5 MG PO TABS: 5.0000 mg | ORAL_TABLET | Freq: Once | ORAL | Status: DC | PRN

## 2020-02-27 MED ORDER — DOCUSATE SODIUM 100 MG PO CAPS
100.0000 mg | ORAL_CAPSULE | Freq: Two times a day (BID) | ORAL | Status: DC
Start: 2020-02-27 — End: 2020-02-29
  Administered 2020-02-27 – 2020-02-29 (×3): 100 mg via ORAL
  Filled 2020-02-27 (×4): qty 1

## 2020-02-27 MED ORDER — FERROUS SULFATE 325 (65 FE) MG PO TABS
325.0000 mg | ORAL_TABLET | Freq: Three times a day (TID) | ORAL | Status: DC
  Administered 2020-02-28 – 2020-02-29 (×4): 325 mg via ORAL
  Filled 2020-02-27 (×4): qty 1

## 2020-02-27 MED ORDER — METOCLOPRAMIDE HCL 5 MG/ML IJ SOLN: 5.0000 mg | Freq: Three times a day (TID) | INTRAMUSCULAR | Status: DC | PRN

## 2020-02-27 MED ORDER — ASPIRIN 81 MG PO CHEW
81.0000 mg | CHEWABLE_TABLET | Freq: Two times a day (BID) | ORAL | Status: DC
  Administered 2020-02-27 – 2020-02-29 (×4): 81 mg via ORAL
  Filled 2020-02-27 (×4): qty 1

## 2020-02-27 MED ORDER — DEXAMETHASONE SODIUM PHOSPHATE 10 MG/ML IJ SOLN
10.0000 mg | Freq: Once | INTRAMUSCULAR | Status: AC
  Administered 2020-02-28: 10 mg via INTRAVENOUS
  Filled 2020-02-27: qty 1

## 2020-02-27 MED ORDER — METHOCARBAMOL 500 MG IVPB - SIMPLE MED
INTRAVENOUS | Status: AC
  Administered 2020-02-27: 500 mg
  Filled 2020-02-27: qty 50

## 2020-02-27 MED ORDER — PROPOFOL 10 MG/ML IV BOLUS
INTRAVENOUS | Status: AC
  Filled 2020-02-27: qty 20

## 2020-02-27 MED ORDER — METHOCARBAMOL 500 MG PO TABS: 500.0000 mg | ORAL_TABLET | Freq: Four times a day (QID) | ORAL | Status: DC | PRN

## 2020-02-27 MED ORDER — METHOCARBAMOL 500 MG IVPB - SIMPLE MED
500.0000 mg | Freq: Four times a day (QID) | INTRAVENOUS | Status: DC | PRN
  Filled 2020-02-27: qty 50

## 2020-02-27 MED ORDER — CELECOXIB 200 MG PO CAPS
200.0000 mg | ORAL_CAPSULE | Freq: Two times a day (BID) | ORAL | Status: DC
  Administered 2020-02-27 – 2020-02-29 (×4): 200 mg via ORAL
  Filled 2020-02-27 (×4): qty 1

## 2020-02-27 MED ORDER — 0.9 % SODIUM CHLORIDE (POUR BTL) OPTIME
TOPICAL | Status: DC | PRN
  Administered 2020-02-27: 1000 mL

## 2020-02-27 MED ORDER — SUGAMMADEX SODIUM 500 MG/5ML IV SOLN
INTRAVENOUS | Status: AC
  Filled 2020-02-27: qty 5

## 2020-02-27 MED ORDER — SUGAMMADEX SODIUM 500 MG/5ML IV SOLN
INTRAVENOUS | Status: DC | PRN
  Administered 2020-02-27: 300 mg via INTRAVENOUS

## 2020-02-27 MED ORDER — MAGNESIUM CITRATE PO SOLN: 1.0000 | Freq: Once | ORAL | Status: DC | PRN

## 2020-02-27 MED ORDER — DEXAMETHASONE SODIUM PHOSPHATE 10 MG/ML IJ SOLN
INTRAMUSCULAR | Status: AC
  Filled 2020-02-27: qty 1

## 2020-02-27 MED ORDER — TRANEXAMIC ACID-NACL 1000-0.7 MG/100ML-% IV SOLN
1000.0000 mg | INTRAVENOUS | Status: DC
  Administered 2020-02-27: 1000 mg via INTRAVENOUS
  Filled 2020-02-27: qty 100

## 2020-02-27 MED ORDER — AMISULPRIDE (ANTIEMETIC) 5 MG/2ML IV SOLN: 10.0000 mg | Freq: Once | INTRAVENOUS | Status: DC | PRN

## 2020-02-27 MED ORDER — SODIUM CHLORIDE 0.9 % IR SOLN
Status: DC | PRN
  Administered 2020-02-27: 1000 mL

## 2020-02-27 MED ORDER — PHENOL 1.4 % MT LIQD: 1.0000 | OROMUCOSAL | Status: DC | PRN

## 2020-02-27 MED ORDER — ONDANSETRON HCL 4 MG/2ML IJ SOLN: 4.0000 mg | Freq: Four times a day (QID) | INTRAMUSCULAR | Status: DC | PRN

## 2020-02-27 MED ORDER — ONDANSETRON HCL 4 MG/2ML IJ SOLN
INTRAMUSCULAR | Status: DC | PRN
  Administered 2020-02-27: 4 mg via INTRAVENOUS

## 2020-02-27 MED ORDER — ALBUMIN HUMAN 5 % IV SOLN: INTRAVENOUS | Status: DC | PRN

## 2020-02-27 MED ORDER — DEXTROSE 5 % IV SOLN
3.0000 g | INTRAVENOUS | Status: DC
  Administered 2020-02-27: 3 g via INTRAVENOUS
  Filled 2020-02-27: qty 3

## 2020-02-27 MED ORDER — ALBUMIN HUMAN 5 % IV SOLN
INTRAVENOUS | Status: AC
  Filled 2020-02-27: qty 500

## 2020-02-27 MED ORDER — LIDOCAINE HCL (PF) 2 % IJ SOLN
INTRAMUSCULAR | Status: AC
  Filled 2020-02-27: qty 5

## 2020-02-27 MED ORDER — ONDANSETRON HCL 4 MG/2ML IJ SOLN: 4.0000 mg | Freq: Once | INTRAMUSCULAR | Status: DC | PRN

## 2020-02-27 MED ORDER — HYDROCODONE-ACETAMINOPHEN 7.5-325 MG PO TABS
1.0000 | ORAL_TABLET | ORAL | Status: DC | PRN
  Administered 2020-02-27 – 2020-02-28 (×2): 2 via ORAL
  Filled 2020-02-27 (×2): qty 2

## 2020-02-27 MED ORDER — CHLORHEXIDINE GLUCONATE 4 % EX LIQD: 60.0000 mL | Freq: Once | CUTANEOUS | Status: DC

## 2020-02-27 MED ORDER — LIDOCAINE 2% (20 MG/ML) 5 ML SYRINGE
INTRAMUSCULAR | Status: DC | PRN
  Administered 2020-02-27: 60 mg via INTRAVENOUS

## 2020-02-27 MED ORDER — FENTANYL CITRATE (PF) 250 MCG/5ML IJ SOLN
INTRAMUSCULAR | Status: AC
  Filled 2020-02-27: qty 5

## 2020-02-27 MED ORDER — STERILE WATER FOR IRRIGATION IR SOLN
Status: DC | PRN
  Administered 2020-02-27: 2000 mL

## 2020-02-27 MED ORDER — CEFAZOLIN SODIUM-DEXTROSE 2-4 GM/100ML-% IV SOLN
2.0000 g | Freq: Four times a day (QID) | INTRAVENOUS | Status: AC
  Administered 2020-02-27 – 2020-02-28 (×2): 2 g via INTRAVENOUS
  Filled 2020-02-27 (×2): qty 100

## 2020-02-27 MED ORDER — TRAZODONE HCL 100 MG PO TABS
100.0000 mg | ORAL_TABLET | Freq: Every day | ORAL | Status: DC
  Administered 2020-02-27 – 2020-02-28 (×2): 100 mg via ORAL
  Filled 2020-02-27 (×2): qty 1

## 2020-02-27 SURGICAL SUPPLY — 59 items
ADH SKN CLS APL DERMABOND .7 (GAUZE/BANDAGES/DRESSINGS) ×1
BAG SPEC THK2 15X12 ZIP CLS (MISCELLANEOUS) ×1
BAG ZIPLOCK 12X15 (MISCELLANEOUS) ×2 IMPLANT
BLADE SAW SGTL 11.0X1.19X90.0M (BLADE) IMPLANT
CABLE EXT 4H GTR W/4 23X232 (Cable) ×1 IMPLANT
CABLE GTR COCR 1.8X635 (Orthopedic Implant) ×1 IMPLANT
COVER SURGICAL LIGHT HANDLE (MISCELLANEOUS) ×2 IMPLANT
COVER WAND RF STERILE (DRAPES) IMPLANT
DERMABOND ADVANCED (GAUZE/BANDAGES/DRESSINGS) ×1
DERMABOND ADVANCED .7 DNX12 (GAUZE/BANDAGES/DRESSINGS) IMPLANT
DRAPE INCISE IOBAN 66X45 STRL (DRAPES) ×2 IMPLANT
DRAPE ORTHO SPLIT 77X108 STRL (DRAPES) ×4
DRAPE POUCH INSTRU U-SHP 10X18 (DRAPES) ×2 IMPLANT
DRAPE SURG 17X11 SM STRL (DRAPES) ×2 IMPLANT
DRAPE SURG ORHT 6 SPLT 77X108 (DRAPES) ×2 IMPLANT
DRAPE U-SHAPE 47X51 STRL (DRAPES) ×2 IMPLANT
DRSG AQUACEL AG ADV 3.5X14 (GAUZE/BANDAGES/DRESSINGS) ×1 IMPLANT
DRSG EMULSION OIL 3X16 NADH (GAUZE/BANDAGES/DRESSINGS) ×2 IMPLANT
DRSG PAD ABDOMINAL 8X10 ST (GAUZE/BANDAGES/DRESSINGS) ×4 IMPLANT
DURAPREP 26ML APPLICATOR (WOUND CARE) ×2 IMPLANT
ELECT BLADE TIP CTD 4 INCH (ELECTRODE) ×2 IMPLANT
ELECT REM PT RETURN 15FT ADLT (MISCELLANEOUS) ×2 IMPLANT
EVACUATOR 1/8 PVC DRAIN (DRAIN) ×2 IMPLANT
FACESHIELD WRAPAROUND (MASK) ×8 IMPLANT
FACESHIELD WRAPAROUND OR TEAM (MASK) ×4 IMPLANT
GAUZE SPONGE 4X4 12PLY STRL (GAUZE/BANDAGES/DRESSINGS) ×4 IMPLANT
GLOVE BIOGEL M 7.0 STRL (GLOVE) IMPLANT
GLOVE BIOGEL PI IND STRL 8.5 (GLOVE) ×1 IMPLANT
GLOVE BIOGEL PI INDICATOR 8.5 (GLOVE) ×1
GLOVE ECLIPSE 8.0 STRL XLNG CF (GLOVE) IMPLANT
GLOVE ORTHO TXT STRL SZ7.5 (GLOVE) ×2 IMPLANT
GLOVE SURG ORTHO 8.0 STRL STRW (GLOVE) ×2 IMPLANT
GLOVE SURG UNDER POLY LF SZ7.5 (GLOVE) ×2 IMPLANT
GOWN STRL REUS W/TWL LRG LVL3 (GOWN DISPOSABLE) ×2 IMPLANT
GOWN STRL REUS W/TWL XL LVL3 (GOWN DISPOSABLE) ×4 IMPLANT
IMMOBILIZER KNEE 20 (SOFTGOODS)
IMMOBILIZER KNEE 20 THIGH 36 (SOFTGOODS) IMPLANT
KIT BASIN OR (CUSTOM PROCEDURE TRAY) ×2 IMPLANT
KIT TURNOVER KIT A (KITS) IMPLANT
MANIFOLD NEPTUNE II (INSTRUMENTS) ×2 IMPLANT
NS IRRIG 1000ML POUR BTL (IV SOLUTION) ×4 IMPLANT
PACK TOTAL JOINT (CUSTOM PROCEDURE TRAY) ×2 IMPLANT
PASSER SUT SWANSON 36MM LOOP (INSTRUMENTS) IMPLANT
PENCIL SMOKE EVACUATOR (MISCELLANEOUS) IMPLANT
PROTECTOR NERVE ULNAR (MISCELLANEOUS) ×2 IMPLANT
SPONGE LAP 18X18 RF (DISPOSABLE) ×2 IMPLANT
SPONGE LAP 4X18 RFD (DISPOSABLE) ×2 IMPLANT
STAPLER VISISTAT 35W (STAPLE) ×2 IMPLANT
STRIP CLOSURE SKIN 1/2X4 (GAUZE/BANDAGES/DRESSINGS) IMPLANT
SUCTION FRAZIER HANDLE 10FR (MISCELLANEOUS) ×2
SUCTION TUBE FRAZIER 10FR DISP (MISCELLANEOUS) ×1 IMPLANT
SUT ETHIBOND NAB CT1 #1 30IN (SUTURE) IMPLANT
SUT MNCRL AB 4-0 PS2 18 (SUTURE) IMPLANT
SUT VIC AB 1 CT1 36 (SUTURE) ×6 IMPLANT
SUT VIC AB 2-0 CT1 27 (SUTURE) ×6
SUT VIC AB 2-0 CT1 TAPERPNT 27 (SUTURE) ×3 IMPLANT
TOWEL OR 17X26 10 PK STRL BLUE (TOWEL DISPOSABLE) ×4 IMPLANT
TRAY FOLEY MTR SLVR 16FR STAT (SET/KITS/TRAYS/PACK) ×2 IMPLANT
WATER STERILE IRR 1000ML POUR (IV SOLUTION) ×2 IMPLANT

## 2020-02-27 NOTE — Op Note (Signed)
NAME: Jacob Mitchell, Jacob Mitchell MEDICAL RECORD [id number] ACCOUNT [id number] DATE OF BIRTH:04-26-1945 FACILITY: [facility] LOCATION: [facility] PHYSICIAN:Edilia Ghuman DAlvan Dame, MD  OPERATIVE REPORT  DATE OF PROCEDURE:  02/27/2020  PREOPERATIVE DIAGNOSIS:  Displaced left greater trochanteric periprosthetic femur fracture with stable femoral component.  POSTOPERATIVE DIAGNOSIS:  Displaced left greater trochanteric periprosthetic femur fracture with stable femoral component.  PROCEDURE:  Open reduction and internal fixation of left periprosthetic greater trochanteric femur fracture.  COMPONENTS USED:  Zimmer cable claw plate with 4 holes over the shaft of the femur with 2 available over the trochanter.  SURGEON:  Paralee Cancel, MD  ASSISTANT:  Danae Orleans, PA-C  Note that Mr. Jacob Mitchell was present for the entirety of the case from preoperative positioning, perioperative management of the operative extremity, general facilitation of the case, and primary wound closure.  ANESTHESIA:  General.  SPECIMENS:  None.  COMPLICATIONS:  None.  BLOOD LOSS:  Noted to be 1700 mL.  INDICATIONS  FOR PROCEDURE:  The patient is a pleasant 75 year old male with longstanding challenges regarding his left hip.  He has had issues with primary total hip arthroplasty and subsequent revision for instability.  He was in his fairly normal  state of health approximately 4-5 weeks ago when he stumbled trying to get to the bathroom in the middle of the night and slammed into the wall landing directly onto his left hip.  He was seen and evaluated and recognized to have a greater trochanteric  femur fracture with displacement.  He was taken to the operating room approximately 2-3 weeks ago now.  At that point, based on the oblique nature of the fracture, I elected to apply cables over the segment that extended down into the proximal aspect of  the femur.  When he was first seen in followup, radiographs were obtained, which showed  displacement of the tray to the greater trochanter segment itself as opposed to the long oblique segment.  Given this, the indications for surgery were evident to try  to prevent any recurrent instability related to the gluteal weakness.  Risks of nonunion, malunion, need for future surgeries were discussed and reviewed.  Consent was obtained for benefit of pain relief as well as improved functionality.  DESCRIPTION OF PROCEDURE:  The patient was brought to the operative theater.  Once adequate anesthesia, preoperative antibiotics, Ancef administered, 3 g as well as tranexamic acid and Decadron, he was positioned into the right lateral decubitus position  with the left hip up.  The left lower extremity was then prepped and draped in sterile fashion.  A timeout was performed identifying the patient, planned procedure, and extremity.  His old incision was utilized for approach to the posterior aspect of  the hip.  Following initial dissection to the iliotibial band and gluteal fascia identified seroma fluid as well as fibrous tissue in this region.  I then excised the iliotibial band and gluteal fascia noting this to be significantly scarred.  Following  exposure of this lateral and posterior aspect of the hip, we identified the displaced greater trochanter segment.  I debrided a lot of the bone along the proximal aspect of the shaft as well as at the distal aspect of the displaced segment to identify  the anatomy what we had.  I debrided some of the undersurface of the trochanter to allow for it to be mobilized over the aspect of the proximal femur and the femoral component.  After a lot of this worked, I felt like I was able to get  this greater  trochanteric segment to a nearly anatomically aligned position based on what I could visualize intraoperatively.  I was able to hold this in place with a bone holding tenaculum.  I removed the old cables that were placed.  We then opened up a 4-hole  cable claw  plate from [employer].  I exposed the lateral shaft of the femur to allow for placement of this and placed this along the lateral aspect of the femur with the claw portion tamped into the tip of the trochanter.  When I had this in a reduced  fashion, I passed one cable in the second hole slot for a cable.  This was passed around the femur.  I did recognize challenge in passing the cables related to significant scarring in the proximal third of his hip from prior surgical intervention.  Once  I had the cable passed and through the plate, I again used a mallet, tamped the claw portion of the plate into the tip of the trochanter further reducing the fracture to a near anatomic position.  With this held in place, the cable was tensioned to the  lateral aspect of the femur.  With this in place and the fracture relatively stable at this point, I spent some time which was again a challenge in the proximal aspect of the hip passing the cable around a displaced lesser trochanteric segment as well as  around the greater trochanter segment.  Two cables were passed, one slightly inferior to the lesser trochanter and one above it.  I then was able to crimp these down further providing stability to the claw portion into the trochanter.  We then passed 2  other cables distally in the last 2 holes of the plate.  Once they were all passed, we went through and sequentially tightened and retensioned each cable before crimping and cutting.  At this point, the greater trochanteric segment appeared to be  anatomically positioned and was stable without obvious micromotion.  Once this was done, we irrigated the hip with normal saline solution with pulse lavage.  We reapproximated the iliotibial band and gluteal fascia using a combination of #1 Vicryl and #1  Stratafix suture.  The remainder of the wound was closed with 2-0 Vicryl and a running Monocryl stitch.  The hip was then cleaned, dried, and dressed sterilely using surgical glue  and Aquacel dressings.  He was then awoken from anesthesia and brought to  the recovery room in stable condition.  Postoperatively, I am going to have him be partial weightbearing with no active abduction for at least 6 weeks if not up to 3 months before we work on strengthening.  The healing and/or maintenance of the location of his trochanter will be important for  him overall for gluteal strength and protection against further instability.  IN/NUANCE  D:02/27/2020 T:02/27/2020 JOB:[id number]

## 2020-02-27 NOTE — Brief Op Note (Signed)
02/27/2020  3:21 PM  PATIENT:  Jacob Mitchell  75 y.o. male  PRE-OPERATIVE DIAGNOSIS:  Left displaced left greater trochanteric periprosthetic fracture with stable well fixed femoral component  POST-OPERATIVE DIAGNOSIS:  Left displaced left greater trochanteric periprosthetic fracture with stable well fixed femoral component  PROCEDURE:  Procedure(s) with comments: OPEN REDUCTION INTERNAL FIXATION (ORIF) LEFT PERI-PROSTETIC  HIP FRACTURE (Left) - 88mins  SURGEON:  Surgeon(s) and Role:    Paralee Cancel, MD - Primary  PHYSICIAN ASSISTANT: Danae Orleans, PA-C  ANESTHESIA:   general  EBL:  1700 mL   BLOOD ADMINISTERED:none  DRAINS: none   LOCAL MEDICATIONS USED:  NONE  SPECIMEN:  No Specimen  DISPOSITION OF SPECIMEN:  N/A  COUNTS:  YES  TOURNIQUET:  * No tourniquets in log *  DICTATION: .Other Dictation: Dictation Number [id number]  PLAN OF CARE: Admit to inpatient   PATIENT DISPOSITION:  PACU - hemodynamically stable.   Delay start of Pharmacological VTE agent (>24hrs) due to surgical blood loss or risk of bleeding: no

## 2020-02-27 NOTE — Anesthesia Postprocedure Evaluation (Signed)
Anesthesia Post Note  Patient: Jacob Mitchell  Procedure(s) Performed: OPEN REDUCTION INTERNAL FIXATION (ORIF) LEFT PERI-PROSTETIC  HIP FRACTURE (Left Hip)     Patient location during evaluation: PACU Anesthesia Type: General Level of consciousness: awake and alert Pain management: pain level controlled Vital Signs Assessment: post-procedure vital signs reviewed and stable Respiratory status: spontaneous breathing, nonlabored ventilation and respiratory function stable Cardiovascular status: blood pressure returned to baseline and stable Postop Assessment: no apparent nausea or vomiting Anesthetic complications: no   No complications documented.  Last Vitals:  Vitals:   02/27/20 1500 02/27/20 1515  BP: 132/64 (!) 146/68  Pulse: 65 73  Resp: 16 14  Temp:    SpO2: 97% 100%    Last Pain:  Vitals:   02/27/20 0921  TempSrc: Oral  PainSc: 0-No pain                 Lidia Collum

## 2020-02-27 NOTE — Anesthesia Procedure Notes (Signed)
Procedure Name: Intubation Date/Time: 02/27/2020 11:24 AM Performed by: Lollie Sails, CRNA Pre-anesthesia Checklist: Patient identified, Emergency Drugs available, Suction available, Patient being monitored and Timeout performed Patient Re-evaluated:Patient Re-evaluated prior to induction Oxygen Delivery Method: Circle system utilized Preoxygenation: Pre-oxygenation with 100% oxygen Induction Type: IV induction Ventilation: Oral airway inserted - appropriate to patient size and Two handed mask ventilation required Laryngoscope Size: Miller and 3 Grade View: Grade II Tube type: Oral Tube size: 8.0 mm Number of attempts: 1 Airway Equipment and Method: Stylet Placement Confirmation: ETT inserted through vocal cords under direct vision,  positive ETCO2 and breath sounds checked- equal and bilateral Secured at: 23 cm Tube secured with: Tape Dental Injury: Teeth and Oropharynx as per pre-operative assessment

## 2020-02-27 NOTE — Anesthesia Preprocedure Evaluation (Signed)
Anesthesia Evaluation  Patient identified by MRN, date of birth, ID band Patient awake    Reviewed: Allergy & Precautions, NPO status , Patient's Chart, lab work & pertinent test results  History of Anesthesia Complications Negative for: history of anesthetic complications  Airway Mallampati: III  TM Distance: >3 FB Neck ROM: Full    Dental  (+) Teeth Intact   Pulmonary Recent URI  (Recent COVID-19 infection 02/13/20- asymptomatic),    Pulmonary exam normal        Cardiovascular negative cardio ROS Normal cardiovascular exam     Neuro/Psych negative neurological ROS  negative psych ROS   GI/Hepatic negative GI ROS, Neg liver ROS,   Endo/Other  negative endocrine ROS  Renal/GU negative Renal ROS Bladder dysfunction (BPH w/ urinary retention)      Musculoskeletal  (+) Arthritis ,   Abdominal   Peds  Hematology  (+) anemia ,   Anesthesia Other Findings   Reproductive/Obstetrics                            Anesthesia Physical Anesthesia Plan  ASA: II  Anesthesia Plan: General   Post-op Pain Management:    Induction: Intravenous  PONV Risk Score and Plan: 3 and Ondansetron, Dexamethasone, Treatment may vary due to age or medical condition and Midazolam  Airway Management Planned: Oral ETT  Additional Equipment: None  Intra-op Plan:   Post-operative Plan: Extubation in OR  Informed Consent: I have reviewed the patients History and Physical, chart, labs and discussed the procedure including the risks, benefits and alternatives for the proposed anesthesia with the patient or authorized representative who has indicated his/her understanding and acceptance.     Dental advisory given  Plan Discussed with:   Anesthesia Plan Comments:         Anesthesia Quick Evaluation

## 2020-02-27 NOTE — Transfer of Care (Signed)
Immediate Anesthesia Transfer of Care Note  Patient: Jacob Mitchell  Procedure(s) Performed: OPEN REDUCTION INTERNAL FIXATION (ORIF) LEFT PERI-PROSTETIC  HIP FRACTURE (Left Hip)  Patient Location: PACU  Anesthesia Type:General  Level of Consciousness: awake and alert   Airway & Oxygen Therapy: Patient Spontanous Breathing and Patient connected to face mask oxygen  Post-op Assessment: Report given to RN, Post -op Vital signs reviewed and stable and patient trying to roll over in bed.  Pain meds given by PACU  Post vital signs: Reviewed and stable  Last Vitals:  Vitals Value Taken Time  BP 118/21 02/27/20 1452  Temp    Pulse 75 02/27/20 1456  Resp 19 02/27/20 1456  SpO2 100 % 02/27/20 1456  Vitals shown include unvalidated device data.  Last Pain:  Vitals:   02/27/20 0921  TempSrc: Oral  PainSc: 0-No pain      Patients Stated Pain Goal: 4 (73/53/29 9242)  Complications: No complications documented.

## 2020-02-27 NOTE — Interval H&P Note (Signed)
History and Physical Interval Note:  02/27/2020 9:15 AM  Jacob Mitchell  has presented today for surgery, with the diagnosis of Left displaced left great trochanter fracture.  The various methods of treatment have been discussed with the patient and family. After consideration of risks, benefits and other options for treatment, the patient has consented to  Procedure(s) with comments: OPEN REDUCTION INTERNAL FIXATION (ORIF) WITH POSSIBLE TROCHANTERIC ADVANCEMENT (Left) - 12mins as a surgical intervention.  The patient's history has been reviewed, patient examined, no change in status, stable for surgery.  I have reviewed the patient's chart and labs.  Questions were answered to the patient's satisfaction.     Mauri Pole

## 2020-02-28 LAB — CBC
HCT: 27.9 % — ABNORMAL LOW (ref 39.0–52.0)
Hemoglobin: 8.5 g/dL — ABNORMAL LOW (ref 13.0–17.0)
MCH: 27.2 pg (ref 26.0–34.0)
MCHC: 30.5 g/dL (ref 30.0–36.0)
MCV: 89.4 fL (ref 80.0–100.0)
Platelets: 213 10*3/uL (ref 150–400)
RBC: 3.12 MIL/uL — ABNORMAL LOW (ref 4.22–5.81)
RDW: 15 % (ref 11.5–15.5)
WBC: 9.8 10*3/uL (ref 4.0–10.5)
nRBC: 0 % (ref 0.0–0.2)

## 2020-02-28 LAB — BASIC METABOLIC PANEL
Anion gap: 9 (ref 5–15)
BUN: 29 mg/dL — ABNORMAL HIGH (ref 8–23)
CO2: 23 mmol/L (ref 22–32)
Calcium: 8.2 mg/dL — ABNORMAL LOW (ref 8.9–10.3)
Chloride: 103 mmol/L (ref 98–111)
Creatinine, Ser: 1.32 mg/dL — ABNORMAL HIGH (ref 0.61–1.24)
GFR, Estimated: 56 mL/min — ABNORMAL LOW (ref >60)
Glucose, Bld: 142 mg/dL — ABNORMAL HIGH (ref 70–99)
Potassium: 4.8 mmol/L (ref 3.5–5.1)
Sodium: 135 mmol/L (ref 135–145)

## 2020-02-28 NOTE — Plan of Care (Signed)
  Problem: Education: Goal: Knowledge of General Education information will improve Description Including pain rating scale, medication(s)/side effects and non-pharmacologic comfort measures Outcome: Progressing   

## 2020-02-28 NOTE — Evaluation (Signed)
Physical Therapy Evaluation Patient Details Name: Jacob Mitchell MRN: [id number] DOB: 22-Aug-1945 Today's Date: 02/28/2020   History of Present Illness  Pt is a 75 year old male s/p Open reduction internal fixation of left periprosthetic fx fracture with hx of L THA and posterior left hip revision  Clinical Impression  Pt admitted with above diagnosis.  amb ~ 80', RW min/guard assist. Feeling well, asymptomatic of decr Hgb  Pt currently with functional limitations due to the deficits listed below (see PT Problem List). Pt will benefit from skilled PT to increase their independence and safety with mobility to allow discharge to the venue listed below.       Follow Up Recommendations Follow surgeon's recommendation for DC plan and follow-up therapies    Equipment Recommendations  None recommended by PT    Recommendations for Other Services       Precautions / Restrictions Precautions Precautions: Other (comment);Fall Precaution Comments: no active hip abduction Restrictions Weight Bearing Restrictions: Yes LLE Weight Bearing: Partial weight bearing LLE Partial Weight Bearing Percentage or Pounds: 50%      Mobility  Bed Mobility Overal bed mobility: Needs Assistance Bed Mobility: Supine to Sit     Supine to sit: Mod assist     General bed mobility comments: assist to elevate trunk    Transfers Overall transfer level: Needs assistance Equipment used: Rolling walker (2 wheeled) Transfers: Sit to/from Stand Sit to Stand: Min assist;+2 safety/equipment;+2 physical assistance         General transfer comment: increased time and effort, cues for technique. pt prefers to pull up on PT and wife, this is what he does at home  Ambulation/Gait Ambulation/Gait assistance: Min guard Gait Distance (Feet): 80 Feet Assistive device: Rolling walker (2 wheeled) Gait Pattern/deviations: Step-to pattern;Decreased stance time - left     General Gait Details: verbal cues for  limiting WBing using RW, sequence  Stairs            Wheelchair Mobility    Modified Rankin (Stroke Patients Only)       Balance Overall balance assessment: Needs assistance         Standing balance support: No upper extremity supported Standing balance-Leahy Scale: Poor Standing balance comment: reliant on UEs                             Pertinent Vitals/Pain Pain Assessment: 0-10 Pain Score: 0-No pain Pain Location: left hip Pain Intervention(s): Limited activity within patient's tolerance;Monitored during session;Premedicated before session;Repositioned    Home Living Family/patient expects to be discharged to:: Private residence Living Arrangements: Spouse/significant other Available Help at Discharge: Family Type of Home: House Home Access: Level entry     Home Layout: One level Home Equipment: Environmental consultant - 2 wheels;Cane - single point      Prior Function Level of Independence: Independent               Hand Dominance        Extremity/Trunk Assessment   Upper Extremity Assessment Upper Extremity Assessment: Overall WFL for tasks assessed    Lower Extremity Assessment LLE Deficits / Details: grossly 2+ to 3/5. limited bypost op weakness       Communication   Communication: No difficulties  Cognition Arousal/Alertness: Awake/alert Behavior During Therapy: WFL for tasks assessed/performed Overall Cognitive Status: Within Functional Limits for tasks assessed  General Comments      Exercises General Exercises - Lower Extremity Long Arc Quad: AROM;Left;10 reps Heel Raises: 10 reps;AROM;Both   Assessment/Plan    PT Assessment Patient needs continued PT services  PT Problem List Decreased knowledge of use of DME;Decreased strength;Decreased mobility;Decreased knowledge of precautions;Decreased activity tolerance;Decreased balance       PT Treatment Interventions DME  instruction;Gait training;Stair training;Functional mobility training;Therapeutic activities;Therapeutic exercise;Patient/family education    PT Goals (Current goals can be found in the Care Plan section)  Acute Rehab PT Goals Patient Stated Goal: home PT Goal Formulation: With patient Time For Goal Achievement: 03/19/20 Potential to Achieve Goals: Good    Frequency 7X/week   Barriers to discharge        Co-evaluation               AM-PAC PT "6 Clicks" Mobility  Outcome Measure Help needed turning from your back to your side while in a flat bed without using bedrails?: A Little Help needed moving from lying on your back to sitting on the side of a flat bed without using bedrails?: A Little Help needed moving to and from a bed to a chair (including a wheelchair)?: A Little Help needed standing up from a chair using your arms (e.g., wheelchair or bedside chair)?: A Little Help needed to walk in hospital room?: A Little Help needed climbing 3-5 steps with a railing? : A Little 6 Click Score: 18    End of Session Equipment Utilized During Treatment: Gait belt Activity Tolerance: Patient tolerated treatment well Patient left: in chair;with call bell/phone within reach;with family/visitor present;with chair alarm set   PT Visit Diagnosis: Other abnormalities of gait and mobility (R26.89)    Time: 1030-1103 PT Time Calculation (min) (ACUTE ONLY): 33 min   Charges:   PT Evaluation $PT Eval Low Complexity: 1 Low PT Treatments $Gait Training: 8-22 mins        Baxter Flattery, PT  Acute Rehab Dept ([place]) [phone] Pager [phone]  02/28/2020   [facility] 02/28/2020, 11:54 AM

## 2020-02-28 NOTE — Progress Notes (Signed)
PT Cancellation Note  Patient Details Name: Jacob Mitchell MRN: [id number] DOB: 1946-01-20   Cancelled Treatment:    Reason Eval/Treat Not Completed: Other (comment). Attempted to see pt this pm, pt just back to bed, states he amb to and from bathroom, he denies dizziness.     [facility] 02/28/2020, 4:52 PM

## 2020-02-28 NOTE — Progress Notes (Signed)
Patient ID: Jacob Mitchell, male   DOB: Jan 26, 1946, 75 y.o.   MRN: [id number] Subjective: 1 Day Post-Op Procedure(s) (LRB): OPEN REDUCTION INTERNAL FIXATION (ORIF) LEFT PERI-PROSTETIC  HIP FRACTURE (Left)    Patient reports pain as mild to moderate  Objective:   VITALS:   Vitals:   02/28/20 0051 02/28/20 0501  BP: 116/66 (!) 105/48  Pulse: 79 84  Resp: 16 16  Temp: 97.6 F (36.4 C) (!) 97.5 F (36.4 C)  SpO2: 98% 99%    Neurovascular intact Incision: dressing C/D/I left hip  LABS Recent Labs    02/27/20 0950 02/27/20 1339 02/28/20 0339  HGB 11.8* 10.2* 8.5*  HCT 37.5* 30.0* 27.9*  WBC 6.3  --  9.8  PLT 219  --  213    Recent Labs    02/27/20 1339 02/28/20 0339  NA 138 135  K 4.2 4.8  BUN  --  29*  CREATININE  --  1.32*  GLUCOSE  --  142*    No results for input(s): LABPT, INR in the last 72 hours.   Assessment/Plan: 1 Day Post-Op Procedure(s) (LRB): OPEN REDUCTION INTERNAL FIXATION (ORIF) LEFT PERI-PROSTETIC  HIP FRACTURE (Left)   Advance diet Up with therapy, PWB LLE, no active abduction  ABLA - due to 2 recent procedures and current Hgb of 8.5 I will treat him with supplemental iron and plan to observe throughout today with therapy to see how he tolerates OOB activity Recheck Hgb in am Likely discharge tomorrow if he tolerates activity without orthostasis

## 2020-02-28 NOTE — Plan of Care (Signed)
Poc discussed with pt 

## 2020-02-29 ENCOUNTER — Other Ambulatory Visit (HOSPITAL_COMMUNITY): Payer: Self-pay | Admitting: Student

## 2020-02-29 LAB — BASIC METABOLIC PANEL
Anion gap: 8 (ref 5–15)
BUN: 34 mg/dL — ABNORMAL HIGH (ref 8–23)
CO2: 23 mmol/L (ref 22–32)
Calcium: 8.2 mg/dL — ABNORMAL LOW (ref 8.9–10.3)
Chloride: 102 mmol/L (ref 98–111)
Creatinine, Ser: 1.31 mg/dL — ABNORMAL HIGH (ref 0.61–1.24)
GFR, Estimated: 56 mL/min — ABNORMAL LOW (ref >60)
Glucose, Bld: 123 mg/dL — ABNORMAL HIGH (ref 70–99)
Potassium: 4.6 mmol/L (ref 3.5–5.1)
Sodium: 133 mmol/L — ABNORMAL LOW (ref 135–145)

## 2020-02-29 LAB — CBC
HCT: 23.4 % — ABNORMAL LOW (ref 39.0–52.0)
Hemoglobin: 7.3 g/dL — ABNORMAL LOW (ref 13.0–17.0)
MCH: 27.3 pg (ref 26.0–34.0)
MCHC: 31.2 g/dL (ref 30.0–36.0)
MCV: 87.6 fL (ref 80.0–100.0)
Platelets: 186 10*3/uL (ref 150–400)
RBC: 2.67 MIL/uL — ABNORMAL LOW (ref 4.22–5.81)
RDW: 15.4 % (ref 11.5–15.5)
WBC: 12.4 10*3/uL — ABNORMAL HIGH (ref 4.0–10.5)
nRBC: 0 % (ref 0.0–0.2)

## 2020-02-29 MED ORDER — FERROUS SULFATE 325 (65 FE) MG PO TABS: 325.0000 mg | ORAL_TABLET | Freq: Three times a day (TID) | ORAL | 0 refills | Status: DC

## 2020-02-29 MED ORDER — HYDROCODONE-ACETAMINOPHEN 7.5-325 MG PO TABS: 1.0000 | ORAL_TABLET | Freq: Four times a day (QID) | ORAL | 0 refills | Status: DC | PRN

## 2020-02-29 MED ORDER — DOCUSATE SODIUM 100 MG PO CAPS: 100.0000 mg | ORAL_CAPSULE | Freq: Two times a day (BID) | ORAL | Status: DC

## 2020-02-29 MED ORDER — CELECOXIB 200 MG PO CAPS: 200.0000 mg | ORAL_CAPSULE | Freq: Two times a day (BID) | ORAL | 0 refills | Status: DC

## 2020-02-29 MED ORDER — ASPIRIN 81 MG PO CHEW: 81.0000 mg | CHEWABLE_TABLET | Freq: Two times a day (BID) | ORAL | 0 refills | Status: DC

## 2020-02-29 MED ORDER — POLYETHYLENE GLYCOL 3350 17 G PO PACK: 17.0000 g | PACK | Freq: Two times a day (BID) | ORAL | 0 refills | Status: DC

## 2020-02-29 MED ORDER — METHOCARBAMOL 500 MG PO TABS: 500.0000 mg | ORAL_TABLET | Freq: Four times a day (QID) | ORAL | 0 refills | Status: DC | PRN

## 2020-02-29 MED FILL — POLYETHYLENE GLYCOL 3350 17: 17 | 15 days supply | Qty: 510 | Fill #0

## 2020-02-29 MED FILL — HYDROCODON-APAP 7.5-325: 7.5-325 | 7 days supply | Qty: 42 | Fill #0

## 2020-02-29 MED FILL — METHOCARBAMOL 500 MG TABLET: 500 | 10 days supply | Qty: 40 | Fill #0

## 2020-02-29 MED FILL — ASPIRIN LOW DOSE 81 MG CHEW: 81 | 28 days supply | Qty: 56 | Fill #0

## 2020-02-29 MED FILL — CELECOXIB 200 MG CAP: 200 | 30 days supply | Qty: 60 | Fill #0

## 2020-02-29 NOTE — Plan of Care (Signed)
Discussed poc with pt for discharge

## 2020-02-29 NOTE — Progress Notes (Signed)
Subjective: 2 Days Post-Op Procedure(s) (LRB): OPEN REDUCTION INTERNAL FIXATION (ORIF) LEFT PERI-PROSTETIC  HIP FRACTURE (Left) Patient reports pain as mild.   Patient seen in rounds with Dr. Alvan Dame. Patient is well, and has had no acute complaints or problems other than discomfort in the left hip. Resting in bed with his wife at the bedside this AM. Ambulated 90 feet this morning with PT without fatigue, dizziness, or other issues. Patient states he is ready to go home.  We will continue therapy today.   Objective: Vital signs in last 24 hours: Temp:  [97.7 F (36.5 C)-98.8 F (37.1 C)] 97.7 F (36.5 C) (01/19 0527) Pulse Rate:  [71-93] 71 (01/19 0527) Resp:  [16-17] 16 (01/19 0527) BP: (108-146)/(50-68) 108/50 (01/19 0527) SpO2:  [93 %-96 %] 96 % (01/19 0527)  Intake/Output from previous day:  Intake/Output Summary (Last 24 hours) at 02/29/2020 1200 Last data filed at 02/29/2020 0815 Gross per 24 hour  Intake 1081.67 ml  Output 2125 ml  Net -1043.33 ml     Intake/Output this shift: Total I/O In: 300 [P.O.:300] Out: 300 [Urine:300]  Labs: Recent Labs    02/27/20 0950 02/27/20 1339 02/28/20 0339 02/29/20 0333  HGB 11.8* 10.2* 8.5* 7.3*   Recent Labs    02/28/20 0339 02/29/20 0333  WBC 9.8 12.4*  RBC 3.12* 2.67*  HCT 27.9* 23.4*  PLT 213 186   Recent Labs    02/28/20 0339 02/29/20 0333  NA 135 133*  K 4.8 4.6  CL 103 102  CO2 23 23  BUN 29* 34*  CREATININE 1.32* 1.31*  GLUCOSE 142* 123*  CALCIUM 8.2* 8.2*   No results for input(s): LABPT, INR in the last 72 hours.  Exam: General - Patient is Alert and Oriented Extremity - Neurologically intact Sensation intact distally Intact pulses distally Dorsiflexion/Plantar flexion intact Dressing - dressing C/D/I Motor Function - intact, moving foot and toes well on exam.   Past Medical History:  Diagnosis Date  . Benign localized prostatic hyperplasia with lower urinary tract symptoms (LUTS)   .  Complication of anesthesia    pt says he gets "restless" when he is waking up and nurses have had to "hold him down"  . Diverticulosis of colon   . History of colon polyps   . History of kidney stones   . History of MRSA infection 2007   post right shoulder surgery  . History of urinary retention 05/31/2017   due to BPH  . Incomplete emptying of bladder   . OA (osteoarthritis)   . Peripheral neuropathy    feet-- hx frost bite  . Pinched nerve in neck    right index and middle fingers numbness  . Wears glasses   . Wears hearing aid in both ears     Assessment/Plan: 2 Days Post-Op Procedure(s) (LRB): OPEN REDUCTION INTERNAL FIXATION (ORIF) LEFT PERI-PROSTETIC  HIP FRACTURE (Left) Active Problems:   Greater trochanter fracture (HCC)  Estimated body mass index is 35.22 kg/m as calculated from the following:   Height as of this encounter: 6\' 7"  (2.007 m).   Weight as of this encounter: 141.8 kg. Advance diet Up with therapy D/C IV fluids  DVT Prophylaxis - Aspirin PWB LLE, avoid active abduction Hip precautions discussed with patient  Plan is to go Home after hospital stay.   Hemoglobin of 7.3 this AM, down from 8.5 yesterday, secondary to ABLA. He is asymptomatic at this point, and was able to ambulate 90 feet this morning without dizziness  or fatigue. We will keep him on Iron for 2 weeks post-operatively. He was instructed to ambulate with a walker at all times and be careful when getting up from bed/seated position.   Plan to discharge home today as long as he continues to meet his goals. Follow up in the office in 2 weeks.   Griffith Citron, PA-C Orthopedic Surgery [phone] 02/29/2020, 12:00 PM

## 2020-02-29 NOTE — Progress Notes (Signed)
Physical Therapy Treatment Patient Details Name: Jacob Mitchell MRN: [id number] DOB: Jun 14, 1945 Today's Date: 02/29/2020    History of Present Illness Pt is a 75 year old male s/p Open reduction internal fixation of left periprosthetic fx fracture with hx of L THA and posterior left hip revision    PT Comments    Pt with Hgb 7.3 today. Continues to amb ~ 75' with RW and supervision, denies fatigue, dizziness, etc.  Pt states he is ready to go home.  Follow Up Recommendations  Follow surgeon's recommendation for DC plan and follow-up therapies     Equipment Recommendations  None recommended by PT    Recommendations for Other Services       Precautions / Restrictions Precautions Precautions: Other (comment);Fall Precaution Comments: no active hip abduction Restrictions Weight Bearing Restrictions: Yes LLE Weight Bearing: Partial weight bearing LLE Partial Weight Bearing Percentage or Pounds: 50%    Mobility  Bed Mobility Overal bed mobility: Needs Assistance Bed Mobility: Sit to Supine       Sit to supine: Supervision   General bed mobility comments: cues for avoiding L hip abduction, instructed in use of gait belt  leg lifter assist from wife as needed  Transfers Overall transfer level: Needs assistance Equipment used: Rolling walker (2 wheeled) Transfers: Sit to/from Stand Sit to Stand: Min assist;Min guard         General transfer comment: incr time and effort however pt able to push up from recliner with min to min/guard assist  Ambulation/Gait Ambulation/Gait assistance: Min guard;Supervision Gait Distance (Feet): 90 Feet Assistive device: Rolling walker (2 wheeled) Gait Pattern/deviations: Step-to pattern;Decreased stance time - left     General Gait Details: verbal cues for limiting WBing using RW, sequence, posture. pt tends to flex at hips, leaning forward to keep wt on RW   Stairs             Wheelchair Mobility    Modified Rankin  (Stroke Patients Only)       Balance             Standing balance-Leahy Scale: Fair                              Cognition Arousal/Alertness: Awake/alert Behavior During Therapy: WFL for tasks assessed/performed Overall Cognitive Status: Within Functional Limits for tasks assessed                                 General Comments: pt dressed an in chair      Exercises      General Comments        Pertinent Vitals/Pain Pain Assessment: No/denies pain    Home Living                      Prior Function            PT Goals (current goals can now be found in the care plan section) Acute Rehab PT Goals Patient Stated Goal: home PT Goal Formulation: With patient Time For Goal Achievement: 03/19/20 Potential to Achieve Goals: Good Progress towards PT goals: Progressing toward goals    Frequency    7X/week      PT Plan Current plan remains appropriate    Co-evaluation              AM-PAC PT "6 Clicks" Mobility  Outcome Measure  Help needed turning from your back to your side while in a flat bed without using bedrails?: A Little Help needed moving from lying on your back to sitting on the side of a flat bed without using bedrails?: A Little Help needed moving to and from a bed to a chair (including a wheelchair)?: A Little Help needed standing up from a chair using your arms (e.g., wheelchair or bedside chair)?: A Little Help needed to walk in hospital room?: A Little Help needed climbing 3-5 steps with a railing? : A Little 6 Click Score: 18    End of Session Equipment Utilized During Treatment: Gait belt Activity Tolerance: Patient tolerated treatment well Patient left: Other (comment);with call bell/phone within reach (EOB) Nurse Communication: Mobility status PT Visit Diagnosis: Other abnormalities of gait and mobility (R26.89)     Time: 1010-1032 PT Time Calculation (min) (ACUTE ONLY): 22  min  Charges:  $Gait Training: 8-22 mins                     Baxter Flattery, PT  Acute Rehab Dept ([place]) [phone] Pager [phone]  02/29/2020    [facility] 02/29/2020, 11:24 AM

## 2020-03-01 ENCOUNTER — Encounter (HOSPITAL_COMMUNITY): Payer: Self-pay | Admitting: Orthopedic Surgery

## 2020-03-03 DIAGNOSIS — S71102A Unspecified open wound, left thigh, initial encounter: Secondary | ICD-10-CM | POA: Diagnosis not present

## 2020-03-05 DIAGNOSIS — Z96642 Presence of left artificial hip joint: Secondary | ICD-10-CM | POA: Diagnosis not present

## 2020-03-05 DIAGNOSIS — S7002XS Contusion of left hip, sequela: Secondary | ICD-10-CM | POA: Diagnosis not present

## 2020-03-06 NOTE — Discharge Summary (Signed)
Physician Discharge Summary   Patient ID: Jacob Mitchell MRN: [id number] DOB/AGE: 1945/05/31 75 y.o.  Admit date: 02/27/2020 Discharge date: 02/29/2020  Primary Diagnosis: Displaced left greater trochanteric periprosthetic femur fracture with stable femoral component.  Admission Diagnoses:  Past Medical History:  Diagnosis Date  . Benign localized prostatic hyperplasia with lower urinary tract symptoms (LUTS)   . Complication of anesthesia    pt says he gets "restless" when he is waking up and nurses have had to "hold him down"  . Diverticulosis of colon   . History of colon polyps   . History of kidney stones   . History of MRSA infection 2007   post right shoulder surgery  . History of urinary retention 05/31/2017   due to BPH  . Incomplete emptying of bladder   . OA (osteoarthritis)   . Peripheral neuropathy    feet-- hx frost bite  . Pinched nerve in neck    right index and middle fingers numbness  . Wears glasses   . Wears hearing aid in both ears    Discharge Diagnoses:   Active Problems:   Greater trochanter fracture (HCC)  Estimated body mass index is 35.22 kg/m as calculated from the following:   Height as of this encounter: 6\' 7"  (2.007 m).   Weight as of this encounter: 141.8 kg.  Procedure:  Procedure(s) (LRB): OPEN REDUCTION INTERNAL FIXATION (ORIF) LEFT PERI-PROSTETIC  HIP FRACTURE (Left)   Consults: None  HPI: The patient is a pleasant 75 year old male with longstanding challenges regarding his left hip.  He has had issues with primary total hip arthroplasty and subsequent revision for instability.  He was in his fairly normal  state of health approximately 4-5 weeks ago when he stumbled trying to get to the bathroom in the middle of the night and slammed into the wall landing directly onto his left hip.  He was seen and evaluated and recognized to have a greater trochanteric  femur fracture with displacement.  He was taken to the operating room  approximately 2-3 weeks ago now.  At that point, based on the oblique nature of the fracture, I elected to apply cables over the segment that extended down into the proximal aspect of  the femur.  When he was first seen in followup, radiographs were obtained, which showed displacement of the tray to the greater trochanter segment itself as opposed to the long oblique segment.  Given this, the indications for surgery were evident to try  to prevent any recurrent instability related to the gluteal weakness.  Risks of nonunion, malunion, need for future surgeries were discussed and reviewed.  Consent was obtained for benefit of pain relief as well as improved functionality.  Laboratory Data: Admission on 02/27/2020, Discharged on 02/29/2020  Component Date Value Ref Range Status  . WBC 02/27/2020 6.3  4.0 - 10.5 K/uL Final  . RBC 02/27/2020 4.34  4.22 - 5.81 MIL/uL Final  . Hemoglobin 02/27/2020 11.8* 13.0 - 17.0 g/dL Final  . HCT 02/27/2020 37.5* 39.0 - 52.0 % Final  . MCV 02/27/2020 86.4  80.0 - 100.0 fL Final  . MCH 02/27/2020 27.2  26.0 - 34.0 pg Final  . MCHC 02/27/2020 31.5  30.0 - 36.0 g/dL Final  . RDW 02/27/2020 14.8  11.5 - 15.5 % Final  . Platelets 02/27/2020 219  150 - 400 K/uL Final  . nRBC 02/27/2020 0.0  0.0 - 0.2 % Final   Performed at [facility] [address]  [place], [place] [address]  . ABO/RH(D) 02/27/2020 O POS   Final  . Antibody Screen 02/27/2020 NEG   Final  . Sample Expiration 02/27/2020    Final                   Value:03/01/2020,2359 Performed at [facility], [place] [address], [place], [place] [address]   . MRSA, PCR 02/27/2020 NEGATIVE  NEGATIVE Final  . Staphylococcus aureus 02/27/2020 NEGATIVE  NEGATIVE Final   Comment: (NOTE) The Xpert SA Assay (FDA approved for NASAL specimens in patients 20 years of age and older), is one component of a comprehensive surveillance program. It is not intended to diagnose infection nor  to guide or monitor treatment. Performed at [facility], [place] [address], [place], [place] [address]   . pH, Ven 02/27/2020 7.307  7.250 - 7.430 Final  . pCO2, Ven 02/27/2020 50.0  44.0 - 60.0 mmHg Final  . pO2, Ven 02/27/2020 65.0* 32.0 - 45.0 mmHg Final  . Bicarbonate 02/27/2020 25.5  20.0 - 28.0 mmol/L Final  . TCO2 02/27/2020 27  22 - 32 mmol/L Final  . O2 Saturation 02/27/2020 92.0  % Final  . Acid-base deficit 02/27/2020 2.0  0.0 - 2.0 mmol/L Final  . Sodium 02/27/2020 138  135 - 145 mmol/L Final  . Potassium 02/27/2020 4.2  3.5 - 5.1 mmol/L Final  . Calcium, Ion 02/27/2020 1.38  1.15 - 1.40 mmol/L Final  . HCT 02/27/2020 30.0* 39.0 - 52.0 % Final  . Hemoglobin 02/27/2020 10.2* 13.0 - 17.0 g/dL Final  . Patient temperature 02/27/2020 35.4 C   Final  . Sample type 02/27/2020 VENOUS   Final  . WBC 02/28/2020 9.8  4.0 - 10.5 K/uL Final  . RBC 02/28/2020 3.12* 4.22 - 5.81 MIL/uL Final  . Hemoglobin 02/28/2020 8.5* 13.0 - 17.0 g/dL Final   Comment: REPEATED TO VERIFY DLTA   . HCT 02/28/2020 27.9* 39.0 - 52.0 % Final  . MCV 02/28/2020 89.4  80.0 - 100.0 fL Final  . MCH 02/28/2020 27.2  26.0 - 34.0 pg Final  . MCHC 02/28/2020 30.5  30.0 - 36.0 g/dL Final  . RDW 02/28/2020 15.0  11.5 - 15.5 % Final  . Platelets 02/28/2020 213  150 - 400 K/uL Final  . nRBC 02/28/2020 0.0  0.0 - 0.2 % Final   Performed at [facility], [place] [address], [place], [place] [address]  . Sodium 02/28/2020 135  135 - 145 mmol/L Final  . Potassium 02/28/2020 4.8  3.5 - 5.1 mmol/L Final  . Chloride 02/28/2020 103  98 - 111 mmol/L Final  . CO2 02/28/2020 23  22 - 32 mmol/L Final  . Glucose, Bld 02/28/2020 142* 70 - 99 mg/dL Final   Glucose reference range applies only to samples taken after fasting for at least 8 hours.  . BUN 02/28/2020 29* 8 - 23 mg/dL Final  . Creatinine, Ser 02/28/2020 1.32* 0.61 - 1.24 mg/dL Final  . Calcium 02/28/2020 8.2* 8.9 - 10.3 mg/dL Final   . GFR, Estimated 02/28/2020 56* >60 mL/min Final   Comment: (NOTE) Calculated using the CKD-EPI Creatinine Equation (2021)   . Anion gap 02/28/2020 9  5 - 15 Final   Performed at [facility], [place] [address], [place],  [address]  . WBC 02/29/2020 12.4* 4.0 - 10.5 K/uL Final  . RBC 02/29/2020 2.67* 4.22 - 5.81 MIL/uL Final  . Hemoglobin 02/29/2020 7.3* 13.0 - 17.0 g/dL Final  .  HCT 02/29/2020 23.4* 39.0 - 52.0 % Final  . MCV 02/29/2020 87.6  80.0 - 100.0 fL Final  . MCH 02/29/2020 27.3  26.0 - 34.0 pg Final  . MCHC 02/29/2020 31.2  30.0 - 36.0 g/dL Final  . RDW 02/29/2020 15.4  11.5 - 15.5 % Final  . Platelets 02/29/2020 186  150 - 400 K/uL Final  . nRBC 02/29/2020 0.0  0.0 - 0.2 % Final   Performed at [facility], [place] [address], [place], [place] [address]  . Sodium 02/29/2020 133* 135 - 145 mmol/L Final  . Potassium 02/29/2020 4.6  3.5 - 5.1 mmol/L Final  . Chloride 02/29/2020 102  98 - 111 mmol/L Final  . CO2 02/29/2020 23  22 - 32 mmol/L Final  . Glucose, Bld 02/29/2020 123* 70 - 99 mg/dL Final   Glucose reference range applies only to samples taken after fasting for at least 8 hours.  . BUN 02/29/2020 34* 8 - 23 mg/dL Final  . Creatinine, Ser 02/29/2020 1.31* 0.61 - 1.24 mg/dL Final  . Calcium 02/29/2020 8.2* 8.9 - 10.3 mg/dL Final  . GFR, Estimated 02/29/2020 56* >60 mL/min Final   Comment: (NOTE) Calculated using the CKD-EPI Creatinine Equation (2021)   . Anion gap 02/29/2020 8  5 - 15 Final   Performed at [facility], [place] [address] [place], [place] [address]  Hospital Outpatient Visit on 02/13/2020  Component Date Value Ref Range Status  . SARS Coronavirus 2 02/13/2020 POSITIVE* NEGATIVE Final   Comment: (NOTE) SARS-CoV-2 target nucleic acids are DETECTED.  The SARS-CoV-2 RNA is generally detectable in upper and lower respiratory specimens during the acute phase of infection. Positive results are  indicative of the presence of SARS-CoV-2 RNA. Clinical correlation with patient history and other diagnostic information is  necessary to determine patient infection status. Positive results do not rule out bacterial infection or co-infection with other viruses.  The expected result is Negative.  Fact Sheet for Patients: SugarRoll.be  Fact Sheet for Healthcare Providers: https://www.woods-mathews.com/  This test is not yet approved or cleared by the [place] FDA and  has been authorized for detection and/or diagnosis of SARS-CoV-2 by FDA under an Emergency Use Authorization (EUA). This EUA will remain  in effect (meaning this test can be used) for the duration of the COVID-19 declaration under Section 564(b)(1) of the Act, 21 U.                          S.C. section 360bbb-3(b)(1), unless the authorization is terminated or revoked sooner.   Performed at [facility], [place] [address], [place], [place] [address]   Hospital Outpatient Visit on 02/07/2020  Component Date Value Ref Range Status  . WBC 02/07/2020 6.6  4.0 - 10.5 K/uL Final  . RBC 02/07/2020 3.68* 4.22 - 5.81 MIL/uL Final  . Hemoglobin 02/07/2020 10.3* 13.0 - 17.0 g/dL Final  . HCT 02/07/2020 33.6* 39.0 - 52.0 % Final  . MCV 02/07/2020 91.3  80.0 - 100.0 fL Final  . MCH 02/07/2020 28.0  26.0 - 34.0 pg Final  . MCHC 02/07/2020 30.7  30.0 - 36.0 g/dL Final  . RDW 02/07/2020 14.7  11.5 - 15.5 % Final  . Platelets 02/07/2020 324  150 - 400 K/uL Final  . nRBC 02/07/2020 0.0  0.0 - 0.2 % Final   Performed at [facility], [place] [address], [place], [place] [address]  Admission on  01/17/2020, Discharged on 01/19/2020  Component Date Value Ref Range Status  . ABO/RH(D) 01/17/2020 O POS   Final  . Antibody Screen 01/17/2020 NEG   Final  . Sample Expiration 01/17/2020    Final                   Value:01/20/2020,2359 Performed at [facility], [place] [address] [place], [place] [address]   . WBC 01/17/2020 8.4  4.0 - 10.5 K/uL Final  . RBC 01/17/2020 3.92* 4.22 - 5.81 MIL/uL Final  . Hemoglobin 01/17/2020 11.5* 13.0 - 17.0 g/dL Final  . HCT 01/17/2020 36.0* 39.0 - 52.0 % Final  . MCV 01/17/2020 91.8  80.0 - 100.0 fL Final  . MCH 01/17/2020 29.3  26.0 - 34.0 pg Final  . MCHC 01/17/2020 31.9  30.0 - 36.0 g/dL Final  . RDW 01/17/2020 13.9  11.5 - 15.5 % Final  . Platelets 01/17/2020 299  150 - 400 K/uL Final  . nRBC 01/17/2020 0.0  0.0 - 0.2 % Final   Performed at [facility], [place] [address] [place], [place] [address]  . Sodium 01/17/2020 137  135 - 145 mmol/L Final  . Potassium 01/17/2020 4.0  3.5 - 5.1 mmol/L Final  . Chloride 01/17/2020 104  98 - 111 mmol/L Final  . CO2 01/17/2020 24  22 - 32 mmol/L Final  . Glucose, Bld 01/17/2020 96  70 - 99 mg/dL Final   Glucose reference range applies only to samples taken after fasting for at least 8 hours.  . BUN 01/17/2020 28* 8 - 23 mg/dL Final  . Creatinine, Ser 01/17/2020 1.07  0.61 - 1.24 mg/dL Final  . Calcium 01/17/2020 8.9  8.9 - 10.3 mg/dL Final  . GFR, Estimated 01/17/2020 >60  >60 mL/min Final   Comment: (NOTE) Calculated using the CKD-EPI Creatinine Equation (2021)   . Anion gap 01/17/2020 9  5 - 15 Final   Performed at [facility], [place] [address] [place], [place] [address]  . Prothrombin Time 01/17/2020 13.5  11.4 - 15.2 seconds Final  . INR 01/17/2020 1.1  0.8 - 1.2 Final   Comment: (NOTE) INR goal varies based on device and disease states. Performed at [facility], [place] [address] [place], [place] [address]   . aPTT 01/17/2020 32  24 - 36 seconds Final   Performed at [facility], [place] [address], [place], [place] [address]  . Sodium 01/17/2020 138  135 - 145 mmol/L Final  . Potassium 01/17/2020 4.2  3.5 - 5.1 mmol/L Final  . Chloride 01/17/2020 103  98 - 111 mmol/L Final  .  BUN 01/17/2020 25* 8 - 23 mg/dL Final  . Creatinine, Ser 01/17/2020 1.00  0.61 - 1.24 mg/dL Final  . Glucose, Bld 01/17/2020 108* 70 - 99 mg/dL Final   Glucose reference range applies only to samples taken after fasting for at least 8 hours.  . Calcium, Ion 01/17/2020 1.29  1.15 - 1.40 mmol/L Final  . TCO2 01/17/2020 28  22 - 32 mmol/L Final  . Hemoglobin 01/17/2020 10.5* 13.0 - 17.0 g/dL Final  . HCT 01/17/2020 31.0* 39.0 - 52.0 % Final  . Sodium 01/17/2020 138  135 - 145 mmol/L Final  . Potassium 01/17/2020 4.2  3.5 - 5.1 mmol/L Final  . Chloride 01/17/2020 103  98 - 111 mmol/L Final  . BUN 01/17/2020 26* 8 - 23 mg/dL Final  . Creatinine, Ser 01/17/2020 1.00  0.61 - 1.24 mg/dL Final  . Glucose, Bld 01/17/2020 112* 70 - 99 mg/dL Final   Glucose reference range applies only to samples taken after fasting for at least 8 hours.  . Calcium, Ion 01/17/2020 1.24  1.15 - 1.40 mmol/L Final  . TCO2 01/17/2020 24  22 - 32 mmol/L Final  . Hemoglobin 01/17/2020 10.9* 13.0 - 17.0 g/dL Final  . HCT 01/17/2020 32.0* 39.0 - 52.0 % Final  . WBC 01/18/2020 9.2  4.0 - 10.5 K/uL Final  . RBC 01/18/2020 3.16* 4.22 - 5.81 MIL/uL Final  . Hemoglobin 01/18/2020 9.2* 13.0 - 17.0 g/dL Final  . HCT 01/18/2020 29.4* 39.0 - 52.0 % Final  . MCV 01/18/2020 93.0  80.0 - 100.0 fL Final  . MCH 01/18/2020 29.1  26.0 - 34.0 pg Final  . MCHC 01/18/2020 31.3  30.0 - 36.0 g/dL Final  . RDW 01/18/2020 13.5  11.5 - 15.5 % Final  . Platelets 01/18/2020 277  150 - 400 K/uL Final  . nRBC 01/18/2020 0.0  0.0 - 0.2 % Final   Performed at [facility], [place] [address], [place], [place] [address]  . Sodium 01/18/2020 134* 135 - 145 mmol/L Final  . Potassium 01/18/2020 4.5  3.5 - 5.1 mmol/L Final  . Chloride 01/18/2020 103  98 - 111 mmol/L Final  . CO2 01/18/2020 21* 22 - 32 mmol/L Final  . Glucose, Bld 01/18/2020 148* 70 - 99 mg/dL Final   Glucose reference range applies only to samples taken after fasting for  at least 8 hours.  . BUN 01/18/2020 24* 8 - 23 mg/dL Final  . Creatinine, Ser 01/18/2020 1.06  0.61 - 1.24 mg/dL Final  . Calcium 01/18/2020 8.1* 8.9 - 10.3 mg/dL Final  . GFR, Estimated 01/18/2020 >60  >60 mL/min Final   Comment: (NOTE) Calculated using the CKD-EPI Creatinine Equation (2021)   . Anion gap 01/18/2020 10  5 - 15 Final   Performed at [facility], [place] [address] [place], [place] [address]  . WBC 01/19/2020 7.3  4.0 - 10.5 K/uL Final  . RBC 01/19/2020 2.87* 4.22 - 5.81 MIL/uL Final  . Hemoglobin 01/19/2020 8.4* 13.0 - 17.0 g/dL Final  . HCT 01/19/2020 26.5* 39.0 - 52.0 % Final  . MCV 01/19/2020 92.3  80.0 - 100.0 fL Final  . MCH 01/19/2020 29.3  26.0 - 34.0 pg Final  . MCHC 01/19/2020 31.7  30.0 - 36.0 g/dL Final  . RDW 01/19/2020 13.8  11.5 - 15.5 % Final  . Platelets 01/19/2020 230  150 - 400 K/uL Final  . nRBC 01/19/2020 0.0  0.0 - 0.2 % Final   Performed at [facility], [place] [address] [place], [place] [address]  . Sodium 01/19/2020 135  135 - 145 mmol/L Final  . Potassium 01/19/2020 3.9  3.5 - 5.1 mmol/L Final  . Chloride 01/19/2020 104  98 - 111 mmol/L Final  . CO2 01/19/2020 24  22 - 32 mmol/L Final  . Glucose, Bld 01/19/2020 104* 70 - 99 mg/dL Final   Glucose reference range applies only to samples taken after fasting for at least 8 hours.  . BUN 01/19/2020 35* 8 - 23 mg/dL Final  . Creatinine, Ser 01/19/2020 1.14  0.61 - 1.24 mg/dL Final  . Calcium 01/19/2020 8.1* 8.9 - 10.3 mg/dL Final  . GFR, Estimated 01/19/2020 >60  >60 mL/min Final   Comment: (NOTE) Calculated using the CKD-EPI Creatinine Equation (2021)   . Anion  gap 01/19/2020 7  5 - 15 Final   Performed at [facility], [place] [address] [place], [place] [address]  Hospital Outpatient Visit on 01/14/2020  Component Date Value Ref Range Status  . SARS Coronavirus 2 01/14/2020 NEGATIVE  NEGATIVE Final   Comment: (NOTE) SARS-CoV-2 target  nucleic acids are NOT DETECTED.  The SARS-CoV-2 RNA is generally detectable in upper and lower respiratory specimens during the acute phase of infection. Negative results do not preclude SARS-CoV-2 infection, do not rule out co-infections with other pathogens, and should not be used as the sole basis for treatment or other patient management decisions. Negative results must be combined with clinical observations, patient history, and epidemiological information. The expected result is Negative.  Fact Sheet for Patients: SugarRoll.be  Fact Sheet for Healthcare Providers: https://www.woods-mathews.com/  This test is not yet approved or cleared by the [place] FDA and  has been authorized for detection and/or diagnosis of SARS-CoV-2 by FDA under an Emergency Use Authorization (EUA). This EUA will remain  in effect (meaning this test can be used) for the duration of the COVID-19 declaration under Se                          ction 564(b)(1) of the Act, 21 U.S.C. section 360bbb-3(b)(1), unless the authorization is terminated or revoked sooner.  Performed at [facility], [place] [address] [place], [place] [address]      X-Rays:DG Pelvis Portable  Result Date: 02/27/2020 CLINICAL DATA:  Status post total hip replacement EXAM: LEFT HIP: 2 VIEWS COMPARISON:  January 17, 2020 FINDINGS: Frontal and lateral views were obtained. There is a total hip replacement on the left with prosthetic components well-seated. No fracture or dislocation. Moderate narrowing right hip joint. Visualized sacroiliac joints appear unremarkable. IMPRESSION: Total hip replacement on the left with prosthetic components well-seated. No acute fracture or dislocation. Narrowing right hip joint. Electronically Signed   By: Lowella Grip III M.D.   On: 02/27/2020 15:15    EKG:No orders found for this or any previous visit.   Hospital Course: PRESTIN KUPERMAN is a 75 y.o.  who was admitted to [facility]. They were brought to the operating room on 02/27/2020 and underwent Procedure(s): OPEN REDUCTION INTERNAL FIXATION (ORIF) LEFT PERI-PROSTETIC  HIP FRACTURE.  Patient tolerated the procedure well and was later transferred to the recovery room and then to the orthopaedic floor for postoperative care. They were given PO and IV analgesics for pain control following their surgery. They were given 24 hours of postoperative antibiotics of  Anti-infectives (From admission, onward)   Start     Dose/Rate Route Frequency Ordered Stop   02/27/20 1800  ceFAZolin (ANCEF) IVPB 2g/100 mL premix        2 g 200 mL/hr over 30 Minutes Intravenous Every 6 hours 02/27/20 1728 02/28/20 0256   02/27/20 0915  ceFAZolin (ANCEF) IVPB 2g/100 mL premix  Status:  Discontinued        2 g 200 mL/hr over 30 Minutes Intravenous On call to O.R. 02/27/20 0905 02/27/20 1135   02/27/20 0915  ceFAZolin (ANCEF) 3 g in dextrose 5 % 50 mL IVPB  Status:  Discontinued        3 g 100 mL/hr over 30 Minutes Intravenous On call to O.R. 02/27/20 [id number] 02/27/20 1135     and started on DVT prophylaxis in the form of Aspirin.   PT and OT were ordered for  total joint protocol. Discharge planning consulted to help with postop disposition and equipment needs. Patient had a good night on the evening of surgery. They started to get up OOB with therapy on POD #1.Continued to work with therapy into POD #2. Pt was seen during rounds on day two and was ready to go home pending progress with therapy. Dressing was clean dry and intact.  Pt worked with therapy for one additional session and was meeting their goals. He was discharged to home later that day in stable condition.  Diet: Regular diet Activity: WBAT, no active abduction  Follow-up: in 2 weeks Disposition: Home Discharged Condition: good   Discharge Instructions    Call MD / Call 911   Complete by: As directed    If you experience chest pain or shortness  of breath, CALL 911 and be transported to the hospital emergency room.  If you develope a fever above 101 F, pus (white drainage) or increased drainage or redness at the wound, or calf pain, call your surgeon's office.   Change dressing   Complete by: As directed    Maintain surgical dressing until follow up in the clinic. If the edges start to pull up, may reinforce with tape. If the dressing is no longer working, may remove and cover with gauze and tape, but must keep the area dry and clean.  Call with any questions or concerns.   Constipation Prevention   Complete by: As directed    Drink plenty of fluids.  Prune juice may be helpful.  You may use a stool softener, such as Colace (over the counter) 100 mg twice a day.  Use MiraLax (over the counter) for constipation as needed.   Diet - low sodium heart healthy   Complete by: As directed    Discharge instructions   Complete by: As directed    Maintain surgical dressing until follow up in the clinic. If the edges start to pull up, may reinforce with tape. If the dressing is no longer working, may remove and cover with gauze and tape, but must keep the area dry and clean.  Follow up in 2 weeks at [facility]. Call with any questions or concerns.   Increase activity slowly as tolerated   Complete by: As directed    Partial weight bearing with assist device as directed.  Avoid active abduction of the left hip (raising the hip to your side, away from the body)   TED hose   Complete by: As directed    Use stockings (TED hose) for 2 weeks on both leg(s).  You may remove them at night for sleeping.     Allergies as of 02/29/2020      Reactions   Aspartame And Phenylalanine Palpitations   Artificial sweetener- hrt 140  went to ED      Medication List    STOP taking these medications   acetaminophen 500 MG tablet Commonly known as: TYLENOL   azithromycin 250 MG tablet Commonly known as: Zithromax Z-Pak   guaiFENesin-codeine 100-10 MG/5ML  syrup Commonly known as: ROBITUSSIN AC   HYDROcodone-acetaminophen 5-325 MG tablet Commonly known as: Norco Replaced by: HYDROcodone-acetaminophen 7.5-325 MG tablet     TAKE these medications   aspirin 81 MG chewable tablet Chew 1 tablet (81 mg total) by mouth 2 (two) times daily for 28 days.   celecoxib 200 MG capsule Commonly known as: CELEBREX Take 1 capsule (200 mg total) by mouth 2 (two) times daily.   docusate sodium 100  MG capsule Commonly known as: Colace Take 1 capsule (100 mg total) by mouth 2 (two) times daily.   ferrous sulfate 325 (65 FE) MG tablet Take 1 tablet (325 mg total) by mouth 3 (three) times daily after meals for 14 days.   fluticasone 50 MCG/ACT nasal spray Commonly known as: FLONASE Place 2 sprays into both nostrils daily.   HYDROcodone-acetaminophen 7.5-325 MG tablet Commonly known as: NORCO Take 1-2 tablets by mouth every 6 (six) hours as needed for severe pain (pain score 7-10). Replaces: HYDROcodone-acetaminophen 5-325 MG tablet   methocarbamol 500 MG tablet Commonly known as: ROBAXIN Take 1 tablet (500 mg total) by mouth every 6 (six) hours as needed for muscle spasms.   polyethylene glycol 17 g packet Commonly known as: MIRALAX / GLYCOLAX Take 17 g by mouth 2 (two) times daily.   prednisoLONE acetate 1 % ophthalmic suspension Commonly known as: PRED FORTE Place 1 drop into the right eye 3 (three) times daily.   traZODone 100 MG tablet Commonly known as: DESYREL TAKE ONE TABLET BY MOUTH EVERY NIGHT AT BEDTIME AS NEEDED FOR SLEEP What changed:   how much to take  how to take this  when to take this  additional instructions   vitamin C 1000 MG tablet Take 1,000 mg by mouth daily.   VITAMIN D3 PO Take 1 tablet by mouth daily.   ZINC PO Take 1 tablet by mouth daily.            Discharge Care Instructions  (From admission, onward)         Start     Ordered   02/29/20 0000  Change dressing       Comments: Maintain  surgical dressing until follow up in the clinic. If the edges start to pull up, may reinforce with tape. If the dressing is no longer working, may remove and cover with gauze and tape, but must keep the area dry and clean.  Call with any questions or concerns.   02/29/20 1208          Follow-up Information    Paralee Cancel, MD. Schedule an appointment as soon as possible for a visit in 2 weeks.   Specialty: Orthopedic Surgery Contact information: [address] [place] [address] [phone]               Signed: Griffith Citron, PA-C Orthopedic Surgery 03/06/2020, 7:06 AM

## 2020-03-21 ENCOUNTER — Other Ambulatory Visit: Payer: Self-pay | Admitting: Nurse Practitioner

## 2020-03-21 DIAGNOSIS — G4700 Insomnia, unspecified: Secondary | ICD-10-CM

## 2020-03-21 NOTE — Telephone Encounter (Signed)
Refill request for pending medication last OV 10/03/19 last video visit 01/11/20. Please advise

## 2020-03-28 DIAGNOSIS — S72112D Displaced fracture of greater trochanter of left femur, subsequent encounter for closed fracture with routine healing: Secondary | ICD-10-CM | POA: Diagnosis not present

## 2020-03-28 DIAGNOSIS — M25552 Pain in left hip: Secondary | ICD-10-CM | POA: Diagnosis not present

## 2020-03-28 DIAGNOSIS — S72112A Displaced fracture of greater trochanter of left femur, initial encounter for closed fracture: Secondary | ICD-10-CM | POA: Diagnosis not present

## 2020-04-26 NOTE — Progress Notes (Signed)
DUE TO COVID-19 ONLY ONE VISITOR IS ALLOWED TO COME WITH YOU AND STAY IN THE WAITING ROOM ONLY DURING PRE OP AND PROCEDURE DAY OF SURGERY. THE 1 VISITOR  MAY VISIT WITH YOU AFTER SURGERY IN YOUR PRIVATE ROOM DURING VISITING HOURS ONLY!  YOU NEED TO HAVE A COVID 19 TEST ON_______ @_______ , THIS TEST MUST BE DONE BEFORE SURGERY,  COVID TESTING SITE [place] [address], IT IS ON THE RIGHT GOING OUT [address] APPROXIMATELY  2 MINUTES PAST [employer] ON THE RIGHT. ONCE YOUR COVID TEST IS COMPLETED,  PLEASE BEGIN THE QUARANTINE INSTRUCTIONS AS OUTLINED IN YOUR HANDOUT.                Jacob Mitchell  04/26/2020   Your procedure is scheduled on: 05/01/20     Report to [facility] Main  Entrance   Report to admitting at     145pm    Call this number if you have problems the morning of surgery [phone]    REMEMBER: NO  SOLID FOOD CANDY OR GUM AFTER MIDNIGHT. CLEAR LIQUIDS UNTIL 1245pm        . NOTHING BY MOUTH EXCEPT CLEAR LIQUIDS UNTIL    . PLEASE FINISH ENSURE DRINK PER SURGEON ORDER  WHICH NEEDS TO BE COMPLETED AT 1245pm     .      CLEAR LIQUID DIET   Foods Allowed                                                                    Coffee and tea, regular and decaf                            Fruit ices (not with fruit pulp)                                      Iced Popsicles                                    Carbonated beverages, regular and diet                                    Cranberry, grape and apple juices Sports drinks like Gatorade Lightly seasoned clear broth or consume(fat free) Sugar, honey syrup ___________________________________________________________________      BRUSH YOUR TEETH MORNING OF SURGERY AND RINSE YOUR MOUTH OUT, NO CHEWING GUM CANDY OR MINTS.     Take these medicines the morning of surgery with A SIP OF WATER: flonase   DO NOT TAKE ANY DIABETIC MEDICATIONS DAY OF YOUR SURGERY                                You may not have any metal on your body including hair pins and              piercings  Do not wear jewelry, make-up,  lotions, powders or perfumes, deodorant             Do not wear nail polish on your fingernails.  Do not shave  48 hours prior to surgery.              Men may shave face and neck.   Do not bring valuables to the hospital. [place].  Contacts, dentures or bridgework may not be worn into surgery.  Leave suitcase in the car. After surgery it may be brought to your room.     Patients discharged the day of surgery will not be allowed to drive home. IF YOU ARE HAVING SURGERY AND GOING HOME THE SAME DAY, YOU MUST HAVE AN ADULT TO DRIVE YOU HOME AND BE WITH YOU FOR 24 HOURS. YOU MAY GO HOME BY TAXI OR UBER OR ORTHERWISE, BUT AN ADULT MUST ACCOMPANY YOU HOME AND STAY WITH YOU FOR 24 HOURS.  Name and phone number of your driver:  Special Instructions: N/A              Please read over the following fact sheets you were given: _____________________________________________________________________  [facility] - Preparing for Surgery Before surgery, you can play an important role.  Because skin is not sterile, your skin needs to be as free of germs as possible.  You can reduce the number of germs on your skin by washing with CHG (chlorahexidine gluconate) soap before surgery.  CHG is an antiseptic cleaner which kills germs and bonds with the skin to continue killing germs even after washing. Please DO NOT use if you have an allergy to CHG or antibacterial soaps.  If your skin becomes reddened/irritated stop using the CHG and inform your nurse when you arrive at Short Stay. Do not shave (including legs and underarms) for at least 48 hours prior to the first CHG shower.  You may shave your face/neck. Please follow these instructions carefully:  1.  Shower with CHG Soap the night before surgery and the  morning of Surgery.  2.  If you  choose to wash your hair, wash your hair first as usual with your  normal  shampoo.  3.  After you shampoo, rinse your hair and body thoroughly to remove the  shampoo.                           4.  Use CHG as you would any other liquid soap.  You can apply chg directly  to the skin and wash                       Gently with a scrungie or clean washcloth.  5.  Apply the CHG Soap to your body ONLY FROM THE NECK DOWN.   Do not use on face/ open                           Wound or open sores. Avoid contact with eyes, ears mouth and genitals (private parts).                       Wash face,  Genitals (private parts) with your normal soap.             6.  Wash thoroughly, paying special attention to  the area where your surgery  will be performed.  7.  Thoroughly rinse your body with warm water from the neck down.  8.  DO NOT shower/wash with your normal soap after using and rinsing off  the CHG Soap.                9.  Pat yourself dry with a clean towel.            10.  Wear clean pajamas.            11.  Place clean sheets on your bed the night of your first shower and do not  sleep with pets. Day of Surgery : Do not apply any lotions/deodorants the morning of surgery.  Please wear clean clothes to the hospital/surgery center.  FAILURE TO FOLLOW THESE INSTRUCTIONS MAY RESULT IN THE CANCELLATION OF YOUR SURGERY PATIENT SIGNATURE_________________________________  NURSE SIGNATURE__________________________________  ________________________________________________________________________             DUE TO COVID-19 ONLY ONE VISITOR IS ALLOWED TO COME WITH YOU AND STAY IN THE WAITING ROOM ONLY DURING PRE OP AND PROCEDURE DAY OF SURGERY. THE 1 VISITOR  MAY VISIT WITH YOU AFTER SURGERY IN YOUR PRIVATE ROOM DURING VISITING HOURS ONLY!  YOU NEED TO HAVE A COVID 19 TEST ON_______ @_______ , THIS TEST MUST BE DONE BEFORE SURGERY,  COVID TESTING SITE [address] [place] [address], IT IS ON THE  RIGHT GOING OUT [address] APPROXIMATELY  2 MINUTES PAST [employer] ON THE RIGHT. ONCE YOUR COVID TEST IS COMPLETED,  PLEASE BEGIN THE QUARANTINE INSTRUCTIONS AS OUTLINED IN YOUR HANDOUT.                Jacob Mitchell  04/26/2020   Your procedure is scheduled on:    Report to [facility] Main  Entrance   Report to admitting at AM     Call this number if you have problems the morning of surgery [phone]    REMEMBER: NO  SOLID FOOD CANDY OR GUM AFTER MIDNIGHT. CLEAR LIQUIDS UNTIL         . NOTHING BY MOUTH EXCEPT CLEAR LIQUIDS UNTIL    . PLEASE FINISH ENSURE DRINK PER SURGEON ORDER  WHICH NEEDS TO BE COMPLETED AT      .      CLEAR LIQUID DIET   Foods Allowed                                                                    Coffee and tea, regular and decaf                            Fruit ices (not with fruit pulp)                                      Iced Popsicles                                    Carbonated beverages, regular and diet  Cranberry, grape and apple juices Sports drinks like Gatorade Lightly seasoned clear broth or consume(fat free) Sugar, honey syrup ___________________________________________________________________      BRUSH YOUR TEETH MORNING OF SURGERY AND RINSE YOUR MOUTH OUT, NO CHEWING GUM CANDY OR MINTS.     Take these medicines the morning of surgery with A SIP OF WATER:   DO NOT TAKE ANY DIABETIC MEDICATIONS DAY OF YOUR SURGERY                               You may not have any metal on your body including hair pins and              piercings  Do not wear jewelry, make-up, lotions, powders or perfumes, deodorant             Do not wear nail polish on your fingernails.  Do not shave  48 hours prior to surgery.              Men may shave face and neck.   Do not bring valuables to the hospital. [place].  Contacts, dentures or  bridgework may not be worn into surgery.  Leave suitcase in the car. After surgery it may be brought to your room.     Patients discharged the day of surgery will not be allowed to drive home. IF YOU ARE HAVING SURGERY AND GOING HOME THE SAME DAY, YOU MUST HAVE AN ADULT TO DRIVE YOU HOME AND BE WITH YOU FOR 24 HOURS. YOU MAY GO HOME BY TAXI OR UBER OR ORTHERWISE, BUT AN ADULT MUST ACCOMPANY YOU HOME AND STAY WITH YOU FOR 24 HOURS.  Name and phone number of your driver:  Special Instructions: N/A              Please read over the following fact sheets you were given: _____________________________________________________________________  [facility] - Preparing for Surgery Before surgery, you can play an important role.  Because skin is not sterile, your skin needs to be as free of germs as possible.  You can reduce the number of germs on your skin by washing with CHG (chlorahexidine gluconate) soap before surgery.  CHG is an antiseptic cleaner which kills germs and bonds with the skin to continue killing germs even after washing. Please DO NOT use if you have an allergy to CHG or antibacterial soaps.  If your skin becomes reddened/irritated stop using the CHG and inform your nurse when you arrive at Short Stay. Do not shave (including legs and underarms) for at least 48 hours prior to the first CHG shower.  You may shave your face/neck. Please follow these instructions carefully:  1.  Shower with CHG Soap the night before surgery and the  morning of Surgery.  2.  If you choose to wash your hair, wash your hair first as usual with your  normal  shampoo.  3.  After you shampoo, rinse your hair and body thoroughly to remove the  shampoo.                           4.  Use CHG as you would any other liquid soap.  You can apply chg directly  to the skin and wash  Gently with a scrungie or clean washcloth.  5.  Apply the CHG Soap to your body ONLY FROM THE NECK DOWN.   Do not use on  face/ open                           Wound or open sores. Avoid contact with eyes, ears mouth and genitals (private parts).                       Wash face,  Genitals (private parts) with your normal soap.             6.  Wash thoroughly, paying special attention to the area where your surgery  will be performed.  7.  Thoroughly rinse your body with warm water from the neck down.  8.  DO NOT shower/wash with your normal soap after using and rinsing off  the CHG Soap.                9.  Pat yourself dry with a clean towel.            10.  Wear clean pajamas.            11.  Place clean sheets on your bed the night of your first shower and do not  sleep with pets. Day of Surgery : Do not apply any lotions/deodorants the morning of surgery.  Please wear clean clothes to the hospital/surgery center.  FAILURE TO FOLLOW THESE INSTRUCTIONS MAY RESULT IN THE CANCELLATION OF YOUR SURGERY PATIENT SIGNATURE_________________________________  NURSE SIGNATURE__________________________________  ________________________________________________________________________

## 2020-04-27 DIAGNOSIS — Z4789 Encounter for other orthopedic aftercare: Secondary | ICD-10-CM | POA: Diagnosis not present

## 2020-04-27 DIAGNOSIS — S72112D Displaced fracture of greater trochanter of left femur, subsequent encounter for closed fracture with routine healing: Secondary | ICD-10-CM | POA: Diagnosis not present

## 2020-04-30 ENCOUNTER — Other Ambulatory Visit: Payer: Self-pay

## 2020-04-30 ENCOUNTER — Encounter (HOSPITAL_COMMUNITY): Payer: Self-pay

## 2020-04-30 ENCOUNTER — Encounter (HOSPITAL_COMMUNITY)
Admission: RE | Admit: 2020-04-30 | Discharge: 2020-04-30 | Disposition: A | Discharge status: Home / Self Care | Payer: Medicare Other | Source: Ambulatory Visit | Attending: Orthopedic Surgery | Admitting: Orthopedic Surgery

## 2020-04-30 MED ORDER — CEFAZOLIN SODIUM 10 G IJ SOLR
3.0000 g | INTRAMUSCULAR | Status: AC
  Administered 2020-05-01: 3 g via INTRAVENOUS
  Filled 2020-04-30: qty 3

## 2020-04-30 NOTE — Progress Notes (Signed)
Anesthesia Review:  [id number] Nche , NP 01/11/20- last visit was video  Cardiologist : Chest x-ray : EKG : Echo : Stress test: Cardiac Cath :  Activity level: can do a flight of stairs without difficulty  Sleep Study/ CPAP :no  Fasting Blood Sugar :      / Checks Blood Sugar -- times a day:   Blood Thinner/ Instructions /Last Dose: ASA / Instructions/ Last Dose :  Pt stated he never had preop appt .  Preop appt was done as a phone call.

## 2020-05-01 ENCOUNTER — Ambulatory Visit (HOSPITAL_COMMUNITY): Payer: Medicare Other | Admitting: Anesthesiology

## 2020-05-01 ENCOUNTER — Other Ambulatory Visit: Payer: Self-pay

## 2020-05-01 ENCOUNTER — Encounter (HOSPITAL_COMMUNITY)
Admission: RE | Disposition: A | Discharge status: Home Health | Payer: Self-pay | Source: Home / Self Care | Attending: Orthopedic Surgery

## 2020-05-01 ENCOUNTER — Ambulatory Visit: Payer: Self-pay

## 2020-05-01 ENCOUNTER — Encounter (HOSPITAL_COMMUNITY): Payer: Self-pay | Admitting: Orthopedic Surgery

## 2020-05-01 ENCOUNTER — Inpatient Hospital Stay (HOSPITAL_COMMUNITY)
Admission: RE | Admit: 2020-05-01 | Discharge: 2020-05-04 | DRG: 498 | Disposition: A | Discharge status: Home Health | Payer: Medicare Other | Attending: Orthopedic Surgery | Admitting: Orthopedic Surgery

## 2020-05-01 DIAGNOSIS — L7634 Postprocedural seroma of skin and subcutaneous tissue following other procedure: Secondary | ICD-10-CM | POA: Diagnosis present

## 2020-05-01 DIAGNOSIS — D62 Acute posthemorrhagic anemia: Secondary | ICD-10-CM | POA: Diagnosis not present

## 2020-05-01 DIAGNOSIS — Z79899 Other long term (current) drug therapy: Secondary | ICD-10-CM | POA: Diagnosis not present

## 2020-05-01 DIAGNOSIS — W19XXXD Unspecified fall, subsequent encounter: Secondary | ICD-10-CM | POA: Diagnosis present

## 2020-05-01 DIAGNOSIS — T8452XA Infection and inflammatory reaction due to internal left hip prosthesis, initial encounter: Secondary | ICD-10-CM | POA: Diagnosis present

## 2020-05-01 DIAGNOSIS — Y831 Surgical operation with implant of artificial internal device as the cause of abnormal reaction of the patient, or of later complication, without mention of misadventure at the time of the procedure: Secondary | ICD-10-CM | POA: Diagnosis present

## 2020-05-01 DIAGNOSIS — T84031A Mechanical loosening of internal left hip prosthetic joint, initial encounter: Principal | ICD-10-CM | POA: Diagnosis present

## 2020-05-01 DIAGNOSIS — Z9889 Other specified postprocedural states: Secondary | ICD-10-CM | POA: Diagnosis not present

## 2020-05-01 DIAGNOSIS — Z981 Arthrodesis status: Secondary | ICD-10-CM | POA: Diagnosis not present

## 2020-05-01 DIAGNOSIS — T84195A Other mechanical complication of internal fixation device of left femur, initial encounter: Secondary | ICD-10-CM | POA: Diagnosis not present

## 2020-05-01 DIAGNOSIS — S72112K Displaced fracture of greater trochanter of left femur, subsequent encounter for closed fracture with nonunion: Secondary | ICD-10-CM | POA: Diagnosis not present

## 2020-05-01 DIAGNOSIS — T8454XA Infection and inflammatory reaction due to internal left knee prosthesis, initial encounter: Secondary | ICD-10-CM | POA: Diagnosis not present

## 2020-05-01 DIAGNOSIS — T84125A Displacement of internal fixation device of left femur, initial encounter: Secondary | ICD-10-CM | POA: Diagnosis not present

## 2020-05-01 DIAGNOSIS — Z96652 Presence of left artificial knee joint: Secondary | ICD-10-CM | POA: Diagnosis not present

## 2020-05-01 DIAGNOSIS — Z8614 Personal history of Methicillin resistant Staphylococcus aureus infection: Secondary | ICD-10-CM

## 2020-05-01 DIAGNOSIS — R710 Precipitous drop in hematocrit: Secondary | ICD-10-CM | POA: Diagnosis not present

## 2020-05-01 DIAGNOSIS — Z8616 Personal history of COVID-19: Secondary | ICD-10-CM

## 2020-05-01 DIAGNOSIS — G4700 Insomnia, unspecified: Secondary | ICD-10-CM | POA: Diagnosis not present

## 2020-05-01 DIAGNOSIS — M96842 Postprocedural seroma of a musculoskeletal structure following a musculoskeletal system procedure: Secondary | ICD-10-CM | POA: Diagnosis not present

## 2020-05-01 HISTORY — PX: INCISION AND DRAINAGE HIP: SHX1801

## 2020-05-01 LAB — SURGICAL PCR SCREEN
MRSA, PCR: NEGATIVE
Staphylococcus aureus: NEGATIVE

## 2020-05-01 LAB — POCT I-STAT 7, (LYTES, BLD GAS, ICA,H+H)
Acid-base deficit: 2 mmol/L (ref 0.0–2.0)
Bicarbonate: 24.8 mmol/L (ref 20.0–28.0)
Calcium, Ion: 1.2 mmol/L (ref 1.15–1.40)
HCT: 35 % — ABNORMAL LOW (ref 39.0–52.0)
Hemoglobin: 11.9 g/dL — ABNORMAL LOW (ref 13.0–17.0)
O2 Saturation: 93 %
Potassium: 4.3 mmol/L (ref 3.5–5.1)
Sodium: 138 mmol/L (ref 135–145)
TCO2: 26 mmol/L (ref 22–32)
pCO2 arterial: 47.5 mmHg (ref 32.0–48.0)
pH, Arterial: 7.326 — ABNORMAL LOW (ref 7.350–7.450)
pO2, Arterial: 72 mmHg — ABNORMAL LOW (ref 83.0–108.0)

## 2020-05-01 LAB — CBC
HCT: 39.9 % (ref 39.0–52.0)
Hemoglobin: 12.3 g/dL — ABNORMAL LOW (ref 13.0–17.0)
MCH: 25.7 pg — ABNORMAL LOW (ref 26.0–34.0)
MCHC: 30.8 g/dL (ref 30.0–36.0)
MCV: 83.5 fL (ref 80.0–100.0)
Platelets: 225 10*3/uL (ref 150–400)
RBC: 4.78 MIL/uL (ref 4.22–5.81)
RDW: 15.6 % — ABNORMAL HIGH (ref 11.5–15.5)
WBC: 7.1 10*3/uL (ref 4.0–10.5)
nRBC: 0 % (ref 0.0–0.2)

## 2020-05-01 LAB — PREPARE RBC (CROSSMATCH)

## 2020-05-01 SURGERY — IRRIGATION AND DEBRIDEMENT HIP
Anesthesia: General | Site: Hip | Laterality: Left

## 2020-05-01 MED ORDER — FENTANYL CITRATE (PF) 250 MCG/5ML IJ SOLN
INTRAMUSCULAR | Status: AC
  Filled 2020-05-01: qty 5

## 2020-05-01 MED ORDER — OXYCODONE HCL 5 MG/5ML PO SOLN
5.0000 mg | Freq: Once | ORAL | Status: DC | PRN
Start: 2020-05-01 — End: 2020-05-01

## 2020-05-01 MED ORDER — ALBUMIN HUMAN 5 % IV SOLN
INTRAVENOUS | Status: AC
  Filled 2020-05-01: qty 750

## 2020-05-01 MED ORDER — PROPOFOL 10 MG/ML IV BOLUS
INTRAVENOUS | Status: AC
  Filled 2020-05-01: qty 20

## 2020-05-01 MED ORDER — FERROUS SULFATE 325 (65 FE) MG PO TABS
325.0000 mg | ORAL_TABLET | Freq: Three times a day (TID) | ORAL | Status: DC
  Administered 2020-05-02 – 2020-05-04 (×8): 325 mg via ORAL
  Filled 2020-05-01 (×8): qty 1

## 2020-05-01 MED ORDER — MORPHINE SULFATE (PF) 2 MG/ML IV SOLN: 0.5000 mg | INTRAVENOUS | Status: DC | PRN

## 2020-05-01 MED ORDER — HYDROCODONE-ACETAMINOPHEN 7.5-325 MG PO TABS: 1.0000 | ORAL_TABLET | ORAL | Status: DC | PRN

## 2020-05-01 MED ORDER — LIP MEDEX EX OINT
TOPICAL_OINTMENT | CUTANEOUS | Status: AC
  Filled 2020-05-01: qty 7

## 2020-05-01 MED ORDER — FENTANYL CITRATE (PF) 100 MCG/2ML IJ SOLN
INTRAMUSCULAR | Status: DC | PRN
  Administered 2020-05-01: 75 ug via INTRAVENOUS
  Administered 2020-05-01: 200 ug via INTRAVENOUS
  Administered 2020-05-01: 50 ug via INTRAVENOUS
  Administered 2020-05-01: 25 ug via INTRAVENOUS
  Administered 2020-05-01: 50 ug via INTRAVENOUS

## 2020-05-01 MED ORDER — CHLORHEXIDINE GLUCONATE 0.12 % MT SOLN
15.0000 mL | Freq: Once | OROMUCOSAL | Status: AC
  Administered 2020-05-01: 15 mL via OROMUCOSAL

## 2020-05-01 MED ORDER — BISACODYL 10 MG RE SUPP: 10.0000 mg | Freq: Every day | RECTAL | Status: DC | PRN

## 2020-05-01 MED ORDER — ONDANSETRON HCL 4 MG/2ML IJ SOLN: 4.0000 mg | Freq: Four times a day (QID) | INTRAMUSCULAR | Status: DC | PRN

## 2020-05-01 MED ORDER — PROPOFOL 1000 MG/100ML IV EMUL
INTRAVENOUS | Status: AC
  Filled 2020-05-01: qty 400

## 2020-05-01 MED ORDER — HYDROCODONE-ACETAMINOPHEN 5-325 MG PO TABS: 1.0000 | ORAL_TABLET | ORAL | Status: DC | PRN

## 2020-05-01 MED ORDER — ROCURONIUM BROMIDE 10 MG/ML (PF) SYRINGE
PREFILLED_SYRINGE | INTRAVENOUS | Status: AC
  Filled 2020-05-01: qty 10

## 2020-05-01 MED ORDER — ACETAMINOPHEN 10 MG/ML IV SOLN
INTRAVENOUS | Status: AC
  Filled 2020-05-01: qty 100

## 2020-05-01 MED ORDER — OXYCODONE HCL 5 MG PO TABS: 5.0000 mg | ORAL_TABLET | Freq: Once | ORAL | Status: DC | PRN

## 2020-05-01 MED ORDER — TRANEXAMIC ACID-NACL 1000-0.7 MG/100ML-% IV SOLN: 1000.0000 mg | Freq: Once | INTRAVENOUS | Status: DC

## 2020-05-01 MED ORDER — SODIUM CHLORIDE 0.9 % IV SOLN: INTRAVENOUS | Status: DC

## 2020-05-01 MED ORDER — DEXAMETHASONE SODIUM PHOSPHATE 10 MG/ML IJ SOLN
INTRAMUSCULAR | Status: AC
  Filled 2020-05-01: qty 1

## 2020-05-01 MED ORDER — PROPOFOL 500 MG/50ML IV EMUL
INTRAVENOUS | Status: AC
  Filled 2020-05-01: qty 100

## 2020-05-01 MED ORDER — SODIUM CHLORIDE 0.9 % IR SOLN
Status: DC | PRN
  Administered 2020-05-01 (×3): 3000 mL

## 2020-05-01 MED ORDER — ACETAMINOPHEN 10 MG/ML IV SOLN
INTRAVENOUS | Status: DC | PRN
  Administered 2020-05-01: 1000 mg via INTRAVENOUS

## 2020-05-01 MED ORDER — IRRISEPT - 450ML BOTTLE WITH 0.05% CHG IN STERILE WATER, USP 99.95% OPTIME
TOPICAL | Status: DC | PRN
  Administered 2020-05-01: 450 mL

## 2020-05-01 MED ORDER — HYDROMORPHONE HCL 1 MG/ML IJ SOLN
0.2500 mg | INTRAMUSCULAR | Status: DC | PRN
  Administered 2020-05-01 (×4): 0.5 mg via INTRAVENOUS

## 2020-05-01 MED ORDER — TRANEXAMIC ACID-NACL 1000-0.7 MG/100ML-% IV SOLN
1000.0000 mg | INTRAVENOUS | Status: AC
  Administered 2020-05-01: 1000 mg via INTRAVENOUS
  Filled 2020-05-01: qty 100

## 2020-05-01 MED ORDER — LIDOCAINE 2% (20 MG/ML) 5 ML SYRINGE
INTRAMUSCULAR | Status: DC | PRN
  Administered 2020-05-01: 100 mg via INTRAVENOUS

## 2020-05-01 MED ORDER — METOCLOPRAMIDE HCL 5 MG/ML IJ SOLN: 5.0000 mg | Freq: Three times a day (TID) | INTRAMUSCULAR | Status: DC | PRN

## 2020-05-01 MED ORDER — DEXAMETHASONE SODIUM PHOSPHATE 10 MG/ML IJ SOLN
10.0000 mg | Freq: Once | INTRAMUSCULAR | Status: AC
  Administered 2020-05-01: 10 mg via INTRAVENOUS

## 2020-05-01 MED ORDER — ROCURONIUM BROMIDE 10 MG/ML (PF) SYRINGE
PREFILLED_SYRINGE | INTRAVENOUS | Status: DC | PRN
  Administered 2020-05-01: 80 mg via INTRAVENOUS
  Administered 2020-05-01: 40 mg via INTRAVENOUS
  Administered 2020-05-01: 20 mg via INTRAVENOUS

## 2020-05-01 MED ORDER — VANCOMYCIN HCL 1000 MG IV SOLR
INTRAVENOUS | Status: DC | PRN
  Administered 2020-05-01: 1000 mg via TOPICAL

## 2020-05-01 MED ORDER — ACETAMINOPHEN 325 MG PO TABS: 325.0000 mg | ORAL_TABLET | Freq: Four times a day (QID) | ORAL | Status: DC | PRN

## 2020-05-01 MED ORDER — CEFAZOLIN SODIUM-DEXTROSE 2-4 GM/100ML-% IV SOLN
2.0000 g | Freq: Three times a day (TID) | INTRAVENOUS | Status: DC
  Administered 2020-05-02 – 2020-05-03 (×6): 2 g via INTRAVENOUS
  Filled 2020-05-01 (×6): qty 100

## 2020-05-01 MED ORDER — FENTANYL CITRATE (PF) 100 MCG/2ML IJ SOLN
INTRAMUSCULAR | Status: AC
  Filled 2020-05-01: qty 2

## 2020-05-01 MED ORDER — GLYCOPYRROLATE PF 0.2 MG/ML IJ SOSY
PREFILLED_SYRINGE | INTRAMUSCULAR | Status: DC | PRN
  Administered 2020-05-01: .2 mg via INTRAVENOUS

## 2020-05-01 MED ORDER — DOCUSATE SODIUM 100 MG PO CAPS
100.0000 mg | ORAL_CAPSULE | Freq: Two times a day (BID) | ORAL | Status: DC
  Administered 2020-05-02 – 2020-05-04 (×5): 100 mg via ORAL
  Filled 2020-05-01 (×5): qty 1

## 2020-05-01 MED ORDER — LIDOCAINE 2% (20 MG/ML) 5 ML SYRINGE
INTRAMUSCULAR | Status: AC
  Filled 2020-05-01: qty 10

## 2020-05-01 MED ORDER — ONDANSETRON HCL 4 MG/2ML IJ SOLN
INTRAMUSCULAR | Status: AC
  Filled 2020-05-01: qty 2

## 2020-05-01 MED ORDER — ONDANSETRON HCL 4 MG PO TABS: 4.0000 mg | ORAL_TABLET | Freq: Four times a day (QID) | ORAL | Status: DC | PRN

## 2020-05-01 MED ORDER — FLUTICASONE PROPIONATE 50 MCG/ACT NA SUSP
2.0000 | Freq: Every day | NASAL | Status: DC
  Administered 2020-05-02: 2 via NASAL
  Filled 2020-05-01: qty 16

## 2020-05-01 MED ORDER — METHOCARBAMOL 1000 MG/10ML IJ SOLN
500.0000 mg | Freq: Four times a day (QID) | INTRAVENOUS | Status: DC | PRN
  Filled 2020-05-01: qty 5

## 2020-05-01 MED ORDER — PROPOFOL 10 MG/ML IV BOLUS
INTRAVENOUS | Status: DC | PRN
  Administered 2020-05-01: 200 mg via INTRAVENOUS

## 2020-05-01 MED ORDER — PHENYLEPHRINE HCL (PRESSORS) 10 MG/ML IV SOLN
INTRAVENOUS | Status: AC
  Filled 2020-05-01: qty 1

## 2020-05-01 MED ORDER — LIDOCAINE 2% (20 MG/ML) 5 ML SYRINGE
INTRAMUSCULAR | Status: AC
  Filled 2020-05-01: qty 5

## 2020-05-01 MED ORDER — TRANEXAMIC ACID-NACL 1000-0.7 MG/100ML-% IV SOLN
INTRAVENOUS | Status: AC
  Administered 2020-05-01: 1000 mg via INTRAVENOUS
  Filled 2020-05-01: qty 100

## 2020-05-01 MED ORDER — PHENOL 1.4 % MT LIQD: 1.0000 | OROMUCOSAL | Status: DC | PRN

## 2020-05-01 MED ORDER — ASPIRIN 81 MG PO CHEW
81.0000 mg | CHEWABLE_TABLET | Freq: Two times a day (BID) | ORAL | Status: DC
  Administered 2020-05-02 – 2020-05-04 (×5): 81 mg via ORAL
  Filled 2020-05-01 (×5): qty 1

## 2020-05-01 MED ORDER — ONDANSETRON HCL 4 MG/2ML IJ SOLN
4.0000 mg | Freq: Once | INTRAMUSCULAR | Status: AC | PRN
  Administered 2020-05-01: 4 mg via INTRAVENOUS

## 2020-05-01 MED ORDER — METOCLOPRAMIDE HCL 5 MG PO TABS: 5.0000 mg | ORAL_TABLET | Freq: Three times a day (TID) | ORAL | Status: DC | PRN

## 2020-05-01 MED ORDER — PHENYLEPHRINE HCL (PRESSORS) 10 MG/ML IV SOLN
INTRAVENOUS | Status: AC
  Filled 2020-05-01: qty 2

## 2020-05-01 MED ORDER — METHOCARBAMOL 500 MG PO TABS: 500.0000 mg | ORAL_TABLET | Freq: Four times a day (QID) | ORAL | Status: DC | PRN

## 2020-05-01 MED ORDER — VANCOMYCIN HCL 1000 MG IV SOLR
INTRAVENOUS | Status: AC
  Filled 2020-05-01: qty 1000

## 2020-05-01 MED ORDER — PHENYLEPHRINE HCL-NACL 10-0.9 MG/250ML-% IV SOLN
INTRAVENOUS | Status: DC | PRN
  Administered 2020-05-01: 40 ug/min via INTRAVENOUS

## 2020-05-01 MED ORDER — SUCCINYLCHOLINE CHLORIDE 200 MG/10ML IV SOSY
PREFILLED_SYRINGE | INTRAVENOUS | Status: AC
  Filled 2020-05-01: qty 10

## 2020-05-01 MED ORDER — SUCCINYLCHOLINE CHLORIDE 200 MG/10ML IV SOSY
PREFILLED_SYRINGE | INTRAVENOUS | Status: DC | PRN
  Administered 2020-05-01: 140 mg via INTRAVENOUS

## 2020-05-01 MED ORDER — ORAL CARE MOUTH RINSE: 15.0000 mL | Freq: Once | OROMUCOSAL | Status: AC

## 2020-05-01 MED ORDER — LACTATED RINGERS IV SOLN: INTRAVENOUS | Status: DC

## 2020-05-01 MED ORDER — 0.9 % SODIUM CHLORIDE (POUR BTL) OPTIME
TOPICAL | Status: DC | PRN
  Administered 2020-05-01: 1000 mL

## 2020-05-01 MED ORDER — SUGAMMADEX SODIUM 200 MG/2ML IV SOLN
INTRAVENOUS | Status: DC | PRN
  Administered 2020-05-01: 300 mg via INTRAVENOUS

## 2020-05-01 MED ORDER — GLYCOPYRROLATE PF 0.2 MG/ML IJ SOSY
PREFILLED_SYRINGE | INTRAMUSCULAR | Status: AC
  Filled 2020-05-01: qty 1

## 2020-05-01 MED ORDER — LACTATED RINGERS IV SOLN: INTRAVENOUS | Status: DC | PRN

## 2020-05-01 MED ORDER — POLYETHYLENE GLYCOL 3350 17 G PO PACK: 17.0000 g | PACK | Freq: Every day | ORAL | Status: DC | PRN

## 2020-05-01 MED ORDER — ALBUMIN HUMAN 5 % IV SOLN: INTRAVENOUS | Status: DC | PRN

## 2020-05-01 MED ORDER — ONDANSETRON HCL 4 MG/2ML IJ SOLN
INTRAMUSCULAR | Status: DC | PRN
  Administered 2020-05-01: 4 mg via INTRAVENOUS

## 2020-05-01 MED ORDER — MENTHOL 3 MG MT LOZG: 1.0000 | LOZENGE | OROMUCOSAL | Status: DC | PRN

## 2020-05-01 MED ORDER — DEXAMETHASONE SODIUM PHOSPHATE 10 MG/ML IJ SOLN
10.0000 mg | Freq: Once | INTRAMUSCULAR | Status: AC
  Administered 2020-05-02: 10 mg via INTRAVENOUS
  Filled 2020-05-01: qty 1

## 2020-05-01 MED ORDER — SURGIRINSE WOUND IRRIGATION SYSTEM - OPTIME: TOPICAL | Status: DC | PRN

## 2020-05-01 SURGICAL SUPPLY — 44 items
ADH SKN CLS APL DERMABOND .7 (GAUZE/BANDAGES/DRESSINGS) ×2
CERCLAGE CABLE WITH CRIMP (Orthopedic Implant) IMPLANT
CNTNR URN SCR LID CUP LEK RST (MISCELLANEOUS) IMPLANT
CONT SPEC 4OZ STRL OR WHT (MISCELLANEOUS) ×2
COVER SURGICAL LIGHT HANDLE (MISCELLANEOUS) ×2 IMPLANT
DERMABOND ADVANCED (GAUZE/BANDAGES/DRESSINGS) ×2
DERMABOND ADVANCED .7 DNX12 (GAUZE/BANDAGES/DRESSINGS) IMPLANT
DRAPE ORTHO SPLIT 77X108 STRL (DRAPES) ×4
DRAPE SURG 17X11 SM STRL (DRAPES) ×2 IMPLANT
DRAPE SURG ORHT 6 SPLT 77X108 (DRAPES) ×2 IMPLANT
DRAPE U-SHAPE 47X51 STRL (DRAPES) ×2 IMPLANT
DRESSING MEPILEX FLEX 4X4 (GAUZE/BANDAGES/DRESSINGS) IMPLANT
DRSG AQUACEL AG ADV 3.5X10 (GAUZE/BANDAGES/DRESSINGS) ×1 IMPLANT
DRSG AQUACEL AG ADV 3.5X14 (GAUZE/BANDAGES/DRESSINGS) ×1 IMPLANT
DRSG MEPILEX FLEX 4X4 (GAUZE/BANDAGES/DRESSINGS) ×2
DURAPREP 26ML APPLICATOR (WOUND CARE) ×2 IMPLANT
ELECT REM PT RETURN 15FT ADLT (MISCELLANEOUS) ×2 IMPLANT
EVACUATOR 1/8 PVC DRAIN (DRAIN) ×1 IMPLANT
GLOVE ORTHO TXT STRL SZ7.5 (GLOVE) ×4 IMPLANT
GLOVE SURG ENC TEXT LTX SZ7 (GLOVE) IMPLANT
GLOVE SURG LTX SZ8 (GLOVE) IMPLANT
GLOVE SURG ORTHO LTX SZ8 (GLOVE) ×2 IMPLANT
GLOVE SURG UNDER POLY LF SZ7.5 (GLOVE) ×2 IMPLANT
GOWN STRL REUS W/TWL LRG LVL3 (GOWN DISPOSABLE) ×2 IMPLANT
HANDPIECE INTERPULSE COAX TIP (DISPOSABLE) ×2
HOLDER FOLEY CATH W/STRAP (MISCELLANEOUS) ×1 IMPLANT
IRRIGATION SURGIPHOR STRL (IV SOLUTION) ×1 IMPLANT
IV NS IRRIG 3000ML ARTHROMATIC (IV SOLUTION) ×2 IMPLANT
KIT BASIN OR (CUSTOM PROCEDURE TRAY) ×2 IMPLANT
KIT TURNOVER KIT A (KITS) ×2 IMPLANT
MANIFOLD NEPTUNE II (INSTRUMENTS) ×2 IMPLANT
PACK TOTAL JOINT (CUSTOM PROCEDURE TRAY) ×2 IMPLANT
PENCIL SMOKE EVACUATOR (MISCELLANEOUS) IMPLANT
SET HNDPC FAN SPRY TIP SCT (DISPOSABLE) ×1 IMPLANT
SPONGE LAP 18X18 RF (DISPOSABLE) ×3 IMPLANT
SUT ETHILON 3 0 PS 1 (SUTURE) ×1 IMPLANT
SUT MNCRL AB 3-0 PS2 18 (SUTURE) ×2 IMPLANT
SUT MNCRL AB 4-0 PS2 18 (SUTURE) ×2 IMPLANT
SUT STRATAFIX SPIRAL PDS+ 90CM (SUTURE) ×4
SUT VIC AB 1 CT1 36 (SUTURE) ×8 IMPLANT
SUT VIC AB 2-0 CT1 27 (SUTURE) ×8
SUT VIC AB 2-0 CT1 TAPERPNT 27 (SUTURE) ×2 IMPLANT
SUTURE STRATFX SPIRL PDS+ 90CM (SUTURE) IMPLANT
TOWEL OR 17X26 10 PK STRL BLUE (TOWEL DISPOSABLE) ×4 IMPLANT

## 2020-05-01 NOTE — Anesthesia Procedure Notes (Signed)
Procedure Name: Intubation Performed by: Cleda Daub, CRNA Pre-anesthesia Checklist: Patient identified, Emergency Drugs available, Suction available and Patient being monitored Patient Re-evaluated:Patient Re-evaluated prior to induction Oxygen Delivery Method: Circle system utilized Preoxygenation: Pre-oxygenation with 100% oxygen Induction Type: IV induction Ventilation: Mask ventilation without difficulty Laryngoscope Size: Mac and 4 Grade View: Grade II Tube type: Oral Tube size: 7.5 mm Number of attempts: 1 Airway Equipment and Method: Stylet and Oral airway Placement Confirmation: ETT inserted through vocal cords under direct vision,  positive ETCO2 and breath sounds checked- equal and bilateral Secured at: 24 cm Tube secured with: Tape Dental Injury: Teeth and Oropharynx as per pre-operative assessment

## 2020-05-01 NOTE — Interval H&P Note (Signed)
History and Physical Interval Note:  05/01/2020 2:31 PM  Jacob Mitchell  has presented today for surgery, with the diagnosis of Status post left total hip arthroplasty revision with recent open reduction internal fixation greater troch, possible infected seroma.  The various methods of treatment have been discussed with the patient and family. After consideration of risks, benefits and other options for treatment, the patient has consented to  Procedure(s) with comments: IRRIGATION AND DEBRIDEMENT LEFT HIP SEROMA (Left) - 90 mins as a surgical intervention.  The patient's history has been reviewed, patient examined, no change in status, stable for surgery.  I have reviewed the patient's chart and labs.  Questions were answered to the patient's satisfaction.     Mauri Pole

## 2020-05-01 NOTE — Anesthesia Postprocedure Evaluation (Signed)
Anesthesia Post Note  Patient: Jacob Mitchell  Procedure(s) Performed: IRRIGATION AND DEBRIDEMENT LEFT HIP SEROMA (Left Hip)     Patient location during evaluation: PACU Anesthesia Type: General Level of consciousness: awake and alert and oriented Pain management: pain level controlled Vital Signs Assessment: post-procedure vital signs reviewed and stable Respiratory status: spontaneous breathing, nonlabored ventilation and respiratory function stable Cardiovascular status: blood pressure returned to baseline and stable Postop Assessment: no apparent nausea or vomiting Anesthetic complications: no   No complications documented.  Last Vitals:  Vitals:   05/01/20 1930 05/01/20 1945  BP: 136/65 (!) 159/79  Pulse: 74 60  Resp: 14 (!) 8  Temp: (!) 35.8 C   SpO2: 100% 98%    Last Pain:  Vitals:   05/01/20 1917  TempSrc:   PainSc: 10-Worst pain ever                 Winfred Iiams A.

## 2020-05-01 NOTE — H&P (Addendum)
TOTAL HIP IRRIGATION AND DEBRIDEMENT ADMISSION H&P  Patient is admitted for left total hip arthroplasty I&D  Subjective:  Chief Complaint: left hip pain  HPI: Jacob Mitchell, 75 y.o. male, with a history of multiple left hip surgeries, who is well known to [facility]. He had a left total hip arthroplasty revision on 04/30/19 in [place] due to hip dislocation while on vacation. He subsequently fell in November 2021, which resulted in a displaced left greater trochanteric femur fracture. He underwent ORIF on 01/18/20, but was found to have a new greater trochanteric periprosthetic fracture during routine post-op follow up. He underwent a second ORIF on 02/27/20. In the early post-operative period, he developed a hematoma/seroma, which was drained in office. He had mild peri-incisional erythema, which was treated with Keflex and then Cipro and Bactrim. He continued to have swelling and erythema, and Dr. Alvan Dame discussed concerns about infected seroma. He reviewed risks, benefits, and expectations of I&D, and the patient wished to proceed. We will plan to take cultures intra-operatively. We may pursue PICC line placement pending intra-op findings.   Patient Active Problem List   Diagnosis Date Noted  . Greater trochanter fracture ([place]) 01/17/2020  . Cough 05/14/2018  . Insomnia 10/21/2017  . Obese 10/15/2016  . 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 History of nephrolithiasis 07/24/2016  . Neuropathy 07/24/2016  . S/P left THA, AA 11/23/2015   Past Medical History:  Diagnosis Date  . Benign localized prostatic hyperplasia with lower urinary tract symptoms (LUTS)   . Complication of anesthesia    pt says he gets "restless" when he is waking up and nurses have had to "hold him down"- happened several surgeries ago   . Diverticulosis of colon   . History of colon polyps   . History of kidney stones   . History of MRSA infection 2007   post right shoulder surgery  . History of urinary retention  05/31/2017   due to BPH  . Incomplete emptying of bladder   . OA (osteoarthritis)   . Peripheral neuropathy    feet-- hx frost bite  . Pinched nerve in neck    right index and middle fingers numbness  . Wears glasses   . Wears hearing aid in both ears     Past Surgical History:  Procedure Laterality Date  . CATARACT EXTRACTION Left 01/13/2020  . COLONOSCOPY    . CYST REMOVAL LEG Right 1975  . CYSTOSCOPY WITH INSERTION OF UROLIFT N/A 07/20/2017   Procedure: CYSTOSCOPY WITH INSERTION OF UROLIFT;  Surgeon: Cleon Gustin, MD;  Location: [facility];  Service: Urology;  Laterality: N/A;  . CYSTOSCOPY WITH INSERTION OF UROLIFT    . DISTAL CLAVICLE EXCISION Left 1977   left shoulder  . ELBOW SURGERY Right 1969;  1980  . HAND SURGERY Right 1968  . LAMINECTOMY  1970   L4-5  . LUMBAR FUSION  2013  approx.   L1--L5  . ORIF HIP FRACTURE Left 01/17/2020   Procedure: OPEN REDUCTION INTERNAL FIXATION LEFT HIP GREATER TROCHANTER FRACTURE;  Surgeon: Paralee Cancel, MD;  Location: [facility];  Service: Orthopedics;  Laterality: Left;  90 MINS  . ORIF PERIPROSTHETIC FRACTURE Left 02/27/2020   Procedure: OPEN REDUCTION INTERNAL FIXATION (ORIF) LEFT PERI-PROSTETIC  HIP FRACTURE;  Surgeon: Paralee Cancel, MD;  Location: [facility];  Service: Orthopedics;  Laterality: Left;  27mins  . ROTATOR CUFF REPAIR Right x5    last one 2007   5 surgeries in 9 days---  original repair then 4 sx'  I&D sx's for MRSA infection  . SHOULDER ARTHROSCOPY WITH DISTAL CLAVICLE RESECTION Left 1977  . SHOULDER SURGERY Left 1976  . TOTAL HIP ARTHROPLASTY Left 11/23/2015   Procedure: LEFT TOTAL HIP ARTHROPLASTY ANTERIOR APPROACH;  Surgeon: Paralee Cancel, MD;  Location: [facility];  Service: Orthopedics;  Laterality: Left;  . TOTAL HIP REVISION     10/14/16 Dr. Alvan Dame  . TOTAL HIP REVISION Left 10/14/2016   Procedure: TOTAL HIP REVISION POSTERIOR APPROACH;  Surgeon: Paralee Cancel, MD;  Location: [facility];  Service: Orthopedics;   Laterality: Left; (total of 4 hip surgeries in all)    Current Facility-Administered Medications  Medication Dose Route Frequency Provider Last Rate Last Admin  . ceFAZolin (ANCEF) 3 g in dextrose 5 % 50 mL IVPB  3 g Intravenous On Call to OR Paralee Cancel, MD       Current Outpatient Medications  Medication Sig Dispense Refill Last Dose  . amoxicillin-clavulanate (AUGMENTIN) 875-125 MG tablet Take 1 tablet by mouth in the morning and at bedtime. Last dose on 04/29/20 pm     . ciprofloxacin (CIPRO) 500 MG tablet Take 500 mg by mouth in the morning and at bedtime. Completed on 04/29/20     . traZODone (DESYREL) 100 MG tablet Take 1 tablet (100 mg total) by mouth at bedtime as needed. 30 tablet 1   . Ascorbic Acid (VITAMIN C) 1000 MG tablet Take 1,000 mg by mouth daily. (Patient not taking: Reported on 04/17/2020)   Not Taking at Unknown time  . Cholecalciferol (VITAMIN D3 PO) Take 1 tablet by mouth daily. (Patient not taking: Reported on 04/17/2020)   Not Taking at Unknown time  . docusate sodium (COLACE) 100 MG capsule Take 1 capsule (100 mg total) by mouth 2 (two) times daily. (Patient not taking: Reported on 04/17/2020)   Not Taking at Unknown time  . ferrous sulfate 325 (65 FE) MG tablet Take 1 tablet (325 mg total) by mouth 3 (three) times daily after meals for 14 days. (Patient not taking: Reported on 04/17/2020) 42 tablet 0 Not Taking at Unknown time  . fluticasone (FLONASE) 50 MCG/ACT nasal spray Place 2 sprays into both nostrils daily. 16 g 0   . HYDROcodone-acetaminophen (NORCO) 7.5-325 MG tablet Take 1-2 tablets by mouth every 6 (six) hours as needed for severe pain (pain score 7-10). (Patient not taking: Reported on 04/17/2020) 42 tablet 0 Not Taking at Unknown time  . methocarbamol (ROBAXIN) 500 MG tablet Take 1 tablet (500 mg total) by mouth every 6 (six) hours as needed for muscle spasms. (Patient not taking: Reported on 04/17/2020) 40 tablet 0 Not Taking at Unknown time  . Multiple  Vitamins-Minerals (ZINC PO) Take 1 tablet by mouth daily. (Patient not taking: Reported on 04/17/2020)   Not Taking at Unknown time  . polyethylene glycol (MIRALAX / GLYCOLAX) 17 g packet Take 17 g by mouth 2 (two) times daily. (Patient not taking: Reported on 04/17/2020) 14 each 0 Not Taking at Unknown time  . prednisoLONE acetate (PRED FORTE) 1 % ophthalmic suspension Place 1 drop into the right eye 3 (three) times daily. (Patient not taking: Reported on 04/17/2020)   Not Taking at Unknown time   Allergies  Allergen Reactions  . Aspartame And Phenylalanine Palpitations    Artificial sweetener- hrt 140  went to ED    Social History   Tobacco Use  . Smoking status: Never Smoker  . Smokeless tobacco: Never Used  Substance Use Topics  .  Alcohol use: No    Family History  Problem Relation Age of Onset  . Alcohol abuse Father   . Drug abuse Brother       Review of Systems  Constitutional: Negative for chills and fever.  Respiratory: Negative for cough and shortness of breath.   Cardiovascular: Negative for chest pain.  Gastrointestinal: Negative for nausea and vomiting.  Musculoskeletal: Positive for arthralgias.    Objective:  Physical Exam Very pleasant 75 year old male awake alert and oriented. He continues to use a walker for mobilization assistance related to his injury and subsequent surgeries. He is in no acute distress.  Left hip and thigh exam His surgical incision is healed there is no drainage He does have evidence of erythema laterally with mild warmth No obvious fluctuance but perhaps still some subcutaneous extrafascial swelling Vital signs in last 24 hours:     Labs:   Estimated body mass index is 35.22 kg/m as calculated from the following:   Height as of 02/27/20: 6\' 7"  (2.007 m).   Weight as of 02/27/20: 141.8 kg.  Imaging Review:  Plain radiographs demonstrate stable fixation and position of the greater trochanteric fractured segment with intact hardware  without evidence of hardware breakage.    Assessment/Plan:  Infected seroma, left hip(s) with failed previous arthroplasty.  The patient history, physical examination, clinical judgement of the provider and imaging studies are consistent with infected seroma of the left hip(s), previous total hip arthroplasty. Revision total hip arthroplasty is deemed medically necessary. The treatment options including medical management, injection therapy, arthroscopy and arthroplasty were discussed at length. The risks and benefits of irrigation and debridement were presented and reviewed. The risks due to aseptic loosening, infection, stiffness, dislocation/subluxation,  thromboembolic complications and other imponderables were discussed.  The patient acknowledged the explanation, agreed to proceed with the plan and consent was signed. Patient is being admitted for inpatient treatment for surgery, pain control, PT, OT, prophylactic antibiotics, VTE prophylaxis, progressive ambulation and ADL's and discharge planning. The patient is planning to be discharged home.    Griffith Citron, PA-C Orthopedic Surgery [facility] [place] [phone]

## 2020-05-01 NOTE — Transfer of Care (Signed)
Immediate Anesthesia Transfer of Care Note  Patient: JAQUIS PICKLESIMER  Procedure(s) Performed: IRRIGATION AND DEBRIDEMENT LEFT HIP SEROMA (Left Hip)  Patient Location: PACU  Anesthesia Type:General  Level of Consciousness: awake and alert   Airway & Oxygen Therapy: Patient Spontanous Breathing and Patient connected to face mask oxygen  Post-op Assessment: Report given to RN and Post -op Vital signs reviewed and stable  Post vital signs: Reviewed and stable  Last Vitals:  Vitals Value Taken Time  BP 165/95 05/01/20 1906  Temp    Pulse 64 05/01/20 1909  Resp 0 05/01/20 1909  SpO2 100 % 05/01/20 1909  Vitals shown include unvalidated device data.  Last Pain:  Vitals:   05/01/20 1403  TempSrc: Oral  PainSc:          Complications: No complications documented.

## 2020-05-01 NOTE — Brief Op Note (Signed)
05/01/2020  6:53 PM  PATIENT:  Jacob Mitchell  75 y.o. male  PRE-OPERATIVE DIAGNOSIS:  Status post left total hip arthroplasty revision with recent open reduction internal fixation greater troch, possible infected seroma  POST-OPERATIVE DIAGNOSIS:  1.Status post left total hip arthroplasty revision with recent open reduction internal fixation greater troch, possible infected seroma 2.  Failed fixation of greater trochanteric fracture with loose cable plate   PROCEDURE:  Procedure(s) with comments: IRRIGATION AND DEBRIDEMENT LEFT HIP SEROMA (Left) - 90 mins  2.  Removal of left hip hardware and 5 cables  SURGEON:  Surgeon(s) and Role:    Paralee Cancel, MD - Primary  PHYSICIAN ASSISTANT: Griffith Citron, PA-C  ANESTHESIA:   general  EBL:  2000 mL   BLOOD ADMINISTERED:600 CC PRBC  DRAINS: (medium) Hemovact drain(s) in the deep left hip with  Suction Open   LOCAL MEDICATIONS USED:  Subcutaneously placed Vancomycin  SPECIMEN:  Source of Specimen:  left hip seroma  DISPOSITION OF SPECIMEN:  PATHOLOGY  COUNTS:  YES  TOURNIQUET:  * No tourniquets in log *  DICTATION: .Other Dictation: Dictation Number [id number]  PLAN OF CARE: Admit to inpatient   PATIENT DISPOSITION:  PACU - hemodynamically stable.   Delay start of Pharmacological VTE agent (>24hrs) due to surgical blood loss or risk of bleeding: no

## 2020-05-01 NOTE — Anesthesia Preprocedure Evaluation (Signed)
Anesthesia Evaluation  Patient identified by MRN, date of birth, ID band Patient awake    Reviewed: Allergy & Precautions, NPO status , Patient's Chart, lab work & pertinent test results, reviewed documented beta blocker date and time   History of Anesthesia Complications (+) history of anesthetic complications  Airway Mallampati: III  TM Distance: >3 FB Neck ROM: Full    Dental no notable dental hx. (+) Teeth Intact   Pulmonary Recent URI  (Recent COVID-19 infection 02/13/20- asymptomatic), Resolved,    Pulmonary exam normal breath sounds clear to auscultation       Cardiovascular negative cardio ROS Normal cardiovascular exam Rhythm:Regular Rate:Normal     Neuro/Psych Peripheral neuropathy  Neuromuscular disease negative psych ROS   GI/Hepatic negative GI ROS, Neg liver ROS,   Endo/Other  negative endocrine ROS  Renal/GU Hx/o renal calculi Bladder dysfunction (BPH w/ urinary retention)  Incomplete bladder emptying    Musculoskeletal  (+) Arthritis , Osteoarthritis,  Possible infected seroma left hip S/P Left THR   Abdominal   Peds  Hematology  (+) anemia ,   Anesthesia Other Findings   Reproductive/Obstetrics                             Anesthesia Physical  Anesthesia Plan  ASA: II  Anesthesia Plan: General   Post-op Pain Management:    Induction: Intravenous  PONV Risk Score and Plan: 3 and Ondansetron, Dexamethasone, Treatment may vary due to age or medical condition and Midazolam  Airway Management Planned: Oral ETT  Additional Equipment: None  Intra-op Plan:   Post-operative Plan: Extubation in OR  Informed Consent: I have reviewed the patients History and Physical, chart, labs and discussed the procedure including the risks, benefits and alternatives for the proposed anesthesia with the patient or authorized representative who has indicated his/her understanding and  acceptance.     Dental advisory given  Plan Discussed with: CRNA and Anesthesiologist  Anesthesia Plan Comments:         Anesthesia Quick Evaluation

## 2020-05-01 NOTE — Anesthesia Procedure Notes (Signed)
Date/Time: 05/01/2020 6:56 PM Performed by: Cynda Familia, CRNA Oxygen Delivery Method: Simple face mask Placement Confirmation: breath sounds checked- equal and bilateral and positive ETCO2 Dental Injury: Teeth and Oropharynx as per pre-operative assessment

## 2020-05-02 ENCOUNTER — Inpatient Hospital Stay: Payer: Self-pay

## 2020-05-02 ENCOUNTER — Encounter (HOSPITAL_COMMUNITY): Payer: Self-pay | Admitting: Orthopedic Surgery

## 2020-05-02 LAB — CBC
HCT: 30.5 % — ABNORMAL LOW (ref 39.0–52.0)
Hemoglobin: 9.2 g/dL — ABNORMAL LOW (ref 13.0–17.0)
MCH: 25.6 pg — ABNORMAL LOW (ref 26.0–34.0)
MCHC: 30.2 g/dL (ref 30.0–36.0)
MCV: 85 fL (ref 80.0–100.0)
Platelets: 225 10*3/uL (ref 150–400)
RBC: 3.59 MIL/uL — ABNORMAL LOW (ref 4.22–5.81)
RDW: 15.4 % (ref 11.5–15.5)
WBC: 12.9 10*3/uL — ABNORMAL HIGH (ref 4.0–10.5)
nRBC: 0 % (ref 0.0–0.2)

## 2020-05-02 LAB — BASIC METABOLIC PANEL
Anion gap: 9 (ref 5–15)
BUN: 23 mg/dL (ref 8–23)
CO2: 24 mmol/L (ref 22–32)
Calcium: 8.4 mg/dL — ABNORMAL LOW (ref 8.9–10.3)
Chloride: 105 mmol/L (ref 98–111)
Creatinine, Ser: 1.18 mg/dL (ref 0.61–1.24)
GFR, Estimated: >60 mL/min (ref >60)
Glucose, Bld: 134 mg/dL — ABNORMAL HIGH (ref 70–99)
Potassium: 4.8 mmol/L (ref 3.5–5.1)
Sodium: 138 mmol/L (ref 135–145)

## 2020-05-02 MED ORDER — CHLORHEXIDINE GLUCONATE CLOTH 2 % EX PADS
6.0000 | MEDICATED_PAD | Freq: Every day | CUTANEOUS | Status: DC
  Administered 2020-05-02 – 2020-05-04 (×3): 6 via TOPICAL

## 2020-05-02 MED ORDER — SODIUM CHLORIDE 0.9% FLUSH
10.0000 mL | INTRAVENOUS | Status: DC | PRN
  Administered 2020-05-04: 10 mL

## 2020-05-02 NOTE — Evaluation (Addendum)
Physical Therapy Evaluation Patient Details Name: Jacob Mitchell MRN: [id number] DOB: Mar 30, 1945 Today's Date: 05/02/2020   History of Present Illness  Pt is a 75 year old male s/p I &D L hip seroma. PMH: multiple surgeries, most recently  s/p Open reduction internal fixation of left periprosthetic fx fracture,  L THA and posterior left hip revision  Clinical Impression  Pt admitted with above diagnosis.  Pt is familiar to our service from previous surgeries. He is mobilizing well today with pain controlled. Will continue to see pt in acute setting for continued work on mobility and education.  Pt currently with functional limitations due to the deficits listed below (see PT Problem List). Pt will benefit from skilled PT to increase their independence and safety with mobility to allow discharge to the venue listed below.       Follow Up Recommendations Follow surgeon's recommendation for DC plan and follow-up therapies    Equipment Recommendations  None recommended by PT    Recommendations for Other Services       Precautions / Restrictions Precautions Precautions: Fall;Posterior Hip Precaution Comments: reviewed posterior hip precautions Restrictions Weight Bearing Restrictions: No LLE Weight Bearing: Weight bearing as tolerated      Mobility  Bed Mobility Overal bed mobility: Needs Assistance Bed Mobility: Supine to Sit     Supine to sit: min assist;HOB elevated      General bed mobility comments: for safety, cues for THP    Transfers Overall transfer level: Needs assistance Equipment used: Rolling walker (2 wheeled) Transfers: Sit to/from Stand Sit to Stand: Min guard         General transfer comment: cues for hand placement, THP--LLE position  Ambulation/Gait Ambulation/Gait assistance: Min guard;Min assist Gait Distance (Feet): 90 Feet Assistive device: Rolling walker (2 wheeled) Gait Pattern/deviations: Step-to pattern;Decreased stance time -  left;Decreased weight shift to left     General Gait Details: cues for sequence and THP with turns, min-min/guard for balance, safe maneuvering RW  Stairs            Wheelchair Mobility    Modified Rankin (Stroke Patients Only)       Balance                                             Pertinent Vitals/Pain Pain Assessment: 0-10 Pain Score: 0-No pain Pain Intervention(s): Limited activity within patient's tolerance;Monitored during session;Repositioned    Home Living Family/patient expects to be discharged to:: Private residence Living Arrangements: Spouse/significant other Available Help at Discharge: Family Type of Home: House Home Access: Level entry     Home Layout: One level Home Equipment: Environmental consultant - 2 wheels;Cane - single point      Prior Function Level of Independence: Independent               Hand Dominance        Extremity/Trunk Assessment   Upper Extremity Assessment Upper Extremity Assessment: Overall WFL for tasks assessed    Lower Extremity Assessment Lower Extremity Assessment: LLE deficits/detail LLE Deficits / Details: ankle WFL, knee and hip  grossly 2+ to 3/5       Communication   Communication: No difficulties  Cognition Arousal/Alertness: Awake/alert Behavior During Therapy: WFL for tasks assessed/performed Overall Cognitive Status: Within Functional Limits for tasks assessed  General Comments      Exercises     Assessment/Plan    PT Assessment Patient needs continued PT services  PT Problem List Decreased strength;Decreased balance;Decreased knowledge of use of DME;Pain;Decreased mobility;Decreased knowledge of precautions       PT Treatment Interventions DME instruction;Therapeutic activities;Gait training;Therapeutic exercise;Patient/family education;Functional mobility training    PT Goals (Current goals can be found in the Care Plan  section)  Acute Rehab PT Goals Patient Stated Goal: be able to move around PT Goal Formulation: With patient Time For Goal Achievement: 05/09/20 Potential to Achieve Goals: Good    Frequency 7X/week   Barriers to discharge        Co-evaluation               AM-PAC PT "6 Clicks" Mobility  Outcome Measure Help needed turning from your back to your side while in a flat bed without using bedrails?: A Little Help needed moving from lying on your back to sitting on the side of a flat bed without using bedrails?: A Little Help needed moving to and from a bed to a chair (including a wheelchair)?: A Little Help needed standing up from a chair using your arms (e.g., wheelchair or bedside chair)?: A Little Help needed to walk in hospital room?: A Little Help needed climbing 3-5 steps with a railing? : A Lot 6 Click Score: 17    End of Session Equipment Utilized During Treatment: Gait belt Activity Tolerance: Patient tolerated treatment well Patient left: in chair;with call bell/phone within reach;with family/visitor present   PT Visit Diagnosis: Other abnormalities of gait and mobility (R26.89);Difficulty in walking, not elsewhere classified (R26.2)    Time: 4158-3094 PT Time Calculation (min) (ACUTE ONLY): 23 min   Charges:   PT Evaluation $PT Eval Low Complexity: 1 Low PT Treatments $Gait Training: 8-22 mins        Baxter Flattery, PT  Acute Rehab Dept ([place]) [phone] Pager [phone]  05/02/2020   [facility] 05/02/2020, 12:03 PM

## 2020-05-02 NOTE — Progress Notes (Signed)
Peripherally Inserted Central Catheter Placement  The IV Nurse has discussed with the patient and/or persons authorized to consent for the patient, the purpose of this procedure and the potential benefits and risks involved with this procedure.  The benefits include less needle sticks, lab draws from the catheter, and the patient may be discharged home with the catheter. Risks include, but not limited to, infection, bleeding, blood clot (thrombus formation), and puncture of an artery; nerve damage and irregular heartbeat and possibility to perform a PICC exchange if needed/ordered by physician.  Alternatives to this procedure were also discussed.  Bard Power PICC patient education guide, fact sheet on infection prevention and patient information card has been provided to patient /or left at bedside.    PICC Placement Documentation  PICC Single Lumen 05/02/20 PICC Left Brachial 48 cm 0 cm (Active)  Indication for Insertion or Continuance of Line Prolonged intravenous therapies 05/02/20 1755  Exposed Catheter (cm) 0 cm 05/02/20 1755  Site Assessment Clean;Dry;Intact 05/02/20 1755  Line Status Flushed;Blood return noted 05/02/20 1755  Dressing Type Transparent 05/02/20 1755  Dressing Status Clean;Dry;Intact 05/02/20 1755  Antimicrobial disc in place? Yes 05/02/20 1755  Line Adjustment (NICU/IV Team Only) Yes 05/02/20 1755  Dressing Intervention New dressing;Other (Comment) 05/02/20 1755       Jacob Mitchell 05/02/2020, 5:56 PM

## 2020-05-02 NOTE — Progress Notes (Signed)
   Subjective: 1 Day Post-Op Procedure(s) (LRB): IRRIGATION AND DEBRIDEMENT LEFT HIP SEROMA (Left) Patient reports pain as mild.   Patient seen in rounds by Dr. Alvan Dame. Patient is resting in bed this morning on exam. No acute events overnight. Foley catheter removed. Awaiting PICC line placement.  We will start therapy today.   Objective: Vital signs in last 24 hours: Temp:  [96.5 F (35.8 C)-98.3 F (36.8 C)] 98 F (36.7 C) (03/23 0353) Pulse Rate:  [48-81] 71 (03/23 0353) Resp:  [8-18] 18 (03/23 0353) BP: (105-165)/(60-95) 146/76 (03/23 0353) SpO2:  [91 %-100 %] 99 % (03/23 0800) Weight:  [136.1 kg] 136.1 kg (03/22 2214)  Intake/Output from previous day:  Intake/Output Summary (Last 24 hours) at 05/02/2020 0830 Last data filed at 05/02/2020 0615 Gross per 24 hour  Intake 5255 ml  Output 4365 ml  Net 890 ml     Intake/Output this shift: No intake/output data recorded.  Labs: Recent Labs    05/01/20 1351 05/01/20 1729  HGB 12.3* 11.9*   Recent Labs    05/01/20 1351 05/01/20 1729  WBC 7.1  --   RBC 4.78  --   HCT 39.9 35.0*  PLT 225  --    Recent Labs    05/01/20 1729  NA 138  K 4.3   No results for input(s): LABPT, INR in the last 72 hours.  Exam: General - Patient is Alert and Oriented Extremity - Neurologically intact Sensation intact distally Intact pulses distally Dorsiflexion/Plantar flexion intact Dressing - dressing C/D/I Motor Function - intact, moving foot and toes well on exam.   Past Medical History:  Diagnosis Date  . Benign localized prostatic hyperplasia with lower urinary tract symptoms (LUTS)   . Complication of anesthesia    pt says he gets "restless" when he is waking up and nurses have had to "hold him down"- happened several surgeries ago   . Diverticulosis of colon   . History of colon polyps   . History of kidney stones   . History of MRSA infection 2007   post right shoulder surgery  . History of urinary retention  05/31/2017   due to BPH  . Incomplete emptying of bladder   . OA (osteoarthritis)   . Peripheral neuropathy    feet-- hx frost bite  . Pinched nerve in neck    right index and middle fingers numbness  . Wears glasses   . Wears hearing aid in both ears     Assessment/Plan: 1 Day Post-Op Procedure(s) (LRB): IRRIGATION AND DEBRIDEMENT LEFT HIP SEROMA (Left) Active Problems:   S/P revision of total knee, left  Estimated body mass index is 33.8 kg/m as calculated from the following:   Height as of this encounter: 6\' 7"  (2.007 m).   Weight as of this encounter: 136.1 kg. Advance diet Up with therapy  DVT Prophylaxis - Aspirin Weight bearing as tolerated Hemovac drain left in place Begin therapy Hip precautions discussed with patient  Plan is to go Home after hospital stay. He will likely stay through the week in order to have PICC line placed, await culture results & determine home ABX regimen, and work on safe mobility/ambulation.   Plan to have PICC placed today. Appreciate ID recommendations.   Plan to get patient up with PT. He may be WBAT with a walker for safe ambulation and protection of the hip.   Griffith Citron, PA-C Orthopedic Surgery [phone] 05/02/2020, 8:30 AM

## 2020-05-02 NOTE — Progress Notes (Addendum)
   05/02/20 1500  PT Visit Information  Last PT Received On 05/02/20  Assistance Needed +1  Pt back in bed, politely declined amb. Pain well controlled. Initiated posterior hip HEP and reviewed THP precautions with pt and wife.    History of Present Illness Pt is a 75 year old male s/p I &D L hip seroma. PMH: multiple L hip surgeries, most recently  s/p Open reduction internal fixation of left periprosthetic fx fracture,  L DA THA and posterior left hip revision  Subjective Data  Patient Stated Goal be able to move around  Precautions  Precautions Fall;Posterior Hip  Precaution Comments reviewed posterior hip precautions, handout  Restrictions  Other Position/Activity Restrictions WBAT  Pain Assessment  Pain Assessment 0-10  Pain Score 0  Cognition  Arousal/Alertness Awake/alert  Behavior During Therapy WFL for tasks assessed/performed  Overall Cognitive Status Within Functional Limits for tasks assessed  Total Joint Exercises  Ankle Circles/Pumps AROM;Both;10 reps  Quad Sets AROM;Left;10 reps  Heel Slides AROM;15 reps  Hip ABduction/ADduction AROM;Left;15 reps  PT - End of Session  Activity Tolerance Patient tolerated treatment well  Patient left in bed;with bed alarm set;with call bell/phone within reach   PT - Assessment/Plan  PT Plan Current plan remains appropriate  PT Visit Diagnosis Other abnormalities of gait and mobility (R26.89);Difficulty in walking, not elsewhere classified (R26.2)  PT Frequency (ACUTE ONLY) 7X/week  Follow Up Recommendations Follow surgeon's recommendation for DC plan and follow-up therapies  PT equipment None recommended by PT  AM-PAC PT "6 Clicks" Mobility Outcome Measure (Version 2)  Help needed turning from your back to your side while in a flat bed without using bedrails? 3  Help needed moving from lying on your back to sitting on the side of a flat bed without using bedrails? 3  Help needed moving to and from a bed to a chair (including a  wheelchair)? 3  Help needed standing up from a chair using your arms (e.g., wheelchair or bedside chair)? 3  Help needed to walk in hospital room? 3  Help needed climbing 3-5 steps with a railing?  2  6 Click Score 17  Consider Recommendation of Discharge To: Home with [facility]  PT Goal Progression  Progress towards PT goals Progressing toward goals  Acute Rehab PT Goals  PT Goal Formulation With patient  Time For Goal Achievement 05/09/20  Potential to Achieve Goals Good  PT Time Calculation  PT Start Time (ACUTE ONLY) 1504  PT Stop Time (ACUTE ONLY) 1530  PT Time Calculation (min) (ACUTE ONLY) 26 min  PT General Charges  $$ ACUTE PT VISIT 1 Visit  PT Treatments  $Therapeutic Exercise 8-22 mins

## 2020-05-03 DIAGNOSIS — R710 Precipitous drop in hematocrit: Secondary | ICD-10-CM | POA: Diagnosis not present

## 2020-05-03 DIAGNOSIS — T8454XA Infection and inflammatory reaction due to internal left knee prosthesis, initial encounter: Secondary | ICD-10-CM | POA: Diagnosis not present

## 2020-05-03 DIAGNOSIS — Z9889 Other specified postprocedural states: Secondary | ICD-10-CM | POA: Diagnosis not present

## 2020-05-03 DIAGNOSIS — Z96652 Presence of left artificial knee joint: Secondary | ICD-10-CM | POA: Diagnosis not present

## 2020-05-03 LAB — CBC
HCT: 27.3 % — ABNORMAL LOW (ref 39.0–52.0)
Hemoglobin: 8.3 g/dL — ABNORMAL LOW (ref 13.0–17.0)
MCH: 25.9 pg — ABNORMAL LOW (ref 26.0–34.0)
MCHC: 30.4 g/dL (ref 30.0–36.0)
MCV: 85.3 fL (ref 80.0–100.0)
Platelets: 198 10*3/uL (ref 150–400)
RBC: 3.2 MIL/uL — ABNORMAL LOW (ref 4.22–5.81)
RDW: 15.8 % — ABNORMAL HIGH (ref 11.5–15.5)
WBC: 13 10*3/uL — ABNORMAL HIGH (ref 4.0–10.5)
nRBC: 0 % (ref 0.0–0.2)

## 2020-05-03 LAB — BASIC METABOLIC PANEL
Anion gap: 8 (ref 5–15)
BUN: 27 mg/dL — ABNORMAL HIGH (ref 8–23)
CO2: 22 mmol/L (ref 22–32)
Calcium: 8.1 mg/dL — ABNORMAL LOW (ref 8.9–10.3)
Chloride: 107 mmol/L (ref 98–111)
Creatinine, Ser: 1.29 mg/dL — ABNORMAL HIGH (ref 0.61–1.24)
GFR, Estimated: 57 mL/min — ABNORMAL LOW (ref >60)
Glucose, Bld: 110 mg/dL — ABNORMAL HIGH (ref 70–99)
Potassium: 4 mmol/L (ref 3.5–5.1)
Sodium: 137 mmol/L (ref 135–145)

## 2020-05-03 MED ORDER — CHLORHEXIDINE GLUCONATE CLOTH 2 % EX PADS
6.0000 | MEDICATED_PAD | Freq: Every day | CUTANEOUS | Status: DC
  Administered 2020-05-03 – 2020-05-04 (×2): 6 via TOPICAL

## 2020-05-03 MED ORDER — SODIUM CHLORIDE 0.9 % IV SOLN
900.0000 mg | Freq: Every day | INTRAVENOUS | Status: DC
  Administered 2020-05-03 – 2020-05-04 (×2): 900 mg via INTRAVENOUS
  Filled 2020-05-03 (×3): qty 18

## 2020-05-03 MED ORDER — SODIUM CHLORIDE 0.9 % IV SOLN
2.0000 g | INTRAVENOUS | Status: DC
  Administered 2020-05-03 – 2020-05-04 (×2): 2 g via INTRAVENOUS
  Filled 2020-05-03: qty 20
  Filled 2020-05-03: qty 2

## 2020-05-03 MED ORDER — TRANEXAMIC ACID 650 MG PO TABS (ORTHO)
1950.0000 mg | ORAL_TABLET | Freq: Once | ORAL | Status: AC
  Administered 2020-05-03: 1950 mg via ORAL
  Filled 2020-05-03: qty 3

## 2020-05-03 NOTE — Op Note (Signed)
NAMEJERALD, HENNINGTON MEDICAL RECORD NO: [id number] ACCOUNT NO: [id number] DATE OF BIRTH: [birth year] FACILITY: [facility]ATION: [facility] PHYSICIAN: Pietro Cassis. Alvan Dame, MD  Operative Report   DATE OF PROCEDURE: 05/01/2020   PREOPERATIVE DIAGNOSIS:  History of multiple surgical procedures involving his left hip including revision left hip replacement surgery complicated by a periprosthetic greater trochanteric femur fracture with previous 2 attempts at repair with persistent  lateral swelling and lateral leg erythema.  POSTOPERATIVE DIAGNOSIS:   1.  History of multiple surgical procedures involving his left hip including revision left hip replacement surgery complicated by a periprosthetic greater trochanteric femur fracture with previous 2 attempts at repair with persistent lateral swelling and  lateral leg erythema. 2.  Failed fixation of greater trochanteric femur fracture with loose hardware and nonunion of greater trochanter fracture.  DESCRIPTION OF PROCEDURE:   1.  Excisional and non-excisional debridement of left hip.  The non-excisional debridement included an incision that was probably 12 inches long including skin, nonviable tissue sharply excised with a combination of a scalpel and the Bovie cautery.   Additional excisional debridement included nonviable bone and soft tissues identified within the joint. 2.  Non-excisional debridement included irrigating the hip with 6 liters of normal saline solution as well as 450 mL of Irrisept, chlorhexidine base fluid as well as sterile Betadine base solution. 3.  Removal of left hip hardware including a cable claw plate and five cables.  SURGEON:  Dr. Paralee Cancel.  ASSISTANT:  Griffith Citron, PA-C.  Note, that Nehemiah Settle was present for the entirety of the case for preoperative positioning, perioperative management of the operative extremity, general facilitation of the case and primary wound closure.  ANESTHESIA:  General.   BLOOD LOSS:  About 2  liters.  BLOOD ADMINISTERED:  He received at least one unit of blood in the OR.  DRAINS:  One medium Hemovac was placed into the deep subcutaneous space.   SPECIMEN:  I did take a syringe full of fluid from the lateral hip to send to pathology for Gram stain evaluation.  INDICATIONS:  The patient is a very pleasant 75 year old male with a history of primary total hip arthroplasty complicated by early instability requiring revision surgery and subsequent development of further instability requiring revision surgery in  [place], [place].  He was progressing well in his normal state when he unfortunately had a fall in his home and sustained a significant periprosthetic greater trochanteric femur fracture that was identified to be within the sagittal plane.  His femoral  component was stable.  He has now undergone two attempts at repair of this large segment of bone.  He has recently been seen in the office and noted to have persistent erythema of the lateral side of his thigh with concern for persistent seroma and even  the concern for potential infection involving this seroma.  I felt that it was important based on the multiple surgeries he had to try to prevent infection as well as to identify what was going on inside the hip.  We reviewed the indications of this, the  risks identified, the purpose of the procedure reviewed extensively.  Consent was obtained for management.  PROCEDURE IN DETAIL:  The patient was brought to the operative theater.  Once adequate anesthesia, preoperative antibiotics, Ancef administered.  He was positioned into the right lateral decubitus position with the left hip up.  The left lower extremity  was then prepped and draped in sterile fashion.  A timeout was performed identifying the patient,  planned procedure, and extremity.  Once this was done, my initial indication was a seroma anticipating that his iliotibial band and fascial structures will  be intact and this was all  superficial based fluid.  As I incised the portion of incision initially, we identified a seromatous fluid, which I did aspirate this, sent to pathology.  As I further explored this, we identified that there was complete  dehiscence of his lateral iliotibial band and fascial structures with exposure of the lateral aspect of the hip.  For that reason, I extended sharply the incision as well as excising nonviable tissue and skin for the entire extent of his old incision,  which was at least 12 inches.  Once this was done, we further evacuated all the seroma fluid as well as any nonviable tissue in this area.  Once this was done and the lateral hip was exposed, identified that the greater trochanteric segment had pulled  away from his cable claw plate and the proximal aspect of the claw plate was loose with loose cables.  I spent some time at this point trying to identify the possibilities of management.  I did determine at this point that the claw plate was not  functioning in its appropriate manner and thus needed to be removed to attempt any other potential procedure.  For that reason, we removed the 5 cables that were holding the plate in place as well as the plate.  At this point, we irrigated the hip with 3  liters of normal saline solution.  There was no obvious purulence identified in this area.  Once I did this initial irrigation and as to be noted he had significant just punctate bleeding throughout the inflamed tissue over this hip.  I tried to  identify normalcy around this left hip.  This greater trochanteric fracture was identified to be more of a large oblique fracture that involved through the sagittal plane involving the greater trochanter down and through the anterior aspect of the  proximal femur.  It had broken into a second separate segment at this point. The distal segment and the lesser trochanteric segment the proximal femoral shaft metaphyseal region had evidence of bone healing, making  it very hard to identify the true  landmarks as well as the soft tissues around it.  Even trying to mobilize this tissue and identify where we were, I was not able to even pass the largest of the cable passers available around this area at this point, safely.  I then spent time trying to  mobilize this greater trochanter fracture.  Despite soft tissue debridements and exposure, I was unable to mobilize this fracture to get anywhere near the proximal aspect of the femur from its fracture site.  I felt that repeating the procedure that had  been performed now twice before would be sure sign of failure due to the significant forces pulling on this nonunited bone fragment.  For that reason, I felt that the primary goal at this point needed to be turned towards prevention of infection.  We had  prepared to treat infection with preservation of his components.  We irrigated the lateral aspect of his hip with sterile Betadine solution and then to try to prevent any soft tissue damage followed this with normal saline irrigant.  Following this, I  did use 450 mL of IrriSept, which we kept into the wound for about 5 minutes.  Once this was done, we again irrigated the hip with 3 liters of normal  saline solution, completing the non-excisional debridement.  Given the time spent trying to identify any  potential normalcy, any potential for repair, I determined at this point that there was not any way to do this.  His clinical presentation and gait likely had been the exact same as it would be postoperatively given the intraoperative findings and the  fact that the greater trochanteric segment had pulled away from the plate.  My primary focus at this point was infection prevention.  Following the procedures outlined above and the time that I gave trying to come up with a solution for him, we concluded  that the case would be completed and that we would try our best to reapproximate the fascial tissues, identified to be  significantly scarred in to reapproximate and protect the left hip.  Soft tissue planes were attempted to be identified based on  landmarks as well as old sutures.  Soft tissue planes were recreated and I reapproximated the iliotibial band and gluteal fascia as best as possible with a combination of interrupted #1 Vicryl sutures followed by an oversewing with barbed suture from  both directions.  I did place a medium Hemovac drain deep based on the significant amount of punctate bleeding inside the hip.  We then sprinkled vancomycin powder into the subcutaneous layer and then reapproximated the subcutaneous layer in layers with  2-0 Vicryl and a 3-0  Monocryl stitch with a distal nylon required.  The wound was then cleaned, dried and dressed sterilely with glue and Aquacel dressing.  Following this, the patient was then brought to the recovery room, extubated in stable condition.   Findings were reviewed with his wife.  They will be reviewed with him postoperatively.  We will get a PICC line with plans to do IV antibiotics for 4-6 weeks for joint prevention.  He will be in the hospital to work on mobilization as we work on this  postoperative plan.     Elián.Darby D: 05/03/2020 7:08:21 am T: 05/03/2020 8:30:00 am  JOB: [id number]/ [id number]

## 2020-05-03 NOTE — Plan of Care (Signed)
Plan of care reviewed and discussed with the patient. 

## 2020-05-03 NOTE — TOC Initial Note (Signed)
Transition of Care [facility]) - Initial/Assessment Note    Patient Details  Name: Jacob Mitchell MRN: [id number] Date of Birth: 1945/07/13  Transition of Care [facility]) CM/SW Contact:    Lennart Pall, LCSW Phone Number: 05/03/2020, 11:48 AM  Clinical Narrative:                 Met with pt and spouse to introduce self/ role.  Both very pleasant and wife confirms that she will be the caregiver in the home.  Also, notes that he has had home IV abx in the past and she feels comfortable managing this again.  Currently await ID recommendations for meds.  With pt/wife agreement, have alerted Carolynn Sayers, RN with [employer], to pt's case and she will follow for abx set up and education.   Expected Discharge Plan: [place] Barriers to Discharge: Continued Medical Work up   Patient Goals and CMS Choice Patient states their goals for this hospitalization and ongoing recovery are:: return home      Expected Discharge Plan and Services Expected Discharge Plan: [place] In-house Referral: Clinical Social Work     Living arrangements for the past 2 months: Single Family Home                                      Prior Living Arrangements/Services Living arrangements for the past 2 months: Single Family Home Lives with:: Spouse Patient language and need for interpreter reviewed:: No Do you feel safe going back to the place where you live?: Yes      Need for Family Participation in Patient Care: Yes (Comment) Care giver support system in place?: Yes (comment)   Criminal Activity/Legal Involvement Pertinent to Current Situation/Hospitalization: No - Comment as needed  Activities of Daily Living Home Assistive Devices/Equipment: Cane (specify quad or straight),Walker (specify type),Hearing aid ADL Screening (condition at time of admission) Patient's cognitive ability adequate to safely complete daily activities?: Yes Is the patient deaf or have difficulty  hearing?: Yes Does the patient have difficulty seeing, even when wearing glasses/contacts?: No Does the patient have difficulty concentrating, remembering, or making decisions?: No Patient able to express need for assistance with ADLs?: Yes Does the patient have difficulty dressing or bathing?: No Independently performs ADLs?: Yes (appropriate for developmental age) Does the patient have difficulty walking or climbing stairs?: Yes Weakness of Legs: Left Weakness of Arms/Hands: None  Permission Sought/Granted Permission sought to share information with : Family Supports Permission granted to share information with : Yes, Verbal Permission Granted  Share Information with NAME: Jacob Mitchell     Permission granted to share info w Relationship: spouse  Permission granted to share info w Contact Information: [phone]  Emotional Assessment Appearance:: Appears stated age Attitude/Demeanor/Rapport: Gracious,Self-Confident Affect (typically observed): Accepting Orientation: : Oriented to Self,Oriented to Place,Oriented to  Time,Oriented to Situation Alcohol / Substance Use: Not Applicable Psych Involvement: No (comment)  Admission diagnosis:  S/P revision of total knee, left [Z96.652] Patient Active Problem List   Diagnosis Date Noted  . 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 Greater trochanter fracture ([place]) 01/17/2020  . Cough 05/14/2018  . Insomnia 10/21/2017  . Obese 10/15/2016  . 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 History of nephrolithiasis 07/24/2016  . Neuropathy 07/24/2016  . S/P left THA, AA 11/23/2015   PCP:  [name], NP Pharmacy:  [facility] - [place], [place] - [address] [place] [address] Phone: [phone] Fax: [phone]     Social Determinants of Health (SDOH) Interventions    Readmission Risk Interventions Readmission Risk Prevention Plan 05/03/2020  Post Dischage  Appt Complete  Medication Screening Complete  Transportation Screening Complete  Some recent data might be hidden    

## 2020-05-03 NOTE — Progress Notes (Signed)
Physical Therapy Treatment Patient Details Name: Jacob Mitchell MRN: [id number] DOB: May 28, 1945 Today's Date: 05/03/2020    History of Present Illness Pt is a 75 year old male s/p I &D L hip seroma. PMH: multiple L hip surgeries, most recently  s/p Open reduction internal fixation of left periprosthetic fx fracture,  L DA THA and posterior left hip revision    PT Comments    Pt ambulated in hallway and performed a couple exercises.  Pt provided with posterior hip precautions handout and verbally reviewed precautions.  Pt anticipates d/c home tomorrow.   Follow Up Recommendations  Follow surgeon's recommendation for DC plan and follow-up therapies     Equipment Recommendations  None recommended by PT    Recommendations for Other Services       Precautions / Restrictions Precautions Precautions: Fall;Posterior Hip Precaution Comments: reviewed posterior hip precautions, handout Restrictions Other Position/Activity Restrictions: WBAT    Mobility  Bed Mobility               General bed mobility comments: pt sitting EOB on arrival, pt able to recall 2/3 hip precautions so reviewed these prior to mobilizing, also provided handout    Transfers Overall transfer level: Needs assistance Equipment used: Rolling walker (2 wheeled) Transfers: Sit to/from Stand Sit to Stand: Min guard         General transfer comment: cues for hand placement, THP--LLE position  Ambulation/Gait Ambulation/Gait assistance: Min guard Gait Distance (Feet): 90 Feet Assistive device: Rolling walker (2 wheeled) Gait Pattern/deviations: Decreased stance time - left;Decreased weight shift to left;Antalgic;Step-through pattern     General Gait Details: verbal cues for RW positioning, step length   Stairs             Wheelchair Mobility    Modified Rankin (Stroke Patients Only)       Balance                                            Cognition  Arousal/Alertness: Awake/alert Behavior During Therapy: WFL for tasks assessed/performed Overall Cognitive Status: Within Functional Limits for tasks assessed                                        Exercises Total Joint Exercises Ankle Circles/Pumps: AROM;Both;10 reps Quad Sets: AROM;Both;10 reps Hip ABduction/ADduction: AROM;Left;15 reps Long Arc Quad: AROM;Left;15 reps    General Comments        Pertinent Vitals/Pain Pain Assessment: No/denies pain    Home Living                      Prior Function            PT Goals (current goals can now be found in the care plan section) Progress towards PT goals: Progressing toward goals    Frequency    7X/week      PT Plan Current plan remains appropriate    Co-evaluation              AM-PAC PT "6 Clicks" Mobility   Outcome Measure  Help needed turning from your back to your side while in a flat bed without using bedrails?: A Little Help needed moving from lying on your back to sitting on the side of a flat bed  without using bedrails?: A Little Help needed moving to and from a bed to a chair (including a wheelchair)?: A Little Help needed standing up from a chair using your arms (e.g., wheelchair or bedside chair)?: A Little Help needed to walk in hospital room?: A Little Help needed climbing 3-5 steps with a railing? : A Lot 6 Click Score: 17    End of Session Equipment Utilized During Treatment: Gait belt Activity Tolerance: Patient tolerated treatment well Patient left: in chair;with call bell/phone within reach;with chair alarm set   PT Visit Diagnosis: Other abnormalities of gait and mobility (R26.89);Difficulty in walking, not elsewhere classified (R26.2)     Time: 6770-3403 PT Time Calculation (min) (ACUTE ONLY): 19 min  Charges:  $Gait Training: 8-22 mins                    Jannette Spanner PT, DPT Acute Rehabilitation Services Pager: [phone] Office:  [phone]  [place] Ram E 05/03/2020, 1:32 PM

## 2020-05-03 NOTE — Progress Notes (Signed)
Subjective: 2 Days Post-Op Procedure(s) (LRB): IRRIGATION AND DEBRIDEMENT LEFT HIP SEROMA (Left) Patient reports pain as mild.   Patient seen in rounds for Dr. Alvan Dame. Patient is resting in bed on exam this morning. He reports he is doing well. He ambulated 90+ feet with PT yesterday. He did get his PICC line yesterday. No acute events overnight.  We will continue therapy today.   Objective: Vital signs in last 24 hours: Temp:  [97.8 F (36.6 C)-98.2 F (36.8 C)] 97.8 F (36.6 C) (03/24 0538) Pulse Rate:  [71-76] 76 (03/24 0538) Resp:  [16-18] 16 (03/24 0538) BP: (105-125)/(58-78) 105/78 (03/24 0538) SpO2:  [92 %-99 %] 96 % (03/24 0538)  Intake/Output from previous day:  Intake/Output Summary (Last 24 hours) at 05/03/2020 0735 Last data filed at 05/03/2020 0600 Gross per 24 hour  Intake 2248.86 ml  Output 1781 ml  Net 467.86 ml     Intake/Output this shift: No intake/output data recorded.  Labs: Recent Labs    05/01/20 1351 05/01/20 1729 05/02/20 0855  HGB 12.3* 11.9* 9.2*   Recent Labs    05/01/20 1351 05/01/20 1729 05/02/20 0855  WBC 7.1  --  12.9*  RBC 4.78  --  3.59*  HCT 39.9 35.0* 30.5*  PLT 225  --  225   Recent Labs    05/01/20 1729 05/02/20 0855  NA 138 138  K 4.3 4.8  CL  --  105  CO2  --  24  BUN  --  23  CREATININE  --  1.18  GLUCOSE  --  134*  CALCIUM  --  8.4*   No results for input(s): LABPT, INR in the last 72 hours.  Exam: General - Patient is Alert and Oriented Extremity - Neurologically intact Sensation intact distally Intact pulses distally Dorsiflexion/Plantar flexion intact Dressing - Mild drainage about the distal incision. Hemovac in place with moderate output. Motor Function - intact, moving foot and toes well on exam.   Past Medical History:  Diagnosis Date  . Benign localized prostatic hyperplasia with lower urinary tract symptoms (LUTS)   . Complication of anesthesia    pt says he gets "restless" when he is  waking up and nurses have had to "hold him down"- happened several surgeries ago   . Diverticulosis of colon   . History of colon polyps   . History of kidney stones   . History of MRSA infection 2007   post right shoulder surgery  . History of urinary retention 05/31/2017   due to BPH  . Incomplete emptying of bladder   . OA (osteoarthritis)   . Peripheral neuropathy    feet-- hx frost bite  . Pinched nerve in neck    right index and middle fingers numbness  . Wears glasses   . Wears hearing aid in both ears     Assessment/Plan: 2 Days Post-Op Procedure(s) (LRB): IRRIGATION AND DEBRIDEMENT LEFT HIP SEROMA (Left) Active Problems:   S/P revision of total knee, left  Estimated body mass index is 33.8 kg/m as calculated from the following:   Height as of this encounter: 6\' 7"  (2.007 m).   Weight as of this encounter: 136.1 kg. Advance diet Up with therapy  DVT Prophylaxis - Aspirin Weight bearing as tolerated Hemovac left in place  Hip precautions discussed with patient  Plan is to go Home after hospital stay.   Patient had PICC line placed yesterday. Awaiting final culture results, no growth so far. We will appreciate input from  the ID team regarding antibiotic management.  Continue mobilizing with PT. We will change his dressing this afternoon or tomorrow prior to discharge.   Griffith Citron, PA-C Orthopedic Surgery [phone] 05/03/2020, 7:35 AM

## 2020-05-03 NOTE — Consult Note (Addendum)
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 male with history of left hip prosthesis, complicated by multiple revisions due to subluxation, HW ill fitting. Failed fiaxation. Most recently in the last 4-5 months, he had a fall in nov 2021 with left trochanteric femur fracture and underwent ORIF in Dec 2021 but found to have perprosthetic fracture requiring a second ORIF in mid Jan 2022. He initially had a seroma/hematoma post procedure requiring drainage but also developed some erythema to incision and swelling to left hip/thigh, had a few courses of oral abtx without improvement. Dr Alvan Dame took him to OR for  I x D on 3/22. OR samples sent - cultures thus far negative. He had hx of right shoulder septic arthritis roughly 15 years ago, "felt like MRSA' but doesn't recall abtx he received. He remains afebrile.   Past Medical History:  Diagnosis Date  . Benign localized prostatic hyperplasia with lower urinary tract symptoms (LUTS)   . Complication of anesthesia    pt says he gets "restless" when he is waking up and nurses have had to "hold him down"- happened several surgeries ago   . Diverticulosis of colon   . History of colon polyps   . History of kidney stones   . History of MRSA infection 2007   post right shoulder surgery  . History of urinary retention 05/31/2017   due to BPH  . Incomplete emptying of bladder   . OA (osteoarthritis)   . Peripheral neuropathy    feet-- hx frost bite  . Pinched nerve in neck    right index and middle fingers numbness  . Wears glasses   . Wears hearing aid in both ears     Allergies:  Allergies  Allergen Reactions  . Aspartame And Phenylalanine Palpitations    Artificial sweetener- hrt 140  went to ED     MEDICATIONS: . aspirin  81  mg Oral BID  . Chlorhexidine Gluconate Cloth  6 each Topical Daily  . Chlorhexidine Gluconate Cloth  6 each Topical Q0600  . docusate sodium  100 mg Oral BID  . ferrous sulfate  325 mg Oral TID PC  . fluticasone  2 spray Each Nare Daily    Social History   Tobacco Use  . Smoking status: Never Smoker  . Smokeless tobacco: Never Used  Vaping Use  . Vaping Use: Never used  Substance Use Topics  . Alcohol use: No  . Drug use: No    Family History  Problem Relation Age of Onset  . Alcohol abuse Father   . Drug abuse Brother     Review of Systems  Constitutional: Negative for fever, chills, diaphoresis, activity change, appetite change, fatigue and unexpected weight change.  HENT: Negative for congestion, sore throat, rhinorrhea, sneezing, trouble swallowing and sinus pressure.  Eyes: Negative for photophobia and visual disturbance.  Respiratory: Negative for cough, chest tightness, shortness of breath, wheezing and stridor.  Cardiovascular: Negative for chest pain, palpitations and leg swelling.  Gastrointestinal: Negative for nausea, vomiting, abdominal pain, diarrhea, constipation, blood in stool, abdominal distention and anal bleeding.  Genitourinary: Negative for dysuria, hematuria, flank pain and difficulty urinating.  Musculoskeletal: Negative for myalgias, back pain, joint swelling, arthralgias and  gait problem.  Skin: Negative for color change, pallor, rash and wound.  Neurological: Negative for dizziness, tremors, weakness and light-headedness.  Hematological: Negative for adenopathy. Does not bruise/bleed easily.  Psychiatric/Behavioral: Negative for behavioral problems, confusion, sleep disturbance, dysphoric mood, decreased concentration and agitation.     OBJECTIVE: Temp:  [97.8 F (36.6 C)] 97.8 F (36.6 C) (03/24 0538) Pulse Rate:  [72-76] 74 (03/24 1327) Resp:  [16-20] 20 (03/24 1327) BP: (105-125)/(66-78) 123/67 (03/24 1327) SpO2:  [96 %-98 %] 98 % (03/24  1327) Physical Exam  Constitutional: He is oriented to person, place, and time. He appears well-developed and well-nourished. No distress.  HENT:  Mouth/Throat: Oropharynx is clear and moist. No oropharyngeal exudate.  Cardiovascular: Normal rate, regular rhythm and normal heart sounds. Exam reveals no gallop and no friction rub.  No murmur heard.  Pulmonary/Chest: Effort normal and breath sounds normal. No respiratory distress. He has no wheezes.  Abdominal: Soft. Bowel sounds are normal. He exhibits no distension. There is no tenderness.  Ext = drain in place , some blood stain on dressing Neurological: He is alert and oriented to person, place, and time.  Skin: Skin is warm and dry. No rash noted. No erythema.  Psychiatric: He has a normal mood and affect. His behavior is normal.    LABS: Results for orders placed or performed during the hospital encounter of 05/01/20 (from the past 48 hour(s))  Aerobic/Anaerobic Culture w Gram Stain (surgical/deep wound)     Status: None (Preliminary result)   Collection Time: 05/01/20  4:54 PM   Specimen: Synovial, Left Hip; Body Fluid  Result Value Ref Range   Specimen Description      SYNOVIAL  LEFT HIP Performed at [facility], [place] [address] [place], [place] [address]    Special Requests      NONE Performed at [facility], [place] [address], [place], [place] [address]    Gram Stain      RARE WBC PRESENT, PREDOMINANTLY PMN NO ORGANISMS SEEN    Culture      NO GROWTH 2 DAYS NO ANAEROBES ISOLATED; CULTURE IN PROGRESS FOR 5 DAYS Performed at [place] [address], [place], [place] [address]    Report Status PENDING   Prepare RBC (crossmatch)     Status: None   Collection Time: 05/01/20  4:56 PM  Result Value Ref Range   Order Confirmation      ORDER PROCESSED BY BLOOD BANK Performed at [facility], [place] [address] [place], [place] [address]   I-STAT 7, (LYTES, BLD GAS, ICA,  H+H)     Status: Abnormal   Collection Time: 05/01/20  5:29 PM  Result Value Ref Range   pH, Arterial 7.326 (L) 7.350 - 7.450   pCO2 arterial 47.5 32.0 - 48.0 mmHg   pO2, Arterial 72 (L) 83.0 - 108.0 mmHg   Bicarbonate 24.8 20.0 - 28.0 mmol/L   TCO2 26 22 - 32 mmol/L   O2 Saturation 93.0 %   Acid-base deficit 2.0 0.0 - 2.0 mmol/L   Sodium 138 135 - 145 mmol/L   Potassium 4.3 3.5 - 5.1 mmol/L   Calcium, Ion 1.20 1.15 - 1.40 mmol/L   HCT 35.0 (L) 39.0 - 52.0 %   Hemoglobin 11.9 (L) 13.0 - 17.0 g/dL   Sample type ARTERIAL   CBC     Status: Abnormal   Collection Time: 05/02/20  8:55 AM  Result Value Ref Range   WBC  12.9 (H) 4.0 - 10.5 K/uL   RBC 3.59 (L) 4.22 - 5.81 MIL/uL   Hemoglobin 9.2 (L) 13.0 - 17.0 g/dL    Comment: REPEATED TO VERIFY DELTA CHECK NOTED    HCT 30.5 (L) 39.0 - 52.0 %   MCV 85.0 80.0 - 100.0 fL   MCH 25.6 (L) 26.0 - 34.0 pg   MCHC 30.2 30.0 - 36.0 g/dL   RDW 15.4 11.5 - 15.5 %   Platelets 225 150 - 400 K/uL   nRBC 0.0 0.0 - 0.2 %    Comment: Performed at [facility], [place] [address], [place], [place] [address]  Basic metabolic panel     Status: Abnormal   Collection Time: 05/02/20  8:55 AM  Result Value Ref Range   Sodium 138 135 - 145 mmol/L   Potassium 4.8 3.5 - 5.1 mmol/L   Chloride 105 98 - 111 mmol/L   CO2 24 22 - 32 mmol/L   Glucose, Bld 134 (H) 70 - 99 mg/dL    Comment: Glucose reference range applies only to samples taken after fasting for at least 8 hours.   BUN 23 8 - 23 mg/dL   Creatinine, Ser 1.18 0.61 - 1.24 mg/dL   Calcium 8.4 (L) 8.9 - 10.3 mg/dL   GFR, Estimated >60 >60 mL/min    Comment: (NOTE) Calculated using the CKD-EPI Creatinine Equation (2021)    Anion gap 9 5 - 15    Comment: Performed at [facility], [place] [address] [place], [place] [address]  CBC     Status: Abnormal   Collection Time: 05/03/20  8:37 AM  Result Value Ref Range   WBC 13.0 (H) 4.0 - 10.5 K/uL   RBC 3.20 (L) 4.22 -  5.81 MIL/uL   Hemoglobin 8.3 (L) 13.0 - 17.0 g/dL   HCT 27.3 (L) 39.0 - 52.0 %   MCV 85.3 80.0 - 100.0 fL   MCH 25.9 (L) 26.0 - 34.0 pg   MCHC 30.4 30.0 - 36.0 g/dL   RDW 15.8 (H) 11.5 - 15.5 %   Platelets 198 150 - 400 K/uL   nRBC 0.0 0.0 - 0.2 %    Comment: Performed at [facility], [place] [address] [place], [place] [address]  Basic metabolic panel     Status: Abnormal   Collection Time: 05/03/20  8:37 AM  Result Value Ref Range   Sodium 137 135 - 145 mmol/L   Potassium 4.0 3.5 - 5.1 mmol/L   Chloride 107 98 - 111 mmol/L   CO2 22 22 - 32 mmol/L   Glucose, Bld 110 (H) 70 - 99 mg/dL    Comment: Glucose reference range applies only to samples taken after fasting for at least 8 hours.   BUN 27 (H) 8 - 23 mg/dL   Creatinine, Ser 1.29 (H) 0.61 - 1.24 mg/dL   Calcium 8.1 (L) 8.9 - 10.3 mg/dL   GFR, Estimated 57 (L) >60 mL/min    Comment: (NOTE) Calculated using the CKD-EPI Creatinine Equation (2021)    Anion gap 8 5 - 15    Comment: Performed at [facility], [place] [address], [place], [place] [address]  No results found for: ESRSEDRATE, POCTSEDRATE   MICRO: reviewed IMAGING: [place] EKG SITE RITE  Result Date: 05/02/2020 If [facility] image not attached, placement could not be confirmed due to current cardiac rhythm.  Assessment/Plan:  75yo M with presumed left prosthetic hip infection in setting of hw loosening  -  will treat with daptomycin @ 8mg /kg daily and ceftriaxone 2gm iv daily - will check sed rate and crp - plan to treat for 6 wk - drain to be evaluated by dr Alvan Dame for removal over the next few days  Post -operative hemoglobin drop = has hx of requiring transfusion post surgery. Recommend repeat cbc to see if need transfusion vs. Iron supplementation

## 2020-05-04 DIAGNOSIS — Z96652 Presence of left artificial knee joint: Secondary | ICD-10-CM

## 2020-05-04 DIAGNOSIS — T8454XA Infection and inflammatory reaction due to internal left knee prosthesis, initial encounter: Secondary | ICD-10-CM | POA: Diagnosis not present

## 2020-05-04 DIAGNOSIS — R710 Precipitous drop in hematocrit: Secondary | ICD-10-CM

## 2020-05-04 DIAGNOSIS — Z9889 Other specified postprocedural states: Secondary | ICD-10-CM

## 2020-05-04 LAB — SEDIMENTATION RATE: Sed Rate: 6 mm/hr (ref 0–16)

## 2020-05-04 LAB — C-REACTIVE PROTEIN: CRP: 0.6 mg/dL (ref <1.0)

## 2020-05-04 LAB — CBC
HCT: 26.1 % — ABNORMAL LOW (ref 39.0–52.0)
Hemoglobin: 8.1 g/dL — ABNORMAL LOW (ref 13.0–17.0)
MCH: 26.4 pg (ref 26.0–34.0)
MCHC: 31 g/dL (ref 30.0–36.0)
MCV: 85 fL (ref 80.0–100.0)
Platelets: 181 10*3/uL (ref 150–400)
RBC: 3.07 MIL/uL — ABNORMAL LOW (ref 4.22–5.81)
RDW: 16.1 % — ABNORMAL HIGH (ref 11.5–15.5)
WBC: 7.5 10*3/uL (ref 4.0–10.5)
nRBC: 0 % (ref 0.0–0.2)

## 2020-05-04 LAB — CK: Total CK: 108 U/L (ref 49–397)

## 2020-05-04 MED ORDER — POLYETHYLENE GLYCOL 3350 17 G PO PACK: 17.0000 g | PACK | Freq: Every day | ORAL | 0 refills | Status: DC | PRN

## 2020-05-04 MED ORDER — DAPTOMYCIN IV (FOR PTA / DISCHARGE USE ONLY): 900.0000 mg | INTRAVENOUS | 0 refills | Status: DC

## 2020-05-04 MED ORDER — HYDROGEN PEROXIDE 3 % EX SOLN
CUTANEOUS | Status: AC
  Filled 2020-05-04: qty 473

## 2020-05-04 MED ORDER — HEPARIN SOD (PORK) LOCK FLUSH 100 UNIT/ML IV SOLN
250.0000 [IU] | INTRAVENOUS | Status: AC | PRN
  Administered 2020-05-04: 250 [IU]
  Filled 2020-05-04: qty 2.5

## 2020-05-04 MED ORDER — CEFTRIAXONE IV (FOR PTA / DISCHARGE USE ONLY): 2.0000 g | INTRAVENOUS | 0 refills | Status: DC

## 2020-05-04 MED ORDER — TRANEXAMIC ACID 650 MG PO TABS: 1950.0000 mg | ORAL_TABLET | Freq: Every day | ORAL | 0 refills | Status: AC

## 2020-05-04 NOTE — Progress Notes (Addendum)
PHARMACY CONSULT NOTE FOR:  OUTPATIENT  PARENTERAL ANTIBIOTIC THERAPY (OPAT)  Indication: Presumed hip infection Regimen: Daptomycin 900 mg IV Q 24 hours + Ceftriaxone 2 gm Q 24 hours End date: 06/14/2020  IV antibiotic discharge orders are pended. To discharging provider:  please sign these orders via discharge navigator,  Select New Orders & click on the button choice - Manage This Unsigned Work.     Thank you for allowing pharmacy to be a part of this patient's care.  Laurey Arrow  [facility] PharmD Candidate 2022 05/04/2020, 9:09 AM

## 2020-05-04 NOTE — Progress Notes (Signed)
[place] for Infectious Disease    Date of Admission:  05/01/2020   Total days of antibiotics 4           ID: Jacob Mitchell is a 75 y.o. male with  Active Problems:   S/P revision of total knee, left    Subjective: Afebrile. Looking forward to going home.  Medications:  . aspirin  81 mg Oral BID  . Chlorhexidine Gluconate Cloth  6 each Topical Daily  . Chlorhexidine Gluconate Cloth  6 each Topical Q0600  . docusate sodium  100 mg Oral BID  . ferrous sulfate  325 mg Oral TID PC  . fluticasone  2 spray Each Nare Daily  . hydrogen peroxide        Objective: Vital signs in last 24 hours: Temp:  [98.5 F (36.9 C)] 98.5 F (36.9 C) (03/24 2124) Pulse Rate:  [72-74] 72 (03/24 2124) Resp:  [18-20] 18 (03/24 2124) BP: (123-141)/(67) 141/67 (03/24 2124) SpO2:  [98 %-99 %] 99 % (03/24 2124) Physical Exam  Constitutional: He is oriented to person, place, and time. He appears well-developed and well-nourished. No distress.  HENT:  Mouth/Throat: Oropharynx is clear and moist. No oropharyngeal exudate.  Cardiovascular: Normal rate, regular rhythm and normal heart sounds. Exam reveals no gallop and no friction rub.  No murmur heard.  Pulmonary/Chest: Effort normal and breath sounds normal. No respiratory distress. He has no wheezes.  Ext: left hip, accordian drain in place. Dressing in place Neurological: He is alert and oriented to person, place, and time.  Skin: Skin is warm and dry. No rash noted. No erythema.  Psychiatric: He has a normal mood and affect. His behavior is normal.    Lab Results Recent Labs    05/02/20 0855 05/03/20 0837 05/04/20 0915  WBC 12.9* 13.0* 7.5  HGB 9.2* 8.3* 8.1*  HCT 30.5* 27.3* 26.1*  NA 138 137  --   K 4.8 4.0  --   CL 105 107  --   CO2 24 22  --   BUN 23 27*  --   CREATININE 1.18 1.29*  --    Liver Panel No results for input(s): PROT, ALBUMIN, AST, ALT, ALKPHOS, BILITOT, BILIDIR, IBILI in the last 72 hours. Sedimentation  Rate Recent Labs    05/04/20 0418  ESRSEDRATE 6   C-Reactive Protein Recent Labs    05/04/20 0418  CRP 0.6    Microbiology: Reviewed- ngtd Studies/Results: [place] EKG SITE RITE  Result Date: 05/02/2020 If [facility] image not attached, placement could not be confirmed due to current cardiac rhythm.    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 Daily. Will do weekly labs, including CK. Will give teaching instruction.  Will get follow up in clinic in 3 wk as well as 6 wk at completion of abtx  Anemia, post-op = hgb down to 8.3 from baseline of 12. Recommended to have him supplement with iron once he is home   Diagnosis: Presumed left hip pji Culture Result: culture negative  Allergies  Allergen Reactions  . Aspartame And Phenylalanine Palpitations    Artificial sweetener- hrt 140  went to ED    OPAT Orders Discharge antibiotics to be given via PICC line Discharge antibiotics: Per pharmacy protocol dapto plus ceftriaxone Aim for Vancomycin trough 15-20 or AUC 400-550 (unless otherwise indicated) Duration: 6 wk End Date: 06/14/2020  [facility] Care Per Protocol:  Home  health RN for IV administration and teaching; PICC line care and labs.    Labs weekly while on IV antibiotics: _x_ CBC with differential _x_ BMP _ sed rate and crp Q  _x_ CK  _x_ Please pull PIC at completion of IV antibiotics   Fax weekly labs to [phone]  Clinic Follow Up Appt: 3 and 6 wk with Marena Witts  @ [place] for Infectious Diseases Cell: [phone] Pager: [phone]  05/04/2020, 12:33 PM

## 2020-05-04 NOTE — TOC Transition Note (Signed)
Transition of Care [facility]) - CM/SW Discharge Note   Patient Details  Name: Jacob Mitchell MRN: [id number] Date of Birth: 1945-06-05  Transition of [facility]) CM/SW Contact:  Lennart Pall, LCSW Phone Number: 05/04/2020, 10:32 AM   Clinical Narrative:    Pt hopeful for dc today.  Home IV abx and [place] referrals completed (see below).  Wife to provide 24/7 support.  No further TOC needs.   Final next level of care: [place] Barriers to Discharge: Continued Medical Work up   Patient Goals and CMS Choice Patient states their goals for this hospitalization and ongoing recovery are:: return home      Discharge Placement                       Discharge Plan and Services In-house Referral: Clinical Social Work              DME Arranged: N/A DME Agency: NA       HH Arranged: RN,IV Antibiotics HH Agency: [place] Date [facility] Agency Contacted: 05/04/20 Time HH Agency Contacted: 0900 Representative spoke with at [place]: [place];  [place]  Social Determinants of Health (SDOH) Interventions     Readmission Risk Interventions Readmission Risk Prevention Plan 05/03/2020  Post Dischage Appt Complete  Medication Screening Complete  Transportation Screening Complete  Some recent data might be hidden

## 2020-05-04 NOTE — Progress Notes (Signed)
Physical Therapy Treatment Patient Details Name: Jacob Mitchell MRN: [id number] DOB: [birth year] Today's Date: 05/04/2020    History of Present Illness Pt is a 75 year old male s/p I &D L hip seroma. PMH: multiple L hip surgeries, most recently  s/p Open reduction internal fixation of left periprosthetic fx fracture,  L DA THA and posterior left hip revision    PT Comments    Pt wife present end of session. Reviewed THP again. Pt wife demo's good carryover and independence with cuing pt.  incr gait distance/tolerance today. Pt feels ready to d/c home   Follow Up Recommendations  Follow surgeon's recommendation for DC plan and follow-up therapies     Equipment Recommendations  None recommended by PT    Recommendations for Other Services       Precautions / Restrictions Precautions Precautions: Fall;Posterior Hip Precaution Comments: reviewed posterior hip precautions, handout Restrictions Weight Bearing Restrictions: No Other Position/Activity Restrictions: WBAT    Mobility  Bed Mobility Overal bed mobility: Needs Assistance Bed Mobility: Supine to Sit     Supine to sit: Supervision     General bed mobility comments: cues for THP, pt in s/l on arrival--educated in sleeping position/THP, pillow between LEs    Transfers Overall transfer level: Needs assistance Equipment used: Rolling walker (2 wheeled) Transfers: Sit to/from Stand Sit to Stand: Supervision         General transfer comment: cues for hand placement, THP--LLE position  Ambulation/Gait Ambulation/Gait assistance: Supervision;Min guard Gait Distance (Feet): 200 Feet Assistive device: Rolling walker (2 wheeled) Gait Pattern/deviations: Decreased stance time - left;Decreased weight shift to left;Step-through pattern     General Gait Details: verbal cues for RW positioning, step length, pt maintains LLE in external rotation adn slight knee flexion throughout gait cycle   Stairs              Wheelchair Mobility    Modified Rankin (Stroke Patients Only)       Balance                                            Cognition Arousal/Alertness: Awake/alert Behavior During Therapy: WFL for tasks assessed/performed Overall Cognitive Status: Within Functional Limits for tasks assessed                                        Exercises Total Joint Exercises Ankle Circles/Pumps: AROM;Both;10 reps Hip ABduction/ADduction: AROM;Left;10 reps Long Arc Quad: AROM;Left;15 reps    General Comments        Pertinent Vitals/Pain Pain Assessment: No/denies pain    Home Living                      Prior Function            PT Goals (current goals can now be found in the care plan section) Acute Rehab PT Goals Patient Stated Goal: be able to move around PT Goal Formulation: With patient Time For Goal Achievement: 05/09/20 Potential to Achieve Goals: Good Progress towards PT goals: Progressing toward goals    Frequency    7X/week      PT Plan Current plan remains appropriate    Co-evaluation              AM-PAC PT "6  Clicks" Mobility   Outcome Measure  Help needed turning from your back to your side while in a flat bed without using bedrails?: A Little Help needed moving from lying on your back to sitting on the side of a flat bed without using bedrails?: A Little Help needed moving to and from a bed to a chair (including a wheelchair)?: A Little Help needed standing up from a chair using your arms (e.g., wheelchair or bedside chair)?: A Little Help needed to walk in hospital room?: A Little Help needed climbing 3-5 steps with a railing? : A Little 6 Click Score: 18    End of Session Equipment Utilized During Treatment: Gait belt Activity Tolerance: Patient tolerated treatment well Patient left: in chair;with call bell/phone within reach;with family/visitor present   PT Visit Diagnosis: Other abnormalities  of gait and mobility (R26.89);Difficulty in walking, not elsewhere classified (R26.2)     Time: 1000-1022 PT Time Calculation (min) (ACUTE ONLY): 22 min  Charges:  $Gait Training: 8-22 mins                     Baxter Flattery, PT  Acute Rehab Dept ([place]) [phone] Pager [phone]  05/04/2020    [facility] 05/04/2020, 10:52 AM

## 2020-05-04 NOTE — Care Management Important Message (Signed)
Important Message  Patient Details IM Letter given to the Patient. Name: Jacob Mitchell MRN: [id number] Date of Birth: May 15, 1945   Medicare Important Message Given:  Yes     Kerin Salen 05/04/2020, 11:51 AM

## 2020-05-04 NOTE — Discharge Instructions (Signed)
INSTRUCTIONS AFTER SURGERY  - Empty hemovac drain twice daily. Please tape down the drain tubing to the leg to avoid it pulling out.    o Remove items at home which could result in a fall. This includes throw rugs or furniture in walking pathways o ICE to the affected joint every three hours while awake for 30 minutes at a time, for at least the first 3-5 days, and then as needed for pain and swelling.  Continue to use ice for pain and swelling. You may notice swelling that will progress down to the foot and ankle.  This is normal after surgery.  Elevate your leg when you are not up walking on it.   o Continue to use the breathing machine you got in the hospital (incentive spirometer) which will help keep your temperature down.  It is common for your temperature to cycle up and down following surgery, especially at night when you are not up moving around and exerting yourself.  The breathing machine keeps your lungs expanded and your temperature down.   DIET:  As you were doing prior to hospitalization, we recommend a well-balanced diet.  DRESSING / WOUND CARE / SHOWERING  You may shower 3 days after surgery, but keep the wounds dry during showering.  You may use an occlusive plastic wrap (Press'n Seal for example), NO SOAKING/SUBMERGING IN THE BATHTUB.  If the bandage gets wet, change with a clean dry gauze.  If the incision gets wet, pat the wound dry with a clean towel.  ACTIVITY  o Increase activity slowly as tolerated, but follow the weight bearing instructions below.   o No driving for 6 weeks or until further direction given by your physician.  You cannot drive while taking narcotics.  o No lifting or carrying greater than 10 lbs. until further directed by your surgeon. o Avoid periods of inactivity such as sitting longer than an hour when not asleep. This helps prevent blood clots.  o You may return to work once you are authorized by your doctor.     WEIGHT BEARING   Weight  bearing as tolerated with assist device (walker, cane, etc) as directed, use it as long as suggested by your surgeon or therapist, typically at least 4-6 weeks.   CONSTIPATION  Constipation is defined medically as fewer than three stools per week and severe constipation as less than one stool per week.  Even if you have a regular bowel pattern at home, your normal regimen is likely to be disrupted due to multiple reasons following surgery.  Combination of anesthesia, postoperative narcotics, change in appetite and fluid intake all can affect your bowels.   YOU MUST use at least one of the following options; they are listed in order of increasing strength to get the job done.  They are all available over the counter, and you may need to use some, POSSIBLY even all of these options:    Drink plenty of fluids (prune juice may be helpful) and high fiber foods Colace 100 mg by mouth twice a day  Senokot for constipation as directed and as needed Dulcolax (bisacodyl), take with full glass of water  Miralax (polyethylene glycol) once or twice a day as needed.  If you have tried all these things and are unable to have a bowel movement in the first 3-4 days after surgery call either your surgeon or your primary doctor.    If you experience loose stools or diarrhea, hold the medications until you stool  forms back up.  If your symptoms do not get better within 1 week or if they get worse, check with your doctor.  If you experience "the worst abdominal pain ever" or develop nausea or vomiting, please contact the office immediately for further recommendations for treatment.   ITCHING:  If you experience itching with your medications, try taking only a single pain pill, or even half a pain pill at a time.  You can also use Benadryl over the counter for itching or also to help with sleep.   TED HOSE STOCKINGS:  Use stockings on both legs until for at least 2 weeks or as directed by physician office. They may  be removed at night for sleeping.  MEDICATIONS:  See your medication summary on the "After Visit Summary" that nursing will review with you.  You may have some home medications which will be placed on hold until you complete the course of blood thinner medication.  It is important for you to complete the blood thinner medication as prescribed.  PRECAUTIONS:  If you experience chest pain or shortness of breath - call 911 immediately for transfer to the hospital emergency department.   If you develop a fever greater that 101 F, purulent drainage from wound, increased redness or drainage from wound, foul odor from the wound/dressing, or calf pain - CONTACT YOUR SURGEON.                                                   FOLLOW-UP APPOINTMENTS:  If you do not already have a post-op appointment, please call the office for an appointment to be seen by your surgeon.  Guidelines for how soon to be seen are listed in your "After Visit Summary", but are typically between 1-4 weeks after surgery.   MAKE SURE YOU:  . Understand these instructions.  . Get help right away if you are not doing well or get worse.    Thank you for letting us be a part of your medical care team.  It is a privilege we respect greatly.  We hope these instructions will help you stay on track for a fast and full recovery!

## 2020-05-04 NOTE — Progress Notes (Addendum)
Subjective: 3 Days Post-Op Procedure(s) (LRB): IRRIGATION AND DEBRIDEMENT LEFT HIP SEROMA (Left) Patient reports pain as mild.   Patient seen in rounds with Dr. Alvan Dame. Patient is resting in bed comfortably on exam this morning. He reports minimal discomfort. No acute events overnight. He ambulated 90+ feet with PT yesterday. He is highly motivated to get home today. Voiding without difficulty.  We will continue therapy today.   Objective: Vital signs in last 24 hours: Temp:  [98.5 F (36.9 C)] 98.5 F (36.9 C) (03/24 2124) Pulse Rate:  [72-74] 72 (03/24 2124) Resp:  [18-20] 18 (03/24 2124) BP: (123-141)/(67) 141/67 (03/24 2124) SpO2:  [98 %-99 %] 99 % (03/24 2124)  Intake/Output from previous day:  Intake/Output Summary (Last 24 hours) at 05/04/2020 0741 Last data filed at 05/04/2020 0630 Gross per 24 hour  Intake 2164.32 ml  Output 2360 ml  Net -195.68 ml     Intake/Output this shift: No intake/output data recorded.  Labs: Recent Labs    05/01/20 1351 05/01/20 1729 05/02/20 0855 05/03/20 0837  HGB 12.3* 11.9* 9.2* 8.3*   Recent Labs    05/02/20 0855 05/03/20 0837  WBC 12.9* 13.0*  RBC 3.59* 3.20*  HCT 30.5* 27.3*  PLT 225 198   Recent Labs    05/02/20 0855 05/03/20 0837  NA 138 137  K 4.8 4.0  CL 105 107  CO2 24 22  BUN 23 27*  CREATININE 1.18 1.29*  GLUCOSE 134* 110*  CALCIUM 8.4* 8.1*   No results for input(s): LABPT, INR in the last 72 hours.  Exam: General - Patient is Alert and Oriented Extremity - Neurologically intact Sensation intact distally Intact pulses distally Dorsiflexion/Plantar flexion intact Compartment soft Dressing - Aquacel and mepilex dressings removed. The distal incision was cleaned with peroxide, and dermabond reapplied. We then reapplied an Aquacel. His drain site was also re-dressed with mepilex.  Motor Function - intact, moving foot and toes well on exam.   Past Medical History:  Diagnosis Date   Benign localized  prostatic hyperplasia with lower urinary tract symptoms (LUTS)    Complication of anesthesia    pt says he gets "restless" when he is waking up and nurses have had to "hold him down"- happened several surgeries ago    Diverticulosis of colon    History of colon polyps    History of kidney stones    History of MRSA infection 2007   post right shoulder surgery   History of urinary retention 05/31/2017   due to BPH   Incomplete emptying of bladder    OA (osteoarthritis)    Peripheral neuropathy    feet-- hx frost bite   Pinched nerve in neck    right index and middle fingers numbness   Wears glasses    Wears hearing aid in both ears     Assessment/Plan: 3 Days Post-Op Procedure(s) (LRB): IRRIGATION AND DEBRIDEMENT LEFT HIP SEROMA (Left) Active Problems:   S/P revision of total knee, left  Estimated body mass index is 33.8 kg/m as calculated from the following:   Height as of this encounter: 6\' 7"  (2.007 m).   Weight as of this encounter: 136.1 kg. Advance diet Up with therapy  DVT Prophylaxis - Aspirin, however we will hold off on aspirin over the weekend due to significant drain output. We will reassess on Monday.  Weight bearing as tolerated Hemovac left in place Hip precautions discussed with patient  Plan is to go Home after hospital stay.   Patient  with acute blood loss anemia due to recent surgery. Hgb 8.3 yesterday. He did receive intra-op blood transfusion. We will repeat a CBC this morning. If his hemoglobin is stable, he may discharge home. Otherwise, he may need a transfusion prior to going home this evening.   He and his wife will need to be instructed on hemovac care. He should tape the drain down to avoid it sliding out, and may dump the drain twice daily. He is aware he will need to avoid getting the dressing wet.   Follow up in our office on Monday.   Griffith Citron, PA-C Orthopedic Surgery [phone] 05/04/2020, 7:41 AM

## 2020-05-05 DIAGNOSIS — M9702XD Periprosthetic fracture around internal prosthetic left hip joint, subsequent encounter: Secondary | ICD-10-CM | POA: Diagnosis not present

## 2020-05-05 DIAGNOSIS — M96842 Postprocedural seroma of a musculoskeletal structure following a musculoskeletal system procedure: Secondary | ICD-10-CM | POA: Diagnosis not present

## 2020-05-05 DIAGNOSIS — Z79899 Other long term (current) drug therapy: Secondary | ICD-10-CM | POA: Diagnosis not present

## 2020-05-05 DIAGNOSIS — G629 Polyneuropathy, unspecified: Secondary | ICD-10-CM | POA: Diagnosis not present

## 2020-05-05 DIAGNOSIS — Z452 Encounter for adjustment and management of vascular access device: Secondary | ICD-10-CM | POA: Diagnosis not present

## 2020-05-05 DIAGNOSIS — Z9181 History of falling: Secondary | ICD-10-CM | POA: Diagnosis not present

## 2020-05-05 DIAGNOSIS — Z6834 Body mass index (BMI) 34.0-34.9, adult: Secondary | ICD-10-CM | POA: Diagnosis not present

## 2020-05-05 DIAGNOSIS — S72112D Displaced fracture of greater trochanter of left femur, subsequent encounter for closed fracture with routine healing: Secondary | ICD-10-CM | POA: Diagnosis not present

## 2020-05-05 DIAGNOSIS — Z8614 Personal history of Methicillin resistant Staphylococcus aureus infection: Secondary | ICD-10-CM | POA: Diagnosis not present

## 2020-05-05 DIAGNOSIS — K5730 Diverticulosis of large intestine without perforation or abscess without bleeding: Secondary | ICD-10-CM | POA: Diagnosis not present

## 2020-05-05 DIAGNOSIS — Z792 Long term (current) use of antibiotics: Secondary | ICD-10-CM | POA: Diagnosis not present

## 2020-05-05 DIAGNOSIS — R338 Other retention of urine: Secondary | ICD-10-CM | POA: Diagnosis not present

## 2020-05-05 DIAGNOSIS — Z87442 Personal history of urinary calculi: Secondary | ICD-10-CM | POA: Diagnosis not present

## 2020-05-05 DIAGNOSIS — E669 Obesity, unspecified: Secondary | ICD-10-CM | POA: Diagnosis not present

## 2020-05-05 DIAGNOSIS — N401 Enlarged prostate with lower urinary tract symptoms: Secondary | ICD-10-CM | POA: Diagnosis not present

## 2020-05-05 DIAGNOSIS — Z981 Arthrodesis status: Secondary | ICD-10-CM | POA: Diagnosis not present

## 2020-05-05 DIAGNOSIS — Z86010 Personal history of colonic polyps: Secondary | ICD-10-CM | POA: Diagnosis not present

## 2020-05-05 DIAGNOSIS — G4700 Insomnia, unspecified: Secondary | ICD-10-CM | POA: Diagnosis not present

## 2020-05-05 LAB — TYPE AND SCREEN
ABO/RH(D): O POS
Antibody Screen: NEGATIVE
Unit division: 0
Unit division: 0

## 2020-05-05 LAB — BPAM RBC
Blood Product Expiration Date: 202204162359
Blood Product Expiration Date: 202204192359
ISSUE DATE / TIME: 202203221723
ISSUE DATE / TIME: 202203221723
Unit Type and Rh: 5100
Unit Type and Rh: 5100

## 2020-05-06 LAB — AEROBIC/ANAEROBIC CULTURE W GRAM STAIN (SURGICAL/DEEP WOUND): Culture: NO GROWTH

## 2020-05-07 ENCOUNTER — Telehealth: Payer: Self-pay

## 2020-05-07 NOTE — Telephone Encounter (Signed)
Called patient to get an appointment scheduled with Dr. Baxter Flattery or another provider in 3 weeks, and then 6 weeks, left a voicemail for patient to call back and get that scheduled.

## 2020-05-08 DIAGNOSIS — M009 Pyogenic arthritis, unspecified: Secondary | ICD-10-CM | POA: Diagnosis not present

## 2020-05-13 NOTE — Discharge Summary (Signed)
Physician Discharge Summary   Patient ID: Jacob Mitchell MRN: [id number] DOB/AGE: June 01, 1945 75 y.o.  Admit date: 05/01/2020 Discharge date: 05/04/2020  Primary Diagnosis: History of multiple surgical procedures involving his left hip including revision left hip replacement surgery complicated by a periprosthetic greater trochanteric femur fracture with previous 2 attempts at repair with persistent  lateral swelling and lateral leg erythema.  Admission Diagnoses:  Past Medical History:  Diagnosis Date  . Benign localized prostatic hyperplasia with lower urinary tract symptoms (LUTS)   . Complication of anesthesia    pt says he gets "restless" when he is waking up and nurses have had to "hold him down"- happened several surgeries ago   . Diverticulosis of colon   . History of colon polyps   . History of kidney stones   . History of MRSA infection 2007   post right shoulder surgery  . History of urinary retention 05/31/2017   due to BPH  . Incomplete emptying of bladder   . OA (osteoarthritis)   . Peripheral neuropathy    feet-- hx frost bite  . Pinched nerve in neck    right index and middle fingers numbness  . Wears glasses   . Wears hearing aid in both ears    Discharge Diagnoses:   Active Problems:   S/P revision of total knee, left  Estimated body mass index is 33.8 kg/m as calculated from the following:   Height as of this encounter: $RemoveBeforeD'6\' 7"'[id number]$  (2.007 m).   Weight as of this encounter: 136.1 kg.  Procedure:  Procedure(s) (LRB): IRRIGATION AND DEBRIDEMENT LEFT HIP SEROMA (Left)   Consults: ID  HPI: The patient is a very pleasant 75 year old male with a history of primary total hip arthroplasty complicated by early instability requiring revision surgery and subsequent development of further instability requiring revision surgery in  [place], [place].  He was progressing well in his normal state when he unfortunately had a fall in his home and sustained a significant  periprosthetic greater trochanteric femur fracture that was identified to be within the sagittal plane.  His femoral  component was stable.  He has now undergone two attempts at repair of this large segment of bone.  He has recently been seen in the office and noted to have persistent erythema of the lateral side of his thigh with concern for persistent seroma and even  the concern for potential infection involving this seroma.  I felt that it was important based on the multiple surgeries he had to try to prevent infection as well as to identify what was going on inside the hip.  We reviewed the indications of this, the  risks identified, the purpose of the procedure reviewed extensively.  Consent was obtained for management.  Laboratory Data: Admission on 05/01/2020, Discharged on 05/04/2020  Component Date Value Ref Range Status  . WBC 05/01/2020 7.1  4.0 - 10.5 K/uL Final  . RBC 05/01/2020 4.78  4.22 - 5.81 MIL/uL Final  . Hemoglobin 05/01/2020 12.3* 13.0 - 17.0 g/dL Final  . HCT 05/01/2020 39.9  39.0 - 52.0 % Final  . MCV 05/01/2020 83.5  80.0 - 100.0 fL Final  . MCH 05/01/2020 25.7* 26.0 - 34.0 pg Final  . MCHC 05/01/2020 30.8  30.0 - 36.0 g/dL Final  . RDW 05/01/2020 15.6* 11.5 - 15.5 % Final  . Platelets 05/01/2020 225  150 - 400 K/uL Final  . nRBC 05/01/2020 0.0  0.0 - 0.2 % Final   Performed at [facility], [address] [place], [place] [address]  . ABO/RH(D) 05/01/2020 O POS   Final  . Antibody Screen 05/01/2020 NEG   Final  . Sample Expiration 05/01/2020 05/04/2020,2359   Final  . Unit Number 05/01/2020 [id number]   Final  . Blood Component Type 05/01/2020 RED CELLS,LR   Final  . Unit division 05/01/2020 00   Final  . Status of Unit 05/01/2020 REL FROM [facility]   Final  . Transfusion Status 05/01/2020 OK TO TRANSFUSE   Final  . Crossmatch Result 05/01/2020    Final                   Value:Compatible Performed at [facility], [address] [place], [place] [address]   . Unit Number 05/01/2020 [id number]   Final  . Blood Component Type 05/01/2020 RED CELLS,LR   Final  . Unit division 05/01/2020 00   Final  . Status of Unit 05/01/2020 REL FROM [facility]   Final  . Transfusion Status 05/01/2020 OK TO TRANSFUSE   Final  . Crossmatch Result 05/01/2020 Compatible   Final  . MRSA, PCR 05/01/2020 NEGATIVE  NEGATIVE Final  . Staphylococcus aureus 05/01/2020 NEGATIVE  NEGATIVE Final   Comment: (NOTE) The Xpert SA Assay (FDA approved for NASAL specimens in patients 77 years of age and older), is one component of a comprehensive surveillance program. It is not intended to diagnose infection nor to guide or monitor treatment. Performed at [facility], [address], [place], [place] [address]   . Specimen Description 05/01/2020    Final                   Value:SYNOVIAL  LEFT HIP Performed at [facility], [address], [place], [place] [address]   . Special Requests 05/01/2020    Final                   Value:NONE Performed at [facility], [address] [place], [place] [address]   . Gram Stain 05/01/2020    Final                   Value:RARE WBC PRESENT, PREDOMINANTLY PMN NO ORGANISMS SEEN   . Culture 05/01/2020    Final                   Value:No growth aerobically or anaerobically. Performed at [facility], [address], [place], [place] [address]   . Report Status 05/01/2020 05/06/2020 FINAL   Final  . Order Confirmation 05/01/2020    Final                   Value:ORDER PROCESSED BY BLOOD BANK Performed at [facility], [address] [place], [place] [address]   . ISSUE DATE / TIME 05/01/2020 [id number]   Final  . Blood Product Unit Number 05/01/2020 [id number]   Final  . PRODUCT CODE 05/01/2020 [id number]   Final  . Unit Type and Rh 05/01/2020 5100   Final  . Blood Product Expiration Date 05/01/2020 [id number]   Final  .  ISSUE DATE / TIME 05/01/2020 [id number]   Final  . Blood Product Unit Number 05/01/2020 [id number]   Final  . PRODUCT CODE 05/01/2020 [id number]   Final  . Unit Type and Rh 05/01/2020 5100   Final  . Blood Product Expiration Date 05/01/2020 [id number]   Final  . pH, Arterial  05/01/2020 7.326* 7.350 - 7.450 Final  . pCO2 arterial 05/01/2020 47.5  32.0 - 48.0 mmHg Final  . pO2, Arterial 05/01/2020 72* 83.0 - 108.0 mmHg Final  . Bicarbonate 05/01/2020 24.8  20.0 - 28.0 mmol/L Final  . TCO2 05/01/2020 26  22 - 32 mmol/L Final  . O2 Saturation 05/01/2020 93.0  % Final  . Acid-base deficit 05/01/2020 2.0  0.0 - 2.0 mmol/L Final  . Sodium 05/01/2020 138  135 - 145 mmol/L Final  . Potassium 05/01/2020 4.3  3.5 - 5.1 mmol/L Final  . Calcium, Ion 05/01/2020 1.20  1.15 - 1.40 mmol/L Final  . HCT 05/01/2020 35.0* 39.0 - 52.0 % Final  . Hemoglobin 05/01/2020 11.9* 13.0 - 17.0 g/dL Final  . Sample type 05/01/2020 ARTERIAL   Final  . WBC 05/02/2020 12.9* 4.0 - 10.5 K/uL Final  . RBC 05/02/2020 3.59* 4.22 - 5.81 MIL/uL Final  . Hemoglobin 05/02/2020 9.2* 13.0 - 17.0 g/dL Final   Comment: REPEATED TO VERIFY DELTA CHECK NOTED   . HCT 05/02/2020 30.5* 39.0 - 52.0 % Final  . MCV 05/02/2020 85.0  80.0 - 100.0 fL Final  . MCH 05/02/2020 25.6* 26.0 - 34.0 pg Final  . MCHC 05/02/2020 30.2  30.0 - 36.0 g/dL Final  . RDW 05/02/2020 15.4  11.5 - 15.5 % Final  . Platelets 05/02/2020 225  150 - 400 K/uL Final  . nRBC 05/02/2020 0.0  0.0 - 0.2 % Final   Performed at [facility], [place] [address], [place], [place] [address]  . Sodium 05/02/2020 138  135 - 145 mmol/L Final  . Potassium 05/02/2020 4.8  3.5 - 5.1 mmol/L Final  . Chloride 05/02/2020 105  98 - 111 mmol/L Final  . CO2 05/02/2020 24  22 - 32 mmol/L Final  . Glucose, Bld 05/02/2020 134* 70 - 99 mg/dL Final   Glucose reference range applies only to samples taken after fasting for at least 8 hours.  . BUN 05/02/2020 23  8 - 23  mg/dL Final  . Creatinine, Ser 05/02/2020 1.18  0.61 - 1.24 mg/dL Final  . Calcium 05/02/2020 8.4* 8.9 - 10.3 mg/dL Final  . GFR, Estimated 05/02/2020 >60  >60 mL/min Final   Comment: (NOTE) Calculated using the CKD-EPI Creatinine Equation (2021)   . Anion gap 05/02/2020 9  5 - 15 Final   Performed at [facility], [place] [address], [place], [place] [address]  . WBC 05/03/2020 13.0* 4.0 - 10.5 K/uL Final  . RBC 05/03/2020 3.20* 4.22 - 5.81 MIL/uL Final  . Hemoglobin 05/03/2020 8.3* 13.0 - 17.0 g/dL Final  . HCT 05/03/2020 27.3* 39.0 - 52.0 % Final  . MCV 05/03/2020 85.3  80.0 - 100.0 fL Final  . MCH 05/03/2020 25.9* 26.0 - 34.0 pg Final  . MCHC 05/03/2020 30.4  30.0 - 36.0 g/dL Final  . RDW 05/03/2020 15.8* 11.5 - 15.5 % Final  . Platelets 05/03/2020 198  150 - 400 K/uL Final  . nRBC 05/03/2020 0.0  0.0 - 0.2 % Final   Performed at [facility], [place] [address], [place], [place] [address]  . Sodium 05/03/2020 137  135 - 145 mmol/L Final  . Potassium 05/03/2020 4.0  3.5 - 5.1 mmol/L Final  . Chloride 05/03/2020 107  98 - 111 mmol/L Final  . CO2 05/03/2020 22  22 - 32 mmol/L Final  . Glucose, Bld 05/03/2020 110* 70 - 99 mg/dL Final   Glucose reference range applies only  to samples taken after fasting for at least 8 hours.  . BUN 05/03/2020 27* 8 - 23 mg/dL Final  . Creatinine, Ser 05/03/2020 1.29* 0.61 - 1.24 mg/dL Final  . Calcium 05/03/2020 8.1* 8.9 - 10.3 mg/dL Final  . GFR, Estimated 05/03/2020 57* >60 mL/min Final   Comment: (NOTE) Calculated using the CKD-EPI Creatinine Equation (2021)   . Anion gap 05/03/2020 8  5 - 15 Final   Performed at [facility], [place] [address], [place], [place] [address]  . Sed Rate 05/04/2020 6  0 - 16 mm/hr Final   Performed at [facility], [place] [address] [place], [place] [address]  . CRP 05/04/2020 0.6  <1.0 mg/dL Final   Performed at [place] [address] [place], [place] [address]  . Total CK 05/04/2020 108  49 - 397 U/L Final   Performed at [facility], [place] [address], [place], [place] [address]  . WBC 05/04/2020 7.5  4.0 - 10.5 K/uL Final  . RBC 05/04/2020 3.07* 4.22 - 5.81 MIL/uL Final  . Hemoglobin 05/04/2020 8.1* 13.0 - 17.0 g/dL Final  . HCT 05/04/2020 26.1* 39.0 - 52.0 % Final  . MCV 05/04/2020 85.0  80.0 - 100.0 fL Final  . MCH 05/04/2020 26.4  26.0 - 34.0 pg Final  . MCHC 05/04/2020 31.0  30.0 - 36.0 g/dL Final  . RDW 05/04/2020 16.1* 11.5 - 15.5 % Final  . Platelets 05/04/2020 181  150 - 400 K/uL Final  . nRBC 05/04/2020 0.0  0.0 - 0.2 % Final   Performed at [facility], [place] [address], [place], [place] [address]     X-Rays:US EKG SITE RITE  Result Date: 05/02/2020 If [facility] image not attached, placement could not be confirmed due to current cardiac rhythm.  [place] EKG SITE RITE  Result Date: 05/01/2020 If [facility] image not attached, placement could not be confirmed due to current cardiac rhythm.   EKG:No orders found for this or any previous visit.   Hospital Course: Jacob Mitchell is a 75 y.o. who was admitted to [facility]. They were brought to the operating room on 05/01/2020 and underwent Procedure(s): IRRIGATION AND DEBRIDEMENT LEFT HIP SEROMA.  Patient tolerated the procedure well and was later transferred to the recovery room and then to the orthopaedic floor for postoperative care. They were given PO and IV analgesics for pain control following their surgery. They were given 24 hours of postoperative antibiotics of  Anti-infectives (From admission, onward)   Start     Dose/Rate Route Frequency Ordered Stop   05/04/20 0000  cefTRIAXone (ROCEPHIN) IVPB        2 g Intravenous Every 24 hours 05/04/20 1029 06/14/20 2359   05/04/20 0000  daptomycin (CUBICIN) IVPB        900 mg Intravenous Every 24 hours 05/04/20 1029 06/14/20 2359   05/03/20 1630  DAPTOmycin  (CUBICIN) 900 mg in sodium chloride 0.9 % IVPB  Status:  Discontinued        900 mg 236 mL/hr over 30 Minutes Intravenous Daily 05/03/20 1537 05/04/20 2044   05/03/20 1630  cefTRIAXone (ROCEPHIN) 2 g in sodium chloride 0.9 % 100 mL IVPB  Status:  Discontinued        2 g 200 mL/hr over 30 Minutes Intravenous Every 24 hours 05/03/20 1537 05/04/20 2044   05/02/20 0000  ceFAZolin (ANCEF) IVPB 2g/100 mL premix  Status:  Discontinued  2 g 200 mL/hr over 30 Minutes Intravenous Every 8 hours 05/01/20 1930 05/03/20 1537   05/01/20 1745  vancomycin (VANCOCIN) powder  Status:  Discontinued          As needed 05/01/20 1746 05/01/20 1930   05/01/20 0600  ceFAZolin (ANCEF) 3 g in dextrose 5 % 50 mL IVPB        3 g 100 mL/hr over 30 Minutes Intravenous On call to O.R. 04/30/20 3474 05/01/20 1544     and started on DVT prophylaxis in the form of Aspirin.   PT and OT were ordered for total joint protocol. Discharge planning consulted to help with postop disposition and equipment needs. Patient had a good night on the evening of surgery. They started to get up OOB with therapy on POD #0.  PICC line placed on POD #1. He continued working with therapy. Continued to work with therapy into POD #2. Infectious disease was consulted, and recommended home ABX regimen of daptomycin and ceftriaxone. Pt was seen during rounds on day three and was ready to go home pending progress with therapy. Pt worked with therapy for one additional session and was meeting their goals. He was discharged to home later that day in stable condition.  Diet: Regular diet Activity: WBAT Follow-up: in 2 weeks Disposition: Home Discharged Condition: good   Discharge Instructions    [facility] pharmacist to adjust dose for Vancomycin, Aminoglycosides and other anti-infective therapies as requested by physician.   Complete by: As directed    [facility] to provide Cath Flo 2mg    Complete by: As directed     Administer for PICC line occlusion and as ordered by physician for other access device issues.   Anaphylaxis Kit: Provided to treat any anaphylactic reaction to the medication being provided to the patient if First Dose or when requested by physician   Complete by: As directed    Epinephrine 1mg /ml vial / amp: Administer 0.3mg  (0.19ml) subcutaneously once for moderate to severe anaphylaxis, nurse to call physician and pharmacy when reaction occurs and call 911 if needed for immediate care   Diphenhydramine 50mg /ml IV vial: Administer 25-50mg  IV/IM PRN for first dose reaction, rash, itching, mild reaction, nurse to call physician and pharmacy when reaction occurs   Sodium Chloride 0.9% NS 59ml IV: Administer if needed for hypovolemic blood pressure drop or as ordered by physician after call to physician with anaphylactic reaction   Call MD / Call 911   Complete by: As directed    If you experience chest pain or shortness of breath, CALL 911 and be transported to the hospital emergency room.  If you develope a fever above 101 F, pus (white drainage) or increased drainage or redness at the wound, or calf pain, call your surgeon's office.   Change dressing   Complete by: As directed    Maintain surgical dressing until follow up in the clinic. If the edges start to pull up, may reinforce with tape. If the dressing is no longer working, may remove and cover with gauze and tape, but must keep the area dry and clean.  Call with any questions or concerns.   Change dressing on IV access line weekly and PRN   Complete by: As directed    Constipation Prevention   Complete by: As directed    Drink plenty of fluids.  Prune juice may be helpful.  You may use a stool softener, such as Colace (over the counter) 100 mg twice a  day.  Use MiraLax (over the counter) for constipation as needed.   Diet - low sodium heart healthy   Complete by: As directed    Discharge instructions   Complete by: As directed     Maintain surgical dressing until follow up in the clinic. If the edges start to pull up, may reinforce with tape. If the dressing is no longer working, may remove and cover with gauze and tape, but must keep the area dry and clean.  Follow up in 2 weeks at [facility] Call with any questions or concerns.   Flush IV access with Sodium Chloride 0.9% and Heparin 10 units/ml or 100 units/ml   Complete by: As directed    Home infusion instructions - [facility]   Complete by: As directed    Instructions: Flush IV access with Sodium Chloride 0.9% and Heparin 10units/ml or 100units/ml   Change dressing on IV access line: Weekly and PRN   Instructions Cath Flo $Remove'2mg'ceFyhVY$ : Administer for PICC Line occlusion and as ordered by physician for other access device   [facility] pharmacist to adjust dose for: Vancomycin, Aminoglycosides and other anti-infective therapies as requested by physician   Increase activity slowly as tolerated   Complete by: As directed    Weight bearing as tolerated with assist device (walker, cane, etc) as directed, use it as long as suggested by your surgeon or therapist, typically at least 4-6 weeks.   Method of administration may be changed at the discretion of home infusion pharmacist based upon assessment of the patient and/or caregiver's ability to self-administer the medication ordered   Complete by: As directed    TED hose   Complete by: As directed    Use stockings (TED hose) for 2 weeks on both leg(s).  You may remove them at night for sleeping.     Allergies as of 05/04/2020      Reactions   Aspartame And Phenylalanine Palpitations   Artificial sweetener- hrt 140  went to ED      Medication List    STOP taking these medications   amoxicillin-clavulanate 875-125 MG tablet Commonly known as: AUGMENTIN   ciprofloxacin 500 MG tablet Commonly known as: CIPRO     TAKE these medications   cefTRIAXone  IVPB Commonly known as: ROCEPHIN Inject 2 g  into the vein daily. Indication:  Presumed hip infection First Dose: Yes Last Day of Therapy:  06/14/2020 Labs - Once weekly:  CBC/D and BMP, Labs - Every other week:  ESR and CRP Method of administration: IV Push Method of administration may be changed at the discretion of home infusion pharmacist based upon assessment of the patient and/or caregiver's ability to self-administer the medication ordered.   daptomycin  IVPB Commonly known as: CUBICIN Inject 900 mg into the vein daily. Indication:  Presumed hip infection First Dose: Yes Last Day of Therapy:  06/14/2020 Labs - Once weekly:  CBC/D, BMP, and CPK Labs - Every other week:  ESR and CRP Method of administration: IV Push Method of administration may be changed at the discretion of home infusion pharmacist based upon assessment of the patient and/or caregiver's ability to self-administer the medication ordered.   docusate sodium 100 MG capsule Commonly known as: Colace Take 1 capsule (100 mg total) by mouth 2 (two) times daily.   fluticasone 50 MCG/ACT nasal spray Commonly known as: FLONASE Place 2 sprays into both nostrils daily.   polyethylene glycol 17 g packet Commonly known as: MIRALAX / GLYCOLAX Take  17 g by mouth daily as needed for mild constipation. What changed:   when to take this  reasons to take this   prednisoLONE acetate 1 % ophthalmic suspension Commonly known as: PRED FORTE Place 1 drop into the right eye 3 (three) times daily.   traZODone 100 MG tablet Commonly known as: DESYREL Take 1 tablet (100 mg total) by mouth at bedtime as needed.   vitamin C 1000 MG tablet Take 1,000 mg by mouth daily.   VITAMIN D3 PO Take 1 tablet by mouth daily.   ZINC PO Take 1 tablet by mouth daily.     ASK your doctor about these medications   tranexamic acid 650 MG Tabs tablet Commonly known as: LYSTEDA Take 3 tablets (1,950 mg total) by mouth daily for 7 days. Ask about: Should I take this medication?             Discharge Care Instructions  (From admission, onward)         Start     Ordered   05/04/20 0000  Change dressing       Comments: Maintain surgical dressing until follow up in the clinic. If the edges start to pull up, may reinforce with tape. If the dressing is no longer working, may remove and cover with gauze and tape, but must keep the area dry and clean.  Call with any questions or concerns.   05/04/20 1029   05/04/20 0000  Change dressing on IV access line weekly and PRN  (Home infusion instructions - [facility] )        05/04/20 1029          Follow-up Information    [facility] [place] Follow [facility].   Specialty: [place] Why: to provide home health nursing visits  Contact information: [place] [address] [phone]               Signed: Griffith Citron, PA-C Orthopedic Surgery 05/13/2020, 10:26 AM

## 2020-05-14 DIAGNOSIS — M9702XD Periprosthetic fracture around internal prosthetic left hip joint, subsequent encounter: Secondary | ICD-10-CM | POA: Diagnosis not present

## 2020-05-14 DIAGNOSIS — M96842 Postprocedural seroma of a musculoskeletal structure following a musculoskeletal system procedure: Secondary | ICD-10-CM | POA: Diagnosis not present

## 2020-05-21 DIAGNOSIS — Z96642 Presence of left artificial hip joint: Secondary | ICD-10-CM | POA: Diagnosis not present

## 2020-05-21 DIAGNOSIS — Z4789 Encounter for other orthopedic aftercare: Secondary | ICD-10-CM | POA: Diagnosis not present

## 2020-05-22 DIAGNOSIS — M9702XD Periprosthetic fracture around internal prosthetic left hip joint, subsequent encounter: Secondary | ICD-10-CM | POA: Diagnosis not present

## 2020-05-22 DIAGNOSIS — M96842 Postprocedural seroma of a musculoskeletal structure following a musculoskeletal system procedure: Secondary | ICD-10-CM | POA: Diagnosis not present

## 2020-05-23 DIAGNOSIS — Z96642 Presence of left artificial hip joint: Secondary | ICD-10-CM | POA: Diagnosis not present

## 2020-05-23 DIAGNOSIS — T8189XD Other complications of procedures, not elsewhere classified, subsequent encounter: Secondary | ICD-10-CM | POA: Diagnosis not present

## 2020-05-23 DIAGNOSIS — Z4789 Encounter for other orthopedic aftercare: Secondary | ICD-10-CM | POA: Diagnosis not present

## 2020-05-25 DIAGNOSIS — Z96642 Presence of left artificial hip joint: Secondary | ICD-10-CM | POA: Diagnosis not present

## 2020-05-25 DIAGNOSIS — Z4789 Encounter for other orthopedic aftercare: Secondary | ICD-10-CM | POA: Diagnosis not present

## 2020-05-28 DIAGNOSIS — Z792 Long term (current) use of antibiotics: Secondary | ICD-10-CM | POA: Diagnosis not present

## 2020-05-28 DIAGNOSIS — M96842 Postprocedural seroma of a musculoskeletal structure following a musculoskeletal system procedure: Secondary | ICD-10-CM | POA: Diagnosis not present

## 2020-05-28 DIAGNOSIS — Z8614 Personal history of Methicillin resistant Staphylococcus aureus infection: Secondary | ICD-10-CM | POA: Diagnosis not present

## 2020-05-28 DIAGNOSIS — Z79899 Other long term (current) drug therapy: Secondary | ICD-10-CM | POA: Diagnosis not present

## 2020-05-30 DIAGNOSIS — Z4789 Encounter for other orthopedic aftercare: Secondary | ICD-10-CM | POA: Diagnosis not present

## 2020-05-30 DIAGNOSIS — Z96642 Presence of left artificial hip joint: Secondary | ICD-10-CM | POA: Diagnosis not present

## 2020-05-31 ENCOUNTER — Other Ambulatory Visit: Payer: Self-pay

## 2020-05-31 ENCOUNTER — Inpatient Hospital Stay (HOSPITAL_COMMUNITY)
Admission: EM | Admit: 2020-05-31 | Discharge: 2020-06-12 | DRG: 314 | Disposition: A | Discharge status: Skilled Nursing Facility | Payer: Medicare Other | Attending: Internal Medicine | Admitting: Internal Medicine

## 2020-05-31 ENCOUNTER — Emergency Department (HOSPITAL_BASED_OUTPATIENT_CLINIC_OR_DEPARTMENT_OTHER): Payer: Medicare Other

## 2020-05-31 ENCOUNTER — Emergency Department (HOSPITAL_COMMUNITY): Payer: Medicare Other

## 2020-05-31 ENCOUNTER — Encounter (HOSPITAL_COMMUNITY): Payer: Self-pay | Admitting: Emergency Medicine

## 2020-05-31 DIAGNOSIS — Y838 Other surgical procedures as the cause of abnormal reaction of the patient, or of later complication, without mention of misadventure at the time of the procedure: Secondary | ICD-10-CM | POA: Diagnosis present

## 2020-05-31 DIAGNOSIS — Z96649 Presence of unspecified artificial hip joint: Secondary | ICD-10-CM

## 2020-05-31 DIAGNOSIS — R2232 Localized swelling, mass and lump, left upper limb: Secondary | ICD-10-CM | POA: Diagnosis not present

## 2020-05-31 DIAGNOSIS — Z981 Arthrodesis status: Secondary | ICD-10-CM

## 2020-05-31 DIAGNOSIS — T50905A Adverse effect of unspecified drugs, medicaments and biological substances, initial encounter: Secondary | ICD-10-CM | POA: Diagnosis not present

## 2020-05-31 DIAGNOSIS — Y831 Surgical operation with implant of artificial internal device as the cause of abnormal reaction of the patient, or of later complication, without mention of misadventure at the time of the procedure: Secondary | ICD-10-CM | POA: Diagnosis present

## 2020-05-31 DIAGNOSIS — S72112D Displaced fracture of greater trochanter of left femur, subsequent encounter for closed fracture with routine healing: Secondary | ICD-10-CM | POA: Diagnosis not present

## 2020-05-31 DIAGNOSIS — Z8616 Personal history of COVID-19: Secondary | ICD-10-CM | POA: Diagnosis not present

## 2020-05-31 DIAGNOSIS — Z8701 Personal history of pneumonia (recurrent): Secondary | ICD-10-CM | POA: Diagnosis not present

## 2020-05-31 DIAGNOSIS — Z792 Long term (current) use of antibiotics: Secondary | ICD-10-CM | POA: Diagnosis not present

## 2020-05-31 DIAGNOSIS — M7989 Other specified soft tissue disorders: Secondary | ICD-10-CM

## 2020-05-31 DIAGNOSIS — R7981 Abnormal blood-gas level: Secondary | ICD-10-CM | POA: Diagnosis not present

## 2020-05-31 DIAGNOSIS — Z6834 Body mass index (BMI) 34.0-34.9, adult: Secondary | ICD-10-CM | POA: Diagnosis not present

## 2020-05-31 DIAGNOSIS — R0602 Shortness of breath: Secondary | ICD-10-CM | POA: Diagnosis not present

## 2020-05-31 DIAGNOSIS — Z28310 Unvaccinated for covid-19: Secondary | ICD-10-CM

## 2020-05-31 DIAGNOSIS — G629 Polyneuropathy, unspecified: Secondary | ICD-10-CM | POA: Diagnosis present

## 2020-05-31 DIAGNOSIS — R339 Retention of urine, unspecified: Secondary | ICD-10-CM | POA: Diagnosis present

## 2020-05-31 DIAGNOSIS — Z8614 Personal history of Methicillin resistant Staphylococcus aureus infection: Secondary | ICD-10-CM

## 2020-05-31 DIAGNOSIS — K5730 Diverticulosis of large intestine without perforation or abscess without bleeding: Secondary | ICD-10-CM | POA: Diagnosis not present

## 2020-05-31 DIAGNOSIS — L03114 Cellulitis of left upper limb: Secondary | ICD-10-CM | POA: Diagnosis present

## 2020-05-31 DIAGNOSIS — N400 Enlarged prostate without lower urinary tract symptoms: Secondary | ICD-10-CM | POA: Diagnosis not present

## 2020-05-31 DIAGNOSIS — L0390 Cellulitis, unspecified: Secondary | ICD-10-CM | POA: Diagnosis present

## 2020-05-31 DIAGNOSIS — I700 Atherosclerosis of aorta: Secondary | ICD-10-CM | POA: Diagnosis not present

## 2020-05-31 DIAGNOSIS — Z79891 Long term (current) use of opiate analgesic: Secondary | ICD-10-CM

## 2020-05-31 DIAGNOSIS — T80218A Other infection due to central venous catheter, initial encounter: Secondary | ICD-10-CM | POA: Diagnosis not present

## 2020-05-31 DIAGNOSIS — R059 Cough, unspecified: Secondary | ICD-10-CM | POA: Diagnosis not present

## 2020-05-31 DIAGNOSIS — Z79899 Other long term (current) drug therapy: Secondary | ICD-10-CM

## 2020-05-31 DIAGNOSIS — T380X5A Adverse effect of glucocorticoids and synthetic analogues, initial encounter: Secondary | ICD-10-CM | POA: Diagnosis present

## 2020-05-31 DIAGNOSIS — D631 Anemia in chronic kidney disease: Secondary | ICD-10-CM | POA: Diagnosis present

## 2020-05-31 DIAGNOSIS — J8289 Other pulmonary eosinophilia, not elsewhere classified: Secondary | ICD-10-CM

## 2020-05-31 DIAGNOSIS — N401 Enlarged prostate with lower urinary tract symptoms: Secondary | ICD-10-CM | POA: Diagnosis not present

## 2020-05-31 DIAGNOSIS — T8452XA Infection and inflammatory reaction due to internal left hip prosthesis, initial encounter: Secondary | ICD-10-CM | POA: Diagnosis not present

## 2020-05-31 DIAGNOSIS — E669 Obesity, unspecified: Secondary | ICD-10-CM | POA: Diagnosis not present

## 2020-05-31 DIAGNOSIS — Z974 Presence of external hearing-aid: Secondary | ICD-10-CM | POA: Diagnosis not present

## 2020-05-31 DIAGNOSIS — J8282 Acute eosinophilic pneumonia: Secondary | ICD-10-CM | POA: Diagnosis not present

## 2020-05-31 DIAGNOSIS — Z888 Allergy status to other drugs, medicaments and biological substances status: Secondary | ICD-10-CM

## 2020-05-31 DIAGNOSIS — Y848 Other medical procedures as the cause of abnormal reaction of the patient, or of later complication, without mention of misadventure at the time of the procedure: Secondary | ICD-10-CM | POA: Diagnosis present

## 2020-05-31 DIAGNOSIS — R0902 Hypoxemia: Secondary | ICD-10-CM | POA: Diagnosis not present

## 2020-05-31 DIAGNOSIS — R6520 Severe sepsis without septic shock: Secondary | ICD-10-CM | POA: Diagnosis present

## 2020-05-31 DIAGNOSIS — L7634 Postprocedural seroma of skin and subcutaneous tissue following other procedure: Secondary | ICD-10-CM | POA: Diagnosis not present

## 2020-05-31 DIAGNOSIS — M6281 Muscle weakness (generalized): Secondary | ICD-10-CM | POA: Diagnosis not present

## 2020-05-31 DIAGNOSIS — J8281 Chronic eosinophilic pneumonia: Secondary | ICD-10-CM | POA: Diagnosis present

## 2020-05-31 DIAGNOSIS — R69 Illness, unspecified: Secondary | ICD-10-CM | POA: Diagnosis not present

## 2020-05-31 DIAGNOSIS — J189 Pneumonia, unspecified organism: Secondary | ICD-10-CM

## 2020-05-31 DIAGNOSIS — T8459XD Infection and inflammatory reaction due to other internal joint prosthesis, subsequent encounter: Secondary | ICD-10-CM | POA: Diagnosis not present

## 2020-05-31 DIAGNOSIS — Z7982 Long term (current) use of aspirin: Secondary | ICD-10-CM | POA: Diagnosis not present

## 2020-05-31 DIAGNOSIS — J9601 Acute respiratory failure with hypoxia: Secondary | ICD-10-CM | POA: Diagnosis not present

## 2020-05-31 DIAGNOSIS — R279 Unspecified lack of coordination: Secondary | ICD-10-CM | POA: Diagnosis not present

## 2020-05-31 DIAGNOSIS — M9702XD Periprosthetic fracture around internal prosthetic left hip joint, subsequent encounter: Secondary | ICD-10-CM | POA: Diagnosis not present

## 2020-05-31 DIAGNOSIS — Z6837 Body mass index (BMI) 37.0-37.9, adult: Secondary | ICD-10-CM

## 2020-05-31 DIAGNOSIS — M96842 Postprocedural seroma of a musculoskeletal structure following a musculoskeletal system procedure: Secondary | ICD-10-CM | POA: Diagnosis not present

## 2020-05-31 DIAGNOSIS — J984 Other disorders of lung: Secondary | ICD-10-CM | POA: Diagnosis not present

## 2020-05-31 DIAGNOSIS — Z0184 Encounter for antibody response examination: Secondary | ICD-10-CM

## 2020-05-31 DIAGNOSIS — R262 Difficulty in walking, not elsewhere classified: Secondary | ICD-10-CM | POA: Diagnosis not present

## 2020-05-31 DIAGNOSIS — Y95 Nosocomial condition: Secondary | ICD-10-CM | POA: Diagnosis present

## 2020-05-31 DIAGNOSIS — A419 Sepsis, unspecified organism: Secondary | ICD-10-CM | POA: Diagnosis not present

## 2020-05-31 DIAGNOSIS — Z20822 Contact with and (suspected) exposure to covid-19: Secondary | ICD-10-CM | POA: Diagnosis present

## 2020-05-31 DIAGNOSIS — Z96642 Presence of left artificial hip joint: Secondary | ICD-10-CM | POA: Diagnosis not present

## 2020-05-31 DIAGNOSIS — R0603 Acute respiratory distress: Secondary | ICD-10-CM | POA: Diagnosis not present

## 2020-05-31 DIAGNOSIS — Z87442 Personal history of urinary calculi: Secondary | ICD-10-CM | POA: Diagnosis not present

## 2020-05-31 DIAGNOSIS — R739 Hyperglycemia, unspecified: Secondary | ICD-10-CM | POA: Diagnosis present

## 2020-05-31 DIAGNOSIS — J9600 Acute respiratory failure, unspecified whether with hypoxia or hypercapnia: Secondary | ICD-10-CM

## 2020-05-31 DIAGNOSIS — N189 Chronic kidney disease, unspecified: Secondary | ICD-10-CM | POA: Diagnosis not present

## 2020-05-31 DIAGNOSIS — Z86010 Personal history of colonic polyps: Secondary | ICD-10-CM | POA: Diagnosis not present

## 2020-05-31 DIAGNOSIS — T368X5A Adverse effect of other systemic antibiotics, initial encounter: Secondary | ICD-10-CM | POA: Diagnosis present

## 2020-05-31 DIAGNOSIS — Z743 Need for continuous supervision: Secondary | ICD-10-CM | POA: Diagnosis not present

## 2020-05-31 DIAGNOSIS — I808 Phlebitis and thrombophlebitis of other sites: Secondary | ICD-10-CM | POA: Diagnosis not present

## 2020-05-31 DIAGNOSIS — T8452XD Infection and inflammatory reaction due to internal left hip prosthesis, subsequent encounter: Secondary | ICD-10-CM | POA: Diagnosis not present

## 2020-05-31 DIAGNOSIS — N1832 Chronic kidney disease, stage 3b: Secondary | ICD-10-CM | POA: Diagnosis not present

## 2020-05-31 DIAGNOSIS — R600 Localized edema: Secondary | ICD-10-CM | POA: Diagnosis not present

## 2020-05-31 DIAGNOSIS — I509 Heart failure, unspecified: Secondary | ICD-10-CM | POA: Diagnosis not present

## 2020-05-31 DIAGNOSIS — R338 Other retention of urine: Secondary | ICD-10-CM | POA: Diagnosis not present

## 2020-05-31 DIAGNOSIS — J90 Pleural effusion, not elsewhere classified: Secondary | ICD-10-CM | POA: Diagnosis not present

## 2020-05-31 DIAGNOSIS — Z713 Dietary counseling and surveillance: Secondary | ICD-10-CM

## 2020-05-31 DIAGNOSIS — T8459XA Infection and inflammatory reaction due to other internal joint prosthesis, initial encounter: Secondary | ICD-10-CM | POA: Diagnosis not present

## 2020-05-31 DIAGNOSIS — T80219A Unspecified infection due to central venous catheter, initial encounter: Secondary | ICD-10-CM

## 2020-05-31 DIAGNOSIS — T80212A Local infection due to central venous catheter, initial encounter: Secondary | ICD-10-CM | POA: Diagnosis not present

## 2020-05-31 DIAGNOSIS — J9690 Respiratory failure, unspecified, unspecified whether with hypoxia or hypercapnia: Secondary | ICD-10-CM | POA: Diagnosis not present

## 2020-05-31 DIAGNOSIS — M5134 Other intervertebral disc degeneration, thoracic region: Secondary | ICD-10-CM | POA: Diagnosis not present

## 2020-05-31 DIAGNOSIS — R609 Edema, unspecified: Secondary | ICD-10-CM

## 2020-05-31 DIAGNOSIS — Z9181 History of falling: Secondary | ICD-10-CM | POA: Diagnosis not present

## 2020-05-31 DIAGNOSIS — I2510 Atherosclerotic heart disease of native coronary artery without angina pectoris: Secondary | ICD-10-CM | POA: Diagnosis not present

## 2020-05-31 DIAGNOSIS — N281 Cyst of kidney, acquired: Secondary | ICD-10-CM | POA: Diagnosis not present

## 2020-05-31 DIAGNOSIS — G4700 Insomnia, unspecified: Secondary | ICD-10-CM | POA: Diagnosis not present

## 2020-05-31 DIAGNOSIS — R41841 Cognitive communication deficit: Secondary | ICD-10-CM | POA: Diagnosis not present

## 2020-05-31 DIAGNOSIS — Z452 Encounter for adjustment and management of vascular access device: Secondary | ICD-10-CM | POA: Diagnosis not present

## 2020-05-31 DIAGNOSIS — R531 Weakness: Secondary | ICD-10-CM | POA: Diagnosis not present

## 2020-05-31 DIAGNOSIS — I517 Cardiomegaly: Secondary | ICD-10-CM | POA: Diagnosis not present

## 2020-05-31 DIAGNOSIS — Z741 Need for assistance with personal care: Secondary | ICD-10-CM | POA: Diagnosis not present

## 2020-05-31 LAB — PROTIME-INR
INR: 1.2 (ref 0.8–1.2)
Prothrombin Time: 14.8 seconds (ref 11.4–15.2)

## 2020-05-31 LAB — HEPATIC FUNCTION PANEL
ALT: 20 U/L (ref 0–44)
AST: 26 U/L (ref 15–41)
Albumin: 3.1 g/dL — ABNORMAL LOW (ref 3.5–5.0)
Alkaline Phosphatase: 126 U/L (ref 38–126)
Bilirubin, Direct: 0.1 mg/dL (ref 0.0–0.2)
Indirect Bilirubin: 0.4 mg/dL (ref 0.3–0.9)
Total Bilirubin: 0.5 mg/dL (ref 0.3–1.2)
Total Protein: 7 g/dL (ref 6.5–8.1)

## 2020-05-31 LAB — CBC WITH DIFFERENTIAL/PLATELET
Abs Immature Granulocytes: 0.15 10*3/uL — ABNORMAL HIGH (ref 0.00–0.07)
Basophils Absolute: 0.1 10*3/uL (ref 0.0–0.1)
Basophils Relative: 0 %
Eosinophils Absolute: 0.3 10*3/uL (ref 0.0–0.5)
Eosinophils Relative: 2 %
HCT: 30.2 % — ABNORMAL LOW (ref 39.0–52.0)
Hemoglobin: 9 g/dL — ABNORMAL LOW (ref 13.0–17.0)
Immature Granulocytes: 1 %
Lymphocytes Relative: 7 %
Lymphs Abs: 1 10*3/uL (ref 0.7–4.0)
MCH: 24.5 pg — ABNORMAL LOW (ref 26.0–34.0)
MCHC: 29.8 g/dL — ABNORMAL LOW (ref 30.0–36.0)
MCV: 82.3 fL (ref 80.0–100.0)
Monocytes Absolute: 0.8 10*3/uL (ref 0.1–1.0)
Monocytes Relative: 6 %
Neutro Abs: 10.9 10*3/uL — ABNORMAL HIGH (ref 1.7–7.7)
Neutrophils Relative %: 84 %
Platelets: 387 10*3/uL (ref 150–400)
RBC: 3.67 MIL/uL — ABNORMAL LOW (ref 4.22–5.81)
RDW: 17.7 % — ABNORMAL HIGH (ref 11.5–15.5)
WBC: 13.1 10*3/uL — ABNORMAL HIGH (ref 4.0–10.5)
nRBC: 0 % (ref 0.0–0.2)

## 2020-05-31 LAB — TROPONIN I (HIGH SENSITIVITY)
Troponin I (High Sensitivity): 12 ng/L (ref <18)
Troponin I (High Sensitivity): 11 ng/L (ref <18)

## 2020-05-31 LAB — URINALYSIS, ROUTINE W REFLEX MICROSCOPIC
Bacteria, UA: NONE SEEN
Bilirubin Urine: NEGATIVE
Glucose, UA: NEGATIVE mg/dL
Hgb urine dipstick: NEGATIVE
Ketones, ur: NEGATIVE mg/dL
Leukocytes,Ua: NEGATIVE
Nitrite: NEGATIVE
Protein, ur: 30 mg/dL — AB
Specific Gravity, Urine: 1.027 (ref 1.005–1.030)
pH: 5 (ref 5.0–8.0)

## 2020-05-31 LAB — LACTIC ACID, PLASMA
Lactic Acid, Venous: 2 mmol/L (ref 0.5–1.9)
Lactic Acid, Venous: 1.5 mmol/L (ref 0.5–1.9)

## 2020-05-31 LAB — BASIC METABOLIC PANEL
Anion gap: 12 (ref 5–15)
BUN: 27 mg/dL — ABNORMAL HIGH (ref 8–23)
CO2: 24 mmol/L (ref 22–32)
Calcium: 8.3 mg/dL — ABNORMAL LOW (ref 8.9–10.3)
Chloride: 107 mmol/L (ref 98–111)
Creatinine, Ser: 1.46 mg/dL — ABNORMAL HIGH (ref 0.61–1.24)
GFR, Estimated: 50 mL/min — ABNORMAL LOW (ref >60)
Glucose, Bld: 110 mg/dL — ABNORMAL HIGH (ref 70–99)
Potassium: 4 mmol/L (ref 3.5–5.1)
Sodium: 143 mmol/L (ref 135–145)

## 2020-05-31 LAB — POC SARS CORONAVIRUS 2 AG -  ED: SARSCOV2ONAVIRUS 2 AG: NEGATIVE

## 2020-05-31 LAB — LIPASE, BLOOD: Lipase: 23 U/L (ref 11–51)

## 2020-05-31 LAB — BRAIN NATRIURETIC PEPTIDE: B Natriuretic Peptide: 208.7 pg/mL — ABNORMAL HIGH (ref 0.0–100.0)

## 2020-05-31 MED ORDER — LACTATED RINGERS IV SOLN: INTRAVENOUS | Status: DC

## 2020-05-31 MED ORDER — IPRATROPIUM-ALBUTEROL 0.5-2.5 (3) MG/3ML IN SOLN
3.0000 mL | Freq: Once | RESPIRATORY_TRACT | Status: AC
  Administered 2020-05-31: 3 mL via RESPIRATORY_TRACT
  Filled 2020-05-31: qty 3

## 2020-05-31 MED ORDER — VANCOMYCIN HCL 2000 MG/400ML IV SOLN
2000.0000 mg | Freq: Once | INTRAVENOUS | Status: AC
  Administered 2020-05-31: 2000 mg via INTRAVENOUS
  Filled 2020-05-31: qty 400

## 2020-05-31 MED ORDER — HYDROCODONE-HOMATROPINE 5-1.5 MG/5ML PO SYRP
5.0000 mL | ORAL_SOLUTION | ORAL | Status: DC | PRN
  Administered 2020-05-31 – 2020-06-07 (×15): 5 mL via ORAL
  Filled 2020-05-31 (×16): qty 5

## 2020-05-31 MED ORDER — SODIUM CHLORIDE 0.9 % IV SOLN
2.0000 g | Freq: Once | INTRAVENOUS | Status: AC
  Administered 2020-05-31: 2 g via INTRAVENOUS
  Filled 2020-05-31: qty 2

## 2020-05-31 MED ORDER — PANTOPRAZOLE SODIUM 40 MG IV SOLR
40.0000 mg | Freq: Once | INTRAVENOUS | Status: AC
  Administered 2020-05-31: 40 mg via INTRAVENOUS
  Filled 2020-05-31: qty 40

## 2020-05-31 MED ORDER — VANCOMYCIN HCL IN DEXTROSE 1-5 GM/200ML-% IV SOLN: 1000.0000 mg | Freq: Once | INTRAVENOUS | Status: DC

## 2020-05-31 MED ORDER — IOHEXOL 350 MG/ML SOLN
100.0000 mL | Freq: Once | INTRAVENOUS | Status: AC | PRN
  Administered 2020-05-31: 100 mL via INTRAVENOUS

## 2020-05-31 MED ORDER — IPRATROPIUM-ALBUTEROL 0.5-2.5 (3) MG/3ML IN SOLN
3.0000 mL | RESPIRATORY_TRACT | Status: DC | PRN
  Administered 2020-05-31 – 2020-06-03 (×5): 3 mL via RESPIRATORY_TRACT
  Filled 2020-05-31 (×5): qty 3

## 2020-05-31 MED ORDER — LACTATED RINGERS IV BOLUS (SEPSIS)
1000.0000 mL | Freq: Once | INTRAVENOUS | Status: AC
  Administered 2020-05-31: 1000 mL via INTRAVENOUS

## 2020-05-31 NOTE — ED Triage Notes (Signed)
Patient complains of L arm swelling since 3:00 pm today, has a PICC line for abx and heparin last infused around noon today, also complains of a cough x2-3 days.

## 2020-05-31 NOTE — Progress Notes (Signed)
Pharmacy Antibiotic Note  Jacob Mitchell is a 75 y.o. male admitted on 05/31/2020 with PICC line in place for IV abx (CTX and daptomycin) for left hip infection. Pharmacy has been consulted to dose vancomycin and cefepime for pna  Plan: Vancomycin 2gm IV x 1500 mg q24h Cefepime 2gm IV q8h Follow renal function, cultures and clinical course  Height: 6\' 7"  (200.7 cm) Weight: 136.1 kg (300 lb) IBW/kg (Calculated) : 93.7  Temp (24hrs), Avg:98.5 F (36.9 C), Min:98.5 F (36.9 C), Max:98.5 F (36.9 C)  Recent Labs  Lab 05/31/20 1742 05/31/20 2026  WBC 13.1*  --   CREATININE 1.46*  --   LATICACIDVEN  --  2.0*    Estimated Creatinine Clearance: 67.4 mL/min (A) (by C-G formula based on SCr of 1.46 mg/dL (H)).    Allergies  Allergen Reactions  . Aspartame And Phenylalanine Palpitations    Artificial sweetener- hrt 140  went to ED    Antimicrobials this admission: 4/22 vanc >>  4/22 cefepime>>   Dose adjustments this admission:   Microbiology results: 4/21 BCx:    Thank you for allowing pharmacy to be a part of this patient's care.  Dolly Rias RPh 05/31/2020, 10:46 PM

## 2020-05-31 NOTE — ED Provider Notes (Signed)
MSE was initiated and I personally evaluated the patient and placed orders (if any) at  5:47 PM on May 31, 2020.  The patient appears stable so that the remainder of the MSE may be completed by another provider.  Patient placed in Quick Look pathway, seen and evaluated   Chief Complaint: Arm swelling   HPI:   75 y.o. M with PICC line in place for IV abx for his hip presents for evaluation of LLE swelling that began at 3pm this afternoon. He had an IV abx dose at 12pm and then noticed afterwards it started swelling. No pain. No fevers. No numbness/weakness.   ROS: arm swelling   Physical Exam:   Gen: No distress  Neuro: Awake and Alert  Skin: Warm    Focused Exam: LLE is edematous and warm, no erythema. 2+ radial pulse.    Initiation of care has begun. The patient has been counseled on the process, plan, and necessity for staying for the completion/evaluation, and the remainder of the medical screening examination    Jacob Mitchell 05/31/20 1748    Lucrezia Starch, MD 05/31/20 [phone]

## 2020-05-31 NOTE — Sepsis Progress Note (Signed)
Monitoring for code sepsis protocol. 

## 2020-05-31 NOTE — H&P (Signed)
History and Physical   Jacob Mitchell QPY:[id number] DOB: 1945-03-15 DOA: 05/31/2020  Referring MD/NP/PA: Dr. Johnney Killian  PCP: [name], NP   Outpatient Specialists: Dr. Paralee Cancel, orthopedics  Patient coming from: Home  Chief Complaint: Left arm swelling and cough  HPI: Jacob Mitchell is a 75 y.o. male with medical history significant of status post multiple orthopedic surgeries including total hip replacement total knee replacement with MRSA infection currently on daptomycin and Rocephin, history of previous left hip replacement that developed complications with development of seroma that has been drained several times which is why he has been on IV antibiotics, BPH, kidney stones, chronic kidney disease stage III, who presents today with swelling and pain around the left sided PICC line.  Denied any significant fever but has had mild chills.  Also patient has had shortness of breath and some cough.  This has been going on this week.  No hemoptysis.  No other significant complaint.  He was brought in by his wife who is the primary history giver.  Initial evaluation including Doppler ultrasound of the left upper extremity showed no DVT.  Suspected cellulitis around the PICC line.  Patient also has evidence of pneumonia.  This is suspected to be healthcare associated pneumonia therefore.  At this point no fever or chills.  ER is consulting orthopedics as well as infectious disease for any guidance.  But patient meets sepsis criteria with a white count as well as respiratory rate..  ED Course: Temperature is 98.5, blood pressure 141/115, pulse 100 respiratory 24 oxygen sat 91% on room air.  White count is 13.1 hemoglobin 9.1 platelet count of 387.  Chemistry showed BUN 27 creatinine 1.46 calcium 8.3.  Glucose 110 and lactic acid 2.0.  Urinalysis essentially negative.  Respiratory panel is currently pending.  CT angiogram of the chest showed no PE.  There is mild cardiomegaly and small  bilateral pleural effusions.  There is bilateral patchy groundglass opacity consistent with multilobar pneumonia.  Patient being admitted to the hospital with sepsis due to healthcare associated pneumonia and cellulitis.  Review of Systems: As per HPI otherwise 10 point review of systems negative.    Past Medical History:  Diagnosis Date  . Benign localized prostatic hyperplasia with lower urinary tract symptoms (LUTS)   . Complication of anesthesia    pt says he gets "restless" when he is waking up and nurses have had to "hold him down"- happened several surgeries ago   . Diverticulosis of colon   . History of colon polyps   . History of kidney stones   . History of MRSA infection 2007   post right shoulder surgery  . History of urinary retention 05/31/2017   due to BPH  . Incomplete emptying of bladder   . OA (osteoarthritis)   . Peripheral neuropathy    feet-- hx frost bite  . Pinched nerve in neck    right index and middle fingers numbness  . Wears glasses   . Wears hearing aid in both ears     Past Surgical History:  Procedure Laterality Date  . CATARACT EXTRACTION Left 01/13/2020  . COLONOSCOPY    . CYST REMOVAL LEG Right 1975  . CYSTOSCOPY WITH INSERTION OF UROLIFT N/A 07/20/2017   Procedure: CYSTOSCOPY WITH INSERTION OF UROLIFT;  Surgeon: Cleon Gustin, MD;  Location: [facility];  Service: Urology;  Laterality: N/A;  . CYSTOSCOPY WITH INSERTION OF UROLIFT    . DISTAL CLAVICLE EXCISION Left  1977   left shoulder  . ELBOW SURGERY Right 1969;  1980  . HAND SURGERY Right 1968  . INCISION AND DRAINAGE HIP Left 05/01/2020   Procedure: IRRIGATION AND DEBRIDEMENT LEFT HIP SEROMA;  Surgeon: Paralee Cancel, MD;  Location: [facility];  Service: Orthopedics;  Laterality: Left;  90 mins  . LAMINECTOMY  1970   L4-5  . LUMBAR FUSION  2013  approx.   L1--L5  . ORIF HIP FRACTURE Left 01/17/2020   Procedure: OPEN REDUCTION INTERNAL FIXATION LEFT HIP GREATER TROCHANTER  FRACTURE;  Surgeon: Paralee Cancel, MD;  Location: [facility];  Service: Orthopedics;  Laterality: Left;  90 MINS  . ORIF PERIPROSTHETIC FRACTURE Left 02/27/2020   Procedure: OPEN REDUCTION INTERNAL FIXATION (ORIF) LEFT PERI-PROSTETIC  HIP FRACTURE;  Surgeon: Paralee Cancel, MD;  Location: [facility];  Service: Orthopedics;  Laterality: Left;  50mns  . ROTATOR CUFF REPAIR Right x5    last one 2007   5 surgeries in 9 days--- original repair then 4 sx'  I&D sx's for MRSA infection  . SHOULDER ARTHROSCOPY WITH DISTAL CLAVICLE RESECTION Left 1977  . SHOULDER SURGERY Left 1976  . TOTAL HIP ARTHROPLASTY Left 11/23/2015   Procedure: LEFT TOTAL HIP ARTHROPLASTY ANTERIOR APPROACH;  Surgeon: MParalee Cancel MD;  Location: [facility];  Service: Orthopedics;  Laterality: Left;  . TOTAL HIP REVISION     10/14/16 Dr. OAlvan Dame . TOTAL HIP REVISION Left 10/14/2016   Procedure: TOTAL HIP REVISION POSTERIOR APPROACH;  Surgeon: OParalee Cancel MD;  Location: [facility];  Service: Orthopedics;  Laterality: Left; (total of 4 hip surgeries in all)     reports that he has never smoked. He has never used smokeless tobacco. He reports that he does not drink alcohol and does not use drugs.  Allergies  Allergen Reactions  . Aspartame And Phenylalanine Palpitations    Artificial sweetener- hrt 140  went to ED    Family History  Problem Relation Age of Onset  . Alcohol abuse Father   . Drug abuse Brother      Prior to Admission medications   Medication Sig Start Date End Date Taking? Authorizing Provider  tranexamic acid (LYSTEDA) 650 MG TABS tablet Take 650 mg by mouth.   Yes [provider]  Ascorbic Acid (VITAMIN C) 1000 MG tablet Take 1,000 mg by mouth daily. Patient not taking: Reported on 04/17/2020    [provider]  aspirin 81 MG chewable tablet CHEW 1 TABLET BY MOUTH 2 TIMES DAILY FOR 28 DAYS 02/29/20 02/28/21  SMaurice March PA-C  cefTRIAXone (ROCEPHIN) IVPB Inject 2 g into the vein daily. Indication:   Presumed hip infection First Dose: Yes Last Day of Therapy:  06/14/2020 Labs - Once weekly:  CBC/D and BMP, Labs - Every other week:  ESR and CRP Method of administration: IV Push Method of administration may be changed at the discretion of home infusion pharmacist based upon assessment of the patient and/or caregiver's ability to self-administer the medication ordered. 05/04/20 06/14/20  SMaurice March PA-C  celecoxib (CELEBREX) 200 MG capsule TAKE 1 CAPSULE BY MOUTH 2 TIMES DAILY 02/29/20 02/28/21  SMaurice March PA-C  Cholecalciferol (VITAMIN D3 PO) Take 1 tablet by mouth daily. Patient not taking: Reported on 04/17/2020    [provider]  daptomycin (CUBICIN) IVPB Inject 900 mg into the vein daily. Indication:  Presumed hip infection First Dose: Yes Last Day of Therapy:  06/14/2020 Labs - Once weekly:  CBC/D, BMP, and CPK Labs - Every  other week:  ESR and CRP Method of administration: IV Push Method of administration may be changed at the discretion of home infusion pharmacist based upon assessment of the patient and/or caregiver's ability to self-administer the medication ordered. 05/04/20 06/14/20  Maurice March, PA-C  docusate sodium (COLACE) 100 MG capsule Take 1 capsule (100 mg total) by mouth 2 (two) times daily. Patient not taking: Reported on 04/17/2020 02/29/20   Maurice March, PA-C  ferrous sulfate 325 (65 FE) MG tablet TAKE 1 TABLET BY MOUTH 3 TIMES DAILY AFTER MEALS FOR 14 DAYS Patient not taking: Reported on 04/17/2020 02/29/20 02/28/21  Maurice March, PA-C  fluticasone [facility]) 50 MCG/ACT nasal spray Place 2 sprays into both nostrils daily. 01/11/20   Nche, Charlene Brooke, NP  HYDROcodone-acetaminophen (NORCO) 7.5-325 MG tablet TAKE 1 TO 2 TABLETS BY MOUTH EVERY 6 HOURS AS NEEDED FOR SEVERE PAIN (PAIN SCORE 7-10) Patient not taking: Reported on 04/17/2020 02/29/20 08/27/20  Maurice March, PA-C  methocarbamol (ROBAXIN) 500 MG tablet TAKE 1 TABLET BY MOUTH EVERY 6  HOURS AS NEEDED FOR MUSCLE SPASMS Patient not taking: Reported on 04/17/2020 02/29/20 02/28/21  Maurice March, PA-C  Multiple Vitamins-Minerals (ZINC PO) Take 1 tablet by mouth daily. Patient not taking: Reported on 04/17/2020    [provider]  polyethylene glycol (MIRALAX / GLYCOLAX) 17 g packet Take 17 g by mouth daily as needed for mild constipation. 05/04/20   Maurice March, PA-C  polyethylene glycol powder (GLYCOLAX/MIRALAX) 17 GM/SCOOP powder TAKE 17 G BY MOUTH 2 TIMES DAILY. 02/29/20 02/28/21  Maurice March, PA-C  prednisoLONE acetate (PRED FORTE) 1 % ophthalmic suspension Place 1 drop into the right eye 3 (three) times daily. Patient not taking: Reported on 04/17/2020    [provider]  traZODone (DESYREL) 100 MG tablet Take 1 tablet (100 mg total) by mouth at bedtime as needed. 03/23/20   [name], NP    Physical Exam: Vitals:   05/31/20 2200 05/31/20 2205 05/31/20 2208 05/31/20 2215  BP: 124/63  (!) 119/98 (!) 126/105  Pulse: 86  100 86  Resp:  _0 Temp:      TempSrc:      SpO2:  91% 95% 97%  Weight:      Height:          Constitutional: NAD, calm, comfortable Vitals:   05/31/20 2200 05/31/20 2205 05/31/20 2208 05/31/20 2215  BP: 124/63  (!) 119/98 (!) 126/105  Pulse: 86  100 86  Resp:  _1 Temp:      TempSrc:      SpO2:  91% 95% 97%  Weight:      Height:       Eyes: PERRL, lids and conjunctivae normal ENMT: Mucous membranes are moist. Posterior pharynx clear of any exudate or lesions.Normal dentition.  Neck: normal, supple, no masses, no thyromegaly Respiratory: Coarse breath sound bilaterally no wheeze but some rhonchi. Normal respiratory effort. No accessory muscle use.  Cardiovascular: Regular rate and rhythm, no murmurs / rubs / gallops. No extremity edema. 2+ pedal pulses. No carotid bruits.  Abdomen: no tenderness, no masses palpated. No hepatosplenomegaly. Bowel sounds positive.  Musculoskeletal: no clubbing /  cyanosis. No joint deformity upper and lower extremities. Good ROM, no contractures. Normal muscle tone.  Markedly edematous left upper extremity, with about 2+ edema around the PICC line, erythema around the side with no purulent discharge.  Also a large soft mass on the lateral  left hip with visible cyst healed scar with no erythema no tenderness Skin: no rashes, lesions, ulcers. No induration Neurologic: CN 2-12 grossly intact. Sensation intact, DTR normal. Strength 5/5 in all 4.  Psychiatric: Normal judgment and insight. Alert and oriented x 3. Normal mood.     Labs on Admission: I have personally reviewed following labs and imaging studies  CBC: Recent Labs  Lab 05/31/20 1742  WBC 13.1*  NEUTROABS 10.9*  HGB 9.0*  HCT 30.2*  MCV 82.3  PLT 482   Basic Metabolic Panel: Recent Labs  Lab 05/31/20 1742  NA 143  K 4.0  CL 107  CO2 24  GLUCOSE 110*  BUN 27*  CREATININE 1.46*  CALCIUM 8.3*   GFR: Estimated Creatinine Clearance: 67.4 mL/min (A) (by C-G formula based on SCr of 1.46 mg/dL (H)). Liver Function Tests: Recent Labs  Lab 05/31/20 2026  AST 26  ALT 20  ALKPHOS 126  BILITOT 0.5  PROT 7.0  ALBUMIN 3.1*   Recent Labs  Lab 05/31/20 2026  LIPASE 23   No results for input(s): AMMONIA in the last 168 hours. Coagulation Profile: Recent Labs  Lab 05/31/20 2026  INR 1.2   Cardiac Enzymes: No results for input(s): CKTOTAL, CKMB, CKMBINDEX, TROPONINI in the last 168 hours. BNP (last 3 results) No results for input(s): PROBNP in the last 8760 hours. HbA1C: No results for input(s): HGBA1C in the last 72 hours. CBG: No results for input(s): GLUCAP in the last 168 hours. Lipid Profile: No results for input(s): CHOL, HDL, LDLCALC, TRIG, CHOLHDL, LDLDIRECT in the last 72 hours. Thyroid Function Tests: No results for input(s): TSH, T4TOTAL, FREET4, T3FREE, THYROIDAB in the last 72 hours. Anemia Panel: No results for input(s): VITAMINB12, FOLATE, FERRITIN,  TIBC, IRON, RETICCTPCT in the last 72 hours. Urine analysis:    Component Value Date/Time   COLORURINE YELLOW 05/31/2020 2030   APPEARANCEUR CLEAR 05/31/2020 2030   LABSPEC 1.027 05/31/2020 2030   [place] 5.0 05/31/2020 2030   GLUCOSEU NEGATIVE 05/31/2020 2030   [place] NEGATIVE 05/31/2020 2030   [place] NEGATIVE 05/31/2020 2030   [place] 05/31/2020 2030   PROTEINUR 30 (A) 05/31/2020 2030   NITRITE NEGATIVE 05/31/2020 2030   LEUKOCYTESUR NEGATIVE 05/31/2020 2030   Sepsis Labs: _0 (procalcitonin:4,lacticidven:4) )No results found for this or any previous visit (from the past 240 hour(s)).   Radiological Exams on Admission: CT Angio Chest PE W/Cm &/Or Wo Cm  Result Date: 05/31/2020 CLINICAL DATA:  75 year old male with left upper extremity swelling. Concern for pulmonary embolism. EXAM: CT ANGIOGRAPHY CHEST WITH CONTRAST TECHNIQUE: Multidetector CT imaging of the chest was performed using the standard protocol during bolus administration of intravenous contrast. Multiplanar CT image reconstructions and MIPs were obtained to evaluate the vascular anatomy. CONTRAST:  121m OMNIPAQUE IOHEXOL 350 MG/ML SOLN COMPARISON:  None. FINDINGS: Evaluation of this exam is limited due to respiratory motion artifact. Cardiovascular: Mild cardiomegaly. No pericardial effusion. There is coronary vascular calcification. The thoracic aorta is unremarkable. The origins of the great vessels of the aortic arch appear patent. Evaluation of the pulmonary arteries is very limited due to respiratory motion artifact and suboptimal visualization of the peripheral branches. No large central pulmonary artery embolus identified. Mediastinum/Nodes: Mildly enlarged right hilar lymph node measures 13 mm in short axis. The esophagus is grossly unremarkable. No mediastinal fluid collection. Lungs/Pleura: Small bilateral pleural effusions. There is mild diffuse interstitial and interlobular septal thickening,  likely mild edema. Bilateral patchy ground-glass opacity most concerning for multilobar pneumonia. Clinical  correlation and follow-up to resolution recommended. No pneumothorax. The central airways are patent. Upper Abdomen: Partially visualized bilateral renal cysts measure up to 7.5 cm on the left. Musculoskeletal: Degenerative changes of the spine. No acute osseous pathology. Review of the MIP images confirms the above findings. IMPRESSION: 1. No CT evidence of central pulmonary artery embolus. 2. Mild cardiomegaly and small bilateral pleural effusions may represent mild CHF. 3. Bilateral patchy ground-glass opacity most concerning for multilobar pneumonia. Clinical correlation and follow-up to resolution recommended. 4. Mildly enlarged right hilar lymph node, likely reactive. 5. Aortic Atherosclerosis (ICD10-I70.0). Electronically Signed   By: Anner Crete M.D.   On: 05/31/2020 21:17   UE VENOUS DUPLEX ([place] & [facility] 7 am - 7 pm)  Result Date: 05/31/2020 UPPER VENOUS STUDY  Indications: Swelling, and Edema Comparison Study: no prior Performing Technologist: Abram Sander RVS  Examination Guidelines: A complete evaluation includes B-mode imaging, spectral Doppler, color Doppler, and power Doppler as needed of all accessible portions of each vessel. Bilateral testing is considered an integral part of a complete examination. Limited examinations for reoccurring indications may be performed as noted.  Left Findings: +----------+------------+---------+-----------+----------+-------+ LEFT      CompressiblePhasicitySpontaneousPropertiesSummary +----------+------------+---------+-----------+----------+-------+ IJV           Full       Yes       Yes                      +----------+------------+---------+-----------+----------+-------+ Subclavian    Full       Yes       Yes                      +----------+------------+---------+-----------+----------+-------+ Axillary      Full       Yes        Yes                      +----------+------------+---------+-----------+----------+-------+ Brachial      Full       Yes       Yes                      +----------+------------+---------+-----------+----------+-------+ Radial        Full                                          +----------+------------+---------+-----------+----------+-------+ Ulnar         Full                                          +----------+------------+---------+-----------+----------+-------+ Cephalic      Full                                          +----------+------------+---------+-----------+----------+-------+ Basilic       Full                                          +----------+------------+---------+-----------+----------+-------+  Summary:  Left: No evidence of deep vein thrombosis in the upper extremity. No evidence of  superficial vein thrombosis in the upper extremity. No evidence of thrombosis in the subclavian.  *See table(s) above for measurements and observations.  Diagnosing physician: Harold Barban MD Electronically signed by Harold Barban MD on 05/31/2020 at 9:29:04 PM.    Final       Assessment/Plan Principal Problem:   Sepsis ([place]) Active Problems:   S/P left THA, AA   S/P revision of total hip   Obese   HCAP (healthcare-associated pneumonia)   Cellulitis     #1 sepsis: Most likely due to left upper extremity cellulitis and healthcare associated pneumonia.  Patient already on daptomycin as well as Rocephin.  We will get infectious disease input but switch patient to vancomycin and cefepime to get started with.  Mild renal insufficiency so pharmacy to dose.  #2 Healthcare associated pneumonia: Empiric antibiotic as per above.  #3 left upper extremity cellulitis: Around the PICC line.  We will get IDs recommendation but more than likely the PICC line may need to be pulled especially blood cultures positive.  #4 recurrent seroma of the left hip area: Following  orthopedic surgery with replacement.  Orthopedic surgery will be consulted.  #5 morbid obesity: Dietary counseling.   DVT prophylaxis: Lovenox Code Status: Full code Family Communication: Wife at bedside Disposition Plan: To be determined Consults called: Dr. Paralee Cancel, orthopedics Admission status: Inpatient  Severity of Illness: The appropriate patient status for this patient is INPATIENT. Inpatient status is judged to be reasonable and necessary in order to provide the required intensity of service to ensure the patient's safety. The patient's presenting symptoms, physical exam findings, and initial radiographic and laboratory data in the context of their chronic comorbidities is felt to place them at high risk for further clinical deterioration. Furthermore, it is not anticipated that the patient will be medically stable for discharge from the hospital within 2 midnights of admission. The following factors support the patient status of inpatient.   " The patient's presenting symptoms include left upper extremity swelling with pain. " The worrisome physical exam findings include swollen left upper arm. " The initial radiographic and laboratory data are worrisome because of multilobar pneumonia. " The chronic co-morbidities include chronic left hip infection.   * I certify that at the point of admission it is my clinical judgment that the patient will require inpatient hospital care spanning beyond 2 midnights from the point of admission due to high intensity of service, high risk for further deterioration and high frequency of surveillance required.Barbette Merino MD [facility] Pager [phone]  If 7PM-7AM, please contact night-coverage [email or web address] Password [facility]  05/31/2020, 10:44 PM

## 2020-05-31 NOTE — Progress Notes (Signed)
Upper extremity venous has been completed.   Preliminary results in CV Proc.   Jacob Mitchell 05/31/2020 6:18 PM

## 2020-05-31 NOTE — ED Notes (Signed)
Family at bedside. 

## 2020-05-31 NOTE — ED Provider Notes (Addendum)
[facility] DEPT Provider Note   CSN: [id number] Arrival date & time: 05/31/20  1729     History No chief complaint on file.   Jacob Mitchell is a 75 y.o. male.  HPI Patient has a PICC line in his left upper arm.  He reports the arm got really swollen over the past 2 days.  He reports it was somewhat more swollen this morning.  He reports the hand was really caught and he could not wrinkle the skin over his hand at all.  He reports is a little less right now but the arm remains very swollen.  It PICC line nurse thought that the line was clogged.  It was hard to push medications earlier today.  They were also concerned for the development of a blood clot and patient was instructed to come to the emergency department.  He reports the arm is uncomfortable but not numb or weak.  In addition to this symptom, he reports has had a really harsh cough for a week.  He reports has been very dry and persistent.  His wife notes that he is appeared short of breath this week.  He has not had any documented fever.  Denies chest pain.  Denies abdominal pain nausea or vomiting.  Patient has had problems with healing a left hip surgery.  It required several revisions and had the interim trochanter fracture.  Developed a large seroma and has had to have it drained several times.  Patient reports he has history of MRSA and thus was put on antibiotics when there was difficulty healing the hip.  After his most recent drainage of the lateral seroma, ID recommended daptomycin and ceftriaxone by PICC line.    Past Medical History:  Diagnosis Date  . Benign localized prostatic hyperplasia with lower urinary tract symptoms (LUTS)   . Complication of anesthesia    pt says he gets "restless" when he is waking up and nurses have had to "hold him down"- happened several surgeries ago   . Diverticulosis of colon   . History of colon polyps   . History of kidney stones   . History of MRSA  infection 2007   post right shoulder surgery  . History of urinary retention 05/31/2017   due to BPH  . Incomplete emptying of bladder   . OA (osteoarthritis)   . Peripheral neuropathy    feet-- hx frost bite  . Pinched nerve in neck    right index and middle fingers numbness  . Wears glasses   . Wears hearing aid in both ears     Patient Active Problem List   Diagnosis Date Noted  . 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 Greater trochanter fracture ([place]) 01/17/2020  . Cough 05/14/2018  . Insomnia 10/21/2017  . Obese 10/15/2016  . 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 History of nephrolithiasis 07/24/2016  . Neuropathy 07/24/2016  . S/P left THA, AA 11/23/2015    Past Surgical History:  Procedure Laterality Date  . CATARACT EXTRACTION Left 01/13/2020  . COLONOSCOPY    . CYST REMOVAL LEG Right 1975  . CYSTOSCOPY WITH INSERTION OF UROLIFT N/A 07/20/2017   Procedure: CYSTOSCOPY WITH INSERTION OF UROLIFT;  Surgeon: Cleon Gustin, MD;  Location: [facility];  Service: Urology;  Laterality: N/A;  . CYSTOSCOPY WITH INSERTION OF UROLIFT    . DISTAL CLAVICLE EXCISION Left 1977   left shoulder  . ELBOW SURGERY Right 1969;  [facility]  . INCISION AND DRAINAGE HIP Left 05/01/2020   Procedure: IRRIGATION AND DEBRIDEMENT LEFT HIP SEROMA;  Surgeon: Paralee Cancel, MD;  Location: [facility];  Service: Orthopedics;  Laterality: Left;  90 mins  . LAMINECTOMY  1970   L4-5  . LUMBAR FUSION  2013  approx.   L1--L5  . ORIF HIP FRACTURE Left 01/17/2020   Procedure: OPEN REDUCTION INTERNAL FIXATION LEFT HIP GREATER TROCHANTER FRACTURE;  Surgeon: Paralee Cancel, MD;  Location: [facility];  Service: Orthopedics;  Laterality: Left;  90 MINS  . ORIF PERIPROSTHETIC FRACTURE Left 02/27/2020   Procedure: OPEN REDUCTION INTERNAL FIXATION (ORIF) LEFT PERI-PROSTETIC  HIP FRACTURE;  Surgeon: Paralee Cancel, MD;  Location: [facility];  Service: Orthopedics;  Laterality:  Left;  46mins  . ROTATOR CUFF REPAIR Right x5    last one 2007   5 surgeries in 9 days--- original repair then 4 sx'  I&D sx's for MRSA infection  . SHOULDER ARTHROSCOPY WITH DISTAL CLAVICLE RESECTION Left 1977  . SHOULDER SURGERY Left 1976  . TOTAL HIP ARTHROPLASTY Left 11/23/2015   Procedure: LEFT TOTAL HIP ARTHROPLASTY ANTERIOR APPROACH;  Surgeon: Paralee Cancel, MD;  Location: [facility];  Service: Orthopedics;  Laterality: Left;  . TOTAL HIP REVISION     10/14/16 Dr. Alvan Dame  . TOTAL HIP REVISION Left 10/14/2016   Procedure: TOTAL HIP REVISION POSTERIOR APPROACH;  Surgeon: Paralee Cancel, MD;  Location: [facility];  Service: Orthopedics;  Laterality: Left; (total of 4 hip surgeries in all)       Family History  Problem Relation Age of Onset  . Alcohol abuse Father   . Drug abuse Brother     Social History   Tobacco Use  . Smoking status: Never Smoker  . Smokeless tobacco: Never Used  Vaping Use  . Vaping Use: Never used  Substance Use Topics  . Alcohol use: No  . Drug use: No    Home Medications Prior to Admission medications   Medication Sig Start Date End Date Taking? Authorizing Provider  tranexamic acid (LYSTEDA) 650 MG TABS tablet Take 650 mg by mouth.   Yes [provider]  Ascorbic Acid (VITAMIN C) 1000 MG tablet Take 1,000 mg by mouth daily. Patient not taking: Reported on 04/17/2020    [provider]  aspirin 81 MG chewable tablet CHEW 1 TABLET BY MOUTH 2 TIMES DAILY FOR 28 DAYS 02/29/20 02/28/21  Maurice March, PA-C  cefTRIAXone (ROCEPHIN) IVPB Inject 2 g into the vein daily. Indication:  Presumed hip infection First Dose: Yes Last Day of Therapy:  06/14/2020 Labs - Once weekly:  CBC/D and BMP, Labs - Every other week:  ESR and CRP Method of administration: IV Push Method of administration may be changed at the discretion of home infusion pharmacist based upon assessment of the patient and/or caregiver's ability to self-administer the medication ordered.  05/04/20 06/14/20  Maurice March, PA-C  celecoxib (CELEBREX) 200 MG capsule TAKE 1 CAPSULE BY MOUTH 2 TIMES DAILY 02/29/20 02/28/21  Maurice March, PA-C  Cholecalciferol (VITAMIN D3 PO) Take 1 tablet by mouth daily. Patient not taking: Reported on 04/17/2020    [provider]  daptomycin (CUBICIN) IVPB Inject 900 mg into the vein daily. Indication:  Presumed hip infection First Dose: Yes Last Day of Therapy:  06/14/2020 Labs - Once weekly:  CBC/D, BMP, and CPK Labs - Every other week:  ESR and CRP Method of administration: IV Push Method of administration may  be changed at the discretion of home infusion pharmacist based upon assessment of the patient and/or caregiver's ability to self-administer the medication ordered. 05/04/20 06/14/20  Maurice March, PA-C  docusate sodium (COLACE) 100 MG capsule Take 1 capsule (100 mg total) by mouth 2 (two) times daily. Patient not taking: Reported on 04/17/2020 02/29/20   Maurice March, PA-C  ferrous sulfate 325 (65 FE) MG tablet TAKE 1 TABLET BY MOUTH 3 TIMES DAILY AFTER MEALS FOR 14 DAYS Patient not taking: Reported on 04/17/2020 02/29/20 02/28/21  Maurice March, PA-C  fluticasone [facility]) 50 MCG/ACT nasal spray Place 2 sprays into both nostrils daily. 01/11/20   Nche, Charlene Brooke, NP  HYDROcodone-acetaminophen (NORCO) 7.5-325 MG tablet TAKE 1 TO 2 TABLETS BY MOUTH EVERY 6 HOURS AS NEEDED FOR SEVERE PAIN (PAIN SCORE 7-10) Patient not taking: Reported on 04/17/2020 02/29/20 08/27/20  Maurice March, PA-C  methocarbamol (ROBAXIN) 500 MG tablet TAKE 1 TABLET BY MOUTH EVERY 6 HOURS AS NEEDED FOR MUSCLE SPASMS Patient not taking: Reported on 04/17/2020 02/29/20 02/28/21  Maurice March, PA-C  Multiple Vitamins-Minerals (ZINC PO) Take 1 tablet by mouth daily. Patient not taking: Reported on 04/17/2020    [provider]  polyethylene glycol (MIRALAX / GLYCOLAX) 17 g packet Take 17 g by mouth daily as needed for mild constipation. 05/04/20    Maurice March, PA-C  polyethylene glycol powder (GLYCOLAX/MIRALAX) 17 GM/SCOOP powder TAKE 17 G BY MOUTH 2 TIMES DAILY. 02/29/20 02/28/21  Maurice March, PA-C  prednisoLONE acetate (PRED FORTE) 1 % ophthalmic suspension Place 1 drop into the right eye 3 (three) times daily. Patient not taking: Reported on 04/17/2020    [provider]  traZODone (DESYREL) 100 MG tablet Take 1 tablet (100 mg total) by mouth at bedtime as needed. 03/23/20   Nche, Charlene Brooke, NP    Allergies    Aspartame and phenylalanine  Review of Systems   Review of Systems 10 systems reviewed and negative except as per HPI Physical Exam Updated Vital Signs BP (!) 119/98 (BP Location: Right Arm)   Pulse 100   Temp 98.5 F (36.9 C) (Oral)   Resp 18   Ht $R'6\' 7"'yQ$  (2.007 m)   Wt 136.1 kg   SpO2 95%   BMI 33.80 kg/m   Physical Exam Constitutional:      Comments: Patient is alert and nontoxic.  Mental status clear.  He has frequent harsh, staccato cough.  Appears mildly dyspneic.  HENT:     Mouth/Throat:     Pharynx: Oropharynx is clear.  Eyes:     Extraocular Movements: Extraocular movements intact.  Cardiovascular:     Comments: Borderline tachycardia.  No appreciable rub murmur gallop. Pulmonary:     Comments: Tachypnea.  Frequent paroxysmal harsh cough.  Breath sounds grossly clear.  Breath sounds diminished at the bases. Abdominal:     General: There is no distension.     Palpations: Abdomen is soft.     Tenderness: There is no abdominal tenderness. There is no guarding.  Musculoskeletal:     Comments: Patient has large swelling of the left upper extremity.  The entire extremity has about 2+ edema.  There is a PICC line in the medial upper arm.  There is a little bit of erythema and swelling at the insertion site.  No purulent drainage.  Radial pulses 2+ and strong.  The fingers and hand are warm and dry with brisk cap refill.  Mild diffuse erythema of the  arm.  Patient has a large approximately  20 cm soft mass on the lateral left hip.  There is a well-healed incision.  This is a area of seroma.  Not significantly erythematous or tender.  Patient can weight-bear.  Neurological:     General: No focal deficit present.     Mental Status: He is oriented to person, place, and time.     Coordination: Coordination normal.     ED Results / Procedures / Treatments   Labs (all labs ordered are listed, but only abnormal results are displayed) Labs Reviewed  BASIC METABOLIC PANEL - Abnormal; Notable for the following components:      Result Value   Glucose, Bld 110 (*)    BUN 27 (*)    Creatinine, Ser 1.46 (*)    Calcium 8.3 (*)    GFR, Estimated 50 (*)    All other components within normal limits  CBC WITH DIFFERENTIAL/PLATELET - Abnormal; Notable for the following components:   WBC 13.1 (*)    RBC 3.67 (*)    Hemoglobin 9.0 (*)    HCT 30.2 (*)    MCH 24.5 (*)    MCHC 29.8 (*)    RDW 17.7 (*)    Neutro Abs 10.9 (*)    Abs Immature Granulocytes 0.15 (*)    All other components within normal limits  HEPATIC FUNCTION PANEL - Abnormal; Notable for the following components:   Albumin 3.1 (*)    All other components within normal limits  BRAIN NATRIURETIC PEPTIDE - Abnormal; Notable for the following components:   B Natriuretic Peptide 208.7 (*)    All other components within normal limits  LACTIC ACID, PLASMA - Abnormal; Notable for the following components:   Lactic Acid, Venous 2.0 (*)    All other components within normal limits  URINALYSIS, ROUTINE W REFLEX MICROSCOPIC - Abnormal; Notable for the following components:   Protein, ur 30 (*)    All other components within normal limits  CULTURE, BLOOD (ROUTINE X 2)  CULTURE, BLOOD (ROUTINE X 2)  CULTURE, BLOOD (ROUTINE X 2) W REFLEX TO ID PANEL  RESPIRATORY PANEL BY PCR  LIPASE, BLOOD  PROTIME-INR  LACTIC ACID, PLASMA  POC SARS CORONAVIRUS 2 AG -  ED  TROPONIN I (HIGH SENSITIVITY)  TROPONIN I (HIGH SENSITIVITY)     EKG EKG Interpretation  Date/Time:  Thursday May 31 2020 21:20:16 EDT Ventricular Rate:  92 PR Interval:  134 QRS Duration: 113 QT Interval:  378 QTC Calculation: 468 R Axis:   52 Text Interpretation: Sinus rhythm Incomplete right bundle branch block Low voltage, precordial leads no sig change few PVC and PAC Confirmed by Charlesetta Shanks [phone]) on 05/31/2020 9:31:27 PM   Radiology CT Angio Chest PE W/Cm &/Or Wo Cm  Result Date: 05/31/2020 CLINICAL DATA:  75 year old male with left upper extremity swelling. Concern for pulmonary embolism. EXAM: CT ANGIOGRAPHY CHEST WITH CONTRAST TECHNIQUE: Multidetector CT imaging of the chest was performed using the standard protocol during bolus administration of intravenous contrast. Multiplanar CT image reconstructions and MIPs were obtained to evaluate the vascular anatomy. CONTRAST:  121mL OMNIPAQUE IOHEXOL 350 MG/ML SOLN COMPARISON:  None. FINDINGS: Evaluation of this exam is limited due to respiratory motion artifact. Cardiovascular: Mild cardiomegaly. No pericardial effusion. There is coronary vascular calcification. The thoracic aorta is unremarkable. The origins of the great vessels of the aortic arch appear patent. Evaluation of the pulmonary arteries is very limited due to respiratory motion artifact and suboptimal visualization of the  peripheral branches. No large central pulmonary artery embolus identified. Mediastinum/Nodes: Mildly enlarged right hilar lymph node measures 13 mm in short axis. The esophagus is grossly unremarkable. No mediastinal fluid collection. Lungs/Pleura: Small bilateral pleural effusions. There is mild diffuse interstitial and interlobular septal thickening, likely mild edema. Bilateral patchy ground-glass opacity most concerning for multilobar pneumonia. Clinical correlation and follow-up to resolution recommended. No pneumothorax. The central airways are patent. Upper Abdomen: Partially visualized bilateral renal cysts  measure up to 7.5 cm on the left. Musculoskeletal: Degenerative changes of the spine. No acute osseous pathology. Review of the MIP images confirms the above findings. IMPRESSION: 1. No CT evidence of central pulmonary artery embolus. 2. Mild cardiomegaly and small bilateral pleural effusions may represent mild CHF. 3. Bilateral patchy ground-glass opacity most concerning for multilobar pneumonia. Clinical correlation and follow-up to resolution recommended. 4. Mildly enlarged right hilar lymph node, likely reactive. 5. Aortic Atherosclerosis (ICD10-I70.0). Electronically Signed   By: Anner Crete M.D.   On: 05/31/2020 21:17   UE VENOUS DUPLEX ([facility] 7 am - 7 pm)  Result Date: 05/31/2020 UPPER VENOUS STUDY  Indications: Swelling, and Edema Comparison Study: no prior Performing Technologist: Abram Sander RVS  Examination Guidelines: A complete evaluation includes B-mode imaging, spectral Doppler, color Doppler, and power Doppler as needed of all accessible portions of each vessel. Bilateral testing is considered an integral part of a complete examination. Limited examinations for reoccurring indications may be performed as noted.  Left Findings: +----------+------------+---------+-----------+----------+-------+ LEFT      CompressiblePhasicitySpontaneousPropertiesSummary +----------+------------+---------+-----------+----------+-------+ IJV           Full       Yes       Yes                      +----------+------------+---------+-----------+----------+-------+ Subclavian    Full       Yes       Yes                      +----------+------------+---------+-----------+----------+-------+ Axillary      Full       Yes       Yes                      +----------+------------+---------+-----------+----------+-------+ Brachial      Full       Yes       Yes                      +----------+------------+---------+-----------+----------+-------+ Radial        Full                                           +----------+------------+---------+-----------+----------+-------+ Ulnar         Full                                          +----------+------------+---------+-----------+----------+-------+ Cephalic      Full                                          +----------+------------+---------+-----------+----------+-------+ Basilic       Full                                          +----------+------------+---------+-----------+----------+-------+  Summary:  Left: No evidence of deep vein thrombosis in the upper extremity. No evidence of superficial vein thrombosis in the upper extremity. No evidence of thrombosis in the subclavian.  *See table(s) above for measurements and observations.  Diagnosing physician: Harold Barban MD Electronically signed by Harold Barban MD on 05/31/2020 at 9:29:04 PM.    Final     Procedures Procedures  CRITICAL CARE Performed by: Charlesetta Shanks   Total critical care time: 45 minutes  Critical care time was exclusive of separately billable procedures and treating other patients.  Critical care was necessary to treat or prevent imminent or life-threatening deterioration.  Critical care was time spent personally by me on the following activities: development of treatment plan with patient and/or surrogate as well as nursing, discussions with consultants, evaluation of patient's response to treatment, examination of patient, obtaining history from patient or surrogate, ordering and performing treatments and interventions, ordering and review of laboratory studies, ordering and review of radiographic studies, pulse oximetry and re-evaluation of patient's condition. Medications Ordered in ED Medications  HYDROcodone-homatropine (HYCODAN) 5-1.5 MG/5ML syrup 5 mL (5 mLs Oral Given 05/31/20 2042)  lactated ringers infusion ( Intravenous New Bag/Given 05/31/20 2127)  lactated ringers infusion (has no administration in time range)   lactated ringers bolus 1,000 mL (has no administration in time range)  vancomycin (VANCOCIN) IVPB 1000 mg/200 mL premix (has no administration in time range)  ceFEPIme (MAXIPIME) 2 g in sodium chloride 0.9 % 100 mL IVPB (has no administration in time range)  ipratropium-albuterol (DUONEB) 0.5-2.5 (3) MG/3ML nebulizer solution 3 mL (has no administration in time range)  ipratropium-albuterol (DUONEB) 0.5-2.5 (3) MG/3ML nebulizer solution 3 mL (3 mLs Nebulization Given 05/31/20 2026)  iohexol (OMNIPAQUE) 350 MG/ML injection 100 mL (100 mLs Intravenous Contrast Given 05/31/20 2050)  pantoprazole (PROTONIX) injection 40 mg (40 mg Intravenous Given 05/31/20 2121)    ED Course  I have reviewed the triage vital signs and the nursing notes.  Pertinent labs & imaging results that were available during my care of the patient were reviewed by me and considered in my medical decision making (see chart for details).    MDM Rules/Calculators/A&P                         Patient presents as outlined with large swelling of the left upper extremity PICC line in place.  No upper extremity DVT present.  Findings suggestive of cellulitis.  Patient has 13,000 white count.  Patient is also had persistent cough for a week.  CT scan shows multifocal pneumonia.  No PE.  Patient has been on Rocephin and daptomycin for persisting seroma over a complex left hip replacement with revisions and subsequent concern for infection.  Patient has source of infection.  He is not hypotensive or febrile.  Heart rate is elevated at 100 patient is tachypneic.  Sepsis protocol initiated.  At this time, without hypotension or significant tachycardia will initiate fluid bolus of 1000 mL and continue to closely monitor blood pressures and heart rate.  Consultation placed for orthopedics, infectious disease and hospitalist.   Dr. Jonelle Sidle for admission.  [facility] hospitalist.  Consult: Dr. Juleen China infectious disease.  Recommends to DC the PICC line  since it appears the patient has cellulitis on that side.  Agrees with vancomycin and cefepime for pneumonia and sepsis.  Do not need to culture the PICC line tip.  Peripheral cultures are acceptable.  Consult: Dr. Sharol Given made aware of patient's admission.  He will make Dr. Alvan Dame aware for following any issues regarding the patient's underlying issue with his hip and seroma.  Final Clinical Impression(s) / ED Diagnoses Final diagnoses:  HCAP (healthcare-associated pneumonia)  Cellulitis of left upper extremity  PICC line infection, initial encounter  Severe comorbid illness    Rx / DC Orders ED Discharge Orders    None       Charlesetta Shanks, MD 05/31/20 2241    Charlesetta Shanks, MD 05/31/20 Octavia Heir    Charlesetta Shanks, MD 05/31/20 2318

## 2020-06-01 ENCOUNTER — Ambulatory Visit: Payer: Medicare Other | Admitting: Family

## 2020-06-01 DIAGNOSIS — Z96642 Presence of left artificial hip joint: Secondary | ICD-10-CM

## 2020-06-01 DIAGNOSIS — J189 Pneumonia, unspecified organism: Secondary | ICD-10-CM | POA: Diagnosis not present

## 2020-06-01 DIAGNOSIS — L03114 Cellulitis of left upper limb: Secondary | ICD-10-CM | POA: Diagnosis not present

## 2020-06-01 DIAGNOSIS — N189 Chronic kidney disease, unspecified: Secondary | ICD-10-CM | POA: Diagnosis not present

## 2020-06-01 DIAGNOSIS — T80212A Local infection due to central venous catheter, initial encounter: Secondary | ICD-10-CM | POA: Diagnosis not present

## 2020-06-01 DIAGNOSIS — R600 Localized edema: Secondary | ICD-10-CM

## 2020-06-01 DIAGNOSIS — T8452XA Infection and inflammatory reaction due to internal left hip prosthesis, initial encounter: Secondary | ICD-10-CM

## 2020-06-01 DIAGNOSIS — R6520 Severe sepsis without septic shock: Secondary | ICD-10-CM

## 2020-06-01 DIAGNOSIS — A419 Sepsis, unspecified organism: Secondary | ICD-10-CM | POA: Diagnosis not present

## 2020-06-01 LAB — RESPIRATORY PANEL BY PCR

## 2020-06-01 LAB — COMPREHENSIVE METABOLIC PANEL
ALT: 18 U/L (ref 0–44)
AST: 23 U/L (ref 15–41)
Albumin: 2.8 g/dL — ABNORMAL LOW (ref 3.5–5.0)
Alkaline Phosphatase: 107 U/L (ref 38–126)
Anion gap: 9 (ref 5–15)
BUN: 30 mg/dL — ABNORMAL HIGH (ref 8–23)
CO2: 22 mmol/L (ref 22–32)
Calcium: 8.1 mg/dL — ABNORMAL LOW (ref 8.9–10.3)
Chloride: 105 mmol/L (ref 98–111)
Creatinine, Ser: 1.35 mg/dL — ABNORMAL HIGH (ref 0.61–1.24)
GFR, Estimated: 54 mL/min — ABNORMAL LOW (ref >60)
Glucose, Bld: 109 mg/dL — ABNORMAL HIGH (ref 70–99)
Potassium: 3.9 mmol/L (ref 3.5–5.1)
Sodium: 136 mmol/L (ref 135–145)
Total Bilirubin: 0.5 mg/dL (ref 0.3–1.2)
Total Protein: 6 g/dL — ABNORMAL LOW (ref 6.5–8.1)

## 2020-06-01 LAB — PROTIME-INR
INR: 1.3 — ABNORMAL HIGH (ref 0.8–1.2)
Prothrombin Time: 16 seconds — ABNORMAL HIGH (ref 11.4–15.2)

## 2020-06-01 LAB — CBC
HCT: 28.2 % — ABNORMAL LOW (ref 39.0–52.0)
Hemoglobin: 8.3 g/dL — ABNORMAL LOW (ref 13.0–17.0)
MCH: 24.5 pg — ABNORMAL LOW (ref 26.0–34.0)
MCHC: 29.4 g/dL — ABNORMAL LOW (ref 30.0–36.0)
MCV: 83.2 fL (ref 80.0–100.0)
Platelets: 346 10*3/uL (ref 150–400)
RBC: 3.39 MIL/uL — ABNORMAL LOW (ref 4.22–5.81)
RDW: 18 % — ABNORMAL HIGH (ref 11.5–15.5)
WBC: 12.1 10*3/uL — ABNORMAL HIGH (ref 4.0–10.5)
nRBC: 0 % (ref 0.0–0.2)

## 2020-06-01 LAB — CORTISOL-AM, BLOOD: Cortisol - AM: 38 ug/dL — ABNORMAL HIGH (ref 6.7–22.6)

## 2020-06-01 LAB — PROCALCITONIN: Procalcitonin: 0.43 ng/mL

## 2020-06-01 MED ORDER — ONDANSETRON HCL 4 MG/2ML IJ SOLN: 4.0000 mg | Freq: Four times a day (QID) | INTRAMUSCULAR | Status: DC | PRN

## 2020-06-01 MED ORDER — VANCOMYCIN HCL 1500 MG/300ML IV SOLN
1500.0000 mg | INTRAVENOUS | Status: DC
  Administered 2020-06-01 – 2020-06-03 (×2): 1500 mg via INTRAVENOUS
  Filled 2020-06-01 (×2): qty 300

## 2020-06-01 MED ORDER — LACTATED RINGERS IV SOLN: INTRAVENOUS | Status: DC

## 2020-06-01 MED ORDER — ACETAMINOPHEN 325 MG PO TABS: 650.0000 mg | ORAL_TABLET | Freq: Four times a day (QID) | ORAL | Status: DC | PRN

## 2020-06-01 MED ORDER — ONDANSETRON HCL 4 MG PO TABS: 4.0000 mg | ORAL_TABLET | Freq: Four times a day (QID) | ORAL | Status: DC | PRN

## 2020-06-01 MED ORDER — SODIUM CHLORIDE 0.9 % IV SOLN: 2.0000 g | Freq: Once | INTRAVENOUS | Status: DC

## 2020-06-01 MED ORDER — VANCOMYCIN HCL 1000 MG/200ML IV SOLN: 1000.0000 mg | Freq: Once | INTRAVENOUS | Status: DC

## 2020-06-01 MED ORDER — ENOXAPARIN SODIUM 40 MG/0.4ML ~~LOC~~ SOLN
40.0000 mg | SUBCUTANEOUS | Status: DC
  Administered 2020-06-01 – 2020-06-02 (×2): 40 mg via SUBCUTANEOUS
  Filled 2020-06-01 (×2): qty 0.4

## 2020-06-01 MED ORDER — ACETAMINOPHEN 650 MG RE SUPP: 650.0000 mg | Freq: Four times a day (QID) | RECTAL | Status: DC | PRN

## 2020-06-01 MED ORDER — SODIUM CHLORIDE 0.9 % IV SOLN
2.0000 g | Freq: Three times a day (TID) | INTRAVENOUS | Status: AC
  Administered 2020-06-01 – 2020-06-06 (×17): 2 g via INTRAVENOUS
  Filled 2020-06-01 (×18): qty 2

## 2020-06-01 NOTE — Consult Note (Signed)
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 #PICC line associated SSTI and edema: Unable to have PICC line removed overnight by IV team.  Markedly edematous in his left upper extremity with mild warmth and erythema. #Pneumonia: CTA chest on admission with findings concerning for multilobar pneumonia in the setting of worsening cough.  Also with new oxygen requirement and leukocytosis.  Antibiotics broadened to vancomycin and cefepime as this infection developed while previously on daptomycin (which does not provide adequate lung coverage) and ceftriaxone.  Of note, patient developed worsening pulmonary symptoms while on daptomycin which also raises the possibility of an eosinophilic pneumonia.  Absolute eosinophils noted to be normal, however, this does not exclude the diagnosis.  -Continue cefepime and vancomycin -Recommend removal of PICC line due to concern for cellulitis and marked upper extremity edema -Follow-up blood cultures to ensure no bacteremia -Sputum culture if producing sputum -I am available as needed over the weekend, otherwise Dr Tommy Medal will take over Monday  #CKD: Creatinine on admission 1.46 improved to 1.35 this morning -Continue to monitor   Principal Problem:   Sepsis ([place]) Active Problems:   S/P left THA, AA   S/P revision of total hip   Obese   HCAP (healthcare-associated pneumonia)   Cellulitis   MEDICATIONS:    Scheduled Meds: . enoxaparin (LOVENOX) injection  40 mg Subcutaneous Q24H    Continuous Infusions: . ceFEPime (MAXIPIME) IV 2 g (06/01/20 0759)  . lactated ringers Stopped (05/31/20 2318)  . lactated ringers 150 mL/hr at 05/31/20 2318  . lactated ringers 125 mL/hr at 06/01/20 0806  . [START ON 06/02/2020] vancomycin     PRN Meds:.acetaminophen **OR** acetaminophen, HYDROcodone-homatropine, ipratropium-albuterol, ondansetron **OR** ondansetron (ZOFRAN) IV  HPI:    Jacob Mitchell is a 75 y.o. male with a past medical history significant for left hip prosthesis that has been complicated by multiple revisions due to subluxation and recurrent left hip seroma.  He is currently on daptomycin and ceftriaxone via upper extremity PICC line managed by Dr. Baxter Flattery.  Recent history includes a fall in November 2021 with left trochanteric femur fracture and status post ORIF in December 2021 but found to have periprosthetic fracture requiring second ORIF in mid January 2022.  He had a seroma/hematoma post procedure requiring drainage but also developed some erythema to his incision and swelling to the left hip/thigh.  He was treated with a few courses of oral antibiotics with out improvement.  He went back to the OR for I&D on 05/01/2020.  OR cultures were negative and he was discharged on daptomycin and ceftriaxone x6 weeks via upper extremity PICC line.  He presented to the hospital last evening due to swelling and pain around his left PICC line site as well as some possible and warmth/erythema.  He had not been having any fevers or chills that he can recall.  His main other complaint was significant cough and some mild shortness of breath.  The cough in particular has significantly worsened over the past 2 weeks.  Initial  ED evaluation included Doppler ultrasound of the left upper extremity which was negative for DVT.  There was concern for suspected cellulitis around the PICC line site and removal was attempted in the evening last night however was unsuccessful.  Patient was found to be  afebrile, blood pressure 277 systolic, O2 sat 82% on room air, WBC 13, hemoglobin 9, platelets 387.  BUN 27, creatinine 1.5.  CT angiogram of the chest was negative for pulmonary embolus but showed mild cardiomegaly and bilateral pleural effusions with bilateral patchy groundglass opacities consistent with multilobar pneumonia.  He was prescribed vancomycin and cefepime and subsequently admitted to the hospital.  He had a negative COVID screen and follow-up respiratory viral pathogen panel was negative.   Past Medical History:  Diagnosis Date  . Benign localized prostatic hyperplasia with lower urinary tract symptoms (LUTS)   . Complication of anesthesia    pt says he gets "restless" when he is waking up and nurses have had to "hold him down"- happened several surgeries ago   . Diverticulosis of colon   . History of colon polyps   . History of kidney stones   . History of MRSA infection 2007   post right shoulder surgery  . History of urinary retention 05/31/2017   due to BPH  . Incomplete emptying of bladder   . OA (osteoarthritis)   . Peripheral neuropathy    feet-- hx frost bite  . Pinched nerve in neck    right index and middle fingers numbness  . Wears glasses   . Wears hearing aid in both ears     Social History   Tobacco Use  . Smoking status: Never Smoker  . Smokeless tobacco: Never Used  Vaping Use  . Vaping Use: Never used  Substance Use Topics  . Alcohol use: No  . Drug use: No    Family History  Problem Relation Age of Onset  . Alcohol abuse Father   . Drug abuse Brother     Allergies  Allergen Reactions  . Aspartame And Phenylalanine Palpitations    Artificial sweetener- hrt 140  went to ED    Review of Systems  Constitutional: Negative for chills and fever.  Respiratory: Positive for cough and shortness of breath.   Cardiovascular: Negative.   Gastrointestinal: Negative.   Skin: Positive for rash.  All other systems reviewed and are  negative.   OBJECTIVE:   Blood pressure 124/77, pulse 72, temperature 97.8 F (36.6 C), temperature source Oral, resp. rate 18, height 6\' 7"  (2.007 m), weight 136.1 kg, SpO2 93 %. Body mass index is 33.8 kg/m.  Physical Exam Constitutional:      General: He is not in acute distress.    Appearance: Normal appearance.  HENT:     Head: Normocephalic and atraumatic.  Eyes:     Extraocular Movements: Extraocular movements intact.     Conjunctiva/sclera: Conjunctivae normal.  Pulmonary:     Effort: Pulmonary effort is normal. No respiratory distress.     Comments: On nasal cannula and coughing frequently.  Abdominal:     General: There is no distension.     Palpations: Abdomen is soft.     Tenderness: There is no abdominal tenderness.  Musculoskeletal:        General: Swelling present. No tenderness.     Comments: Left hip without pain or tenderness. Left UE PICC site with minimal erythema, but is warm to touch and arm is swollen compared to right.   Neurological:  General: No focal deficit present.     Mental Status: He is alert and oriented to person, place, and time.  Psychiatric:        Mood and Affect: Mood normal.        Behavior: Behavior normal.      Lab Results: Lab Results  Component Value Date   WBC 12.1 (H) 06/01/2020   HGB 8.3 (L) 06/01/2020   HCT 28.2 (L) 06/01/2020   MCV 83.2 06/01/2020   PLT 346 06/01/2020    Lab Results  Component Value Date   NA 136 06/01/2020   K 3.9 06/01/2020   CO2 22 06/01/2020   GLUCOSE 109 (H) 06/01/2020   BUN 30 (H) 06/01/2020   CREATININE 1.35 (H) 06/01/2020   CALCIUM 8.1 (L) 06/01/2020   GFRNONAA 54 (L) 06/01/2020   GFRAA >60 08/29/2018    Lab Results  Component Value Date   ALT 18 06/01/2020   AST 23 06/01/2020   ALKPHOS 107 06/01/2020   BILITOT 0.5 06/01/2020       Component Value Date/Time   CRP 0.6 05/04/2020 0418       Component Value Date/Time   ESRSEDRATE 6 05/04/2020 0418    I have reviewed  the micro and lab results in Epic.  Imaging: CT Angio Chest PE W/Cm &/Or Wo Cm  Result Date: 05/31/2020 CLINICAL DATA:  75 year old male with left upper extremity swelling. Concern for pulmonary embolism. EXAM: CT ANGIOGRAPHY CHEST WITH CONTRAST TECHNIQUE: Multidetector CT imaging of the chest was performed using the standard protocol during bolus administration of intravenous contrast. Multiplanar CT image reconstructions and MIPs were obtained to evaluate the vascular anatomy. CONTRAST:  148mL OMNIPAQUE IOHEXOL 350 MG/ML SOLN COMPARISON:  None. FINDINGS: Evaluation of this exam is limited due to respiratory motion artifact. Cardiovascular: Mild cardiomegaly. No pericardial effusion. There is coronary vascular calcification. The thoracic aorta is unremarkable. The origins of the great vessels of the aortic arch appear patent. Evaluation of the pulmonary arteries is very limited due to respiratory motion artifact and suboptimal visualization of the peripheral branches. No large central pulmonary artery embolus identified. Mediastinum/Nodes: Mildly enlarged right hilar lymph node measures 13 mm in short axis. The esophagus is grossly unremarkable. No mediastinal fluid collection. Lungs/Pleura: Small bilateral pleural effusions. There is mild diffuse interstitial and interlobular septal thickening, likely mild edema. Bilateral patchy ground-glass opacity most concerning for multilobar pneumonia. Clinical correlation and follow-up to resolution recommended. No pneumothorax. The central airways are patent. Upper Abdomen: Partially visualized bilateral renal cysts measure up to 7.5 cm on the left. Musculoskeletal: Degenerative changes of the spine. No acute osseous pathology. Review of the MIP images confirms the above findings. IMPRESSION: 1. No CT evidence of central pulmonary artery embolus. 2. Mild cardiomegaly and small bilateral pleural effusions may represent mild CHF. 3. Bilateral patchy ground-glass opacity  most concerning for multilobar pneumonia. Clinical correlation and follow-up to resolution recommended. 4. Mildly enlarged right hilar lymph node, likely reactive. 5. Aortic Atherosclerosis (ICD10-I70.0). Electronically Signed   By: Anner Crete M.D.   On: 05/31/2020 21:17   UE VENOUS DUPLEX ([facility] 7 am - 7 pm)  Result Date: 05/31/2020 UPPER VENOUS STUDY  Indications: Swelling, and Edema Comparison Study: no prior Performing Technologist: Abram Sander RVS  Examination Guidelines: A complete evaluation includes B-mode imaging, spectral Doppler, color Doppler, and power Doppler as needed of all accessible portions of each vessel. Bilateral testing is considered an integral part of a complete examination. Limited examinations for reoccurring indications  may be performed as noted.  Left Findings: +----------+------------+---------+-----------+----------+-------+ LEFT      CompressiblePhasicitySpontaneousPropertiesSummary +----------+------------+---------+-----------+----------+-------+ IJV           Full       Yes       Yes                      +----------+------------+---------+-----------+----------+-------+ Subclavian    Full       Yes       Yes                      +----------+------------+---------+-----------+----------+-------+ Axillary      Full       Yes       Yes                      +----------+------------+---------+-----------+----------+-------+ Brachial      Full       Yes       Yes                      +----------+------------+---------+-----------+----------+-------+ Radial        Full                                          +----------+------------+---------+-----------+----------+-------+ Ulnar         Full                                          +----------+------------+---------+-----------+----------+-------+ Cephalic      Full                                           +----------+------------+---------+-----------+----------+-------+ Basilic       Full                                          +----------+------------+---------+-----------+----------+-------+  Summary:  Left: No evidence of deep vein thrombosis in the upper extremity. No evidence of superficial vein thrombosis in the upper extremity. No evidence of thrombosis in the subclavian.  *See table(s) above for measurements and observations.  Diagnosing physician: Harold Barban MD Electronically signed by Harold Barban MD on 05/31/2020 at 9:29:04 PM.    Final      Imaging  independently reviewed in Epic.  [facility] [phone] pager 06/01/2020, 1:54 PM

## 2020-06-01 NOTE — Progress Notes (Signed)
PROGRESS NOTE    Jacob Mitchell    Code Status: Full Code  [id number] DOB: November 07, 1945 DOA: 05/31/2020 LOS: 1 days  PCP: [name], NP CC: No chief complaint on file.      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  ED Course: Temperature is 98.5, blood pressure 141/115, pulse 100 respiratory 24 oxygen sat 91% on room air.  White count is 13.1 hemoglobin 9.1 platelet count of 387.  Chemistry showed BUN 27 creatinine 1.46 calcium 8.3.  Glucose 110 and lactic acid 2.0.  Urinalysis essentially negative.  Respiratory panel is currently pending.  LUE [place] negative for DVT. CTA chest showed no PE.  There is mild cardiomegaly and small bilateral pleural effusions.  There is bilateral patchy groundglass opacity consistent with multilobar pneumonia.  ID and orthopedic surgery were consulted.     A & P   Principal Problem:   Sepsis ([place]) Active Problems:   S/P left THA, AA   S/P revision of total hip   Obese   HCAP (healthcare-associated pneumonia)   Cellulitis   1. Severe Sepsis without septic shock, multifactorial: HAP and LUE Cellulitis a. Sepsis criteria: tachycardia, tachypnea, leukocytosis, lactic acidosis b. CTA chest concerning for multifocal pneumonia  c. 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 Respiratory viral panel negative e. Discontinue IV fluids f. ID: Continue cefepime and vancomycin, remove PICC line, follow up blood cultures, sputum culture if producing sputum. May have eosinophilic  pneumonia as he developed the respiratory symptoms while on daptomycin  2.  Recurrent Left Hip Seroma  a. Ortho on board, appreciate recommendations  3. Elevated creatinine in the setting of CKD 3b a. Cr baseline 1.0-1.3, peaked at 1.4 this at presentation and now at baseline b. Discontinue IV fluids  4. Obesity a. Body mass index is 33.8 kg/m.  b. Lifestyle modification  5.   DVT prophylaxis: enoxaparin (LOVENOX) injection 40 mg Start: 06/01/20 1000   Family Communication: no family at bedside  Disposition Plan:  Status is: Inpatient  Remains inpatient appropriate because:IV treatments appropriate due to intensity of illness or inability to take PO and Inpatient level of care appropriate due to severity of illness   Dispo: The patient is from: Home              Anticipated d/c is to: Home              Patient currently is not medically stable to d/c.   Difficult to place patient No           Pressure injury documentation    None  Consultants  ID Ortho  Procedures  LUE PICC line removal   Antibiotics   Anti-infectives (From admission, onward)   Start     Dose/Rate Route Frequency Ordered Stop   06/02/20 0000  vancomycin (VANCOREADY) IVPB 1500 mg/300 mL        1,500 mg 150 mL/hr over 120 Minutes Intravenous Every 24 hours 06/01/20 0129     06/01/20 0800  ceFEPIme (MAXIPIME) 2 g in sodium chloride 0.9 %  100 mL IVPB        2 g 200 mL/hr over 30 Minutes Intravenous Every 8 hours 06/01/20 0129     06/01/20 0400  vancomycin (VANCOREADY) IVPB 1000 mg/200 mL  Status:  Discontinued        1,000 mg 200 mL/hr over 60 Minutes Intravenous  Once 06/01/20 0300 06/01/20 0307   06/01/20 0400  ceFEPIme (MAXIPIME) 2 g in sodium chloride 0.9 % 100 mL IVPB  Status:  Discontinued        2 g 200 mL/hr over 30 Minutes Intravenous  Once 06/01/20 0300 06/01/20 0307   05/31/20 2245  vancomycin (VANCOCIN) IVPB 1000 mg/200 mL premix  Status:  Discontinued        1,000 mg 200  mL/hr over 60 Minutes Intravenous  Once 05/31/20 2232 05/31/20 2235   05/31/20 2245  ceFEPIme (MAXIPIME) 2 g in sodium chloride 0.9 % 100 mL IVPB        2 g 200 mL/hr over 30 Minutes Intravenous  Once 05/31/20 2232 06/01/20 0132   05/31/20 2245  vancomycin (VANCOREADY) IVPB 2000 mg/400 mL        2,000 mg 200 mL/hr over 120 Minutes Intravenous  Once 05/31/20 2235 06/01/20 0233        Subjective   Patient seen and examined at bedside in no acute distress and resting comfortably. No acute events overnight. Still with LUE swelling but not significantly tender. Feels improved but with a dry cough. Admits to recent DOE but normally can walk 1.5 miles daily. Tolerating diet well.   Objective   Vitals:   06/01/20 0220 06/01/20 0258 06/01/20 0628 06/01/20 1041  BP: 111/62 138/65 130/62 124/77  Pulse: (!) 110 94 82 72  Resp: (!) 23 20 16 18   Temp:  99.5 F (37.5 C) 98.2 F (36.8 C) 97.8 F (36.6 C)  TempSrc:  Oral Oral Oral  SpO2: 92% 93% 94% 93%  Weight:      Height:        Intake/Output Summary (Last 24 hours) at 06/01/2020 1433 Last data filed at 06/01/2020 1054 Gross per 24 hour  Intake 3272.64 ml  Output 450 ml  Net 2822.64 ml   Filed Weights   05/31/20 1741  Weight: 136.1 kg    Examination:  Physical Exam Vitals and nursing note reviewed.  Constitutional:      Appearance: Normal appearance.  HENT:     Head: Normocephalic and atraumatic.  Eyes:     Conjunctiva/sclera: Conjunctivae normal.  Cardiovascular:     Rate and Rhythm: Normal rate and regular rhythm.  Pulmonary:     Effort: Pulmonary effort is normal.     Breath sounds: Normal breath sounds. No rales.  Abdominal:     General: Abdomen is flat.     Palpations: Abdomen is soft.  Musculoskeletal:     Comments: LUE edema with PICC line in place. No significant erythema, warmth or purulence  Skin:    Coloration: Skin is not jaundiced or pale.  Neurological:     Mental Status: He is alert. Mental status is  at baseline.  Psychiatric:        Mood and Affect: Mood normal.        Behavior: Behavior normal.     Data Reviewed: I have personally reviewed following labs and imaging studies  CBC: Recent Labs  Lab 05/31/20 1742 06/01/20 0321  WBC 13.1* 12.1*  NEUTROABS 10.9*  --   HGB 9.0* 8.3*  HCT 30.2* 28.2*  MCV  82.3 83.2  PLT 387 [id number]   Basic Metabolic Panel: Recent Labs  Lab 05/31/20 1742 06/01/20 0321  NA 143 136  K 4.0 3.9  CL 107 105  CO2 24 22  GLUCOSE 110* 109*  BUN 27* 30*  CREATININE 1.46* 1.35*  CALCIUM 8.3* 8.1*   GFR: Estimated Creatinine Clearance: 72.9 mL/min (A) (by C-G formula based on SCr of 1.35 mg/dL (H)). Liver Function Tests: Recent Labs  Lab 05/31/20 2026 06/01/20 0321  AST 26 23  ALT 20 18  ALKPHOS 126 107  BILITOT 0.5 0.5  PROT 7.0 6.0*  ALBUMIN 3.1* 2.8*   Recent Labs  Lab 05/31/20 2026  LIPASE 23   No results for input(s): AMMONIA in the last 168 hours. Coagulation Profile: Recent Labs  Lab 05/31/20 2026 06/01/20 0321  INR 1.2 1.3*   Cardiac Enzymes: No results for input(s): CKTOTAL, CKMB, CKMBINDEX, TROPONINI in the last 168 hours. BNP (last 3 results) No results for input(s): PROBNP in the last 8760 hours. HbA1C: No results for input(s): HGBA1C in the last 72 hours. CBG: No results for input(s): GLUCAP in the last 168 hours. Lipid Profile: No results for input(s): CHOL, HDL, LDLCALC, TRIG, CHOLHDL, LDLDIRECT in the last 72 hours. Thyroid Function Tests: No results for input(s): TSH, T4TOTAL, FREET4, T3FREE, THYROIDAB in the last 72 hours. Anemia Panel: No results for input(s): VITAMINB12, FOLATE, FERRITIN, TIBC, IRON, RETICCTPCT in the last 72 hours. Sepsis Labs: Recent Labs  Lab 05/31/20 2026 05/31/20 2230 06/01/20 0321  PROCALCITON  --   --  0.43  LATICACIDVEN 2.0* 1.5  --     Recent Results (from the past 240 hour(s))  Respiratory (~20 pathogens) panel by PCR     Status: None   Collection Time: 05/31/20  10:02 PM   Specimen: Nasopharyngeal Swab; Respiratory  Result Value Ref Range Status   Adenovirus NOT DETECTED NOT DETECTED Final   Coronavirus 229E NOT DETECTED NOT DETECTED Final    Comment: (NOTE) The Coronavirus on the Respiratory Panel, DOES NOT test for the novel  Coronavirus (2019 nCoV)    Coronavirus HKU1 NOT DETECTED NOT DETECTED Final   Coronavirus NL63 NOT DETECTED NOT DETECTED Final   Coronavirus OC43 NOT DETECTED NOT DETECTED Final   Metapneumovirus NOT DETECTED NOT DETECTED Final   Rhinovirus / Enterovirus NOT DETECTED NOT DETECTED Final   Influenza A NOT DETECTED NOT DETECTED Final   Influenza B NOT DETECTED NOT DETECTED Final   Parainfluenza Virus 1 NOT DETECTED NOT DETECTED Final   Parainfluenza Virus 2 NOT DETECTED NOT DETECTED Final   Parainfluenza Virus 3 NOT DETECTED NOT DETECTED Final   Parainfluenza Virus 4 NOT DETECTED NOT DETECTED Final   Respiratory Syncytial Virus NOT DETECTED NOT DETECTED Final   Bordetella pertussis NOT DETECTED NOT DETECTED Final   Bordetella Parapertussis NOT DETECTED NOT DETECTED Final   Chlamydophila pneumoniae NOT DETECTED NOT DETECTED Final   Mycoplasma pneumoniae NOT DETECTED NOT DETECTED Final    Comment: Performed at [facility], [place]. [address], [place], [place] [address]         Radiology Studies: CT Angio Chest PE W/Cm &/Or Wo Cm  Result Date: 05/31/2020 CLINICAL DATA:  75 year old male with left upper extremity swelling. Concern for pulmonary embolism. EXAM: CT ANGIOGRAPHY CHEST WITH CONTRAST TECHNIQUE: Multidetector CT imaging of the chest was performed using the standard protocol during bolus administration of intravenous contrast. Multiplanar CT image reconstructions and MIPs were obtained to evaluate the vascular anatomy. CONTRAST:  175mL OMNIPAQUE  IOHEXOL 350 MG/ML SOLN COMPARISON:  None. FINDINGS: Evaluation of this exam is limited due to respiratory motion artifact. Cardiovascular: Mild cardiomegaly. No  pericardial effusion. There is coronary vascular calcification. The thoracic aorta is unremarkable. The origins of the great vessels of the aortic arch appear patent. Evaluation of the pulmonary arteries is very limited due to respiratory motion artifact and suboptimal visualization of the peripheral branches. No large central pulmonary artery embolus identified. Mediastinum/Nodes: Mildly enlarged right hilar lymph node measures 13 mm in short axis. The esophagus is grossly unremarkable. No mediastinal fluid collection. Lungs/Pleura: Small bilateral pleural effusions. There is mild diffuse interstitial and interlobular septal thickening, likely mild edema. Bilateral patchy ground-glass opacity most concerning for multilobar pneumonia. Clinical correlation and follow-up to resolution recommended. No pneumothorax. The central airways are patent. Upper Abdomen: Partially visualized bilateral renal cysts measure up to 7.5 cm on the left. Musculoskeletal: Degenerative changes of the spine. No acute osseous pathology. Review of the MIP images confirms the above findings. IMPRESSION: 1. No CT evidence of central pulmonary artery embolus. 2. Mild cardiomegaly and small bilateral pleural effusions may represent mild CHF. 3. Bilateral patchy ground-glass opacity most concerning for multilobar pneumonia. Clinical correlation and follow-up to resolution recommended. 4. Mildly enlarged right hilar lymph node, likely reactive. 5. Aortic Atherosclerosis (ICD10-I70.0). Electronically Signed   By: Anner Crete M.D.   On: 05/31/2020 21:17   UE VENOUS DUPLEX ([place] [facility] 7 am - 7 pm)  Result Date: 05/31/2020 UPPER VENOUS STUDY  Indications: Swelling, and Edema Comparison Study: no prior Performing Technologist: Abram Sander RVS  Examination Guidelines: A complete evaluation includes B-mode imaging, spectral Doppler, color Doppler, and power Doppler as needed of all accessible portions of each vessel. Bilateral testing is  considered an integral part of a complete examination. Limited examinations for reoccurring indications may be performed as noted.  Left Findings: +----------+------------+---------+-----------+----------+-------+ LEFT      CompressiblePhasicitySpontaneousPropertiesSummary +----------+------------+---------+-----------+----------+-------+ IJV           Full       Yes       Yes                      +----------+------------+---------+-----------+----------+-------+ Subclavian    Full       Yes       Yes                      +----------+------------+---------+-----------+----------+-------+ Axillary      Full       Yes       Yes                      +----------+------------+---------+-----------+----------+-------+ Brachial      Full       Yes       Yes                      +----------+------------+---------+-----------+----------+-------+ Radial        Full                                          +----------+------------+---------+-----------+----------+-------+ Ulnar         Full                                          +----------+------------+---------+-----------+----------+-------+  Cephalic      Full                                          +----------+------------+---------+-----------+----------+-------+ Basilic       Full                                          +----------+------------+---------+-----------+----------+-------+  Summary:  Left: No evidence of deep vein thrombosis in the upper extremity. No evidence of superficial vein thrombosis in the upper extremity. No evidence of thrombosis in the subclavian.  *See table(s) above for measurements and observations.  Diagnosing physician: Harold Barban MD Electronically signed by Harold Barban MD on 05/31/2020 at 9:29:04 PM.    Final         Scheduled Meds: . enoxaparin (LOVENOX) injection  40 mg Subcutaneous Q24H   Continuous Infusions: . ceFEPime (MAXIPIME) IV 2 g (06/01/20 0759)  .  lactated ringers Stopped (05/31/20 2318)  . lactated ringers 150 mL/hr at 05/31/20 2318  . lactated ringers 125 mL/hr at 06/01/20 0806  . [START ON 06/02/2020] vancomycin       Time spent: 27 minutes with over 50% of the time coordinating the patient's care    Harold Hedge, DO [facility]   Call night coverage person covering after 7pm

## 2020-06-01 NOTE — Plan of Care (Signed)
Plan of care initiated and discussed with the patient. 

## 2020-06-01 NOTE — Progress Notes (Addendum)
Patient ID: Jacob Mitchell, male   DOB: May 27, 1945, 75 y.o.   MRN: [id number] Pt's LUE PICC removed per order of primary care team (originally placed by IV team on 05/02/20); length 48 cm; no immediate complications; gauze dressing applied to site.

## 2020-06-01 NOTE — Progress Notes (Signed)
Responded to consult for PICC line removal. PICC pulled out to 10cm and resistance met. Attempted heat and repositioning of pt and arm without success. Notified RN who messaged MD for next steps; RN to consult if any additional needs. 

## 2020-06-01 NOTE — Progress Notes (Signed)
Patient ID: Jacob Mitchell, male   DOB: 04-Mar-1945, 75 y.o.   MRN: [id number] Very pleasant 75 yo male well known to me for his left hip  Currently on Daptomycin for treatment and prophylaxis of left hip replacement   Recurrent left hip seroma followed in office  Plan: IV antibiotics and course per ID including decision on replacing a PICC line If no IV antibiotics then I plan on suppressing him with Doxy for 6 months or greater.  Giving some thought to IR placing a drain in his left hip to prevent need for recurring office visits to aspirate hip  Thank you for taking care of Jacob Mitchell

## 2020-06-02 ENCOUNTER — Inpatient Hospital Stay (HOSPITAL_COMMUNITY): Payer: Medicare Other

## 2020-06-02 DIAGNOSIS — A419 Sepsis, unspecified organism: Secondary | ICD-10-CM | POA: Diagnosis not present

## 2020-06-02 DIAGNOSIS — J189 Pneumonia, unspecified organism: Secondary | ICD-10-CM | POA: Diagnosis not present

## 2020-06-02 DIAGNOSIS — L03114 Cellulitis of left upper limb: Secondary | ICD-10-CM | POA: Diagnosis not present

## 2020-06-02 DIAGNOSIS — Z96642 Presence of left artificial hip joint: Secondary | ICD-10-CM | POA: Diagnosis not present

## 2020-06-02 LAB — BASIC METABOLIC PANEL
Anion gap: 13 (ref 5–15)
BUN: 38 mg/dL — ABNORMAL HIGH (ref 8–23)
CO2: 21 mmol/L — ABNORMAL LOW (ref 22–32)
Calcium: 8.4 mg/dL — ABNORMAL LOW (ref 8.9–10.3)
Chloride: 106 mmol/L (ref 98–111)
Creatinine, Ser: 1.45 mg/dL — ABNORMAL HIGH (ref 0.61–1.24)
GFR, Estimated: 50 mL/min — ABNORMAL LOW (ref >60)
Glucose, Bld: 107 mg/dL — ABNORMAL HIGH (ref 70–99)
Potassium: 4.4 mmol/L (ref 3.5–5.1)
Sodium: 140 mmol/L (ref 135–145)

## 2020-06-02 LAB — DIFFERENTIAL
Abs Immature Granulocytes: 0.28 10*3/uL — ABNORMAL HIGH (ref 0.00–0.07)
Basophils Absolute: 0.1 10*3/uL (ref 0.0–0.1)
Basophils Relative: 0 %
Eosinophils Absolute: 0 10*3/uL (ref 0.0–0.5)
Eosinophils Relative: 0 %
Immature Granulocytes: 1 %
Lymphocytes Relative: 4 %
Lymphs Abs: 0.8 10*3/uL (ref 0.7–4.0)
Monocytes Absolute: 1.4 10*3/uL — ABNORMAL HIGH (ref 0.1–1.0)
Monocytes Relative: 6 %
Neutro Abs: 19 10*3/uL — ABNORMAL HIGH (ref 1.7–7.7)
Neutrophils Relative %: 89 %

## 2020-06-02 LAB — BLOOD GAS, ARTERIAL
Acid-base deficit: 3.7 mmol/L — ABNORMAL HIGH (ref 0.0–2.0)
Bicarbonate: 20.3 mmol/L (ref 20.0–28.0)
O2 Saturation: 94.2 %
Patient temperature: 98.6
pCO2 arterial: 34.8 mmHg (ref 32.0–48.0)
pH, Arterial: 7.383 (ref 7.350–7.450)
pO2, Arterial: 75.8 mmHg — ABNORMAL LOW (ref 83.0–108.0)

## 2020-06-02 LAB — CBC
HCT: 30.1 % — ABNORMAL LOW (ref 39.0–52.0)
Hemoglobin: 8.9 g/dL — ABNORMAL LOW (ref 13.0–17.0)
MCH: 24.5 pg — ABNORMAL LOW (ref 26.0–34.0)
MCHC: 29.6 g/dL — ABNORMAL LOW (ref 30.0–36.0)
MCV: 82.9 fL (ref 80.0–100.0)
Platelets: 409 10*3/uL — ABNORMAL HIGH (ref 150–400)
RBC: 3.63 MIL/uL — ABNORMAL LOW (ref 4.22–5.81)
RDW: 18.1 % — ABNORMAL HIGH (ref 11.5–15.5)
WBC: 21.3 10*3/uL — ABNORMAL HIGH (ref 4.0–10.5)
nRBC: 0 % (ref 0.0–0.2)

## 2020-06-02 MED ORDER — SODIUM CHLORIDE 0.9 % IV SOLN: INTRAVENOUS | Status: AC

## 2020-06-02 MED ORDER — CHLORHEXIDINE GLUCONATE CLOTH 2 % EX PADS
6.0000 | MEDICATED_PAD | Freq: Every day | CUTANEOUS | Status: DC
  Administered 2020-06-02 – 2020-06-12 (×10): 6 via TOPICAL

## 2020-06-02 MED ORDER — DM-GUAIFENESIN ER 30-600 MG PO TB12
1.0000 | ORAL_TABLET | Freq: Two times a day (BID) | ORAL | Status: DC
  Administered 2020-06-02 – 2020-06-07 (×9): 1 via ORAL
  Filled 2020-06-02 (×9): qty 1

## 2020-06-02 MED ORDER — PHENOL 1.4 % MT LIQD
1.0000 | OROMUCOSAL | Status: DC | PRN
  Administered 2020-06-02 – 2020-06-08 (×3): 1 via OROMUCOSAL
  Filled 2020-06-02 (×3): qty 177

## 2020-06-02 MED ORDER — LORAZEPAM 2 MG/ML IJ SOLN
0.5000 mg | Freq: Once | INTRAMUSCULAR | Status: AC
  Administered 2020-06-02: 0.5 mg via INTRAVENOUS
  Filled 2020-06-02: qty 1

## 2020-06-02 MED ORDER — FUROSEMIDE 10 MG/ML IJ SOLN
40.0000 mg | Freq: Once | INTRAMUSCULAR | Status: AC
  Administered 2020-06-02: 40 mg via INTRAVENOUS
  Filled 2020-06-02: qty 4

## 2020-06-02 MED ORDER — AZITHROMYCIN 250 MG PO TABS
500.0000 mg | ORAL_TABLET | Freq: Every day | ORAL | Status: AC
  Administered 2020-06-02: 500 mg via ORAL
  Filled 2020-06-02: qty 2

## 2020-06-02 MED ORDER — AZITHROMYCIN 250 MG PO TABS: 250.0000 mg | ORAL_TABLET | Freq: Every day | ORAL | Status: DC

## 2020-06-02 MED ORDER — BENZONATATE 100 MG PO CAPS
200.0000 mg | ORAL_CAPSULE | Freq: Three times a day (TID) | ORAL | Status: DC | PRN
  Administered 2020-06-02 – 2020-06-06 (×6): 200 mg via ORAL
  Filled 2020-06-02 (×6): qty 2

## 2020-06-02 MED ORDER — MAGIC MOUTHWASH W/LIDOCAINE
10.0000 mL | Freq: Once | ORAL | Status: AC
  Administered 2020-06-02: 10 mL via ORAL
  Filled 2020-06-02 (×2): qty 10

## 2020-06-02 NOTE — Progress Notes (Signed)
PROGRESS NOTE    Jacob Mitchell    Code Status: Full Code  XBM:[id number] DOB: 1945-06-30 DOA: 05/31/2020 LOS: 2 days  PCP: [name], NP CC: No chief complaint on file.      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  ED Course: Temperature is 98.5, blood pressure 141/115, pulse 100 respiratory 24 oxygen sat 91% on room air.  White count is 13.1 hemoglobin 9.1 platelet count of 387.  Chemistry showed BUN 27 creatinine 1.46 calcium 8.3.  Glucose 110 and lactic acid 2.0.  Urinalysis essentially negative.  Respiratory panel is currently pending.  LUE [place] negative for DVT. CTA chest showed no PE.  There is mild cardiomegaly and small bilateral pleural effusions.  There is bilateral patchy groundglass opacity consistent with multilobar pneumonia.  ID and orthopedic surgery were consulted.     A & P   Principal Problem:   Sepsis ([place]) Active Problems:   S/P left THA, AA   S/P revision of total hip   Obese   HCAP (healthcare-associated pneumonia)   Cellulitis   1. Severe Sepsis without septic shock, multifactorial: HAP and LUE Cellulitis a. Sepsis criteria: tachycardia, tachypnea, leukocytosis, lactic acidosis, new O2 requirement b. WBC 12->21 today with increased O2 requirement (3->5L/min) but improved  c. CTA chest concerning for multifocal pneumonia despite daptomycin/ceftriaxone d. Respiratory viral panel negative e. ID: Continue cefepime and vancomycin, remove PICC line, follow up blood cultures, sputum culture if producing sputum. 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 Will add on azithromycin for atypical coverage  2. LUE PICC line associated infection with edema a. Improved swelling and discomfort b. Negative LUE [place] for DVT c. Plan as above  3. 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 Has been on daptomycin/ceftriaxone via LUE PICC, 6 weeks total, planned to end 5/5,  however now with PICC line associate infection/edema b. PICC line removed by IR on 4/22 c. Currently on Vancomycin/Ceftriaxone d. ID and Ortho on board, appreciate recommendations  4. Elevated creatinine in the setting of CKD 3b a. Cr baseline 1.0-1.3, Today BUN 38 and Cr 1.45 b. Restart IV fluids for today  5. Obesity a. Body mass index is 33.8 kg/m.  b. Lifestyle modification   DVT prophylaxis: enoxaparin (LOVENOX) injection 40 mg Start: 06/01/20 1000   Family Communication: no family at bedside today  Disposition Plan: discharge pending clinical improvement and Ortho/ID sign off Status is: Inpatient  Remains inpatient appropriate because:IV treatments appropriate due to intensity of illness or inability to take PO and Inpatient level of care appropriate due to severity of illness   Dispo: The patient is from: Home              Anticipated d/c is to: Home              Patient currently is not medically stable to d/c.   Difficult to place patient No           Pressure injury documentation    None  Consultants  ID Ortho  Procedures  LUE PICC line removal   Antibiotics   Anti-infectives (From admission, onward)   Start     Dose/Rate Route Frequency Ordered Stop   06/02/20 0000  vancomycin (VANCOREADY) IVPB 1500 mg/300 mL        1,500 mg 150 mL/hr over 120 Minutes Intravenous Every 24 hours 06/01/20 0129     06/01/20 0800  ceFEPIme (MAXIPIME) 2 g in sodium chloride 0.9 % 100 mL IVPB        2 g 200 mL/hr over 30 Minutes Intravenous Every 8 hours 06/01/20 0129      06/01/20 0400  vancomycin (VANCOREADY) IVPB 1000 mg/200 mL  Status:  Discontinued        1,000 mg 200 mL/hr over 60 Minutes Intravenous  Once 06/01/20 0300 06/01/20 0307   06/01/20 0400  ceFEPIme (MAXIPIME) 2 g in sodium chloride 0.9 % 100 mL IVPB  Status:  Discontinued        2 g 200 mL/hr over 30 Minutes Intravenous  Once 06/01/20 0300 06/01/20 0307   05/31/20 2245  vancomycin (VANCOCIN) IVPB 1000 mg/200 mL premix  Status:  Discontinued        1,000 mg 200 mL/hr over 60 Minutes Intravenous  Once 05/31/20 2232 05/31/20 2235   05/31/20 2245  ceFEPIme (MAXIPIME) 2 g in sodium chloride 0.9 % 100 mL IVPB        2 g 200 mL/hr over 30 Minutes Intravenous  Once 05/31/20 2232 06/01/20 0132   05/31/20 2245  vancomycin (VANCOREADY) IVPB 2000 mg/400 mL        2,000 mg 200 mL/hr over 120 Minutes Intravenous  Once 05/31/20 2235 06/01/20 0233        Subjective   Feels much better today than yesterday and feels like he is improving overall. He is concerned about his O2 status though. He is not producing sputum. No other complaints or overnight events.  Objective   Vitals:   06/01/20 1609 06/01/20 2141 06/02/20 0625 06/02/20 0643  BP: 126/60 (!) 153/64 (!) 145/80   Pulse: 92 90 99   Resp: 20 20 20    Temp: 99.3 F (37.4 C) 99.2 F (37.3 C) 99.2 F (37.3 C)   TempSrc: Oral Oral    SpO2: 90% 90% 90% 90%  Weight:      Height:        Intake/Output Summary (Last 24 hours) at 06/02/2020 0938 Last data filed at 06/02/2020 0600 Gross per 24 hour  Intake 1318.56 ml  Output 950 ml  Net 368.56 ml   Filed Weights   05/31/20 1741  Weight: 136.1 kg    Examination:  Physical Exam Vitals and nursing note reviewed.  Constitutional:      Appearance: Normal appearance.  HENT:     Head: Normocephalic and atraumatic.  Eyes:     Conjunctiva/sclera: Conjunctivae normal.  Cardiovascular:     Rate and Rhythm: Normal rate and regular rhythm.  Pulmonary:     Effort: Pulmonary effort is normal.      Breath sounds: Normal breath sounds. No wheezing or rales.  Abdominal:     General: Abdomen is flat.     Palpations: Abdomen is soft.  Musculoskeletal:     Comments: LUE pitting edema without erythema, improved from prior PICC line absent  Skin:    Coloration: Skin is not jaundiced or pale.  Neurological:     Mental Status: He is alert. Mental status is at baseline.  Psychiatric:        Mood  and Affect: Mood normal.        Behavior: Behavior normal.     Data Reviewed: I have personally reviewed following labs and imaging studies  CBC: Recent Labs  Lab 05/31/20 1742 06/01/20 0321 06/02/20 0607  WBC 13.1* 12.1* 21.3*  NEUTROABS 10.9*  --   --   HGB 9.0* 8.3* 8.9*  HCT 30.2* 28.2* 30.1*  MCV 82.3 83.2 82.9  PLT 387 346 [id number]*   Basic Metabolic Panel: Recent Labs  Lab 05/31/20 1742 06/01/20 0321 06/02/20 0607  NA 143 136 140  K 4.0 3.9 4.4  CL 107 105 106  CO2 24 22 21*  GLUCOSE 110* 109* 107*  BUN 27* 30* 38*  CREATININE 1.46* 1.35* 1.45*  CALCIUM 8.3* 8.1* 8.4*   GFR: Estimated Creatinine Clearance: 67.9 mL/min (A) (by C-G formula based on SCr of 1.45 mg/dL (H)). Liver Function Tests: Recent Labs  Lab 05/31/20 2026 06/01/20 0321  AST 26 23  ALT 20 18  ALKPHOS 126 107  BILITOT 0.5 0.5  PROT 7.0 6.0*  ALBUMIN 3.1* 2.8*   Recent Labs  Lab 05/31/20 2026  LIPASE 23   No results for input(s): AMMONIA in the last 168 hours. Coagulation Profile: Recent Labs  Lab 05/31/20 2026 06/01/20 0321  INR 1.2 1.3*   Cardiac Enzymes: No results for input(s): CKTOTAL, CKMB, CKMBINDEX, TROPONINI in the last 168 hours. BNP (last 3 results) No results for input(s): PROBNP in the last 8760 hours. HbA1C: No results for input(s): HGBA1C in the last 72 hours. CBG: No results for input(s): GLUCAP in the last 168 hours. Lipid Profile: No results for input(s): CHOL, HDL, LDLCALC, TRIG, CHOLHDL, LDLDIRECT in the last 72 hours. Thyroid Function Tests: No results  for input(s): TSH, T4TOTAL, FREET4, T3FREE, THYROIDAB in the last 72 hours. Anemia Panel: No results for input(s): VITAMINB12, FOLATE, FERRITIN, TIBC, IRON, RETICCTPCT in the last 72 hours. Sepsis Labs: Recent Labs  Lab 05/31/20 2026 05/31/20 2230 06/01/20 0321  PROCALCITON  --   --  0.43  LATICACIDVEN 2.0* 1.5  --     Recent Results (from the past 240 hour(s))  Culture, blood (routine x 2)     Status: None (Preliminary result)   Collection Time: 05/31/20  7:58 PM   Specimen: BLOOD RIGHT HAND  Result Value Ref Range Status   Specimen Description   Final    BLOOD RIGHT HAND Performed at [facility], [place] [address], [place], [place] [address]    Special Requests   Final    BOTTLES DRAWN AEROBIC AND ANAEROBIC Blood Culture adequate volume Performed at [place] [address], [place], [place] [address]    Culture   Final    NO GROWTH 1 DAY Performed at [facility], [place] [address] [place], [place] [address]    Report Status PENDING  Incomplete  Culture, blood (Routine X 2) w Reflex to ID Panel     Status: None (Preliminary result)   Collection Time: 05/31/20  8:16 PM   Specimen: BLOOD  Result Value Ref Range Status   Specimen Description   Final    BLOOD RIGHT ANTECUBITAL Performed at [place] [address], [place], [place] [address]    Special Requests   Final    BOTTLES DRAWN AEROBIC AND ANAEROBIC Blood Culture results may not be optimal due to an excessive volume of blood received in culture bottles Performed at [facility] [address] [place],  [place] [address]    Culture   Final    NO GROWTH 1 DAY Performed at [facility], [place] [address] [place], [place] [address]    Report Status PENDING  Incomplete  Culture, blood (routine x 2)     Status: None (Preliminary result)   Collection Time: 05/31/20  8:26 PM   Specimen: BLOOD RIGHT HAND  Result Value Ref Range  Status   Specimen Description   Final    BLOOD RIGHT HAND Performed at [facility] [address], [place], [place] [address]    Special Requests   Final    BOTTLES DRAWN AEROBIC ONLY Blood Culture results may not be optimal due to an excessive volume of blood received in culture bottles Performed at [place] [address] [place], [place] [address]    Culture   Final    NO GROWTH 1 DAY Performed at [facility], [place] [address], [place], [place] [address]    Report Status PENDING  Incomplete  Respiratory (~20 pathogens) panel by PCR     Status: None   Collection Time: 05/31/20 10:02 PM   Specimen: Nasopharyngeal Swab; Respiratory  Result Value Ref Range Status   Adenovirus NOT DETECTED NOT DETECTED Final   Coronavirus 229E NOT DETECTED NOT DETECTED Final    Comment: (NOTE) The Coronavirus on the Respiratory Panel, DOES NOT test for the novel  Coronavirus (2019 nCoV)    Coronavirus HKU1 NOT DETECTED NOT DETECTED Final   Coronavirus NL63 NOT DETECTED NOT DETECTED Final   Coronavirus OC43 NOT DETECTED NOT DETECTED Final   Metapneumovirus NOT DETECTED NOT DETECTED Final   Rhinovirus / Enterovirus NOT DETECTED NOT DETECTED Final   Influenza A NOT DETECTED NOT DETECTED Final   Influenza B NOT DETECTED NOT DETECTED Final   Parainfluenza Virus 1 NOT DETECTED NOT DETECTED Final   Parainfluenza Virus 2 NOT DETECTED NOT DETECTED Final   Parainfluenza Virus 3 NOT DETECTED NOT DETECTED Final   Parainfluenza Virus 4 NOT DETECTED NOT DETECTED Final   Respiratory Syncytial Virus NOT DETECTED NOT DETECTED Final   Bordetella pertussis NOT DETECTED NOT DETECTED Final   Bordetella Parapertussis NOT DETECTED NOT DETECTED Final   Chlamydophila pneumoniae NOT DETECTED NOT DETECTED Final   Mycoplasma pneumoniae NOT DETECTED NOT DETECTED Final    Comment: Performed at [facility], [place]. [address], [place], [place] [address]          Radiology Studies: CT Angio Chest PE W/Cm &/Or Wo Cm  Result Date: 05/31/2020 CLINICAL DATA:  75 year old male with left upper extremity swelling. Concern for pulmonary embolism. EXAM: CT ANGIOGRAPHY CHEST WITH CONTRAST TECHNIQUE: Multidetector CT imaging of the chest was performed using the standard protocol during bolus administration of intravenous contrast. Multiplanar CT image reconstructions and MIPs were obtained to evaluate the vascular anatomy. CONTRAST:  122mL OMNIPAQUE IOHEXOL 350 MG/ML SOLN COMPARISON:  None. FINDINGS: Evaluation of this exam is limited due to respiratory motion artifact. Cardiovascular: Mild cardiomegaly. No pericardial effusion. There is coronary vascular calcification. The thoracic aorta is unremarkable. The origins of the great vessels of the aortic arch appear patent. Evaluation of the pulmonary arteries is very limited due to respiratory motion artifact and suboptimal visualization of the peripheral branches. No large central pulmonary artery embolus identified. Mediastinum/Nodes: Mildly enlarged right hilar lymph node measures 13 mm in short axis. The esophagus is grossly unremarkable. No mediastinal fluid collection. Lungs/Pleura: Small bilateral pleural effusions. There is mild diffuse  interstitial and interlobular septal thickening, likely mild edema. Bilateral patchy ground-glass opacity most concerning for multilobar pneumonia. Clinical correlation and follow-up to resolution recommended. No pneumothorax. The central airways are patent. Upper Abdomen: Partially visualized bilateral renal cysts measure up to 7.5 cm on the left. Musculoskeletal: Degenerative changes of the spine. No acute osseous pathology. Review of the MIP images confirms the above findings. IMPRESSION: 1. No CT evidence of central pulmonary artery embolus. 2. Mild cardiomegaly and small bilateral pleural effusions may represent mild CHF. 3. Bilateral patchy ground-glass opacity most concerning  for multilobar pneumonia. Clinical correlation and follow-up to resolution recommended. 4. Mildly enlarged right hilar lymph node, likely reactive. 5. Aortic Atherosclerosis (ICD10-I70.0). Electronically Signed   By: Anner Crete M.D.   On: 05/31/2020 21:17   UE VENOUS DUPLEX ([facility] 7 am - 7 pm)  Result Date: 05/31/2020 UPPER VENOUS STUDY  Indications: Swelling, and Edema Comparison Study: no prior Performing Technologist: Abram Sander RVS  Examination Guidelines: A complete evaluation includes B-mode imaging, spectral Doppler, color Doppler, and power Doppler as needed of all accessible portions of each vessel. Bilateral testing is considered an integral part of a complete examination. Limited examinations for reoccurring indications may be performed as noted.  Left Findings: +----------+------------+---------+-----------+----------+-------+ LEFT      CompressiblePhasicitySpontaneousPropertiesSummary +----------+------------+---------+-----------+----------+-------+ IJV           Full       Yes       Yes                      +----------+------------+---------+-----------+----------+-------+ Subclavian    Full       Yes       Yes                      +----------+------------+---------+-----------+----------+-------+ Axillary      Full       Yes       Yes                      +----------+------------+---------+-----------+----------+-------+ Brachial      Full       Yes       Yes                      +----------+------------+---------+-----------+----------+-------+ Radial        Full                                          +----------+------------+---------+-----------+----------+-------+ Ulnar         Full                                          +----------+------------+---------+-----------+----------+-------+ Cephalic      Full                                          +----------+------------+---------+-----------+----------+-------+ Basilic        Full                                          +----------+------------+---------+-----------+----------+-------+  Summary:  Left: No evidence of deep vein thrombosis in the upper extremity. No evidence of superficial vein thrombosis in the upper extremity. No evidence of thrombosis in the subclavian.  *See table(s) above for measurements and observations.  Diagnosing physician: Harold Barban MD Electronically signed by Harold Barban MD on 05/31/2020 at 9:29:04 PM.    Final         Scheduled Meds: . enoxaparin (LOVENOX) injection  40 mg Subcutaneous Q24H   Continuous Infusions: . sodium chloride 100 mL/hr at 06/02/20 0803  . ceFEPime (MAXIPIME) IV 2 g (06/02/20 0804)  . vancomycin 1,500 mg (06/01/20 2352)     Time spent: 28 minutes with over 50% of the time coordinating the patient's care    Harold Hedge, DO [facility]   Call night coverage person covering after 7pm

## 2020-06-02 NOTE — Plan of Care (Signed)
Plan of care reviewed and discussed with the patient. 

## 2020-06-02 NOTE — Plan of Care (Signed)
  Problem: Education: Goal: Knowledge of General Education information will improve Description: Including pain rating scale, medication(s)/side effects and non-pharmacologic comfort measures Outcome: Progressing   Problem: Activity: Goal: Risk for activity intolerance will decrease Outcome: Progressing   Problem: Clinical Measurements: Goal: Respiratory complications will improve Outcome: Progressing

## 2020-06-02 NOTE — Progress Notes (Signed)
Patient is SOB o2 applied at 4liters sats were 64-75 continuous pulse applied resp paged D FranklinRN

## 2020-06-02 NOTE — Progress Notes (Signed)
Patients wife called and informed of moving to stepdown/ icu bed,1232 patient was able to speak to wife. RR RN  also spoke with wife. Bethann Punches RN

## 2020-06-02 NOTE — Evaluation (Signed)
Clinical/Bedside Swallow Evaluation Patient Details  Name: Jacob Mitchell MRN: [id number] Date of Birth: [birth year]  Today's Date: 06/02/2020 Time: SLP Start Time (ACUTE ONLY): 1640 SLP Stop Time (ACUTE ONLY): 1700 SLP Time Calculation (min) (ACUTE ONLY): 20 min  Past Medical History:  Past Medical History:  Diagnosis Date  . Benign localized prostatic hyperplasia with lower urinary tract symptoms (LUTS)   . Complication of anesthesia    pt says he gets "restless" when he is waking up and nurses have had to "hold him down"- happened several surgeries ago   . Diverticulosis of colon   . History of colon polyps   . History of kidney stones   . History of MRSA infection 2007   post right shoulder surgery  . History of urinary retention 05/31/2017   due to BPH  . Incomplete emptying of bladder   . OA (osteoarthritis)   . Peripheral neuropathy    feet-- hx frost bite  . Pinched nerve in neck    right index and middle fingers numbness  . Wears glasses   . Wears hearing aid in both ears    Past Surgical History:  Past Surgical History:  Procedure Laterality Date  . CATARACT EXTRACTION Left 01/13/2020  . COLONOSCOPY    . CYST REMOVAL LEG Right 1975  . CYSTOSCOPY WITH INSERTION OF UROLIFT N/A 07/20/2017   Procedure: CYSTOSCOPY WITH INSERTION OF UROLIFT;  Surgeon: Cleon Gustin, MD;  Location: [facility];  Service: Urology;  Laterality: N/A;  . CYSTOSCOPY WITH INSERTION OF UROLIFT    . DISTAL CLAVICLE EXCISION Left 1977   left shoulder  . ELBOW SURGERY Right 1969;  1980  . HAND SURGERY Right 1968  . INCISION AND DRAINAGE HIP Left 05/01/2020   Procedure: IRRIGATION AND DEBRIDEMENT LEFT HIP SEROMA;  Surgeon: Paralee Cancel, MD;  Location: [facility];  Service: Orthopedics;  Laterality: Left;  90 mins  . LAMINECTOMY  1970   L4-5  . LUMBAR FUSION  2013  approx.   L1--L5  . ORIF HIP FRACTURE Left 01/17/2020   Procedure: OPEN REDUCTION INTERNAL FIXATION LEFT HIP GREATER  TROCHANTER FRACTURE;  Surgeon: Paralee Cancel, MD;  Location: [facility];  Service: Orthopedics;  Laterality: Left;  90 MINS  . ORIF PERIPROSTHETIC FRACTURE Left 02/27/2020   Procedure: OPEN REDUCTION INTERNAL FIXATION (ORIF) LEFT PERI-PROSTETIC  HIP FRACTURE;  Surgeon: Paralee Cancel, MD;  Location: [facility];  Service: Orthopedics;  Laterality: Left;  58mins  . ROTATOR CUFF REPAIR Right x5    last one 2007   5 surgeries in 9 days--- original repair then 4 sx'  I&D sx's for MRSA infection  . SHOULDER ARTHROSCOPY WITH DISTAL CLAVICLE RESECTION Left 1977  . SHOULDER SURGERY Left 1976  . TOTAL HIP ARTHROPLASTY Left 11/23/2015   Procedure: LEFT TOTAL HIP ARTHROPLASTY ANTERIOR APPROACH;  Surgeon: Paralee Cancel, MD;  Location: [facility];  Service: Orthopedics;  Laterality: Left;  . TOTAL HIP REVISION     10/14/16 Dr. Alvan Dame  . TOTAL HIP REVISION Left 10/14/2016   Procedure: TOTAL HIP REVISION POSTERIOR APPROACH;  Surgeon: Paralee Cancel, MD;  Location: [facility];  Service: Orthopedics;  Laterality: Left; (total of 4 hip surgeries in all)   HPI:  Patient is a 75  y.o. 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 He presented to ED on 4/21 with LUE swelling and pain  aroudn PICC line site, associated SOB and coughing x1 week. In ED he was afebrile, oxygen saturations 91% on room air, urinalysis negative, CXR revealed bilateral patchy groundglass opacity consistent with multilobar PNA.   Assessment / Plan / Recommendation Clinical Impression  Patient presents with an oropharyngeal swallow that appear to be [facility] and without overt s/s aspiration or penetration. He does present with a persistent dry unproductive cough which occured prior to, during and after PO intake. Patient did show SLP some material he had coughed out of throat which was in a culture cup to be  assessed. Patient denies any h/o dysphagia or reflux and reported that he has had a PNA in the past but not accompanied with the persistent cough as he has now. As MD would like to determine if patient is aspirating, SLP plans to f/u with patient next 1-2 dates to determine if need for objective swallow study to r/o silent aspiration. SLP Visit Diagnosis: Dysphagia, unspecified (R13.10)    Aspiration Risk  No limitations;Mild aspiration risk    Diet Recommendation Regular;Thin liquid   Liquid Administration via: Cup;Straw Medication Administration: Whole meds with liquid Supervision: Patient able to self feed Postural Changes: Seated upright at 90 degrees    Other  Recommendations Oral Care Recommendations: Oral care BID   Follow up Recommendations None      Frequency and Duration min 1 x/week  1 week       Prognosis Prognosis for Safe Diet Advancement: Good      Swallow Study   General Date of Onset: 05/31/20 HPI: Patient is a 75  y.o. 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 He presented to ED on 4/21 with LUE swelling and pain aroudn PICC line site, associated SOB and coughing x1 week. In ED he was afebrile, oxygen saturations 91% on room air, urinalysis negative, CXR revealed bilateral patchy groundglass opacity consistent with multilobar PNA. Type of Study: Bedside Swallow Evaluation Previous Swallow Assessment: None found Diet Prior to this Study: Regular;Thin liquids Temperature Spikes Noted: No Respiratory Status: Room air;Nasal cannula History of Recent Intubation: No Behavior/Cognition: Alert;Cooperative;Pleasant mood Oral Care Completed by SLP: No Oral Cavity - Dentition: Adequate natural dentition Vision: Functional for self-feeding Self-Feeding Abilities: Able to feed self Patient Positioning:  Upright in bed Baseline Vocal Quality: Normal;Other (comment) (mild hoarseness likely from coughing) Volitional Cough: Strong Volitional Swallow: Able to elicit    Oral/Motor/Sensory Function Overall Oral Motor/Sensory Function: Within functional limits   Ice Chips     Thin Liquid Thin Liquid: Within functional limits Presentation: Self Fed;Straw    Nectar Thick     Honey Thick     Puree Puree: Not tested   Solid     Solid: Not tested      Sonia Baller, MA, CCC-SLP Speech Therapy

## 2020-06-02 NOTE — Progress Notes (Signed)
Patient sats 65 75  resp paged for a breathing tx pt is coughing nonstop.  Rapid response called for support D Mateo Flow RN

## 2020-06-03 ENCOUNTER — Inpatient Hospital Stay (HOSPITAL_COMMUNITY): Payer: Medicare Other

## 2020-06-03 DIAGNOSIS — N189 Chronic kidney disease, unspecified: Secondary | ICD-10-CM | POA: Diagnosis not present

## 2020-06-03 DIAGNOSIS — J9601 Acute respiratory failure with hypoxia: Secondary | ICD-10-CM

## 2020-06-03 DIAGNOSIS — J9600 Acute respiratory failure, unspecified whether with hypoxia or hypercapnia: Secondary | ICD-10-CM

## 2020-06-03 DIAGNOSIS — J189 Pneumonia, unspecified organism: Secondary | ICD-10-CM | POA: Diagnosis not present

## 2020-06-03 DIAGNOSIS — L03114 Cellulitis of left upper limb: Secondary | ICD-10-CM | POA: Diagnosis not present

## 2020-06-03 DIAGNOSIS — R6520 Severe sepsis without septic shock: Secondary | ICD-10-CM | POA: Diagnosis not present

## 2020-06-03 DIAGNOSIS — Z8616 Personal history of COVID-19: Secondary | ICD-10-CM

## 2020-06-03 DIAGNOSIS — T80212A Local infection due to central venous catheter, initial encounter: Secondary | ICD-10-CM | POA: Diagnosis not present

## 2020-06-03 DIAGNOSIS — A419 Sepsis, unspecified organism: Secondary | ICD-10-CM | POA: Diagnosis not present

## 2020-06-03 DIAGNOSIS — T8452XA Infection and inflammatory reaction due to internal left hip prosthesis, initial encounter: Secondary | ICD-10-CM | POA: Diagnosis not present

## 2020-06-03 DIAGNOSIS — R0603 Acute respiratory distress: Secondary | ICD-10-CM | POA: Diagnosis not present

## 2020-06-03 LAB — HIV ANTIBODY (ROUTINE TESTING W REFLEX): HIV Screen 4th Generation wRfx: NONREACTIVE

## 2020-06-03 LAB — BLOOD GAS, ARTERIAL
Acid-base deficit: 2.1 mmol/L — ABNORMAL HIGH (ref 0.0–2.0)
Bicarbonate: 22.5 mmol/L (ref 20.0–28.0)
FIO2: 100
O2 Saturation: 92.7 %
Patient temperature: 98.6
pCO2 arterial: 40.1 mmHg (ref 32.0–48.0)
pH, Arterial: 7.367 (ref 7.350–7.450)
pO2, Arterial: 70.1 mmHg — ABNORMAL LOW (ref 83.0–108.0)

## 2020-06-03 LAB — BASIC METABOLIC PANEL
Anion gap: 10 (ref 5–15)
BUN: 45 mg/dL — ABNORMAL HIGH (ref 8–23)
CO2: 23 mmol/L (ref 22–32)
Calcium: 8.2 mg/dL — ABNORMAL LOW (ref 8.9–10.3)
Chloride: 104 mmol/L (ref 98–111)
Creatinine, Ser: 1.36 mg/dL — ABNORMAL HIGH (ref 0.61–1.24)
GFR, Estimated: 54 mL/min — ABNORMAL LOW (ref >60)
Glucose, Bld: 129 mg/dL — ABNORMAL HIGH (ref 70–99)
Potassium: 4 mmol/L (ref 3.5–5.1)
Sodium: 137 mmol/L (ref 135–145)

## 2020-06-03 LAB — CBC
HCT: 29.7 % — ABNORMAL LOW (ref 39.0–52.0)
Hemoglobin: 8.7 g/dL — ABNORMAL LOW (ref 13.0–17.0)
MCH: 24.2 pg — ABNORMAL LOW (ref 26.0–34.0)
MCHC: 29.3 g/dL — ABNORMAL LOW (ref 30.0–36.0)
MCV: 82.5 fL (ref 80.0–100.0)
Platelets: 355 10*3/uL (ref 150–400)
RBC: 3.6 MIL/uL — ABNORMAL LOW (ref 4.22–5.81)
RDW: 18.1 % — ABNORMAL HIGH (ref 11.5–15.5)
WBC: 20.2 10*3/uL — ABNORMAL HIGH (ref 4.0–10.5)
nRBC: 0 % (ref 0.0–0.2)

## 2020-06-03 LAB — CBC WITH DIFFERENTIAL/PLATELET
Abs Immature Granulocytes: 0.22 10*3/uL — ABNORMAL HIGH (ref 0.00–0.07)
Basophils Absolute: 0.1 10*3/uL (ref 0.0–0.1)
Basophils Relative: 0 %
Eosinophils Absolute: 0.1 10*3/uL (ref 0.0–0.5)
Eosinophils Relative: 1 %
HCT: 29.4 % — ABNORMAL LOW (ref 39.0–52.0)
Hemoglobin: 8.7 g/dL — ABNORMAL LOW (ref 13.0–17.0)
Immature Granulocytes: 1 %
Lymphocytes Relative: 3 %
Lymphs Abs: 0.5 10*3/uL — ABNORMAL LOW (ref 0.7–4.0)
MCH: 24.4 pg — ABNORMAL LOW (ref 26.0–34.0)
MCHC: 29.6 g/dL — ABNORMAL LOW (ref 30.0–36.0)
MCV: 82.6 fL (ref 80.0–100.0)
Monocytes Absolute: 1 10*3/uL (ref 0.1–1.0)
Monocytes Relative: 5 %
Neutro Abs: 16.4 10*3/uL — ABNORMAL HIGH (ref 1.7–7.7)
Neutrophils Relative %: 90 %
Platelets: 322 10*3/uL (ref 150–400)
RBC: 3.56 MIL/uL — ABNORMAL LOW (ref 4.22–5.81)
RDW: 18.2 % — ABNORMAL HIGH (ref 11.5–15.5)
WBC: 18.3 10*3/uL — ABNORMAL HIGH (ref 4.0–10.5)
nRBC: 0 % (ref 0.0–0.2)

## 2020-06-03 LAB — GLUCOSE, CAPILLARY
Glucose-Capillary: 124 mg/dL — ABNORMAL HIGH (ref 70–99)
Glucose-Capillary: 123 mg/dL — ABNORMAL HIGH (ref 70–99)
Glucose-Capillary: 120 mg/dL — ABNORMAL HIGH (ref 70–99)

## 2020-06-03 LAB — STREP PNEUMONIAE URINARY ANTIGEN: Strep Pneumo Urinary Antigen: NEGATIVE

## 2020-06-03 LAB — PROCALCITONIN: Procalcitonin: 2.55 ng/mL

## 2020-06-03 LAB — MRSA PCR SCREENING: MRSA by PCR: NEGATIVE

## 2020-06-03 LAB — ECHOCARDIOGRAM COMPLETE
Area-P 1/2: 1.72 cm2
Height: 79 in
S' Lateral: 4 cm
Weight: 5269.88 oz

## 2020-06-03 LAB — SEDIMENTATION RATE: Sed Rate: 86 mm/hr — ABNORMAL HIGH (ref 0–16)

## 2020-06-03 MED ORDER — SODIUM CHLORIDE 0.9 % IV SOLN
INTRAVENOUS | Status: DC | PRN
  Administered 2020-06-03 (×3): 250 mL via INTRAVENOUS

## 2020-06-03 MED ORDER — METHYLPREDNISOLONE SODIUM SUCC 125 MG IJ SOLR: 125.0000 mg | Freq: Four times a day (QID) | INTRAMUSCULAR | Status: DC

## 2020-06-03 MED ORDER — PERFLUTREN LIPID MICROSPHERE
1.0000 mL | INTRAVENOUS | Status: AC | PRN
  Administered 2020-06-03: 2 mL via INTRAVENOUS
  Filled 2020-06-03: qty 10

## 2020-06-03 MED ORDER — METHYLPREDNISOLONE SODIUM SUCC 125 MG IJ SOLR
125.0000 mg | Freq: Four times a day (QID) | INTRAMUSCULAR | Status: DC
  Administered 2020-06-03 – 2020-06-06 (×12): 125 mg via INTRAVENOUS
  Filled 2020-06-03 (×11): qty 2

## 2020-06-03 MED ORDER — VANCOMYCIN HCL 1000 MG/200ML IV SOLN
1000.0000 mg | Freq: Two times a day (BID) | INTRAVENOUS | Status: AC
Start: 2020-06-03 — End: 2020-06-06
  Administered 2020-06-03 – 2020-06-06 (×7): 1000 mg via INTRAVENOUS
  Filled 2020-06-03 (×6): qty 200

## 2020-06-03 MED ORDER — SODIUM CHLORIDE 0.9 % IV SOLN
500.0000 mg | INTRAVENOUS | Status: DC
  Administered 2020-06-03 – 2020-06-05 (×3): 500 mg via INTRAVENOUS
  Filled 2020-06-03 (×3): qty 500

## 2020-06-03 MED ORDER — METHYLPREDNISOLONE SODIUM SUCC 125 MG IJ SOLR: 60.0000 mg | Freq: Four times a day (QID) | INTRAMUSCULAR | Status: DC

## 2020-06-03 MED ORDER — INSULIN ASPART 100 UNIT/ML ~~LOC~~ SOLN
3.0000 [IU] | SUBCUTANEOUS | Status: DC
Start: 2020-06-03 — End: 2020-06-07
  Administered 2020-06-03 – 2020-06-04 (×3): 3 [IU] via SUBCUTANEOUS
  Administered 2020-06-04: 6 [IU] via SUBCUTANEOUS
  Administered 2020-06-04 (×4): 3 [IU] via SUBCUTANEOUS
  Administered 2020-06-05: 6 [IU] via SUBCUTANEOUS
  Administered 2020-06-05: 3 [IU] via SUBCUTANEOUS
  Administered 2020-06-05: 6 [IU] via SUBCUTANEOUS
  Administered 2020-06-05 (×3): 3 [IU] via SUBCUTANEOUS
  Administered 2020-06-06 (×2): 6 [IU] via SUBCUTANEOUS
  Administered 2020-06-06 (×2): 3 [IU] via SUBCUTANEOUS
  Administered 2020-06-06 (×2): 6 [IU] via SUBCUTANEOUS
  Administered 2020-06-07 (×3): 3 [IU] via SUBCUTANEOUS

## 2020-06-03 MED ORDER — ENOXAPARIN SODIUM 80 MG/0.8ML ~~LOC~~ SOLN
75.0000 mg | SUBCUTANEOUS | Status: DC
  Administered 2020-06-03 – 2020-06-12 (×10): 75 mg via SUBCUTANEOUS
  Filled 2020-06-03: qty 0.8
  Filled 2020-06-03 (×3): qty 0.75
  Filled 2020-06-03: qty 0.8
  Filled 2020-06-03 (×3): qty 0.75
  Filled 2020-06-03: qty 0.8
  Filled 2020-06-03: qty 0.75

## 2020-06-03 NOTE — Progress Notes (Signed)
Pt transported to CT and back on BIPAP without complication.

## 2020-06-03 NOTE — Progress Notes (Signed)
PROGRESS NOTE    Jacob Mitchell    Code Status: Full Code  VQQ:[id number] DOB: [birth year] DOA: 05/31/2020 LOS: 3 days  PCP: [name], NP CC: No chief complaint on file.      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  ED Course: Temperature is 98.5, blood pressure 141/115, pulse 100 respiratory 24 oxygen sat 91% on room air.  White count is 13.1 hemoglobin 9.1 platelet count of 387.  Chemistry showed BUN 27 creatinine 1.46 calcium 8.3.  Glucose 110 and lactic acid 2.0.  Urinalysis essentially negative.  Respiratory panel is currently pending.  LUE [place] negative for DVT. CTA chest showed no PE.  There is mild cardiomegaly and small bilateral pleural effusions.  There is bilateral patchy groundglass opacity consistent with multilobar pneumonia.  ID and orthopedic surgery were consulted.   Rapid response call on evening of 4/23 for respiratory distress and hypoxic respiratory failure. He was transferred to the step down unit and required 15 L/min NRB initially. PCCM was consulted on 4/24 and started Bipap and steroids.   A & P   Principal Problem:   Sepsis ([place]) Active Problems:   S/P left THA, AA   S/P revision of total hip   Obese   HCAP (healthcare-associated pneumonia)   Cellulitis   Acute hypoxemic respiratory failure ([place])   1. Severe Sepsis without septic shock, multifactorial: LUE PICC line associated Cellulitis and possible HAP vs. 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 Sepsis criteria: tachycardia, tachypnea, leukocytosis, lactic acidosis, new O2 requirement b. Blood and sputum  Cultures NGTD c. LUE PICC removed 4/22 d. ID: Continue cefepime and vancomycin. May have eosinophilic pneumonia as he developed the respiratory symptoms while on daptomycin e. azithromycin added 4/23 for atypical coverage  2. Acute hypoxemic respiratory failure  a. Acute worsening of respiratory status overnight b. Initial CTA chest concerning for multifocal pneumonia -> worsened CT chest today c. Respiratory vial panel negative d. PCCM consulted, discussed with Dr. 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 Started on bipap, start on empiric steroids, G6PDH, quantiferon gold, ICU hyperglycemia protocol, HIV, echo, PCT, COVID IgG e. Will continue antibiotics as in #1, pending ID recommendations  3. LUE PICC line associated infection with edema, improving a. Improved swelling and discomfort b. PICC line removed 4/22 by iR c. Negative LUE [place] for DVT  4. 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 Has been on daptomycin/ceftriaxone via LUE PICC, 6 weeks total, planned to end 5/5,  however now with PICC line associate infection/edema b. PICC line removed by IR on 4/22 c. Currently on Vancomycin/Ceftriaxone d. ID and Ortho on board, appreciate recommendations  5. Elevated creatinine in the setting of CKD 3b a. Cr baseline 1.0-1.3, Today BUN 38 and Cr 1.45 b. Restart IV fluids for today  6. Obesity a. Body mass index is 37.1 kg/m.  b. Lifestyle modification   DVT prophylaxis: lovenox  Family Communication: wife at bedside  Disposition Plan: discharge pending clinical improvement and Ortho/ID/PCCM sign off Status is: Inpatient  Remains  inpatient appropriate because:IV treatments appropriate due to intensity of illness or inability to take PO and Inpatient level of care appropriate due to severity of illness   Dispo: The patient is from: Home               Anticipated d/c is to: Home              Patient currently is not medically stable to d/c.   Difficult to place patient No           Pressure injury documentation    None  Consultants  ID Ortho PCCM  Procedures  4/22 LUE PICC line removal  4/24: Bipap  Antibiotics   Anti-infectives (From admission, onward)   Start     Dose/Rate Route Frequency Ordered Stop   06/03/20 1800  vancomycin (VANCOREADY) IVPB 1000 mg/200 mL        1,000 mg 200 mL/hr over 60 Minutes Intravenous Every 12 hours 06/03/20 0838     06/03/20 1030  azithromycin (ZITHROMAX) 500 mg in sodium chloride 0.9 % 250 mL IVPB        500 mg 250 mL/hr over 60 Minutes Intravenous Every 24 hours 06/03/20 0931 06/07/20 1029   06/03/20 1000  azithromycin (ZITHROMAX) tablet 250 mg  Status:  Discontinued       "Followed by" Linked Group Details   250 mg Oral Daily 06/02/20 0942 06/03/20 0931   06/02/20 1030  azithromycin (ZITHROMAX) tablet 500 mg       "Followed by" Linked Group Details   500 mg Oral Daily 06/02/20 0942 06/02/20 1042   06/02/20 0000  vancomycin (VANCOREADY) IVPB 1500 mg/300 mL  Status:  Discontinued        1,500 mg 150 mL/hr over 120 Minutes Intravenous Every 24 hours 06/01/20 0129 06/03/20 0838   06/01/20 0800  ceFEPIme (MAXIPIME) 2 g in sodium chloride 0.9 % 100 mL IVPB        2 g 200 mL/hr over 30 Minutes Intravenous Every 8 hours 06/01/20 0129     06/01/20 0400  vancomycin (VANCOREADY) IVPB 1000 mg/200 mL  Status:  Discontinued        1,000 mg 200 mL/hr over 60 Minutes Intravenous  Once 06/01/20 0300 06/01/20 0307   06/01/20 0400  ceFEPIme (MAXIPIME) 2 g in sodium chloride 0.9 % 100 mL IVPB  Status:  Discontinued        2 g 200 mL/hr over 30 Minutes Intravenous  Once 06/01/20 0300 06/01/20 0307   05/31/20 2245  vancomycin (VANCOCIN) IVPB 1000 mg/200 mL premix  Status:  Discontinued        1,000 mg 200 mL/hr over 60 Minutes Intravenous  Once 05/31/20 2232 05/31/20 2235   05/31/20 2245   ceFEPIme (MAXIPIME) 2 g in sodium chloride 0.9 % 100 mL IVPB        2 g 200 mL/hr over 30 Minutes Intravenous  Once 05/31/20 2232 06/01/20 0132   05/31/20 2245  vancomycin (VANCOREADY) IVPB 2000 mg/400 mL        2,000 mg 200 mL/hr over 120 Minutes Intravenous  Once 05/31/20 2235 06/01/20 0233        Subjective   Patient with acutely worsened shortness of breath last night prompting rapid response and transfer to SDU on 15 L/min NRB. Currently with dry cough. States that he did not sleep well last night and feels confused. He denies any chest pain, swelling, nausea, vomiting.  Objective   Vitals:   06/03/20 0700 06/03/20 0800  06/03/20 0843 06/03/20 0900  BP: (!) 141/66 (!) 114/41 (!) 114/48 125/68  Pulse: 62 72 70 65  Resp: (!) 40 (!) 37 (!) 27 12  Temp:  98 F (36.7 C)    TempSrc:  Axillary    SpO2: 90% 93% 100% 100%  Weight:      Height:        Intake/Output Summary (Last 24 hours) at 06/03/2020 1116 Last data filed at 06/03/2020 1107 Gross per 24 hour  Intake 866.99 ml  Output 2125 ml  Net -1258.01 ml   Filed Weights   05/31/20 1741 06/02/20 2100  Weight: 136.1 kg (!) 149.4 kg    Examination:  Physical Exam Vitals and nursing note reviewed. Exam conducted with a chaperone present.  Constitutional:      General: He is not in acute distress.    Appearance: He is ill-appearing.  HENT:     Head: Normocephalic.     Mouth/Throat:     Mouth: Mucous membranes are moist.  Eyes:     Conjunctiva/sclera: Conjunctivae normal.  Cardiovascular:     Rate and Rhythm: Normal rate and regular rhythm.  Pulmonary:     Effort: No respiratory distress.     Breath sounds: Rales present. No wheezing.     Comments: Dry cough Abdominal:     General: There is no distension.     Palpations: There is no mass.  Musculoskeletal:        General: No swelling or tenderness.  Neurological:     Mental Status: He is alert. Mental status is at baseline.     Comments: Answer questions  appropriately  Psychiatric:        Mood and Affect: Mood normal.        Behavior: Behavior normal.     Data Reviewed: I have personally reviewed following labs and imaging studies  CBC: Recent Labs  Lab 05/31/20 1742 06/01/20 0321 06/02/20 0607 06/03/20 0321  WBC 13.1* 12.1* 21.3* 20.2*  NEUTROABS 10.9*  --  19.0*  --   HGB 9.0* 8.3* 8.9* 8.7*  HCT 30.2* 28.2* 30.1* 29.7*  MCV 82.3 83.2 82.9 82.5  PLT 387 346 409* 621   Basic Metabolic Panel: Recent Labs  Lab 05/31/20 1742 06/01/20 0321 06/02/20 0607 06/03/20 0321  NA 143 136 140 137  K 4.0 3.9 4.4 4.0  CL 107 105 106 104  CO2 24 22 21* 23  GLUCOSE 110* 109* 107* 129*  BUN 27* 30* 38* 45*  CREATININE 1.46* 1.35* 1.45* 1.36*  CALCIUM 8.3* 8.1* 8.4* 8.2*   GFR: Estimated Creatinine Clearance: 75.8 mL/min (A) (by C-G formula based on SCr of 1.36 mg/dL (H)). Liver Function Tests: Recent Labs  Lab 05/31/20 2026 06/01/20 0321  AST 26 23  ALT 20 18  ALKPHOS 126 107  BILITOT 0.5 0.5  PROT 7.0 6.0*  ALBUMIN 3.1* 2.8*   Recent Labs  Lab 05/31/20 2026  LIPASE 23   No results for input(s): AMMONIA in the last 168 hours. Coagulation Profile: Recent Labs  Lab 05/31/20 2026 06/01/20 0321  INR 1.2 1.3*   Cardiac Enzymes: No results for input(s): CKTOTAL, CKMB, CKMBINDEX, TROPONINI in the last 168 hours. BNP (last 3 results) No results for input(s): PROBNP in the last 8760 hours. HbA1C: No results for input(s): HGBA1C in the last 72 hours. CBG: No results for input(s): GLUCAP in the last 168 hours. Lipid Profile: No results for input(s): CHOL, HDL, LDLCALC, TRIG, CHOLHDL, LDLDIRECT in the last 72 hours. Thyroid  Function Tests: No results for input(s): TSH, T4TOTAL, FREET4, T3FREE, THYROIDAB in the last 72 hours. Anemia Panel: No results for input(s): VITAMINB12, FOLATE, FERRITIN, TIBC, IRON, RETICCTPCT in the last 72 hours. Sepsis Labs: Recent Labs  Lab 05/31/20 2026 05/31/20 2230 06/01/20 0321   PROCALCITON  --   --  0.43  LATICACIDVEN 2.0* 1.5  --     Recent Results (from the past 240 hour(s))  Culture, blood (routine x 2)     Status: None (Preliminary result)   Collection Time: 05/31/20  7:58 PM   Specimen: BLOOD RIGHT HAND  Result Value Ref Range Status   Specimen Description   Final    BLOOD RIGHT HAND Performed at [facility], [place] [address], [place], [place] [address]    Special Requests   Final    BOTTLES DRAWN AEROBIC AND ANAEROBIC Blood Culture adequate volume Performed at [facility] [address], [place], [place] [address]    Culture   Final    NO GROWTH 2 DAYS Performed at [facility] [address], [place], [place] [address]    Report Status PENDING  Incomplete  Culture, blood (Routine X 2) w Reflex to ID Panel     Status: None (Preliminary result)   Collection Time: 05/31/20  8:16 PM   Specimen: BLOOD  Result Value Ref Range Status   Specimen Description   Final    BLOOD RIGHT ANTECUBITAL Performed at [facility] [address] [place], [place] [address]    Special Requests   Final    BOTTLES DRAWN AEROBIC AND ANAEROBIC Blood Culture results may not be optimal due to an excessive volume of blood received in culture bottles Performed at [facility] [address], [place], [place] [address]    Culture   Final    NO GROWTH 2 DAYS Performed at [facility] [address], [place], [place] [address]    Report Status PENDING  Incomplete  Culture, blood (routine x 2)     Status: None (Preliminary result)   Collection Time: 05/31/20  8:26 PM   Specimen: BLOOD RIGHT HAND  Result Value Ref Range Status   Specimen Description   Final    BLOOD RIGHT HAND Performed at [facility] [address] [place], [place] [address]    Special Requests   Final    BOTTLES DRAWN AEROBIC ONLY Blood Culture results may not be optimal due to an  excessive volume of blood received in culture bottles Performed at [facility] [address] [place], [place] [address]    Culture   Final    NO GROWTH 2 DAYS Performed at [facility] [address], [place],  [address]    Report Status PENDING  Incomplete  Respiratory (~20 pathogens) panel by PCR     Status: None   Collection Time: 05/31/20 10:02 PM   Specimen: Nasopharyngeal Swab; Respiratory  Result Value Ref Range Status   Adenovirus NOT DETECTED NOT DETECTED Final   Coronavirus 229E NOT DETECTED NOT DETECTED Final    Comment: (NOTE) The Coronavirus on the Respiratory Panel, DOES NOT test for the novel  Coronavirus (2019 nCoV)    Coronavirus HKU1 NOT DETECTED NOT DETECTED Final   Coronavirus NL63 NOT DETECTED NOT DETECTED Final   Coronavirus OC43 NOT DETECTED NOT DETECTED Final   Metapneumovirus NOT DETECTED NOT DETECTED Final   Rhinovirus / Enterovirus NOT DETECTED  NOT DETECTED Final   Influenza A NOT DETECTED NOT DETECTED Final   Influenza B NOT DETECTED NOT DETECTED Final   Parainfluenza Virus 1 NOT DETECTED NOT DETECTED Final   Parainfluenza Virus 2 NOT DETECTED NOT DETECTED Final   Parainfluenza Virus 3 NOT DETECTED NOT DETECTED Final   Parainfluenza Virus 4 NOT DETECTED NOT DETECTED Final   Respiratory Syncytial Virus NOT DETECTED NOT DETECTED Final   Bordetella pertussis NOT DETECTED NOT DETECTED Final   Bordetella Parapertussis NOT DETECTED NOT DETECTED Final   Chlamydophila pneumoniae NOT DETECTED NOT DETECTED Final   Mycoplasma pneumoniae NOT DETECTED NOT DETECTED Final    Comment: Performed at [facility], [place] [address], [place], [place] [address]  Expectorated Sputum Assessment w Gram Stain, Rflx to Resp Cult     Status: None (Preliminary result)   Collection Time: 06/02/20  9:31 AM   Specimen: Sputum  Result Value Ref Range Status   Specimen Description SPUTUM  Final   Special Requests NONE  Final   Sputum  evaluation   Final    THIS SPECIMEN IS ACCEPTABLE FOR SPUTUM CULTURE Performed at [facility], [place] [address] [place], [place] [address]    Report Status PENDING  Incomplete  Culture, Respiratory w Gram Stain     Status: None (Preliminary result)   Collection Time: 06/02/20  9:31 AM   Specimen: SPU  Result Value Ref Range Status   Specimen Description   Final    SPUTUM Performed at [facility] [address], [place], [place] [address]    Special Requests   Final    NONE Reflexed from [phone] Performed at [facility], [place] [address] [place], [place] [address]    Gram Stain   Final    RARE WBC PRESENT,BOTH PMN AND MONONUCLEAR FEW YEAST FEW GRAM POSITIVE COCCI IN PAIRS RARE GRAM NEGATIVE RODS    Culture   Final    TOO YOUNG TO READ Performed at [facility], [place] [address] [place], [place] [address]    Report Status PENDING  Incomplete  MRSA PCR Screening     Status: None   Collection Time: 06/02/20  9:59 PM   Specimen: Nasal Mucosa; Nasopharyngeal  Result Value Ref Range Status   MRSA by PCR NEGATIVE NEGATIVE Final    Comment:        The GeneXpert MRSA Assay (FDA approved for NASAL specimens only), is one component of a comprehensive MRSA colonization surveillance program. It is not intended to diagnose MRSA infection nor to guide or monitor treatment for MRSA infections. Performed at [facility], [place] [address], [place], [place] [address]          Radiology Studies: CT CHEST WO CONTRAST  Result Date: 06/03/2020 CLINICAL DATA:  Follow-up pneumonia. EXAM: CT CHEST WITHOUT CONTRAST TECHNIQUE: Multidetector CT imaging of the chest was performed following the standard protocol without IV contrast. COMPARISON:  05/31/2020 FINDINGS: Cardiovascular: Heart size appears within normal limits. No pericardial effusion. Aortic atherosclerosis. Coronary artery calcifications.  Mediastinum/Nodes: Prominent mediastinal lymph nodes are identified, none of which meet CT criteria for adenopathy. This likely reflects reactive change. Thyroid gland, trachea, and esophagus demonstrate no significant findings. Lungs/Pleura: Trace pleural fluid identified overlying the posterior lung bases. Bilateral multifocal airspace and ground-glass opacities are again noted identified compatible with multifocal pneumonia. This is most severe within the posterolateral right lower lobe, anterolateral left lower lobe and periphery of the right upper lobe.  Compared with the previous exam there is been progressive ground-glass opacification bilaterally. Upper Abdomen: Bilateral kidney cysts are noted. Incompletely characterized without IV contrast. Low-attenuation nodule in the right adrenal gland measures -4 Hounsfield units and is compatible with a benign adenoma. Musculoskeletal: No chest wall mass or suspicious bone lesions identified. Thoracic degenerative disc disease is noted. IMPRESSION: 1. Persistent bilateral multifocal airspace and ground-glass opacities compatible with multifocal pneumonia. Compared with the previous exam there has been progressive ground-glass opacification bilaterally. 2. No evidence for clinically significant pleural effusion or pulmonary abscess. 3. Aortic atherosclerosis. Coronary artery calcifications noted. 4. Right adrenal gland adenoma. Aortic Atherosclerosis (ICD10-I70.0). Electronically Signed   By: Kerby Moors M.D.   On: 06/03/2020 10:27   DG CHEST PORT 1 VIEW  Result Date: 06/02/2020 CLINICAL DATA:  Coughing, low accident saturation. EXAM: PORTABLE CHEST 1 VIEW COMPARISON:  Chest radiograph dated 05/14/2016, CT chest dated 05/31/2020. FINDINGS: The heart is enlarged. Moderate patchy bilateral interstitial and airspace opacities, right greater than left, are noted. Small bilateral pleural effusions are present. No pneumothorax. Degenerative changes are seen in the  spine and shoulders. IMPRESSION: 1. Moderate patchy bilateral interstitial and airspace opacities, right greater than left, suspicious for multifocal pneumonia and/or pulmonary edema. 2. Small bilateral pleural effusions. Electronically Signed   By: Zerita Boers M.D.   On: 06/02/2020 20:26        Scheduled Meds: . Chlorhexidine Gluconate Cloth  6 each Topical Daily  . dextromethorphan-guaiFENesin  1 tablet Oral BID  . enoxaparin (LOVENOX) injection  75 mg Subcutaneous Q24H  . insulin aspart  3-9 Units Subcutaneous Q4H   Continuous Infusions: . sodium chloride Stopped (06/03/20 0954)  . azithromycin    . ceFEPime (MAXIPIME) IV Stopped (06/03/20 0925)  . vancomycin       Time spent: 45 minutes with over 50% of the time coordinating the patient's care    Harold Hedge, DO [facility]   Call night coverage person covering after 7pm

## 2020-06-03 NOTE — Progress Notes (Signed)
[name]  Date of Admission:  05/31/2020           Reason for visit: Follow up on multilobar pneumonia and prosthetic hip infection  Current antibiotics: Cefepime 4/21-present Vancomycin 4/21-present Azithromycin 4/23-present  Previous antibiotics: Daptomycin and ceftriaxone prior to admission 05/03/2020 to 05/30/2020   ASSESSMENT & PLAN:    1.  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  Patient was on daptomycin and ceftriaxone prior to admission for presumed left hip prosthetic joint infection as noted below.  However, started on vancomycin and cefepime at admission with azithromycin added yesterday.  Has had worsening oxygen requirements and continued dry cough necessitating transfer to the ICU and initiation of BiPAP.  Underwent repeat CT scan this afternoon with persistent bilateral multifocal airspace and groundglass opacities compatible with multifocal pneumonia with progressive groundglass opacification bilaterally since prior exam.  Sputum cultures are pending, Legionella and strep urine antigens pending.  Discussed with pulmonology this afternoon regarding bronchoscopy that would be needed to diagnose eosinophilic pneumonia, however, patient would currently not tolerate bronchoscopy without being intubated.  Plan is to initiate steroids due to high suspicion for this as an etiology for his worsening respiratory status.  2.  Recent COVID-19 positivity: Patient tested positive as a preop clearance test in January 2022 but was asymptomatic at that time.  He received no COVID therapeutics or immunosuppression at that time.  He reports that he was vaccinated x2 in September 2021.  Post COVID  complications also in the differential including COVID lung or even post COVID Aspergillus but this seems much less likely.  3.  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  Currently on vancomycin and cefepime as above.  4.  PICC line associated soft tissue infection and edema: PICC line has been removed and edema and erythema of left upper extremity appears improved.  Ultrasound was negative for DVT.  5.  CKD: Creatinine this morning is 1.36.  6.  Hypoxemic respiratory failure: Secondary to #1   -- Continue cefepime and vancomycin -- Continue azithromycin for atypical coverage -- Follow-up sputum and blood cultures.  Follow-up strep and Legionella urine antigens -- Agree with initiation of steroids due to lack of improvement on broad-spectrum antimicrobial coverage and concern for eosinophilic pneumonia -- Continue to monitor renal function on vancomycin -- BiPAP and supplemental oxygen per critical care   Principal Problem:   Sepsis ([place]) Active Problems:   S/P left THA, AA   S/P revision of total hip   Obese   HCAP (healthcare-associated pneumonia)   Cellulitis   Acute hypoxemic respiratory failure (HCC)    MEDICATIONS:    Scheduled Meds: . Chlorhexidine Gluconate Cloth  6 each Topical Daily  . dextromethorphan-guaiFENesin  1 tablet Oral BID  . enoxaparin (LOVENOX) injection  75 mg Subcutaneous Q24H  . insulin aspart  3-9 Units Subcutaneous Q4H   Continuous Infusions: . sodium chloride 10 mL/hr at 06/03/20 1256  . azithromycin Stopped (06/03/20 1245)  . ceFEPime (MAXIPIME) IV Stopped (06/03/20 0925)  . vancomycin     PRN Meds:.sodium chloride, acetaminophen **OR** acetaminophen, benzonatate, HYDROcodone-homatropine, ipratropium-albuterol, ondansetron **  OR** ondansetron (ZOFRAN) IV,  phenol  SUBJECTIVE:   24 hour events:  Patient's chart reviewed today with overnight events noted prompting patient being moved to the stepdown unit due to persistent hypoxia requiring more oxygen up to 15 L nasal cannula.  Noted to have improvement with initiation of BiPAP.  He has been continued on vancomycin and cefepime.  Azithromycin was added 06/02/2020.  Daptomycin has been held since 05/30/2020 due to admission for multi lobar pneumonia and inadequate lung coverage as well as due to the concern for possible eosinophilic pneumonia given his cough that is significantly worsened about 2 weeks after starting therapy.  He has remained afebrile and his requiring BiPAP this afternoon.  Sputum culture was sent and is currently pending.  He underwent repeat CT of the chest this afternoon which showed persistent bilateral multifocal airspace and groundglass opacities compatible with multifocal pneumonia.  Patient reports that he is thirsty and wants something to drink.  He states he is hanging in there.  He remains dyspneic requiring BiPAP.  His wife and daughter present in the room.  He continues to have a dry cough.  Review of Systems  All other systems reviewed and are negative.     OBJECTIVE:   Blood pressure (!) 112/51, pulse 69, temperature 98.6 F (37 C), temperature source Oral, resp. rate (!) 27, height 6\' 7"  (2.007 m), weight (!) 149.4 kg, SpO2 95 %. Body mass index is 37.1 kg/m.  Physical Exam Constitutional:      Comments: Elderly man lying in bed, in mild distress, on BiPAP, wife and daughter present.  HENT:     Head: Normocephalic and atraumatic.  Pulmonary:     Comments: Slightly increased work of breathing on BiPAP. Abdominal:     General: There is no distension.     Palpations: Abdomen is soft.     Tenderness: There is no abdominal tenderness. There is no guarding.  Skin:    General: Skin is warm and dry.     Comments: Improved left upper extremity swelling and redness   Neurological:     General: No focal deficit present.     Mental Status: He is oriented to person, place, and time.  Psychiatric:        Mood and Affect: Mood normal.        Behavior: Behavior normal.      Lab Results: Lab Results  Component Value Date   WBC 18.3 (H) 06/03/2020   HGB 8.7 (L) 06/03/2020   HCT 29.4 (L) 06/03/2020   MCV 82.6 06/03/2020   PLT 322 06/03/2020    Lab Results  Component Value Date   NA 137 06/03/2020   K 4.0 06/03/2020   CO2 23 06/03/2020   GLUCOSE 129 (H) 06/03/2020   BUN 45 (H) 06/03/2020   CREATININE 1.36 (H) 06/03/2020   CALCIUM 8.2 (L) 06/03/2020   GFRNONAA 54 (L) 06/03/2020   GFRAA >60 08/29/2018    Lab Results  Component Value Date   ALT 18 06/01/2020   AST 23 06/01/2020   ALKPHOS 107 06/01/2020   BILITOT 0.5 06/01/2020       Component Value Date/Time   CRP 0.6 05/04/2020 0418       Component Value Date/Time   ESRSEDRATE 86 (H) 06/03/2020 1122     I have reviewed the micro and lab results in Epic.  Imaging: CT CHEST WO CONTRAST  Result Date: 06/03/2020 CLINICAL DATA:  Follow-up pneumonia. EXAM: CT CHEST WITHOUT CONTRAST TECHNIQUE: Multidetector CT imaging  of the chest was performed following the standard protocol without IV contrast. COMPARISON:  05/31/2020 FINDINGS: Cardiovascular: Heart size appears within normal limits. No pericardial effusion. Aortic atherosclerosis. Coronary artery calcifications. Mediastinum/Nodes: Prominent mediastinal lymph nodes are identified, none of which meet CT criteria for adenopathy. This likely reflects reactive change. Thyroid gland, trachea, and esophagus demonstrate no significant findings. Lungs/Pleura: Trace pleural fluid identified overlying the posterior lung bases. Bilateral multifocal airspace and ground-glass opacities are again noted identified compatible with multifocal pneumonia. This is most severe within the posterolateral right lower lobe, anterolateral left lower lobe and  periphery of the right upper lobe. Compared with the previous exam there is been progressive ground-glass opacification bilaterally. Upper Abdomen: Bilateral kidney cysts are noted. Incompletely characterized without IV contrast. Low-attenuation nodule in the right adrenal gland measures -4 Hounsfield units and is compatible with a benign adenoma. Musculoskeletal: No chest wall mass or suspicious bone lesions identified. Thoracic degenerative disc disease is noted. IMPRESSION: 1. Persistent bilateral multifocal airspace and ground-glass opacities compatible with multifocal pneumonia. Compared with the previous exam there has been progressive ground-glass opacification bilaterally. 2. No evidence for clinically significant pleural effusion or pulmonary abscess. 3. Aortic atherosclerosis. Coronary artery calcifications noted. 4. Right adrenal gland adenoma. Aortic Atherosclerosis (ICD10-I70.0). Electronically Signed   By: Kerby Moors M.D.   On: 06/03/2020 10:27   DG CHEST PORT 1 VIEW  Result Date: 06/02/2020 CLINICAL DATA:  Coughing, low accident saturation. EXAM: PORTABLE CHEST 1 VIEW COMPARISON:  Chest radiograph dated 05/14/2016, CT chest dated 05/31/2020. FINDINGS: The heart is enlarged. Moderate patchy bilateral interstitial and airspace opacities, right greater than left, are noted. Small bilateral pleural effusions are present. No pneumothorax. Degenerative changes are seen in the spine and shoulders. IMPRESSION: 1. Moderate patchy bilateral interstitial and airspace opacities, right greater than left, suspicious for multifocal pneumonia and/or pulmonary edema. 2. Small bilateral pleural effusions. Electronically Signed   By: Zerita Boers M.D.   On: 06/02/2020 20:26   ECHOCARDIOGRAM COMPLETE  Result Date: 06/03/2020    ECHOCARDIOGRAM REPORT   Patient Name:   Jacob Mitchell Date of Exam: 06/03/2020 Medical Rec #:  [id number]    Height:       79.0 in Accession #:    [id number]   Weight:       329.4 lb  Date of Birth:  [birth year]    BSA:          2.817 m Patient Age:    75 years     BP:           123/64 mmHg Patient Gender: M            HR:           73 bpm. Exam Location:  Inpatient Procedure: 2D Echo, Cardiac Doppler, Color Doppler and Intracardiac            Opacification Agent Indications:    Acutre respiratory distress R06.03  History:        Patient has no prior history of Echocardiogram examinations.                 Sepsis. Acute hypoxemic respiratory failure.  Sonographer:    Darlina Sicilian RDCS Referring Phys: [phone] [facility]  Sonographer Comments: Technically difficult study due to poor echo windows. On BiPap IMPRESSIONS  1. Technically difficult study, limited views even with contrast. Left ventricular ejection fraction, by estimation, is 50 to 55%. The left ventricle has grossly low normal function. Left ventricular endocardial border not  optimally defined to evaluate regional wall motion. There is mild left ventricular hypertrophy. Left ventricular diastolic parameters are consistent with Grade I diastolic dysfunction (impaired relaxation).  2. Right ventricular systolic function is normal. The right ventricular size is mildly enlarged. Tricuspid regurgitation signal is inadequate for assessing PA pressure.  3. The mitral valve is grossly normal. No evidence of mitral valve regurgitation. No evidence of mitral stenosis.  4. The aortic valve was not well visualized. Aortic valve regurgitation is not visualized. No aortic stenosis is present.  5. Aortic dilatation noted. Aneurysm of the ascending aorta, measuring 45 mm. There is mild dilatation of the aortic root, measuring 41 mm. FINDINGS  Left Ventricle: Left ventricular ejection fraction, by estimation, is 50 to 55%. The left ventricle has low normal function. Left ventricular endocardial border not optimally defined to evaluate regional wall motion. Definity contrast agent was given IV  to delineate the left ventricular endocardial borders. The  left ventricular internal cavity size was normal in size. There is mild left ventricular hypertrophy. Left ventricular diastolic parameters are consistent with Grade I diastolic dysfunction (impaired relaxation). Right Ventricle: The right ventricular size is mildly enlarged. No increase in right ventricular wall thickness. Right ventricular systolic function is normal. Tricuspid regurgitation signal is inadequate for assessing PA pressure. Left Atrium: Left atrial size was normal in size. Right Atrium: Right atrial size was normal in size. Pericardium: Trivial pericardial effusion is present. Mitral Valve: The mitral valve is grossly normal. No evidence of mitral valve regurgitation. No evidence of mitral valve stenosis. Tricuspid Valve: The tricuspid valve is normal in structure. Tricuspid valve regurgitation is not demonstrated. Aortic Valve: The aortic valve was not well visualized. Aortic valve regurgitation is not visualized. No aortic stenosis is present. Pulmonic Valve: The pulmonic valve was not well visualized. Pulmonic valve regurgitation is not visualized. Aorta: Aortic dilatation noted. There is mild dilatation of the aortic root, measuring 41 mm. There is an aneurysm involving the ascending aorta measuring 45 mm. IAS/Shunts: The interatrial septum was not well visualized.  LEFT VENTRICLE PLAX 2D LVIDd:         5.50 cm  Diastology LVIDs:         4.00 cm  LV e' medial:    6.53 cm/s LV PW:         1.30 cm  LV E/e' medial:  7.7 LV IVS:        1.30 cm  LV e' lateral:   12.90 cm/s LVOT diam:     2.50 cm  LV E/e' lateral: 3.9 LVOT Area:     4.91 cm  RIGHT VENTRICLE RV S prime:     27.10 cm/s TAPSE (M-mode): 2.3 cm LEFT ATRIUM             Index       RIGHT ATRIUM           Index LA diam:        4.00 cm 1.42 cm/m  RA Area:     17.40 cm LA Vol (A2C):   69.7 ml 24.74 ml/m RA Volume:   38.80 ml  13.77 ml/m LA Vol (A4C):   64.5 ml 22.90 ml/m LA Biplane Vol: 68.5 ml 24.32 ml/m   AORTA Ao Root diam: 4.10 cm Ao  Asc diam:  4.50 cm MITRAL VALVE MV Area (PHT): 1.72 cm    SHUNTS MV Decel Time: 440 msec    Systemic Diam: 2.50 cm MV E velocity: 50.60 cm/s MV A velocity: 83.60 cm/s MV  E/A ratio:  0.61 Oswaldo Milian MD Electronically signed by Oswaldo Milian MD Signature Date/Time: 06/03/2020/12:57:36 PM    Final      Imaging independently reviewed in Epic.    [facility] [phone] pager 06/03/2020, 2:37 PM  I spent greater than 35 minutes with the patient including greater than 50% of time in face to face counsel of the patient and in coordination of their care.

## 2020-06-03 NOTE — Consult Note (Addendum)
NAME:  Jacob Mitchell, MRN:  [id number], DOB:  06/12/1945, LOS: 3 ADMISSION DATE:  05/31/2020, CONSULTATION DATE:  06/03/20 REFERRING MD:  Dr Marva Panda, CHIEF COMPLAINT:  Acute hypoxemic resp failure in setting of daptiomycin and left hip sepsi   History of Present Illness:   75 year old obese male, non-smoker but.  Has a history of left hip prosthesis that has been complicated by multiple revisions due to subluxation and recurrent left hip seroma.  He had a fall in November 2021 with a left trochanteric femur fracture and status post ORIF in December 2021.  In early January 2022 to get COVID-19 (unvaxx but not admitted).  Subsequently had a second ORIF in mid January 2022 due to periprosthetic fracture.  Recommend incision and drainage on May 01, 2020.  At this point in time placed on daptomycin and ceftriaxone for 6 weeks upper extremity PICC line.  Admitted on 05/31/2020 [1 month into daptomycin and ceftriaxone treatment] with clinically left PICC line related suspected cellulitis but also cough and shortness of breath [he also had cough even before daptomycin] but the cough is worsened over 2 weeks prior to admission.  Left upper extremity duplex negative for DVT at admission.  CT angiogram chest negative for pulmonary embolus but showed mild cardiomegaly and bilateral small pleural effusions with bilateral patchy groundglass opacities especially in the lower lobe.  Respiratory virus panel negative.  Seen by infectious disease consultation and orthopedics 06/01/2020 and antibiotics changed.  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  Course since admission  -Appears that entry into the ER was 91% on room air.  Somewhere along the way he was on 2 L nasal cannula.  But on the evening of 06/02/2020 he started requiring more oxygen and transferred to stepdown.  On the morning of 06/03/2020 was getting somnolent and requiring 15 L nasal cannula with easy desaturations and significant tachypnea.   Critical care medicine ordered BiPAP and he had rapid improvement in mental status.  Pulse ox 99% on 60% FiO2 on BiPAP   Antibiotic history   Daptomycin 3/24 >> (stopped4/20/22, intent was to stop 06/14/20) Ceftriaxone 3/24 >> (Stopped 05/30/20, intent was to stop 06/14/20) xxxx Vanc 4/21>> Cefepime 4/21 >> Azithro 4/23  >> (4/28)   Peripheral eos hx  Results for DANTHONY, KENDRIX (MRN [id number]) as of 06/03/2020 08:29  Ref. Range 01/17/2020 12:20 01/17/2020 14:56 01/17/2020 15:37 01/18/2020 03:28 01/19/2020 03:30 02/07/2020 08:46 02/27/2020 09:50 02/27/2020 13:39 02/28/2020 03:39 02/29/2020 03:33 05/01/2020 13:51 05/01/2020 17:29 05/02/2020 08:55 05/03/2020 08:37 05/04/2020 09:15 05/31/2020 17:42 06/01/2020 03:21 06/02/2020 06:07 06/03/2020 03:21  Eosinophils Absolute Latest Ref Range: 0.0 - 0.5 K/uL                0.3  0.0     Pertinent  Medical History     has a past medical history of Benign localized prostatic hyperplasia with lower urinary tract symptoms (LUTS), Complication of anesthesia, Diverticulosis of colon, History of colon polyps, History of kidney stones, History of MRSA infection (2007), History of urinary retention (05/31/2017), Incomplete emptying of bladder, OA (osteoarthritis), Peripheral neuropathy, Pinched nerve in neck, Wears glasses, and Wears hearing aid in both ears.   reports that he has never smoked. He has never used smokeless tobacco.  Past Surgical History:  Procedure Laterality Date  . CATARACT EXTRACTION Left 01/13/2020  . COLONOSCOPY    . CYST REMOVAL LEG Right 1975  . CYSTOSCOPY WITH INSERTION OF UROLIFT N/A 07/20/2017   Procedure: CYSTOSCOPY WITH INSERTION OF UROLIFT;  Surgeon: Cleon Gustin, MD;  Location: [facility];  Service: Urology;  Laterality: N/A;  . CYSTOSCOPY WITH INSERTION OF UROLIFT    . DISTAL CLAVICLE EXCISION Left 1977   left shoulder  . ELBOW SURGERY Right 1969;  1980  . HAND SURGERY Right 1968  . INCISION AND DRAINAGE HIP Left  05/01/2020   Procedure: IRRIGATION AND DEBRIDEMENT LEFT HIP SEROMA;  Surgeon: Paralee Cancel, MD;  Location: [facility];  Service: Orthopedics;  Laterality: Left;  90 mins  . LAMINECTOMY  1970   L4-5  . LUMBAR FUSION  2013  approx.   L1--L5  . ORIF HIP FRACTURE Left 01/17/2020   Procedure: OPEN REDUCTION INTERNAL FIXATION LEFT HIP GREATER TROCHANTER FRACTURE;  Surgeon: Paralee Cancel, MD;  Location: [facility];  Service: Orthopedics;  Laterality: Left;  90 MINS  . ORIF PERIPROSTHETIC FRACTURE Left 02/27/2020   Procedure: OPEN REDUCTION INTERNAL FIXATION (ORIF) LEFT PERI-PROSTETIC  HIP FRACTURE;  Surgeon: Paralee Cancel, MD;  Location: [facility];  Service: Orthopedics;  Laterality: Left;  52mins  . ROTATOR CUFF REPAIR Right x5    last one 2007   5 surgeries in 9 days--- original repair then 4 sx'  I&D sx's for MRSA infection  . SHOULDER ARTHROSCOPY WITH DISTAL CLAVICLE RESECTION Left 1977  . SHOULDER SURGERY Left 1976  . TOTAL HIP ARTHROPLASTY Left 11/23/2015   Procedure: LEFT TOTAL HIP ARTHROPLASTY ANTERIOR APPROACH;  Surgeon: Paralee Cancel, MD;  Location: [facility];  Service: Orthopedics;  Laterality: Left;  . TOTAL HIP REVISION     10/14/16 Dr. Alvan Dame  . TOTAL HIP REVISION Left 10/14/2016   Procedure: TOTAL HIP REVISION POSTERIOR APPROACH;  Surgeon: Paralee Cancel, MD;  Location: [facility];  Service: Orthopedics;  Laterality: Left; (total of 4 hip surgeries in all)    Allergies  Allergen Reactions  . Aspartame And Phenylalanine Palpitations    Artificial sweetener- hrt 140  went to ED    Immunization History  Administered Date(s) Administered  . Influenza, High Dose Seasonal PF 11/17/2016, 11/06/2017  . Influenza,inj,Quad PF,6+ Mos 11/25/2015  . Influenza-Unspecified 11/25/2015, 11/17/2016    Family History  Problem Relation Age of Onset  . Alcohol abuse Father   . Drug abuse Brother      Current Facility-Administered Medications:  .  0.9 %  sodium chloride infusion, , Intravenous, PRN, Harold Hedge,  MD, Last Rate: 10 mL/hr at 06/03/20 0854, 250 mL at 06/03/20 0854 .  acetaminophen (TYLENOL) tablet 650 mg, 650 mg, Oral, Q6H PRN **OR** acetaminophen (TYLENOL) suppository 650 mg, 650 mg, Rectal, Q6H PRN, Jonelle Sidle, Mohammad L, MD .  [COMPLETED] azithromycin (ZITHROMAX) tablet 500 mg, 500 mg, Oral, Daily, 500 mg at 06/02/20 1042 **FOLLOWED BY** azithromycin (ZITHROMAX) tablet 250 mg, 250 mg, Oral, Daily, Marva Panda E, MD .  benzonatate (TESSALON) capsule 200 mg, 200 mg, Oral, TID PRN, Lovey Newcomer T, NP, 200 mg at 06/03/20 0256 .  ceFEPIme (MAXIPIME) 2 g in sodium chloride 0.9 % 100 mL IVPB, 2 g, Intravenous, Q8H, Angela Adam, RPH, Last Rate: 200 mL/hr at 06/03/20 0855, 2 g at 06/03/20 0855 .  Chlorhexidine Gluconate Cloth 2 % PADS 6 each, 6 each, Topical, Daily, Harold Hedge, MD, 6 each at 06/02/20 2105 .  dextromethorphan-guaiFENesin (MUCINEX DM) 30-600 MG per 12 hr tablet 1 tablet, 1 tablet, Oral, BID, Blount, Xenia T, NP, 1 tablet at 06/02/20 2142 .  enoxaparin (LOVENOX) injection 75 mg, 75 mg, Subcutaneous, Q24H, Harold Hedge, MD .  HYDROcodone-homatropine (  HYCODAN) 5-1.5 MG/5ML syrup 5 mL, 5 mL, Oral, Q4H PRN, Charlesetta Shanks, MD, 5 mL at 06/02/20 1821 .  ipratropium-albuterol (DUONEB) 0.5-2.5 (3) MG/3ML nebulizer solution 3 mL, 3 mL, Nebulization, Q2H PRN, Pfeiffer, Marcy, MD, 3 mL at 06/03/20 0300 .  ondansetron (ZOFRAN) tablet 4 mg, 4 mg, Oral, Q6H PRN **OR** ondansetron (ZOFRAN) injection 4 mg, 4 mg, Intravenous, Q6H PRN, Garba, Mohammad L, MD .  phenol (CHLORASEPTIC) mouth spray 1 spray, 1 spray, Mouth/Throat, PRN, Harold Hedge, MD, 1 spray at 06/02/20 1044 .  vancomycin (VANCOREADY) IVPB 1000 mg/200 mL, 1,000 mg, Intravenous, Q12H, Emiliano Dyer, [place] [facility] Events: Including procedures, antibiotic start and stop dates in addition to other pertinent events   . 05/31/2020 - admit.  91% oxygen on room air . 06/01/2020: 91% oxygen on room air.  Dr. Alvan Dame  orthopedic consult: Recurrent left hip seroma.  Antibiotic plan per infectious diseases.  If no IV antibiotic planned and recommended doxycycline for suppression for 6 months at least and consider IR drain to the left hip o Left upper extremity PICC line removed . 4/22 ID consut - recopmmend dc pic line due to cellulitis concern and marked upper extremity edema and follow-up blood cultures.  Consider you cephalic pneumonia. . 4/23 - speech eval -regular diet, think liquids    Interim History / Subjective:   4/24 - seen by CCM at bed [place] ICU. startted on BiPAP and feeling better  Objective   Blood pressure (!) 114/48, pulse 70, temperature 97.7 F (36.5 C), temperature source Oral, resp. rate (!) 27, height 6\' 7"  (579FGE m), weight (!) 149.4 kg, SpO2 100 %.    FiO2 (%):  [70 %] 70 %   Intake/Output Summary (Last 24 hours) at 06/03/2020 0910 Last data filed at 06/03/2020 [id number] Gross per 24 hour  Intake 757.98 ml  Output 2525 ml  Net -1767.02 ml   Filed Weights   05/31/20 1741 06/02/20 2100  Weight: 136.1 kg (!) 149.4 kg    Examination: General: Obese male on BiPAP HENT: BiPAP present Lungs: Distant but clear to auscultation some lower lobe crackles.  Was tachypneic in the 2s but currently in the 20s and not paradoxical Cardiovascular: Sinus tachycardia Abdomen: Obese and soft nontender Extremities: No cyanosis no clubbing no edema Neuro: Was somnolent and partially oriented but now after BiPAP alert and oriented x3 GU: Not examined  Labs/imaging that I havepersonally reviewed  (right click and "Reselect all SmartList Selections" daily)  See below  Resolved Hospital Problem list     Assessment & Plan:   Acute Hypoxemic Respiratory Failure - severe, needing BiPAP and high fio2.  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 -> so hold off  - empiric steroids (risk, benefitis, limitations d/w patient, wife, Dr Juleen China of ID and Dr Neysa Bonito of [facility])  - all on board   -s tart solumedrol 125mg  Q6h and taper based on clinical response - BiPAP for acute hypoxemic resp failure  - 4h on and 1-2h off day time + QHS versus continuous first  24h and then decide   - CT chest without contrast  - rule out other etiologies   - get ECHO  - get urine strep, urine legionella  - recheck PCT  - check Covid Ig G (response to natural infection, he  Is not vaccinated) -  - steroid safety  - check G6PDH - in case he needs bactrim  - check Quantiferon gold  - start ICU hyperglycemia protocool  - check HIV  - recheck Procalcitonin      Best practice (right click and "Reselect all SmartList Selections" daily)  Per [facility]  Full code   ATTESTATION & SIGNATURE   The patient TERREL DRISKEL is critically ill with multiple organ systems failure and requires high complexity decision making for assessment and support, frequent evaluation and titration of therapies, application of advanced monitoring technologies and extensive interpretation of multiple databases.   Critical Care Time devoted to patient care services described in this note is  60  Minutes. This time reflects time of care of this signee Dr Brand Males. This critical care time does not reflect procedure time, or teaching time or supervisory time of PA/NP/Med student/Med Resident etc but could involve care discussion time     Dr. Brand Males, M.D., [facility].C.P Pulmonary and Critical Care Medicine Staff Physician [place] [facility] Pager: [phone], If no answer or between  15:00h - 7:00h: call [phone]   [phone]  06/03/2020 9:11 AM    LABS    PULMONARY Recent Labs  Lab 06/02/20 2020 06/03/20 0540  PHART 7.383 7.367  PCO2ART 34.8 40.1  PO2ART 75.8* 70.1*  HCO3 20.3 22.5  O2SAT 94.2 92.7    CBC Recent Labs  Lab 06/01/20 0321 06/02/20 0607 06/03/20 0321  HGB 8.3* 8.9* 8.7*  HCT 28.2* 30.1* 29.7*  WBC 12.1* 21.3* 20.2*  PLT 346 409* 355    COAGULATION Recent Labs  Lab 05/31/20 2026 06/01/20 0321  INR 1.2 1.3*    CARDIAC  No results for input(s): TROPONINI in the last 168 hours. No results for input(s): PROBNP in the last 168 hours.   CHEMISTRY Recent Labs  Lab 05/31/20 1742 06/01/20 0321 06/02/20 0607 06/03/20 0321  NA 143 136 140 137  K 4.0 3.9 4.4 4.0  CL 107 105 106 104  CO2 24 22 21* 23  GLUCOSE 110* 109* 107* 129*  BUN 27* 30* 38* 45*  CREATININE 1.46* 1.35* 1.45* 1.36*  CALCIUM 8.3* 8.1* 8.4* 8.2*   Estimated Creatinine Clearance: 75.8 mL/min (A) (by C-G formula based on SCr of 1.36 mg/dL (H)).   LIVER Recent Labs  Lab 05/31/20 2026 06/01/20 0321  AST 26 23  ALT 20 18  ALKPHOS 126 107  BILITOT 0.5 0.5  PROT 7.0 6.0*  ALBUMIN 3.1* 2.8*  INR 1.2 1.3*     INFECTIOUS Recent Labs  Lab 05/31/20 2026 05/31/20 2230 06/01/20 0321  LATICACIDVEN 2.0* 1.5  --   PROCALCITON  --   --  0.43     ENDOCRINE CBG (last 3)  No results for input(s): GLUCAP in the last 72 hours.       IMAGING x48h  - image(s) personally visualized  -   highlighted in bold DG CHEST PORT 1 VIEW  Result Date: 06/02/2020 CLINICAL DATA:  Coughing, low accident saturation. EXAM: PORTABLE CHEST 1 VIEW COMPARISON:  Chest radiograph dated 05/14/2016, CT chest dated 05/31/2020. FINDINGS: The heart is enlarged. Moderate patchy bilateral interstitial and airspace opacities, right  greater than left, are noted. Small bilateral pleural effusions are present. No pneumothorax. Degenerative changes are seen in the spine and shoulders. IMPRESSION: 1. Moderate patchy  bilateral interstitial and airspace opacities, right greater than left, suspicious for multifocal pneumonia and/or pulmonary edema. 2. Small bilateral pleural effusions. Electronically Signed   By: Zerita Boers M.D.   On: 06/02/2020 20:26

## 2020-06-03 NOTE — Progress Notes (Signed)
Called to evaluate pt SOB and sats in 70's.  Arrived to find pt coughing very vigorously and diaphoretic.  VSS, see chart.  Lungs decreased  and crackles in bil. Bases.  PCXR, blood gas and nebs ordered for pt per RRT protocol.  [facility], NP made aware informed of pt to transfer to [place] for closer monitoring.

## 2020-06-03 NOTE — Progress Notes (Signed)
SLP Cancellation Note  Patient Details Name: Jacob Mitchell MRN: [id number] DOB: 19-Nov-1945   Cancelled treatment:       Reason Eval/Treat Not Completed: Medical issues which prohibited therapy. Patient is currently NPO due to respiratory issues; currently requiring NRB and per RN, desats to 70% when NRB  taken off. SLP will continue to follow patient for readiness to resume PO's and to determine need for objective swallow study.   Sonia Baller, MA, CCC-SLP Speech Therapy

## 2020-06-03 NOTE — TOC Progression Note (Signed)
Transition of Care [facility]) - Progression Note    Patient Details  Name: Jacob Mitchell MRN: [id number] Date of Birth: 09-20-1945  Transition of Care [facility]) CM/SW Contact  Purcell Mouton, RN Phone Number: 06/03/2020, 3:15 PM  Clinical Narrative:     Pt is from home with spouse and active with [facility] for [place].   Expected Discharge Plan: [place] Barriers to Discharge: No Barriers Identified  Expected Discharge Plan and Services Expected Discharge Plan: [place] arrangements for the past 2 months: Single Family Home                                       Social Determinants of Health (SDOH) Interventions    Readmission Risk Interventions Readmission Risk Prevention Plan 05/03/2020  Post Dischage Appt Complete  Medication Screening Complete  Transportation Screening Complete  Some recent data might be hidden

## 2020-06-03 NOTE — Progress Notes (Signed)
Pharmacy Antibiotic Note  Jacob Mitchell is a 75 y.o. male admitted on 05/31/2020 with PICC line in place for IV abx for PJI (CTX and daptomycin) for left hip infection. Pharmacy has been consulted to dose vancomycin and cefepime for pna.  06/03/2020 Day #3 full abx - Afebrile - WBC 20.2 - SCr improved 1.36, CrCl 75 ml/min  Plan: Adjust Vancomycin to 1g IV q12h Continue Cefepime 2gm IV q8h Follow renal function, cultures and clinical course  Height: 6\' 7"  (200.7 cm) Weight: (!) 149.4 kg (329 lb 5.9 oz) IBW/kg (Calculated) : 93.7  Temp (24hrs), Avg:98.4 F (36.9 C), Min:97.5 F (36.4 C), Max:99.3 F (37.4 C)  Recent Labs  Lab 05/31/20 1742 05/31/20 2026 05/31/20 2230 06/01/20 0321 06/02/20 0607 06/03/20 0321  WBC 13.1*  --   --  12.1* 21.3* 20.2*  CREATININE 1.46*  --   --  1.35* 1.45* 1.36*  LATICACIDVEN  --  2.0* 1.5  --   --   --     Estimated Creatinine Clearance: 75.8 mL/min (A) (by C-G formula based on SCr of 1.36 mg/dL (H)).    Allergies  Allergen Reactions  . Aspartame And Phenylalanine Palpitations    Artificial sweetener- hrt 140  went to ED    Antimicrobials this admission: 4/21 vanc >> 4/21 cefepime >>  4/23 azithromycin >> (4/27)  Dose adjustments this admission: 4/21 then 1500 mg q24h (AUC 426, Scr 1.46 )(the dose calculator is 1750mg  q24 (AUC 496.9) but since there's a shortage used 1500) 4/24 Adjust 1g q12h for improved SCr 1.36 (AUC 486, Cmin 15.1)  Microbiology results: 4/21 BCx: ngtd 4/23 SCx: 4/23 MRSA PCR: neg  Thank you for allowing pharmacy to be a part of this patient's care.  Peggyann Juba, PharmD, BCPS Pharmacy: [phone] 06/03/2020, 8:39 AM

## 2020-06-03 NOTE — Progress Notes (Signed)
  Echocardiogram 2D Echocardiogram with definity has been performed.  Darlina Sicilian M 06/03/2020, 12:31 PM

## 2020-06-03 NOTE — Progress Notes (Signed)
   CT worse ESR 80s PCT up in 2s  Plan  - start solumedrol $RemoveBeforeDE'125mg'[id number]$  Q6h (risk of infection worsening hjas to be accepted but will need to taper steroids asap)    SIGNATURE    Dr. Brand Males, M.D., F.C.C.P,  Pulmonary and Critical Care Medicine Staff Physician, [place] Director - Interstitial Lung Disease  Program  Pulmonary [place] at [place], [place], [address]  Pager: [phone], If no answer  OR between  19:00-7:00h: page [phone] Telephone (clinical office): [phone] Telephone (research): [phone]  4:08 PM 06/03/2020

## 2020-06-04 DIAGNOSIS — Z9181 History of falling: Secondary | ICD-10-CM | POA: Diagnosis not present

## 2020-06-04 DIAGNOSIS — J8281 Chronic eosinophilic pneumonia: Secondary | ICD-10-CM

## 2020-06-04 DIAGNOSIS — A419 Sepsis, unspecified organism: Secondary | ICD-10-CM | POA: Diagnosis not present

## 2020-06-04 DIAGNOSIS — L03114 Cellulitis of left upper limb: Secondary | ICD-10-CM

## 2020-06-04 DIAGNOSIS — J984 Other disorders of lung: Secondary | ICD-10-CM

## 2020-06-04 DIAGNOSIS — Z452 Encounter for adjustment and management of vascular access device: Secondary | ICD-10-CM | POA: Diagnosis not present

## 2020-06-04 DIAGNOSIS — Z79899 Other long term (current) drug therapy: Secondary | ICD-10-CM | POA: Diagnosis not present

## 2020-06-04 DIAGNOSIS — J189 Pneumonia, unspecified organism: Secondary | ICD-10-CM

## 2020-06-04 DIAGNOSIS — T8459XA Infection and inflammatory reaction due to other internal joint prosthesis, initial encounter: Secondary | ICD-10-CM

## 2020-06-04 DIAGNOSIS — K5730 Diverticulosis of large intestine without perforation or abscess without bleeding: Secondary | ICD-10-CM | POA: Diagnosis not present

## 2020-06-04 DIAGNOSIS — G4700 Insomnia, unspecified: Secondary | ICD-10-CM | POA: Diagnosis not present

## 2020-06-04 DIAGNOSIS — Z86010 Personal history of colonic polyps: Secondary | ICD-10-CM | POA: Diagnosis not present

## 2020-06-04 DIAGNOSIS — R338 Other retention of urine: Secondary | ICD-10-CM | POA: Diagnosis not present

## 2020-06-04 DIAGNOSIS — S72112D Displaced fracture of greater trochanter of left femur, subsequent encounter for closed fracture with routine healing: Secondary | ICD-10-CM | POA: Diagnosis not present

## 2020-06-04 DIAGNOSIS — G629 Polyneuropathy, unspecified: Secondary | ICD-10-CM | POA: Diagnosis not present

## 2020-06-04 DIAGNOSIS — Z87442 Personal history of urinary calculi: Secondary | ICD-10-CM | POA: Diagnosis not present

## 2020-06-04 DIAGNOSIS — Z981 Arthrodesis status: Secondary | ICD-10-CM | POA: Diagnosis not present

## 2020-06-04 DIAGNOSIS — Z8614 Personal history of Methicillin resistant Staphylococcus aureus infection: Secondary | ICD-10-CM | POA: Diagnosis not present

## 2020-06-04 DIAGNOSIS — T80219A Unspecified infection due to central venous catheter, initial encounter: Secondary | ICD-10-CM | POA: Diagnosis not present

## 2020-06-04 DIAGNOSIS — E669 Obesity, unspecified: Secondary | ICD-10-CM | POA: Diagnosis not present

## 2020-06-04 DIAGNOSIS — Z96649 Presence of unspecified artificial hip joint: Secondary | ICD-10-CM

## 2020-06-04 DIAGNOSIS — J9601 Acute respiratory failure with hypoxia: Secondary | ICD-10-CM | POA: Diagnosis not present

## 2020-06-04 DIAGNOSIS — M9702XD Periprosthetic fracture around internal prosthetic left hip joint, subsequent encounter: Secondary | ICD-10-CM | POA: Diagnosis not present

## 2020-06-04 DIAGNOSIS — J8282 Acute eosinophilic pneumonia: Secondary | ICD-10-CM

## 2020-06-04 DIAGNOSIS — J8289 Other pulmonary eosinophilia, not elsewhere classified: Secondary | ICD-10-CM

## 2020-06-04 DIAGNOSIS — N401 Enlarged prostate with lower urinary tract symptoms: Secondary | ICD-10-CM | POA: Diagnosis not present

## 2020-06-04 DIAGNOSIS — M96842 Postprocedural seroma of a musculoskeletal structure following a musculoskeletal system procedure: Secondary | ICD-10-CM | POA: Diagnosis not present

## 2020-06-04 DIAGNOSIS — T50905A Adverse effect of unspecified drugs, medicaments and biological substances, initial encounter: Secondary | ICD-10-CM

## 2020-06-04 DIAGNOSIS — Z6834 Body mass index (BMI) 34.0-34.9, adult: Secondary | ICD-10-CM | POA: Diagnosis not present

## 2020-06-04 DIAGNOSIS — Z792 Long term (current) use of antibiotics: Secondary | ICD-10-CM | POA: Diagnosis not present

## 2020-06-04 LAB — BASIC METABOLIC PANEL
Anion gap: 9 (ref 5–15)
BUN: 50 mg/dL — ABNORMAL HIGH (ref 8–23)
CO2: 22 mmol/L (ref 22–32)
Calcium: 8.3 mg/dL — ABNORMAL LOW (ref 8.9–10.3)
Chloride: 109 mmol/L (ref 98–111)
Creatinine, Ser: 1.17 mg/dL (ref 0.61–1.24)
GFR, Estimated: >60 mL/min (ref >60)
Glucose, Bld: 147 mg/dL — ABNORMAL HIGH (ref 70–99)
Potassium: 4.1 mmol/L (ref 3.5–5.1)
Sodium: 140 mmol/L (ref 135–145)

## 2020-06-04 LAB — GLUCOSE, CAPILLARY
Glucose-Capillary: 126 mg/dL — ABNORMAL HIGH (ref 70–99)
Glucose-Capillary: 136 mg/dL — ABNORMAL HIGH (ref 70–99)
Glucose-Capillary: 142 mg/dL — ABNORMAL HIGH (ref 70–99)
Glucose-Capillary: 144 mg/dL — ABNORMAL HIGH (ref 70–99)
Glucose-Capillary: 146 mg/dL — ABNORMAL HIGH (ref 70–99)
Glucose-Capillary: 147 mg/dL — ABNORMAL HIGH (ref 70–99)
Glucose-Capillary: 151 mg/dL — ABNORMAL HIGH (ref 70–99)

## 2020-06-04 LAB — EXPECTORATED SPUTUM ASSESSMENT W GRAM STAIN, RFLX TO RESP C

## 2020-06-04 LAB — LEGIONELLA PNEUMOPHILA SEROGP 1 UR AG: L. pneumophila Serogp 1 Ur Ag: NEGATIVE

## 2020-06-04 LAB — RHEUMATOID FACTOR: Rheumatoid fact SerPl-aCnc: 18.4 IU/mL — ABNORMAL HIGH (ref <14.0)

## 2020-06-04 LAB — GLOMERULAR BASEMENT MEMBRANE ANTIBODIES: GBM Ab: 2 units (ref 0–20)

## 2020-06-04 LAB — CBC
HCT: 28.4 % — ABNORMAL LOW (ref 39.0–52.0)
Hemoglobin: 8.6 g/dL — ABNORMAL LOW (ref 13.0–17.0)
MCH: 24.8 pg — ABNORMAL LOW (ref 26.0–34.0)
MCHC: 30.3 g/dL (ref 30.0–36.0)
MCV: 81.8 fL (ref 80.0–100.0)
Platelets: 294 10*3/uL (ref 150–400)
RBC: 3.47 MIL/uL — ABNORMAL LOW (ref 4.22–5.81)
RDW: 18.3 % — ABNORMAL HIGH (ref 11.5–15.5)
WBC: 12.9 10*3/uL — ABNORMAL HIGH (ref 4.0–10.5)
nRBC: 0 % (ref 0.0–0.2)

## 2020-06-04 LAB — SAR COV2 SEROLOGY (COVID19)AB(IGG),IA: SARS-CoV-2 Ab, IgG: REACTIVE — AB

## 2020-06-04 LAB — ANTI-DNA ANTIBODY, DOUBLE-STRANDED: ds DNA Ab: <1 IU/mL (ref 0–9)

## 2020-06-04 LAB — PROCALCITONIN: Procalcitonin: 1.65 ng/mL

## 2020-06-04 NOTE — Progress Notes (Signed)
Pt stated that he could no longer tolerate the bipap, and stated that it needed to be taken off. Pt placed on HFNC @ 15 and NRB @ 15, pt sats remain stable at this time. Provider notified via [place].

## 2020-06-04 NOTE — Consult Note (Signed)
NAME:  Jacob Mitchell, MRN:  [id number], DOB:  [birth year], LOS: 4 ADMISSION DATE:  05/31/2020, CONSULTATION DATE:  06/03/20 REFERRING MD:  Dr Marva Panda, CHIEF COMPLAINT:  Acute hypoxemic resp failure in setting of daptiomycin and left hip sepsi   History of Present Illness:   75 year old obese male, non-smoker but.  Has a history of left hip prosthesis that has been complicated by multiple revisions due to subluxation and recurrent left hip seroma.  He had a fall in November 2021 with a left trochanteric femur fracture and status post ORIF in December 2021.  In early January 2022 , pre-op COVID-19 test was POS.  Subsequently had a second ORIF in mid January 2022 due to periprosthetic fracture.  Recommend incision and drainage on May 01, 2020.  At this point in time placed on daptomycin and ceftriaxone for 6 weeks upper extremity PICC line.  Admitted on 05/31/2020 [1 month into daptomycin and ceftriaxone treatment] with clinically left PICC line related suspected cellulitis but also cough and shortness of breath [he also had cough even before daptomycin] but the cough is worsened over 2 weeks prior to admission.  Left upper extremity duplex negative for DVT at admission.  CT angiogram chest negative for pulmonary embolus but showed mild cardiomegaly and bilateral small pleural effusions with bilateral patchy groundglass opacities especially in the lower lobe.  Respiratory virus panel negative.  Seen by infectious disease consultation and orthopedics 06/01/2020 and antibiotics changed.  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  Course since admission  -Appears that entry into the ER was 91% on room air.  Somewhere along the way he was on 2 L nasal cannula.  But on the evening of 06/02/2020 he started requiring more oxygen and transferred to stepdown.  On the morning of 06/03/2020 was getting somnolent and requiring 15 L nasal cannula with easy desaturations and significant tachypnea.  Critical care  medicine ordered BiPAP and he had rapid improvement in mental status.  Pulse ox 99% on 60% FiO2 on BiPAP   Antibiotic history   Daptomycin 3/24 >> (stopped4/20/22, intent was to stop 06/14/20) Ceftriaxone 3/24 >> (Stopped 05/30/20, intent was to stop 06/14/20) xxxx Vanc 4/21>> Cefepime 4/21 >> Azithro 4/23  >> (4/28)   Peripheral eos hx  Results for Jacob Mitchell, Jacob Mitchell (MRN [id number]) as of 06/03/2020 08:29  Ref. Range 01/17/2020 12:20 01/17/2020 14:56 01/17/2020 15:37 01/18/2020 03:28 01/19/2020 03:30 02/07/2020 08:46 02/27/2020 09:50 02/27/2020 13:39 02/28/2020 03:39 02/29/2020 03:33 05/01/2020 13:51 05/01/2020 17:29 05/02/2020 08:55 05/03/2020 08:37 05/04/2020 09:15 05/31/2020 17:42 06/01/2020 03:21 06/02/2020 06:07 06/03/2020 03:21  Eosinophils Absolute Latest Ref Range: 0.0 - 0.5 K/uL                0.3  0.0     Pertinent  Medical History     has a past medical history of Benign localized prostatic hyperplasia with lower urinary tract symptoms (LUTS), Complication of anesthesia, Diverticulosis of colon, History of colon polyps, History of kidney stones, History of MRSA infection (2007), History of urinary retention (05/31/2017), Incomplete emptying of bladder, OA (osteoarthritis), Peripheral neuropathy, Pinched nerve in neck, Wears glasses, and Wears hearing aid in both ears.   reports that he has never smoked. He has never used smokeless tobacco. Current Facility-Administered Medications:  .  0.9 %  sodium chloride infusion, , Intravenous, PRN, Harold Hedge, MD, Stopped at 06/04/20 0601 .  acetaminophen (TYLENOL) tablet 650 mg, 650 mg, Oral, Q6H PRN **OR** acetaminophen (TYLENOL) suppository 650 mg, 650 mg, Rectal,  Q6H PRN, Elwyn Reach, MD .  azithromycin (ZITHROMAX) 500 mg in sodium chloride 0.9 % 250 mL IVPB, 500 mg, Intravenous, Q24H, Harold Hedge, MD, Stopped at 06/03/20 1245 .  benzonatate (TESSALON) capsule 200 mg, 200 mg, Oral, TID PRN, Blount, Xenia T, NP, 200 mg at 06/03/20 0256 .   ceFEPIme (MAXIPIME) 2 g in sodium chloride 0.9 % 100 mL IVPB, 2 g, Intravenous, Q8H, Angela Adam, RPH, Last Rate: 200 mL/hr at 06/04/20 0820, 2 g at 06/04/20 0820 .  Chlorhexidine Gluconate Cloth 2 % PADS 6 each, 6 each, Topical, Daily, Harold Hedge, MD, 6 each at 06/03/20 1107 .  dextromethorphan-guaiFENesin (MUCINEX DM) 30-600 MG per 12 hr tablet 1 tablet, 1 tablet, Oral, BID, Blount, Xenia T, NP, 1 tablet at 06/03/20 2111 .  enoxaparin (LOVENOX) injection 75 mg, 75 mg, Subcutaneous, Q24H, Harold Hedge, MD, 75 mg at 06/03/20 1137 .  HYDROcodone-homatropine (HYCODAN) 5-1.5 MG/5ML syrup 5 mL, 5 mL, Oral, Q4H PRN, Charlesetta Shanks, MD, 5 mL at 06/02/20 1821 .  insulin aspart (novoLOG) injection 3-9 Units, 3-9 Units, Subcutaneous, Q4H, Brand Males, MD, 3 Units at 06/04/20 [phone] .  ipratropium-albuterol (DUONEB) 0.5-2.5 (3) MG/3ML nebulizer solution 3 mL, 3 mL, Nebulization, Q2H PRN, Pfeiffer, Marcy, MD, 3 mL at 06/03/20 0300 .  methylPREDNISolone sodium succinate (SOLU-MEDROL) 125 mg/2 mL injection 125 mg, 125 mg, Intravenous, Q6H, Ramaswamy, Murali, MD, 125 mg at 06/04/20 0556 .  ondansetron (ZOFRAN) tablet 4 mg, 4 mg, Oral, Q6H PRN **OR** ondansetron (ZOFRAN) injection 4 mg, 4 mg, Intravenous, Q6H PRN, Garba, Mohammad L, MD .  phenol (CHLORASEPTIC) mouth spray 1 spray, 1 spray, Mouth/Throat, PRN, Harold Hedge, MD, 1 spray at 06/02/20 1044 .  vancomycin (VANCOREADY) IVPB 1000 mg/200 mL, 1,000 mg, Intravenous, Q12H, Emiliano Dyer, RPH, Last Rate: 200 mL/hr at 06/04/20 [id number], Infusion Verify at 06/04/20 0644   Significant Hospital Events: Including procedures, antibiotic start and stop dates in addition to other pertinent events   . 05/31/2020 - admit.  91% oxygen on room air . 06/01/2020: 91% oxygen on room air.  Dr. Alvan Dame orthopedic consult: Recurrent left hip seroma.  Antibiotic plan per infectious diseases.  If no IV antibiotic planned and recommended doxycycline for suppression for  6 months at least and consider IR drain to the left hip o Left upper extremity PICC line removed . 4/22 ID consut - recopmmend dc pic line due to cellulitis concern and marked upper extremity edema and follow-up blood cultures.  Consider you cephalic pneumonia. . 4/23 - speech eval -regular diet, think liquids    Interim History / Subjective:   Taken off BiPAP around 3 AM and stayed off till change of shift when he was placed back on BiPAP. Complains of dry cough. "When can I eat"  Objective   Blood pressure (!) 132/59, pulse 66, temperature (!) 97 F (36.1 C), temperature source Axillary, resp. rate (!) 32, height 6\' 7"  (2.007 m), weight (!) 149.4 kg, SpO2 93 %.    FiO2 (%):  [40 %-60 %] 50 %   Intake/Output Summary (Last 24 hours) at 06/04/2020 0918 Last data filed at 06/04/2020 0644 Gross per 24 hour  Intake 1483.38 ml  Output 800 ml  Net 683.38 ml   Filed Weights   05/31/20 1741 06/02/20 2100  Weight: 136.1 kg (!) 149.4 kg    Examination: General: Obese male on BiPAP, large full facemask HENT: BiPAP present Lungs: No accessory muscle use, no crackles or rhonchi Cardiovascular:  Sinus tachycardia Abdomen: Obese and soft nontender Extremities: No cyanosis no clubbing no edema Neuro: Alert and interactive, can speak in full sentences GU: Not examined  Labs/imaging that I havepersonally reviewed  (right click and "Reselect all SmartList Selections" daily)   Chest x-ray 4/23 shows moderate patchy bilateral interstitial airspace disease. Small effusions.  CTA chest 4/21 small bilateral effusions, bilateral patchy groundglass opacity?  Multilobar pneumonia.  CT chest without contrast 4/24 persistent bilateral multifocal and groundglass opacities, no effusions  Echo 4/24 >> nml LVEF , gr 1 DD, asc aorta 66mm  Resolved Hospital Problem list     Assessment & Plan:   Acute Hypoxemic Respiratory Failure - severe, needing BiPAP and high fio2.  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  -DD includes COVID lung but he was asymptomatic when he had positive preop testing, COVID IgG positive which may be due to prior infection, clarified that he is vaccinated and boosted  Plan  - Empiric Solu-Medrol 125 every 6 has been started, will continue and monitor sugars -Continue BiPAP for now , try to give breaks every 4-6 hours, keep on liquid diet    - rule out other etiologies    - await urine strep, urine legionella    - steroid safety  - check G6PDH - in case he needs bactrim  - check Quantiferon gold for completion  - start ICU hyperglycemia protocool     He DOES NOT need isolation for TB or COVID   Full code   ATTESTATION & SIGNATURE   Kara Mead MD. Shade Flood. [facility] Pager : [phone]  If no response to pager , please call [phone] until 7 pm After 7:00 pm call Elink  [phone]     06/04/2020 9:18 AM    LABS    PULMONARY Recent Labs  Lab 06/02/20 2020 06/03/20 0540  PHART 7.383 7.367  PCO2ART 34.8 40.1  PO2ART 75.8* 70.1*  HCO3 20.3 22.5  O2SAT 94.2 92.7    CBC Recent Labs  Lab 06/03/20 0321 06/03/20 1122 06/04/20 0342  HGB 8.7* 8.7* 8.6*  HCT 29.7* 29.4* 28.4*  WBC 20.2* 18.3* 12.9*  PLT 355 322 294    COAGULATION Recent Labs  Lab 05/31/20 2026 06/01/20 0321  INR 1.2 1.3*    CARDIAC  No results for input(s): TROPONINI in the last 168 hours. No results for input(s): PROBNP in the last 168 hours.   CHEMISTRY Recent Labs  Lab 05/31/20 1742 06/01/20 0321 06/02/20 0607 06/03/20 0321 06/04/20 0342  NA 143 136 140 137 140  K 4.0 3.9 4.4 4.0 4.1  CL 107 105 106 104 109  CO2 24  22 21* 23 22  GLUCOSE 110* 109* 107* 129* 147*  BUN 27* 30* 38* 45* 50*  CREATININE 1.46* 1.35* 1.45* 1.36* 1.17  CALCIUM 8.3* 8.1* 8.4* 8.2* 8.3*   Estimated Creatinine Clearance: 88.1 mL/min (by C-G formula based on SCr of 1.17 mg/dL).   LIVER Recent Labs  Lab 05/31/20 2026 06/01/20 0321  AST 26 23  ALT 20 18  ALKPHOS 126 107  BILITOT 0.5 0.5  PROT 7.0 6.0*  ALBUMIN 3.1* 2.8*  INR  1.2 1.3*     INFECTIOUS Recent Labs  Lab 05/31/20 2026 05/31/20 2230 06/01/20 0321 06/03/20 1122 06/04/20 0342  LATICACIDVEN 2.0* 1.5  --   --   --   PROCALCITON  --   --  0.43 2.55 1.65     ENDOCRINE CBG (last 3)  Recent Labs    06/04/20 0054 06/04/20 0331 06/04/20 0858  GLUCAP 144* 142* 147*      Kara Mead MD. FCCP. [facility] Pager : [phone]  If no response to pager , please call [phone] until 7 pm After 7:00 pm call Elink  [phone]   06/04/2020

## 2020-06-04 NOTE — Progress Notes (Signed)
SLP Cancellation Note  Patient Details Name: Jacob Mitchell MRN: [id number] DOB: Nov 04, 1945   Cancelled treatment:       Reason Eval/Treat Not Completed: Medical issues which prohibited therapy. Patient unfortunately is suffering from severe acute hypoxemic respiratory failure requiring BiPAP to maintain oxygen saturations in 90%'s. SLP to s/o at this time but please reorder SLP swallow evaluation if needed to r/o silent aspiration when patient's respiratory conditions improves. Thank you for this consult!   Sonia Baller, MA, CCC-SLP Speech Therapy

## 2020-06-04 NOTE — Progress Notes (Signed)
Removed PT from BiPAP and placed on 15 LPM NRB. RN aware.

## 2020-06-04 NOTE — Progress Notes (Addendum)
Subjective: No new complaints, but concerned re how long swelling in arm will take to go down  Antibiotics:  Anti-infectives (From admission, onward)   Start     Dose/Rate Route Frequency Ordered Stop   06/03/20 1800  vancomycin (VANCOREADY) IVPB 1000 mg/200 mL        1,000 mg 200 mL/hr over 60 Minutes Intravenous Every 12 hours 06/03/20 0838     06/03/20 1030  azithromycin (ZITHROMAX) 500 mg in sodium chloride 0.9 % 250 mL IVPB        500 mg 250 mL/hr over 60 Minutes Intravenous Every 24 hours 06/03/20 0931 06/07/20 1029   06/03/20 1000  azithromycin (ZITHROMAX) tablet 250 mg  Status:  Discontinued       "Followed by" Linked Group Details   250 mg Oral Daily 06/02/20 0942 06/03/20 0931   06/02/20 1030  azithromycin (ZITHROMAX) tablet 500 mg       "Followed by" Linked Group Details   500 mg Oral Daily 06/02/20 0942 06/02/20 1042   06/02/20 0000  vancomycin (VANCOREADY) IVPB 1500 mg/300 mL  Status:  Discontinued        1,500 mg 150 mL/hr over 120 Minutes Intravenous Every 24 hours 06/01/20 0129 06/03/20 0838   06/01/20 0800  ceFEPIme (MAXIPIME) 2 g in sodium chloride 0.9 % 100 mL IVPB        2 g 200 mL/hr over 30 Minutes Intravenous Every 8 hours 06/01/20 0129     06/01/20 0400  vancomycin (VANCOREADY) IVPB 1000 mg/200 mL  Status:  Discontinued        1,000 mg 200 mL/hr over 60 Minutes Intravenous  Once 06/01/20 0300 06/01/20 0307   06/01/20 0400  ceFEPIme (MAXIPIME) 2 g in sodium chloride 0.9 % 100 mL IVPB  Status:  Discontinued        2 g 200 mL/hr over 30 Minutes Intravenous  Once 06/01/20 0300 06/01/20 0307   05/31/20 2245  vancomycin (VANCOCIN) IVPB 1000 mg/200 mL premix  Status:  Discontinued        1,000 mg 200 mL/hr over 60 Minutes Intravenous  Once 05/31/20 2232 05/31/20 2235   05/31/20 2245  ceFEPIme (MAXIPIME) 2 g in sodium chloride 0.9 % 100 mL IVPB        2 g 200 mL/hr over 30 Minutes Intravenous  Once 05/31/20 2232 06/01/20 0132   05/31/20 2245   vancomycin (VANCOREADY) IVPB 2000 mg/400 mL        2,000 mg 200 mL/hr over 120 Minutes Intravenous  Once 05/31/20 2235 06/01/20 0233      Medications: Scheduled Meds: . Chlorhexidine Gluconate Cloth  6 each Topical Daily  . dextromethorphan-guaiFENesin  1 tablet Oral BID  . enoxaparin (LOVENOX) injection  75 mg Subcutaneous Q24H  . insulin aspart  3-9 Units Subcutaneous Q4H  . methylPREDNISolone (SOLU-MEDROL) injection  125 mg Intravenous Q6H   Continuous Infusions: . sodium chloride 10 mL/hr at 06/04/20 1052  . azithromycin Stopped (06/04/20 1051)  . ceFEPime (MAXIPIME) IV Stopped (06/04/20 0850)  . vancomycin Stopped (06/04/20 0707)   PRN Meds:.sodium chloride, acetaminophen **OR** acetaminophen, benzonatate, HYDROcodone-homatropine, ipratropium-albuterol, ondansetron **OR** ondansetron (ZOFRAN) IV, phenol    Objective: Weight change:   Intake/Output Summary (Last 24 hours) at 06/04/2020 1239 Last data filed at 06/04/2020 1052 Gross per 24 hour  Intake 1312.89 ml  Output 800 ml  Net 512.89 ml   Blood pressure (!) 119/57, pulse 70, temperature (!) 96.7 F (35.9 C), temperature source Axillary,  resp. rate (!) 23, height 6\' 7"  (2.007 m), weight (!) 149.4 kg, SpO2 98 %. Temp:  [96.7 F (35.9 C)-98.9 F (37.2 C)] 96.7 F (35.9 C) (04/25 1216) Pulse Rate:  [42-145] 70 (04/25 1159) Resp:  [15-38] 23 (04/25 1159) BP: (108-132)/(51-68) 119/57 (04/25 1052) SpO2:  [88 %-98 %] 98 % (04/25 1052) FiO2 (%):  [50 %-95 %] 95 % (04/25 1159)  Physical Exam: Physical Exam Constitutional:      Appearance: He is well-developed.  HENT:     Head: Normocephalic and atraumatic.  Eyes:     Conjunctiva/sclera: Conjunctivae normal.  Cardiovascular:     Rate and Rhythm: Regular rhythm. Tachycardia present.  Pulmonary:     Breath sounds: No wheezing.  Abdominal:     General: There is no distension.     Palpations: Abdomen is soft.  Musculoskeletal:        General: Normal range of  motion.     Cervical back: Normal range of motion and neck supple.  Skin:    General: Skin is warm and dry.     Findings: No erythema or rash.  Neurological:     General: No focal deficit present.     Mental Status: He is alert and oriented to person, place, and time.  Psychiatric:        Mood and Affect: Mood normal.        Behavior: Behavior normal.        Thought Content: Thought content normal.        Judgment: Judgment normal.      CBC:    BMET Recent Labs    06/03/20 0321 06/04/20 0342  NA 137 140  K 4.0 4.1  CL 104 109  CO2 23 22  GLUCOSE 129* 147*  BUN 45* 50*  CREATININE 1.36* 1.17  CALCIUM 8.2* 8.3*     Liver Panel  No results for input(s): PROT, ALBUMIN, AST, ALT, ALKPHOS, BILITOT, BILIDIR, IBILI in the last 72 hours.     Sedimentation Rate Recent Labs    06/03/20 1122  ESRSEDRATE 86*   C-Reactive Protein No results for input(s): CRP in the last 72 hours.  Micro Results: Recent Results (from the past 720 hour(s))  Culture, blood (routine x 2)     Status: None (Preliminary result)   Collection Time: 05/31/20  7:58 PM   Specimen: BLOOD RIGHT HAND  Result Value Ref Range Status   Specimen Description   Final    BLOOD RIGHT HAND Performed at [facility] [address], [place], [place] [address]    Special Requests   Final    BOTTLES DRAWN AEROBIC AND ANAEROBIC Blood Culture adequate volume Performed at [facility] [address] [place], [place] [address]    Culture   Final    NO GROWTH 3 DAYS Performed at [facility], [place] [address], [place], [place] [address]    Report Status PENDING  Incomplete  Culture, blood (Routine X 2) w Reflex to ID Panel     Status: None (Preliminary result)   Collection Time: 05/31/20  8:16 PM   Specimen: BLOOD  Result Value Ref Range Status   Specimen Description   Final    BLOOD RIGHT ANTECUBITAL Performed at [facility]  [address], [place], [place] [address]    Special Requests   Final    BOTTLES DRAWN AEROBIC AND ANAEROBIC Blood Culture results may not be optimal due to an excessive volume  of blood received in culture bottles Performed at [facility], [place] [address] [place], [place] [address]    Culture   Final    NO GROWTH 3 DAYS Performed at [facility], [place] [address] [place], [place] [address]    Report Status PENDING  Incomplete  Culture, blood (routine x 2)     Status: None (Preliminary result)   Collection Time: 05/31/20  8:26 PM   Specimen: BLOOD RIGHT HAND  Result Value Ref Range Status   Specimen Description   Final    BLOOD RIGHT HAND Performed at [facility] [address], [place], [place] [address]    Special Requests   Final    BOTTLES DRAWN AEROBIC ONLY Blood Culture results may not be optimal due to an excessive volume of blood received in culture bottles Performed at [facility] [address] [place], [place] [address]    Culture   Final    NO GROWTH 3 DAYS Performed at [facility], [place] [address], [place], [place] [address]    Report Status PENDING  Incomplete  Respiratory (~20 pathogens) panel by PCR     Status: None   Collection Time: 05/31/20 10:02 PM   Specimen: Nasopharyngeal Swab; Respiratory  Result Value Ref Range Status   Adenovirus NOT DETECTED NOT DETECTED Final   Coronavirus 229E NOT DETECTED NOT DETECTED Final    Comment: (NOTE) The Coronavirus on the Respiratory Panel, DOES NOT test for the novel  Coronavirus (2019 nCoV)    Coronavirus HKU1 NOT DETECTED NOT DETECTED Final   Coronavirus NL63 NOT DETECTED NOT DETECTED Final   Coronavirus OC43 NOT DETECTED NOT DETECTED Final   Metapneumovirus NOT DETECTED NOT DETECTED Final   Rhinovirus / Enterovirus NOT DETECTED NOT DETECTED Final   Influenza A NOT DETECTED NOT DETECTED Final   Influenza B NOT DETECTED NOT DETECTED Final    Parainfluenza Virus 1 NOT DETECTED NOT DETECTED Final   Parainfluenza Virus 2 NOT DETECTED NOT DETECTED Final   Parainfluenza Virus 3 NOT DETECTED NOT DETECTED Final   Parainfluenza Virus 4 NOT DETECTED NOT DETECTED Final   Respiratory Syncytial Virus NOT DETECTED NOT DETECTED Final   Bordetella pertussis NOT DETECTED NOT DETECTED Final   Bordetella Parapertussis NOT DETECTED NOT DETECTED Final   Chlamydophila pneumoniae NOT DETECTED NOT DETECTED Final   Mycoplasma pneumoniae NOT DETECTED NOT DETECTED Final    Comment: Performed at [facility], [place]. [address], [place], [place] [address]  Expectorated Sputum Assessment w Gram Stain, Rflx to Resp Cult     Status: None (Preliminary result)   Collection Time: 06/02/20  9:31 AM   Specimen: Sputum  Result Value Ref Range Status   Specimen Description SPUTUM  Final   Special Requests NONE  Final   Sputum evaluation   Final    THIS SPECIMEN IS ACCEPTABLE FOR SPUTUM CULTURE Performed at [facility], [place] [address] [place], [place] [address]    Report Status PENDING  Incomplete  Culture, Respiratory w Gram Stain     Status: None (Preliminary result)   Collection Time: 06/02/20  9:31 AM   Specimen: SPU  Result Value Ref Range Status   Specimen Description   Final    SPUTUM Performed at [facility] [address] [place], [place] [address]    Special Requests   Final    NONE Reflexed from [phone] Performed at [facility], [address] [place], [place] [address]    Gram Stain   Final    RARE WBC PRESENT,BOTH PMN AND MONONUCLEAR FEW YEAST FEW GRAM POSITIVE COCCI IN PAIRS RARE GRAM NEGATIVE RODS    Culture   Final    TOO YOUNG TO READ Performed at [facility], [place] [address] [place], [place] [address]    Report Status PENDING  Incomplete  MRSA PCR Screening     Status: None   Collection Time: 06/02/20  9:59 PM   Specimen: Nasal Mucosa; Nasopharyngeal   Result Value Ref Range Status   MRSA by PCR NEGATIVE NEGATIVE Final    Comment:        The GeneXpert MRSA Assay (FDA approved for NASAL specimens only), is one component of a comprehensive MRSA colonization surveillance program. It is not intended to diagnose MRSA infection nor to guide or monitor treatment for MRSA infections. Performed at [facility], [place] [address], [place], [place] [address]     Studies/Results: CT CHEST WO CONTRAST  Result Date: 06/03/2020 CLINICAL DATA:  Follow-up pneumonia. EXAM: CT CHEST WITHOUT CONTRAST TECHNIQUE: Multidetector CT imaging of the chest was performed following the standard protocol without IV contrast. COMPARISON:  05/31/2020 FINDINGS: Cardiovascular: Heart size appears within normal limits. No pericardial effusion. Aortic atherosclerosis. Coronary artery calcifications. Mediastinum/Nodes: Prominent mediastinal lymph nodes are identified, none of which meet CT criteria for adenopathy. This likely reflects reactive change. Thyroid gland, trachea, and esophagus demonstrate no significant findings. Lungs/Pleura: Trace pleural fluid identified overlying the posterior lung bases. Bilateral multifocal airspace and ground-glass opacities are again noted identified compatible with multifocal pneumonia. This is most severe within the posterolateral right lower lobe, anterolateral left lower lobe and periphery of the right upper lobe. Compared with the previous exam there is been progressive ground-glass opacification bilaterally. Upper Abdomen: Bilateral kidney cysts are noted. Incompletely characterized without IV contrast. Low-attenuation nodule in the right adrenal gland measures -4 Hounsfield units and is compatible with a benign adenoma. Musculoskeletal: No chest wall mass or suspicious bone lesions identified. Thoracic degenerative disc disease is noted. IMPRESSION: 1. Persistent bilateral multifocal airspace and ground-glass opacities  compatible with multifocal pneumonia. Compared with the previous exam there has been progressive ground-glass opacification bilaterally. 2. No evidence for clinically significant pleural effusion or pulmonary abscess. 3. Aortic atherosclerosis. Coronary artery calcifications noted. 4. Right adrenal gland adenoma. Aortic Atherosclerosis (ICD10-I70.0). Electronically Signed   By: Kerby Moors M.D.   On: 06/03/2020 10:27   DG CHEST PORT 1 VIEW  Result Date: 06/02/2020 CLINICAL DATA:  Coughing, low accident saturation. EXAM: PORTABLE CHEST 1 VIEW COMPARISON:  Chest radiograph dated 05/14/2016, CT chest dated 05/31/2020. FINDINGS: The heart is enlarged. Moderate patchy bilateral interstitial and airspace opacities, right greater than left, are noted. Small bilateral pleural effusions are present. No pneumothorax. Degenerative changes are seen in the spine and shoulders. IMPRESSION: 1. Moderate patchy bilateral interstitial and airspace opacities, right greater than left, suspicious for multifocal pneumonia and/or pulmonary edema. 2. Small bilateral pleural effusions. Electronically Signed   By: Zerita Boers M.D.   On: 06/02/2020 20:26   ECHOCARDIOGRAM COMPLETE  Result Date: 06/03/2020    ECHOCARDIOGRAM REPORT   Patient Name:   Jacob Mitchell Date of Exam: 06/03/2020 Medical Rec #:  [id number]    Height:       79.0 in Accession #:    [id number]   Weight:       329.4 lb Date of Birth:  1945/06/12  BSA:          2.817 m Patient Age:    75 years     BP:           123/64 mmHg Patient Gender: M            HR:           73 bpm. Exam Location:  Inpatient Procedure: 2D Echo, Cardiac Doppler, Color Doppler and Intracardiac            Opacification Agent Indications:    A[place]re respiratory distress R06.03  History:        Patient has no prior history of Echocardiogram examinations.                 Sepsis. Acute hypoxemic respiratory failure.  Sonographer:    Darlina Sicilian RDCS Referring Phys: [phone] [facility]   Sonographer Comments: Technically difficult study due to poor echo windows. On BiPap IMPRESSIONS  1. Technically difficult study, limited views even with contrast. Left ventricular ejection fraction, by estimation, is 50 to 55%. The left ventricle has grossly low normal function. Left ventricular endocardial border not optimally defined to evaluate regional wall motion. There is mild left ventricular hypertrophy. Left ventricular diastolic parameters are consistent with Grade I diastolic dysfunction (impaired relaxation).  2. Right ventricular systolic function is normal. The right ventricular size is mildly enlarged. Tricuspid regurgitation signal is inadequate for assessing PA pressure.  3. The mitral valve is grossly normal. No evidence of mitral valve regurgitation. No evidence of mitral stenosis.  4. The aortic valve was not well visualized. Aortic valve regurgitation is not visualized. No aortic stenosis is present.  5. Aortic dilatation noted. Aneurysm of the ascending aorta, measuring 45 mm. There is mild dilatation of the aortic root, measuring 41 mm. FINDINGS  Left Ventricle: Left ventricular ejection fraction, by estimation, is 50 to 55%. The left ventricle has low normal function. Left ventricular endocardial border not optimally defined to evaluate regional wall motion. Definity contrast agent was given IV  to delineate the left ventricular endocardial borders. The left ventricular internal cavity size was normal in size. There is mild left ventricular hypertrophy. Left ventricular diastolic parameters are consistent with Grade I diastolic dysfunction (impaired relaxation). Right Ventricle: The right ventricular size is mildly enlarged. No increase in right ventricular wall thickness. Right ventricular systolic function is normal. Tricuspid regurgitation signal is inadequate for assessing PA pressure. Left Atrium: Left atrial size was normal in size. Right Atrium: Right atrial size was normal in size.  Pericardium: Trivial pericardial effusion is present. Mitral Valve: The mitral valve is grossly normal. No evidence of mitral valve regurgitation. No evidence of mitral valve stenosis. Tricuspid Valve: The tricuspid valve is normal in structure. Tricuspid valve regurgitation is not demonstrated. Aortic Valve: The aortic valve was not well visualized. Aortic valve regurgitation is not visualized. No aortic stenosis is present. Pulmonic Valve: The pulmonic valve was not well visualized. Pulmonic valve regurgitation is not visualized. Aorta: Aortic dilatation noted. There is mild dilatation of the aortic root, measuring 41 mm. There is an aneurysm involving the ascending aorta measuring 45 mm. IAS/Shunts: The interatrial septum was not well visualized.  LEFT VENTRICLE PLAX 2D LVIDd:         5.50 cm  Diastology LVIDs:         4.00 cm  LV e' medial:    6.53 cm/s LV PW:         1.30 cm  LV E/e' medial:  7.7 LV IVS:        1.30 cm  LV e' lateral:   12.90 cm/s LVOT diam:     2.50 cm  LV E/e' lateral: 3.9 LVOT Area:     4.91 cm  RIGHT VENTRICLE RV S prime:     27.10 cm/s TAPSE (M-mode): 2.3 cm LEFT ATRIUM             Index       RIGHT ATRIUM           Index LA diam:        4.00 cm 1.42 cm/m  RA Area:     17.40 cm LA Vol (A2C):   69.7 ml 24.74 ml/m RA Volume:   38.80 ml  13.77 ml/m LA Vol (A4C):   64.5 ml 22.90 ml/m LA Biplane Vol: 68.5 ml 24.32 ml/m   AORTA Ao Root diam: 4.10 cm Ao Asc diam:  4.50 cm MITRAL VALVE MV Area (PHT): 1.72 cm    SHUNTS MV Decel Time: 440 msec    Systemic Diam: 2.50 cm MV E velocity: 50.60 cm/s MV A velocity: 83.60 cm/s MV E/A ratio:  0.61 Oswaldo Milian MD Electronically signed by Oswaldo Milian MD Signature Date/Time: 06/03/2020/12:57:36 PM    Final       Assessment/Plan:  INTERVAL HISTORY: remains on bipap   Principal Problem:   Sepsis ([place]) Active Problems:   S/P left THA, AA   S/P revision of total hip   Obese   HCAP (healthcare-associated pneumonia)    Cellulitis   Acute hypoxemic respiratory failure (HCC)    Jacob Mitchell is a 75 y.o. 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 He was changed to vancomycin and cefepime. 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  He is on steroids, vancomycin and cefepime and azithromycin  #2 PJI: once he recovers from #1 would likely complete IV abx with vancomycin + ceftriaxone vs doxycyline and ceftriaxone followed by po antibiotics  #3 Left arm edema where PICC was placed seems to have had infiltration of soft tissues and no DVT    LOS: 4 days   Alcide Evener 06/04/2020, 12:39 PM

## 2020-06-05 DIAGNOSIS — J984 Other disorders of lung: Secondary | ICD-10-CM

## 2020-06-05 DIAGNOSIS — T8459XA Infection and inflammatory reaction due to other internal joint prosthesis, initial encounter: Secondary | ICD-10-CM | POA: Diagnosis not present

## 2020-06-05 DIAGNOSIS — T80219A Unspecified infection due to central venous catheter, initial encounter: Secondary | ICD-10-CM | POA: Diagnosis not present

## 2020-06-05 DIAGNOSIS — J9601 Acute respiratory failure with hypoxia: Secondary | ICD-10-CM | POA: Diagnosis not present

## 2020-06-05 DIAGNOSIS — A419 Sepsis, unspecified organism: Secondary | ICD-10-CM | POA: Diagnosis not present

## 2020-06-05 LAB — CULTURE, RESPIRATORY W GRAM STAIN

## 2020-06-05 LAB — CBC
HCT: 29.5 % — ABNORMAL LOW (ref 39.0–52.0)
Hemoglobin: 8.8 g/dL — ABNORMAL LOW (ref 13.0–17.0)
MCH: 24.8 pg — ABNORMAL LOW (ref 26.0–34.0)
MCHC: 29.8 g/dL — ABNORMAL LOW (ref 30.0–36.0)
MCV: 83.1 fL (ref 80.0–100.0)
Platelets: 339 10*3/uL (ref 150–400)
RBC: 3.55 MIL/uL — ABNORMAL LOW (ref 4.22–5.81)
RDW: 18.2 % — ABNORMAL HIGH (ref 11.5–15.5)
WBC: 18.7 10*3/uL — ABNORMAL HIGH (ref 4.0–10.5)
nRBC: 0 % (ref 0.0–0.2)

## 2020-06-05 LAB — FANA STAINING PATTERNS: Speckled Pattern: 1:80 {titer}

## 2020-06-05 LAB — BASIC METABOLIC PANEL
Anion gap: 7 (ref 5–15)
BUN: 55 mg/dL — ABNORMAL HIGH (ref 8–23)
CO2: 22 mmol/L (ref 22–32)
Calcium: 8.3 mg/dL — ABNORMAL LOW (ref 8.9–10.3)
Chloride: 109 mmol/L (ref 98–111)
Creatinine, Ser: 1.11 mg/dL (ref 0.61–1.24)
GFR, Estimated: >60 mL/min (ref >60)
Glucose, Bld: 156 mg/dL — ABNORMAL HIGH (ref 70–99)
Potassium: 4.1 mmol/L (ref 3.5–5.1)
Sodium: 138 mmol/L (ref 135–145)

## 2020-06-05 LAB — GLUCOSE 6 PHOSPHATE DEHYDROGENASE
G6PDH: 12.5 U/g{Hb} (ref 4.8–15.7)
Hemoglobin: 8.7 g/dL — ABNORMAL LOW (ref 13.0–17.7)

## 2020-06-05 LAB — GLUCOSE, CAPILLARY
Glucose-Capillary: 129 mg/dL — ABNORMAL HIGH (ref 70–99)
Glucose-Capillary: 137 mg/dL — ABNORMAL HIGH (ref 70–99)
Glucose-Capillary: 145 mg/dL — ABNORMAL HIGH (ref 70–99)
Glucose-Capillary: 148 mg/dL — ABNORMAL HIGH (ref 70–99)
Glucose-Capillary: 159 mg/dL — ABNORMAL HIGH (ref 70–99)
Glucose-Capillary: 176 mg/dL — ABNORMAL HIGH (ref 70–99)

## 2020-06-05 LAB — ANTINUCLEAR ANTIBODIES, IFA: ANA Ab, IFA: POSITIVE — AB

## 2020-06-05 LAB — PROCALCITONIN: Procalcitonin: 0.94 ng/mL

## 2020-06-05 LAB — CYCLIC CITRUL PEPTIDE ANTIBODY, IGG/IGA: CCP Antibodies IgG/IgA: 5 units (ref 0–19)

## 2020-06-05 LAB — VANCOMYCIN, PEAK: Vancomycin Pk: 31 ug/mL (ref 30–40)

## 2020-06-05 MED ORDER — INSULIN DETEMIR 100 UNIT/ML ~~LOC~~ SOLN
10.0000 [IU] | Freq: Every day | SUBCUTANEOUS | Status: DC
  Administered 2020-06-05 – 2020-06-09 (×5): 10 [IU] via SUBCUTANEOUS
  Filled 2020-06-05 (×5): qty 0.1

## 2020-06-05 MED ORDER — TRAZODONE HCL 50 MG PO TABS
100.0000 mg | ORAL_TABLET | Freq: Every day | ORAL | Status: DC
  Administered 2020-06-05 – 2020-06-11 (×7): 100 mg via ORAL
  Filled 2020-06-05 (×7): qty 2

## 2020-06-05 NOTE — TOC Progression Note (Signed)
Transition of Care [facility]) - Progression Note    Patient Details  Name: Jacob Mitchell MRN: [id number] Date of Birth: [birth year]  Transition of Care [facility] CM/SW Contact  Leeroy Cha, RN Phone Number: 06/05/2020, 9:25 AM  Clinical Narrative:    Significant Hospital Events: Including procedures, antibiotic start and stop dates in addition to other pertinent events    05/31/2020 - admit.  91% oxygen on room air  06/01/2020: 91% oxygen on room air.  Dr. Alvan Dame orthopedic consult: Recurrent left hip seroma.  Antibiotic plan per infectious diseases.  If no IV antibiotic planned and recommended doxycycline for suppression for 6 months at least and consider IR drain to the left hip ? Left upper extremity PICC line removed  4/22 ID consut - recopmmend dc pic line due to cellulitis concern and marked upper extremity edema and follow-up blood cultures.  Consider you cephalic pneumonia.  4/23 - speech eval -regular diet, think liquids  PLAN: to returfn to home may need hhc services   Expected Discharge Plan: [place] Barriers to Discharge: No Barriers Identified  Expected Discharge Plan and Services Expected Discharge Plan: [place] arrangements for the past 2 months: Single Family Home                                       Social Determinants of Health (SDOH) Interventions    Readmission Risk Interventions Readmission Risk Prevention Plan 05/03/2020  Post Dischage Appt Complete  Medication Screening Complete  Transportation Screening Complete  Some recent data might be hidden

## 2020-06-05 NOTE — Progress Notes (Signed)
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  Has a history of left hip prosthesis that has been complicated by multiple revisions due to subluxation and recurrent left hip seroma.  He had a fall in November 2021 with a left trochanteric femur fracture and status post ORIF in December 2021.  In early January 2022 , pre-op COVID-19 test was POS.  Subsequently had a second ORIF in mid January 2022 due to periprosthetic fracture.  Recommend incision and drainage on May 01, 2020.  At this point in time placed on daptomycin and ceftriaxone for 6 weeks upper extremity PICC line.  Admitted on 05/31/2020 [1 month into daptomycin and ceftriaxone treatment] with clinically left PICC line related suspected cellulitis but also cough and shortness of breath [he also had cough even before daptomycin] but the cough is worsened over 2 weeks prior to admission.  Left upper extremity duplex negative for DVT at admission.  CT angiogram chest negative for pulmonary embolus but showed mild cardiomegaly and bilateral small pleural effusions with bilateral patchy groundglass opacities especially in the lower lobe.  Respiratory virus panel negative.  Seen by infectious disease consultation and orthopedics 06/01/2020 and antibiotics changed.  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  Course since admission  -Appears that entry into the ER was 91% on room air.  Somewhere along the way he was on 2 L nasal cannula.  But on the evening of 06/02/2020 he started requiring more oxygen and transferred to stepdown.  On the morning of 06/03/2020 was getting somnolent and requiring 15 L nasal cannula with easy desaturations and significant tachypnea.   Critical care medicine ordered BiPAP and he had rapid improvement in mental status.  Pulse ox 99% on 60% FiO2 on BiPAP   Antibiotic history   Daptomycin 3/24 >> (stopped4/20/22, intent was to stop 06/14/20) Ceftriaxone 3/24 >> (Stopped 05/30/20, intent was to stop 06/14/20) xxxx Vanc 4/21>> Cefepime 4/21 >> Azithro 4/23  >> (4/28)   Peripheral eos hx  Results for ENOS, MUHL (MRN [id number]) as of 06/03/2020 08:29  Ref. Range 01/17/2020 12:20 01/17/2020 14:56 01/17/2020 15:37 01/18/2020 03:28 01/19/2020 03:30 02/07/2020 08:46 02/27/2020 09:50 02/27/2020 13:39 02/28/2020 03:39 02/29/2020 03:33 05/01/2020 13:51 05/01/2020 17:29 05/02/2020 08:55 05/03/2020 08:37 05/04/2020 09:15 05/31/2020 17:42 06/01/2020 03:21 06/02/2020 06:07 06/03/2020 03:21  Eosinophils Absolute Latest Ref Range: 0.0 - 0.5 K/uL                0.3  0.0     Pertinent  Medical History     has a past medical history of Benign localized prostatic hyperplasia with lower urinary tract symptoms (LUTS), Complication of anesthesia, Diverticulosis of colon, History of colon polyps, History of kidney stones, History of MRSA infection (2007), History of urinary retention (05/31/2017), Incomplete emptying of bladder, OA (osteoarthritis), Peripheral neuropathy, Pinched nerve in neck, Wears glasses, and Wears hearing aid in both ears.   reports that he has never smoked. He has never used smokeless tobacco. Current Facility-Administered Medications:  .  0.9 %  sodium chloride infusion, , Intravenous, PRN, Harold Hedge, MD, Last Rate: 10 mL/hr at 06/05/20 0844, Infusion Verify at 06/05/20 0844 .  acetaminophen (TYLENOL) tablet 650 mg, 650 mg, Oral, Q6H  PRN **OR** acetaminophen (TYLENOL) suppository 650 mg, 650 mg, Rectal, Q6H PRN, Jonelle Sidle, Mohammad L, MD .  azithromycin (ZITHROMAX) 500 mg in sodium chloride 0.9 % 250 mL IVPB, 500 mg, Intravenous, Q24H, Harold Hedge, MD, Stopped at 06/04/20 1051 .  benzonatate (TESSALON) capsule 200 mg, 200 mg, Oral, TID  PRN, Blount, Xenia T, NP, 200 mg at 06/05/20 0117 .  ceFEPIme (MAXIPIME) 2 g in sodium chloride 0.9 % 100 mL IVPB, 2 g, Intravenous, Q8H, Angela Adam, RPH, Stopped at 06/05/20 0831 .  Chlorhexidine Gluconate Cloth 2 % PADS 6 each, 6 each, Topical, Daily, Harold Hedge, MD, 6 each at 06/05/20 [phone] .  dextromethorphan-guaiFENesin (MUCINEX DM) 30-600 MG per 12 hr tablet 1 tablet, 1 tablet, Oral, BID, Blount, Xenia T, NP, 1 tablet at 06/04/20 2033 .  enoxaparin (LOVENOX) injection 75 mg, 75 mg, Subcutaneous, Q24H, Harold Hedge, MD, 75 mg at 06/04/20 0946 .  HYDROcodone-homatropine (HYCODAN) 5-1.5 MG/5ML syrup 5 mL, 5 mL, Oral, Q4H PRN, Charlesetta Shanks, MD, 5 mL at 06/05/20 0244 .  insulin aspart (novoLOG) injection 3-9 Units, 3-9 Units, Subcutaneous, Q4H, Brand Males, MD, 3 Units at 06/05/20 0801 .  insulin detemir (LEVEMIR) injection 10 Units, 10 Units, Subcutaneous, Daily, Kara Mead V, MD .  ipratropium-albuterol (DUONEB) 0.5-2.5 (3) MG/3ML nebulizer solution 3 mL, 3 mL, Nebulization, Q2H PRN, Pfeiffer, Marcy, MD, 3 mL at 06/03/20 0300 .  methylPREDNISolone sodium succinate (SOLU-MEDROL) 125 mg/2 mL injection 125 mg, 125 mg, Intravenous, Q6H, Ramaswamy, Murali, MD, 125 mg at 06/05/20 0534 .  ondansetron (ZOFRAN) tablet 4 mg, 4 mg, Oral, Q6H PRN **OR** ondansetron (ZOFRAN) injection 4 mg, 4 mg, Intravenous, Q6H PRN, Jonelle Sidle, Mohammad L, MD .  phenol (CHLORASEPTIC) mouth spray 1 spray, 1 spray, Mouth/Throat, PRN, Harold Hedge, MD, 1 spray at 06/05/20 0117 .  vancomycin (VANCOREADY) IVPB 1000 mg/200 mL, 1,000 mg, Intravenous, Q12H, Emiliano Dyer, [facility], Stopped at 06/05/20 [id number]   Significant Hospital Events: Including procedures, antibiotic start and stop dates in addition to other pertinent events   . 05/31/2020 - admit.  91% oxygen on room air . 06/01/2020: 91% oxygen on room air.  Dr. Alvan Dame orthopedic consult: Recurrent left hip seroma.  Antibiotic plan per infectious diseases.  If  no IV antibiotic planned and recommended doxycycline for suppression for 6 months at least and consider IR drain to the left hip o Left upper extremity PICC line removed . 4/22 ID consut - recopmmend dc pic line due to cellulitis concern and marked upper extremity edema and follow-up blood cultures.  Consider you cephalic pneumonia. . 4/23 - speech eval -regular diet, think liquids . 4/25 off bipap 10pm    Interim History / Subjective:   Came off BiPAP around 10 PM last night and has stayed off. Requires high flow nasal cannula plus nonrebreather Breathing better. Still coughing but feels "like it is breaking up inside" Afebrile Good urine output  Objective   Blood pressure (!) 119/49, pulse 69, temperature 97.7 F (36.5 C), temperature source Oral, resp. rate (!) 30, height 6\' 7"  (2.007 m), weight (!) 149.4 kg, SpO2 93 %.    FiO2 (%):  [95 %] 95 %   Intake/Output Summary (Last 24 hours) at 06/05/2020 0908 Last data filed at 06/05/2020 0844 Gross per 24 hour  Intake 1312.96 ml  Output 1400 ml  Net -87.04 ml   Filed Weights   05/31/20 1741 06/02/20 2100  Weight: 136.1 kg (!) 149.4 kg    Examination: General:  Obese male on HFNC + NRB HENT: No pallor, no JVD Lungs: No accessory muscle use, no crackles or rhonchi Cardiovascular: Regular rate and rhythm Abdomen: Obese and soft nontender Extremities: No cyanosis no clubbing , 1+ weeping edema left forearm and hand Neuro: Alert and interactive, can speak in full sentences   Labs/imaging that I havepersonally reviewed  (right click and "Reselect all SmartList Selections" daily)   Labs show mild hyperglycemia, stable renal function, normal electrolytes, increase leukocytosis, stable anemia  Chest x-ray 4/23 shows moderate patchy bilateral interstitial airspace disease. Small effusions.  CTA chest 4/21 small bilateral effusions, bilateral patchy groundglass opacity?  Multilobar pneumonia.  CT chest without contrast 4/24  persistent bilateral multifocal and groundglass opacities, no effusions  Echo 4/24 >> nml LVEF , gr 1 DD, asc aorta 39mm  Urine Legionella negative Urine strep antigen negative  Resolved Hospital Problem list     Assessment & Plan:   Acute Hypoxemic Respiratory Failure - severe, needing BiPAP and high fio2.  -  mild leukocytosis and low Pro-Cal -infection appears less likely, more likely this was related to left upper extremity thrombophlebitis          -serology negative except for weakly positive ANA  - - Suspect Daptomycin related Eosinophilic Pneumonia -> older, male and been on daptomyicin > 10 days  - RVP - negative  -Improving, off BiPAP   -DD includes COVID lung but he was asymptomatic when he had positive preop testing, COVID IgG positive  be due to prior infection, clarified that he is vaccinated and boosted  Plan  - Continue empiric Solu-Medrol 125 every 6 -Continue high flow nasal cannula and nonrebreather and dial down FiO2 as tolerated    - steroid safety  - check G6PDH - in case he needs bactrim  - check Quantiferon gold for completion  -Add 10 units of Lantus and SSI resistant scale  Left upper extremity thrombophlebitis  -, keep limb elevated 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 -was on ceftriaxone and daptomycin, now on vancomycin and cefepime per ID   Updated patient and daughter at bedside, advancing diet and okay to mobilize  Full code      06/05/2020 9:08 AM    LABS    PULMONARY Recent Labs  Lab 06/02/20 2020 06/03/20 0540  PHART 7.383 7.367  PCO2ART 34.8 40.1  PO2ART 75.8* 70.1*  HCO3 20.3 22.5  O2SAT 94.2 92.7    CBC Recent Labs  Lab 06/03/20 1122 06/04/20 0342 06/05/20 0311  HGB 8.7* 8.6* 8.8*  HCT 29.4* 28.4* 29.5*  WBC 18.3* 12.9* 18.7*  PLT 322 294 339    COAGULATION Recent Labs  Lab 05/31/20 2026 06/01/20 0321  INR 1.2 1.3*    CARDIAC  No results for input(s): TROPONINI in the last 168 hours. No  results for input(s): PROBNP in the last 168 hours.   CHEMISTRY Recent Labs  Lab 06/01/20 0321 06/02/20 0607 06/03/20 0321 06/04/20 0342 06/05/20 0311  NA 136 140 137 140 138  K 3.9 4.4 4.0 4.1 4.1  CL 105 106 104 109 109  CO2 22 21* 23 22 22   GLUCOSE 109* 107* 129* 147* 156*  BUN 30* 38* 45* 50* 55*  CREATININE 1.35* 1.45* 1.36* 1.17 1.11  CALCIUM 8.1* 8.4* 8.2* 8.3* 8.3*   Estimated Creatinine Clearance: 92.9 mL/min (by C-G formula based on SCr of 1.11 mg/dL).   LIVER Recent Labs  Lab 05/31/20 2026 06/01/20 0321  AST 26 23  ALT 20 18  ALKPHOS 126 107  BILITOT 0.5 0.5  PROT 7.0 6.0*  ALBUMIN 3.1* 2.8*  INR 1.2 1.3*     INFECTIOUS Recent Labs  Lab 05/31/20 2026 05/31/20 2230 06/01/20 0321 06/03/20 1122 06/04/20 0342 06/05/20 0311  LATICACIDVEN 2.0* 1.5  --   --   --   --   PROCALCITON  --   --    < > 2.55 1.65 0.94   < > = values in this interval not displayed.     ENDOCRINE CBG (last 3)  Recent Labs    06/04/20 2313 06/05/20 0309 06/05/20 0747  GLUCAP 126* 137* 145*      ATTESTATION & SIGNATURE    The patient is critically ill with multiple organ systems failure and requires high complexity decision making for assessment and support, frequent evaluation and titration of therapies, application of advanced monitoring technologies and extensive interpretation of multiple databases. Critical Care Time devoted to patient care services described in this note independent of APP/resident  time is 31 minutes.    Kara Mead MD. Shade Flood. [facility] Pager : [phone]  If no response to pager , please call [phone] until 7 pm After 7:00 pm call Elink  [phone]     06/05/2020

## 2020-06-05 NOTE — Progress Notes (Signed)
Subjective: No new complaints, but concerned re how long swelling in arm will take to go down  Antibiotics:  Anti-infectives (From admission, onward)   Start     Dose/Rate Route Frequency Ordered Stop   06/03/20 1800  vancomycin (VANCOREADY) IVPB 1000 mg/200 mL        1,000 mg 200 mL/hr over 60 Minutes Intravenous Every 12 hours 06/03/20 0838     06/03/20 1030  azithromycin (ZITHROMAX) 500 mg in sodium chloride 0.9 % 250 mL IVPB        500 mg 250 mL/hr over 60 Minutes Intravenous Every 24 hours 06/03/20 0931 06/07/20 0959   06/03/20 1000  azithromycin (ZITHROMAX) tablet 250 mg  Status:  Discontinued       "Followed by" Linked Group Details   250 mg Oral Daily 06/02/20 0942 06/03/20 0931   06/02/20 1030  azithromycin (ZITHROMAX) tablet 500 mg       "Followed by" Linked Group Details   500 mg Oral Daily 06/02/20 0942 06/02/20 1042   06/02/20 0000  vancomycin (VANCOREADY) IVPB 1500 mg/300 mL  Status:  Discontinued        1,500 mg 150 mL/hr over 120 Minutes Intravenous Every 24 hours 06/01/20 0129 06/03/20 0838   06/01/20 0800  ceFEPIme (MAXIPIME) 2 g in sodium chloride 0.9 % 100 mL IVPB        2 g 200 mL/hr over 30 Minutes Intravenous Every 8 hours 06/01/20 0129     06/01/20 0400  vancomycin (VANCOREADY) IVPB 1000 mg/200 mL  Status:  Discontinued        1,000 mg 200 mL/hr over 60 Minutes Intravenous  Once 06/01/20 0300 06/01/20 0307   06/01/20 0400  ceFEPIme (MAXIPIME) 2 g in sodium chloride 0.9 % 100 mL IVPB  Status:  Discontinued        2 g 200 mL/hr over 30 Minutes Intravenous  Once 06/01/20 0300 06/01/20 0307   05/31/20 2245  vancomycin (VANCOCIN) IVPB 1000 mg/200 mL premix  Status:  Discontinued        1,000 mg 200 mL/hr over 60 Minutes Intravenous  Once 05/31/20 2232 05/31/20 2235   05/31/20 2245  ceFEPIme (MAXIPIME) 2 g in sodium chloride 0.9 % 100 mL IVPB        2 g 200 mL/hr over 30 Minutes Intravenous  Once 05/31/20 2232 06/01/20 0132   05/31/20 2245   vancomycin (VANCOREADY) IVPB 2000 mg/400 mL        2,000 mg 200 mL/hr over 120 Minutes Intravenous  Once 05/31/20 2235 06/01/20 0233      Medications: Scheduled Meds: . Chlorhexidine Gluconate Cloth  6 each Topical Daily  . dextromethorphan-guaiFENesin  1 tablet Oral BID  . enoxaparin (LOVENOX) injection  75 mg Subcutaneous Q24H  . insulin aspart  3-9 Units Subcutaneous Q4H  . insulin detemir  10 Units Subcutaneous Daily  . methylPREDNISolone (SOLU-MEDROL) injection  125 mg Intravenous Q6H  . traZODone  100 mg Oral QHS   Continuous Infusions: . sodium chloride Stopped (06/05/20 1800)  . azithromycin Stopped (06/05/20 1045)  . ceFEPime (MAXIPIME) IV Stopped (06/05/20 1728)  . vancomycin 200 mL/hr at 06/05/20 1805   PRN Meds:.sodium chloride, acetaminophen **OR** acetaminophen, benzonatate, HYDROcodone-homatropine, ipratropium-albuterol, ondansetron **OR** ondansetron (ZOFRAN) IV, phenol    Objective: Weight change:   Intake/Output Summary (Last 24 hours) at 06/05/2020 1823 Last data filed at 06/05/2020 1805 Gross per 24 hour  Intake 1323.09 ml  Output 2000 ml  Net -676.91 ml  Blood pressure 119/60, pulse 68, temperature 97.9 F (36.6 C), temperature source Axillary, resp. rate 16, height 6\' 7"  (2.007 m), weight (!) 149.4 kg, SpO2 94 %. Temp:  [97.5 F (36.4 C)-97.9 F (36.6 C)] 97.9 F (36.6 C) (04/26 1136) Pulse Rate:  [58-75] 68 (04/26 1800) Resp:  [16-35] 16 (04/26 1800) BP: (98-143)/(49-109) 119/60 (04/26 1800) SpO2:  [86 %-97 %] 94 % (04/26 1800) FiO2 (%):  [95 %] 95 % (04/25 2000)  Physical Exam: Physical Exam Constitutional:      Appearance: He is well-developed.  HENT:     Head: Normocephalic and atraumatic.  Eyes:     Conjunctiva/sclera: Conjunctivae normal.  Cardiovascular:     Rate and Rhythm: Regular rhythm. Tachycardia present.     Heart sounds: No murmur heard. No friction rub. No gallop.   Pulmonary:     Effort: No respiratory distress.      Breath sounds: No stridor. No wheezing.  Abdominal:     General: There is no distension.     Palpations: Abdomen is soft.  Musculoskeletal:        General: Normal range of motion.     Cervical back: Normal range of motion and neck supple.  Skin:    General: Skin is warm and dry.     Findings: No erythema or rash.  Neurological:     General: No focal deficit present.     Mental Status: He is alert and oriented to person, place, and time.  Psychiatric:        Mood and Affect: Mood normal.        Behavior: Behavior normal.        Thought Content: Thought content normal.        Judgment: Judgment normal.      CBC:    BMET Recent Labs    06/04/20 0342 06/05/20 0311  NA 140 138  K 4.1 4.1  CL 109 109  CO2 22 22  GLUCOSE 147* 156*  BUN 50* 55*  CREATININE 1.17 1.11  CALCIUM 8.3* 8.3*     Liver Panel  No results for input(s): PROT, ALBUMIN, AST, ALT, ALKPHOS, BILITOT, BILIDIR, IBILI in the last 72 hours.     Sedimentation Rate Recent Labs    06/03/20 1122  ESRSEDRATE 86*   C-Reactive Protein No results for input(s): CRP in the last 72 hours.  Micro Results: Recent Results (from the past 720 hour(s))  Culture, blood (routine x 2)     Status: None (Preliminary result)   Collection Time: 05/31/20  7:58 PM   Specimen: BLOOD RIGHT HAND  Result Value Ref Range Status   Specimen Description   Final    BLOOD RIGHT HAND Performed at [facility] [address] [place], [place] [address]    Special Requests   Final    BOTTLES DRAWN AEROBIC AND ANAEROBIC Blood Culture adequate volume Performed at [facility] [address], [place], [place] [address]    Culture   Final    NO GROWTH 4 DAYS Performed at [facility], [place] [address], [place], [place] [address]    Report Status PENDING  Incomplete  Culture, blood (Routine X 2) w Reflex to ID Panel     Status: None (Preliminary result)   Collection Time: 05/31/20   8:16 PM   Specimen: BLOOD  Result Value Ref Range Status   Specimen Description   Final    BLOOD RIGHT ANTECUBITAL Performed at [facility], [place] [address] [place], [place] [address]    Special Requests   Final    BOTTLES DRAWN AEROBIC AND ANAEROBIC Blood Culture results may not be optimal due to an excessive volume of blood received in culture bottles Performed at [facility] [address], [place], [place] [address]    Culture   Final    NO GROWTH 4 DAYS Performed at [facility], [place] [address], [place], [place] [address]    Report Status PENDING  Incomplete  Culture, blood (routine x 2)     Status: None (Preliminary result)   Collection Time: 05/31/20  8:26 PM   Specimen: BLOOD RIGHT HAND  Result Value Ref Range Status   Specimen Description   Final    BLOOD RIGHT HAND Performed at [facility] [address], [place], [place] [address]    Special Requests   Final    BOTTLES DRAWN AEROBIC ONLY Blood Culture results may not be optimal due to an excessive volume of blood received in culture bottles Performed at [facility] [address] [place], [place] [address]    Culture   Final    NO GROWTH 4 DAYS Performed at [facility], [place] [address] [place], [place] [address]    Report Status PENDING  Incomplete  Respiratory (~20 pathogens) panel by PCR     Status: None   Collection Time: 05/31/20 10:02 PM   Specimen: Nasopharyngeal Swab; Respiratory  Result Value Ref Range Status   Adenovirus NOT DETECTED NOT DETECTED Final   Coronavirus 229E NOT DETECTED NOT DETECTED Final    Comment: (NOTE) The Coronavirus on the Respiratory Panel, DOES NOT test for the novel  Coronavirus (2019 nCoV)    Coronavirus HKU1 NOT DETECTED NOT DETECTED Final   Coronavirus NL63 NOT DETECTED NOT DETECTED Final   Coronavirus OC43 NOT DETECTED NOT DETECTED Final   Metapneumovirus NOT DETECTED NOT  DETECTED Final   Rhinovirus / Enterovirus NOT DETECTED NOT DETECTED Final   Influenza A NOT DETECTED NOT DETECTED Final   Influenza B NOT DETECTED NOT DETECTED Final   Parainfluenza Virus 1 NOT DETECTED NOT DETECTED Final   Parainfluenza Virus 2 NOT DETECTED NOT DETECTED Final   Parainfluenza Virus 3 NOT DETECTED NOT DETECTED Final   Parainfluenza Virus 4 NOT DETECTED NOT DETECTED Final   Respiratory Syncytial Virus NOT DETECTED NOT DETECTED Final   Bordetella pertussis NOT DETECTED NOT DETECTED Final   Bordetella Parapertussis NOT DETECTED NOT DETECTED Final   Chlamydophila pneumoniae NOT DETECTED NOT DETECTED Final   Mycoplasma pneumoniae NOT DETECTED NOT DETECTED Final    Comment: Performed at [facility], [place]. [address] [place], [place] [address]  Expectorated Sputum Assessment w Gram Stain, Rflx to Resp Cult     Status: None   Collection Time: 06/02/20  9:31 AM   Specimen: Sputum  Result Value Ref Range Status   Specimen Description SPUTUM  Final   Special Requests NONE  Final   Sputum evaluation   Final    THIS SPECIMEN IS ACCEPTABLE FOR SPUTUM CULTURE Performed at [facility], [place] [address] [place], [place] [address]    Report Status 06/04/2020 FINAL  Final  Culture, Respiratory w Gram Stain     Status: None   Collection Time: 06/02/20  9:31 AM   Specimen: SPU  Result Value Ref Range Status   Specimen Description   Final    SPUTUM Performed at  [facility], [place] [address] [place], [place] [address]    Special Requests   Final    NONE Reflexed from [id number] Performed at [facility], [place] [address], [place], [place] [address]    Gram Stain   Final    RARE WBC PRESENT,BOTH PMN AND MONONUCLEAR FEW YEAST FEW GRAM POSITIVE COCCI IN PAIRS RARE GRAM NEGATIVE RODS    Culture   Final    FEW CANDIDA ALBICANS WITHIN NORMAL RESPIRATORY FLORA Performed at [facility], [place] [address], [place], [place]  [address]    Report Status 06/05/2020 FINAL  Final  MRSA PCR Screening     Status: None   Collection Time: 06/02/20  9:59 PM   Specimen: Nasal Mucosa; Nasopharyngeal  Result Value Ref Range Status   MRSA by PCR NEGATIVE NEGATIVE Final    Comment:        The GeneXpert MRSA Assay (FDA approved for NASAL specimens only), is one component of a comprehensive MRSA colonization surveillance program. It is not intended to diagnose MRSA infection nor to guide or monitor treatment for MRSA infections. Performed at [facility], [place] [address], [place], [place] [address]     Studies/Results: No results found.    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 He was changed to vancomycin and cefepime. 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  He is on steroids, vancomycin and cefepime and azithromycin  Legionella ag is negative and I will DC macrolide  #2 PJI: once he recovers from #1 would likely complete IV abx with vancomycin + ceftriaxone vs doxycyline and ceftriaxone followed by po antibiotics  #3 Left arm edema where PICC was placed seems to have had infiltration of soft tissues and no DVT  I spent greater than 35 minutes with the patient including greater than 50% of time in face to face counsel of the  patient and his wife and in coordination of his care.     LOS: 5 days   Alcide Evener 06/05/2020, 6:23 PM

## 2020-06-06 DIAGNOSIS — J984 Other disorders of lung: Secondary | ICD-10-CM | POA: Diagnosis not present

## 2020-06-06 DIAGNOSIS — J9601 Acute respiratory failure with hypoxia: Secondary | ICD-10-CM | POA: Diagnosis not present

## 2020-06-06 LAB — CULTURE, BLOOD (ROUTINE X 2)
Culture: NO GROWTH
Culture: NO GROWTH
Culture: NO GROWTH
Special Requests: ADEQUATE

## 2020-06-06 LAB — BASIC METABOLIC PANEL
Anion gap: 10 (ref 5–15)
BUN: 60 mg/dL — ABNORMAL HIGH (ref 8–23)
CO2: 18 mmol/L — ABNORMAL LOW (ref 22–32)
Calcium: 7.9 mg/dL — ABNORMAL LOW (ref 8.9–10.3)
Chloride: 108 mmol/L (ref 98–111)
Creatinine, Ser: 1.07 mg/dL (ref 0.61–1.24)
GFR, Estimated: >60 mL/min (ref >60)
Glucose, Bld: 127 mg/dL — ABNORMAL HIGH (ref 70–99)
Potassium: 4.5 mmol/L (ref 3.5–5.1)
Sodium: 136 mmol/L (ref 135–145)

## 2020-06-06 LAB — CBC
HCT: 28.9 % — ABNORMAL LOW (ref 39.0–52.0)
Hemoglobin: 8.9 g/dL — ABNORMAL LOW (ref 13.0–17.0)
MCH: 24.4 pg — ABNORMAL LOW (ref 26.0–34.0)
MCHC: 30.8 g/dL (ref 30.0–36.0)
MCV: 79.2 fL — ABNORMAL LOW (ref 80.0–100.0)
Platelets: 213 10*3/uL (ref 150–400)
RBC: 3.65 MIL/uL — ABNORMAL LOW (ref 4.22–5.81)
RDW: 18 % — ABNORMAL HIGH (ref 11.5–15.5)
WBC: 15 10*3/uL — ABNORMAL HIGH (ref 4.0–10.5)
nRBC: 0.2 % (ref 0.0–0.2)

## 2020-06-06 LAB — GLUCOSE, CAPILLARY
Glucose-Capillary: 157 mg/dL — ABNORMAL HIGH (ref 70–99)
Glucose-Capillary: 153 mg/dL — ABNORMAL HIGH (ref 70–99)
Glucose-Capillary: 146 mg/dL — ABNORMAL HIGH (ref 70–99)
Glucose-Capillary: 178 mg/dL — ABNORMAL HIGH (ref 70–99)
Glucose-Capillary: 162 mg/dL — ABNORMAL HIGH (ref 70–99)

## 2020-06-06 LAB — MPO/PR-3 (ANCA) ANTIBODIES
ANCA Proteinase 3: <3.5 U/mL (ref 0.0–3.5)
Myeloperoxidase Abs: <9 U/mL (ref 0.0–9.0)

## 2020-06-06 MED ORDER — DOXYCYCLINE HYCLATE 100 MG PO TABS
100.0000 mg | ORAL_TABLET | Freq: Two times a day (BID) | ORAL | Status: DC
  Administered 2020-06-07 – 2020-06-12 (×11): 100 mg via ORAL
  Filled 2020-06-06 (×12): qty 1

## 2020-06-06 MED ORDER — METHYLPREDNISOLONE SODIUM SUCC 125 MG IJ SOLR
80.0000 mg | Freq: Three times a day (TID) | INTRAMUSCULAR | Status: DC
  Administered 2020-06-06 – 2020-06-09 (×8): 80 mg via INTRAVENOUS
  Filled 2020-06-06 (×8): qty 2

## 2020-06-06 MED ORDER — CEFDINIR 300 MG PO CAPS
300.0000 mg | ORAL_CAPSULE | Freq: Two times a day (BID) | ORAL | Status: DC
  Administered 2020-06-07 – 2020-06-12 (×11): 300 mg via ORAL
  Filled 2020-06-06 (×12): qty 1

## 2020-06-06 MED ORDER — BENZONATATE 100 MG PO CAPS
200.0000 mg | ORAL_CAPSULE | Freq: Three times a day (TID) | ORAL | Status: DC
  Administered 2020-06-06 – 2020-06-12 (×17): 200 mg via ORAL
  Filled 2020-06-06 (×18): qty 2

## 2020-06-06 NOTE — Progress Notes (Signed)
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  Has a history of left hip prosthesis that has been complicated by multiple revisions due to subluxation and recurrent left hip seroma.  He had a fall in November 2021 with a left trochanteric femur fracture and status post ORIF in December 2021.  In early January 2022 , pre-op COVID-19 test was POS.  Subsequently had a second ORIF in mid January 2022 due to periprosthetic fracture.  Recommend incision and drainage on May 01, 2020.  At this point in time placed on daptomycin and ceftriaxone for 6 weeks upper extremity PICC line.  Admitted on 05/31/2020 [1 month into daptomycin and ceftriaxone treatment] with clinically left PICC line related suspected cellulitis but also cough and shortness of breath [he also had cough even before daptomycin] but the cough is worsened over 2 weeks prior to admission.  Left upper extremity duplex negative for DVT at admission.  CT angiogram chest negative for pulmonary embolus but showed mild cardiomegaly and bilateral small pleural effusions with bilateral patchy groundglass opacities especially in the lower lobe.  Respiratory virus panel negative.  Seen by infectious disease consultation and orthopedics 06/01/2020 and antibiotics changed.  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  Course since admission  -Appears that entry into the ER was 91% on room air.  Somewhere along the way he was on 2 L nasal cannula.  But on the evening of 06/02/2020 he started requiring more oxygen and transferred to stepdown.  On the morning of 06/03/2020 was getting somnolent and requiring 15 L nasal cannula with easy desaturations and significant tachypnea.   Critical care medicine ordered BiPAP and he had rapid improvement in mental status.  Pulse ox 99% on 60% FiO2 on BiPAP   Antibiotic history   Daptomycin 3/24 >> (stopped4/20/22, intent was to stop 06/14/20) Ceftriaxone 3/24 >> (Stopped 05/30/20, intent was to stop 06/14/20) xxxx Vanc 4/21>> Cefepime 4/21 >> Azithro 4/23  >>4/28   Peripheral eos hx  Results for Jacob Mitchell (MRN [id number]) as of 06/03/2020 08:29  Ref. Range 01/17/2020 12:20 01/17/2020 14:56 01/17/2020 15:37 01/18/2020 03:28 01/19/2020 03:30 02/07/2020 08:46 02/27/2020 09:50 02/27/2020 13:39 02/28/2020 03:39 02/29/2020 03:33 05/01/2020 13:51 05/01/2020 17:29 05/02/2020 08:55 05/03/2020 08:37 05/04/2020 09:15 05/31/2020 17:42 06/01/2020 03:21 06/02/2020 06:07 06/03/2020 03:21  Eosinophils Absolute Latest Ref Range: 0.0 - 0.5 K/uL                0.3  0.0     Pertinent  Medical History     has a past medical history of Benign localized prostatic hyperplasia with lower urinary tract symptoms (LUTS), Complication of anesthesia, Diverticulosis of colon, History of colon polyps, History of kidney stones, History of MRSA infection (2007), History of urinary retention (05/31/2017), Incomplete emptying of bladder, OA (osteoarthritis), Peripheral neuropathy, Pinched nerve in neck, Wears glasses, and Wears hearing aid in both ears.   Significant Hospital Events: Including procedures, antibiotic start and stop dates in addition to other pertinent events   . 05/31/2020 - admit.  91% oxygen on room air . 06/01/2020: 91% oxygen on room air.  Dr. Alvan Dame orthopedic consult: Recurrent left hip seroma.  Antibiotic plan per infectious diseases.  If no IV antibiotic  planned and recommended doxycycline for suppression for 6 months at least and consider IR drain to the left hip o Left upper extremity PICC line removed . 4/22 ID consut - recopmmend dc pic line due to cellulitis concern and marked upper extremity edema and follow-up blood cultures.  Consider you cephalic  pneumonia. . 4/23 - speech eval -regular diet, think liquids . 4/25 off bipap 10pm . 4/26 on high flow nasal cannula plus nonrebreather    Interim History / Subjective:   Improving, stable critically ill State off BiPAP last 24 hours, remains on high flow nasal cannula and nonrebreather this morning but saturations appear improved to 97-98% Did not sleep well last night  Objective   Blood pressure 127/61, pulse 69, temperature 98.9 F (37.2 C), temperature source Axillary, resp. rate (!) 21, height 6\' 7"  (2.007 m), weight (!) 149.4 kg, SpO2 91 %.        Intake/Output Summary (Last 24 hours) at 06/06/2020 1028 Last data filed at 06/06/2020 1610 Gross per 24 hour  Intake 1402.75 ml  Output 1400 ml  Net 2.75 ml   Filed Weights   05/31/20 1741 06/02/20 2100  Weight: 136.1 kg (!) 149.4 kg    Examination: General: Obese male on HFNC + NRB , good spirits HENT: No pallor, no JVD Lungs: No accessory muscle use, scattered crackles especially left base Cardiovascular: Regular rate and rhythm Abdomen: Obese and soft nontender Extremities: No cyanosis no clubbing , 1+ weeping edema left forearm and hand Neuro: Alert and interactive, can speak in full sentences   Labs/imaging that I havepersonally reviewed  (right click and "Reselect all SmartList Selections" daily)   Labs show stable anemia and leukocytosis, normal electrolytes  Chest x-ray 4/23 shows moderate patchy bilateral interstitial airspace disease. Small effusions.  CTA chest 4/21 small bilateral effusions, bilateral patchy groundglass opacity?  Multilobar pneumonia.  CT chest without contrast 4/24 persistent bilateral multifocal and groundglass opacities, no effusions  Echo 4/24 >> nml LVEF , gr 1 DD, asc aorta 70mm  Urine Legionella negative Urine strep antigen negative  Resolved Hospital Problem list     Assessment & Plan:   Acute Hypoxemic Respiratory Failure - severe, needing BiPAP and high fio2.  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, male and been on daptomyicin > 10 days, good response to steroids started 4/24  - RVP - negative  -Improving, off BiPAP   -DD includes COVID lung but he was asymptomatic when he had positive preop testing, COVID IgG positive  be due to prior infection, clarified that he is vaccinated and boosted  Plan  - Continue empiric Solu-Medrol 125 every 6 , will drop to 80 every 8 after his dose today -Continue high flow nasal cannula and attempt to discontinue nonrebreather -Await QuantiFERON gold   Steroid-induced hyperglycemia  -Controlled with 10 units of Lantus and SSI resistant scale  Left upper extremity thrombophlebitis  -, keep limb elevated 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 No results for input(s): PROBNP in the last 168 hours.   CHEMISTRY Recent Labs  Lab 06/02/20 0607 06/03/20 0321 06/04/20 0342 06/05/20 0311 06/06/20 0711  NA 140 137 140 138 136  K 4.4 4.0 4.1 4.1 4.5  CL 106 104 109 109 108  CO2 21* 23 22 22  18*  GLUCOSE 107* 129* 147* 156* 127*  BUN 38* 45* 50* 55* 60*  CREATININE 1.45* 1.36* 1.17 1.11 1.07  CALCIUM  8.4* 8.2* 8.3* 8.3* 7.9*   Estimated Creatinine Clearance: 96.4 mL/min (by C-G formula based on SCr of 1.07 mg/dL).   LIVER Recent Labs  Lab 05/31/20 2026 06/01/20 0321  AST 26 23  ALT 20 18  ALKPHOS 126 107  BILITOT 0.5 0.5  PROT 7.0 6.0*  ALBUMIN 3.1* 2.8*  INR 1.2 1.3*     INFECTIOUS Recent Labs  Lab 05/31/20 2026 05/31/20 2230 06/01/20 0321 06/03/20 1122 06/04/20 0342 06/05/20 0311  LATICACIDVEN 2.0* 1.5  --   --   --   --   PROCALCITON  --   --    < > 2.55 1.65 0.94   < > = values in this interval not displayed.     ENDOCRINE CBG (last 3)  Recent Labs    06/05/20 2347 06/06/20 0307 06/06/20 0817  GLUCAP 129* 157* 153*      ATTESTATION & SIGNATURE      Kara Mead MD. FCCP. [facility] Pager : [phone]  If no response to pager , please call [phone] until 7 pm After 7:00 pm call Elink  [phone]     06/06/2020

## 2020-06-06 NOTE — Progress Notes (Signed)
Subjective:  His face mask is irritating his chin and he wonders if there is a larger one or if he   Antibiotics:  Anti-infectives (From admission, onward)   Start     Dose/Rate Route Frequency Ordered Stop   06/07/20 1000  cefdinir (OMNICEF) capsule 300 mg        300 mg Oral Every 12 hours 06/06/20 1002     06/07/20 1000  doxycycline (VIBRA-TABS) tablet 100 mg        100 mg Oral Every 12 hours 06/06/20 1002     06/03/20 1800  vancomycin (VANCOREADY) IVPB 1000 mg/200 mL        1,000 mg 200 mL/hr over 60 Minutes Intravenous Every 12 hours 06/03/20 0838 06/06/20 2359   06/03/20 1030  azithromycin (ZITHROMAX) 500 mg in sodium chloride 0.9 % 250 mL IVPB  Status:  Discontinued        500 mg 250 mL/hr over 60 Minutes Intravenous Every 24 hours 06/03/20 0931 06/06/20 0758   06/03/20 1000  azithromycin (ZITHROMAX) tablet 250 mg  Status:  Discontinued       "Followed by" Linked Group Details   250 mg Oral Daily 06/02/20 0942 06/03/20 0931   06/02/20 1030  azithromycin (ZITHROMAX) tablet 500 mg       "Followed by" Linked Group Details   500 mg Oral Daily 06/02/20 0942 06/02/20 1042   06/02/20 0000  vancomycin (VANCOREADY) IVPB 1500 mg/300 mL  Status:  Discontinued        1,500 mg 150 mL/hr over 120 Minutes Intravenous Every 24 hours 06/01/20 0129 06/03/20 0838   06/01/20 0800  ceFEPIme (MAXIPIME) 2 g in sodium chloride 0.9 % 100 mL IVPB        2 g 200 mL/hr over 30 Minutes Intravenous Every 8 hours 06/01/20 0129 06/06/20 2359   06/01/20 0400  vancomycin (VANCOREADY) IVPB 1000 mg/200 mL  Status:  Discontinued        1,000 mg 200 mL/hr over 60 Minutes Intravenous  Once 06/01/20 0300 06/01/20 0307   06/01/20 0400  ceFEPIme (MAXIPIME) 2 g in sodium chloride 0.9 % 100 mL IVPB  Status:  Discontinued        2 g 200 mL/hr over 30 Minutes Intravenous  Once 06/01/20 0300 06/01/20 0307   05/31/20 2245  vancomycin (VANCOCIN) IVPB 1000 mg/200 mL premix  Status:  Discontinued        1,000  mg 200 mL/hr over 60 Minutes Intravenous  Once 05/31/20 2232 05/31/20 2235   05/31/20 2245  ceFEPIme (MAXIPIME) 2 g in sodium chloride 0.9 % 100 mL IVPB        2 g 200 mL/hr over 30 Minutes Intravenous  Once 05/31/20 2232 06/01/20 0132   05/31/20 2245  vancomycin (VANCOREADY) IVPB 2000 mg/400 mL        2,000 mg 200 mL/hr over 120 Minutes Intravenous  Once 05/31/20 2235 06/01/20 0233      Medications: Scheduled Meds: . benzonatate  200 mg Oral TID  . [START ON 06/07/2020] cefdinir  300 mg Oral Q12H  . Chlorhexidine Gluconate Cloth  6 each Topical Daily  . dextromethorphan-guaiFENesin  1 tablet Oral BID  . [START ON 06/07/2020] doxycycline  100 mg Oral Q12H  . enoxaparin (LOVENOX) injection  75 mg Subcutaneous Q24H  . insulin aspart  3-9 Units Subcutaneous Q4H  . insulin detemir  10 Units Subcutaneous Daily  . methylPREDNISolone (SOLU-MEDROL) injection  80 mg Intravenous Q8H  .  traZODone  100 mg Oral QHS   Continuous Infusions: . sodium chloride 10 mL/hr at 06/06/20 1415  . ceFEPime (MAXIPIME) IV Stopped (06/06/20 0855)  . vancomycin Stopped (06/06/20 0625)   PRN Meds:.sodium chloride, acetaminophen **OR** acetaminophen, HYDROcodone-homatropine, ipratropium-albuterol, ondansetron **OR** ondansetron (ZOFRAN) IV, phenol    Objective: Weight change:   Intake/Output Summary (Last 24 hours) at 06/06/2020 1715 Last data filed at 06/06/2020 1415 Gross per 24 hour  Intake 1061.53 ml  Output 1400 ml  Net -338.47 ml   Blood pressure (!) 106/41, pulse 64, temperature (!) 97.2 F (36.2 C), temperature source Axillary, resp. rate 14, height 6\' 7"  (2.007 m), weight (!) 149.4 kg, SpO2 93 %. Temp:  [97.2 F (36.2 C)-98.9 F (37.2 C)] 97.2 F (36.2 C) (04/27 1600) Pulse Rate:  [50-81] 64 (04/27 1700) Resp:  [14-32] 14 (04/27 1700) BP: (93-137)/(37-83) 106/41 (04/27 1500) SpO2:  [72 %-100 %] 93 % (04/27 1700)  Physical Exam: Physical Exam Constitutional:      Appearance: He is  well-developed.  HENT:     Head: Normocephalic and atraumatic.  Eyes:     Conjunctiva/sclera: Conjunctivae normal.  Cardiovascular:     Rate and Rhythm: Regular rhythm. Tachycardia present.     Heart sounds: No murmur heard. No friction rub. No gallop.   Pulmonary:     Effort: No respiratory distress.     Breath sounds: No stridor. No wheezing.  Abdominal:     General: There is no distension.     Palpations: Abdomen is soft.  Musculoskeletal:        General: Normal range of motion.     Cervical back: Normal range of motion and neck supple.  Skin:    General: Skin is warm and dry.     Coloration: Skin is not pale.     Findings: No erythema or rash.  Neurological:     General: No focal deficit present.     Mental Status: He is alert and oriented to person, place, and time.  Psychiatric:        Mood and Affect: Mood normal.        Behavior: Behavior normal.        Thought Content: Thought content normal.        Judgment: Judgment normal.      CBC:    BMET Recent Labs    06/05/20 0311 06/06/20 0711  NA 138 136  K 4.1 4.5  CL 109 108  CO2 22 18*  GLUCOSE 156* 127*  BUN 55* 60*  CREATININE 1.11 1.07  CALCIUM 8.3* 7.9*     Liver Panel  No results for input(s): PROT, ALBUMIN, AST, ALT, ALKPHOS, BILITOT, BILIDIR, IBILI in the last 72 hours.     Sedimentation Rate No results for input(s): ESRSEDRATE in the last 72 hours. C-Reactive Protein No results for input(s): CRP in the last 72 hours.  Micro Results: Recent Results (from the past 720 hour(s))  Culture, blood (routine x 2)     Status: None   Collection Time: 05/31/20  7:58 PM   Specimen: BLOOD RIGHT HAND  Result Value Ref Range Status   Specimen Description   Final    BLOOD RIGHT HAND Performed at [facility] [address] [place], [place] [address]    Special Requests   Final    BOTTLES DRAWN AEROBIC AND ANAEROBIC Blood Culture adequate volume Performed at [facility] [address], [place], [place] [address]    Culture  Final    NO GROWTH 5 DAYS Performed at [facility], [place] [address] [place], [place] [address]    Report Status 06/06/2020 FINAL  Final  Culture, blood (Routine X 2) w Reflex to ID Panel     Status: None   Collection Time: 05/31/20  8:16 PM   Specimen: BLOOD  Result Value Ref Range Status   Specimen Description   Final    BLOOD RIGHT ANTECUBITAL Performed at [facility] [address], [place], [place] [address]    Special Requests   Final    BOTTLES DRAWN AEROBIC AND ANAEROBIC Blood Culture results may not be optimal due to an excessive volume of blood received in culture bottles Performed at [facility] [address] [place], [place] [address]    Culture   Final    NO GROWTH 5 DAYS Performed at [facility], [place] [address], [place], [place] [address]    Report Status 06/06/2020 FINAL  Final  Culture, blood (routine x 2)     Status: None   Collection Time: 05/31/20  8:26 PM   Specimen: BLOOD RIGHT HAND  Result Value Ref Range Status   Specimen Description   Final    BLOOD RIGHT HAND Performed at [facility] [address] [place] [address], [place], [place] [address]    Special Requests   Final    BOTTLES DRAWN AEROBIC ONLY Blood Culture results may not be optimal due to an excessive volume of blood received in culture bottles Performed at [facility] [address] [place], [place] [address]    Culture   Final    NO GROWTH 5 DAYS Performed at [facility], [place] [address], [place], [place] [address]    Report Status 06/06/2020 FINAL  Final  Respiratory (~20 pathogens) panel by PCR     Status: None   Collection Time: 05/31/20 10:02 PM   Specimen: Nasopharyngeal Swab; Respiratory  Result Value Ref Range Status   Adenovirus NOT DETECTED NOT DETECTED Final   Coronavirus 229E NOT DETECTED NOT DETECTED Final     Comment: (NOTE) The Coronavirus on the Respiratory Panel, DOES NOT test for the novel  Coronavirus (2019 nCoV)    Coronavirus HKU1 NOT DETECTED NOT DETECTED Final   Coronavirus NL63 NOT DETECTED NOT DETECTED Final   Coronavirus OC43 NOT DETECTED NOT DETECTED Final   Metapneumovirus NOT DETECTED NOT DETECTED Final   Rhinovirus / Enterovirus NOT DETECTED NOT DETECTED Final   Influenza A NOT DETECTED NOT DETECTED Final   Influenza B NOT DETECTED NOT DETECTED Final   Parainfluenza Virus 1 NOT DETECTED NOT DETECTED Final   Parainfluenza Virus 2 NOT DETECTED NOT DETECTED Final   Parainfluenza Virus 3 NOT DETECTED NOT DETECTED Final   Parainfluenza Virus 4 NOT DETECTED NOT DETECTED Final   Respiratory Syncytial Virus NOT DETECTED NOT DETECTED Final   Bordetella pertussis NOT DETECTED NOT DETECTED Final   Bordetella Parapertussis NOT DETECTED NOT DETECTED Final   Chlamydophila pneumoniae NOT DETECTED NOT DETECTED Final   Mycoplasma pneumoniae NOT DETECTED NOT DETECTED Final    Comment: Performed at [facility], [place]. [address], [place], [place] [address]  Expectorated Sputum Assessment w Gram Stain, Rflx to Resp Cult     Status: None   Collection Time: 06/02/20  9:31 AM   Specimen: Sputum  Result Value Ref Range Status   Specimen Description SPUTUM  Final   Special Requests NONE  Final   Sputum evaluation   Final    THIS SPECIMEN IS ACCEPTABLE FOR SPUTUM CULTURE Performed at [facility] [address] [place], [place] [address]    Report Status 06/04/2020 FINAL  Final  Culture, Respiratory w Gram Stain     Status: None   Collection Time: 06/02/20  9:31 AM   Specimen: SPU  Result Value Ref Range Status   Specimen Description   Final    SPUTUM Performed at [facility] [address], [place], [place] [address]    Special Requests   Final    NONE Reflexed from [id number] Performed at [facility], [place] [address],  [place], [place] [address]    Gram Stain   Final    RARE WBC PRESENT,BOTH PMN AND MONONUCLEAR FEW YEAST FEW GRAM POSITIVE COCCI IN PAIRS RARE GRAM NEGATIVE RODS    Culture   Final    FEW CANDIDA ALBICANS WITHIN NORMAL RESPIRATORY FLORA Performed at [facility], [place] [address], [place], [place] [address]    Report Status 06/05/2020 FINAL  Final  MRSA PCR Screening     Status: None   Collection Time: 06/02/20  9:59 PM   Specimen: Nasal Mucosa; Nasopharyngeal  Result Value Ref Range Status   MRSA by PCR NEGATIVE NEGATIVE Final    Comment:        The GeneXpert MRSA Assay (FDA approved for NASAL specimens only), is one component of a comprehensive MRSA colonization surveillance program. It is not intended to diagnose MRSA infection nor to guide or monitor treatment for MRSA infections. Performed at [facility], [place] [address] [place], [place] [address]     Studies/Results: No results found.    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 He was changed to vancomycin and cefepime. 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  He is on steroids, vancomycin and cefepime and   Today is day # 7 of antimicrobials for pneumonia and we will stop IV abx  #2 PJI: will start doxycline and cefdinir tomorrow.  #3 Left arm edema  where PICC was placed seems to have had infiltration of soft tissues and no DVT  I spent greater than 35 minutes with the patient including greater than 50% of time in face to face counsel of the patient reviewing pertinent data and in coordination of his care.      LOS: 6 days   Alcide Evener 06/06/2020, 5:15 PM

## 2020-06-07 ENCOUNTER — Inpatient Hospital Stay (HOSPITAL_COMMUNITY): Payer: Medicare Other

## 2020-06-07 DIAGNOSIS — T50905A Adverse effect of unspecified drugs, medicaments and biological substances, initial encounter: Secondary | ICD-10-CM

## 2020-06-07 DIAGNOSIS — T80219A Unspecified infection due to central venous catheter, initial encounter: Secondary | ICD-10-CM | POA: Diagnosis not present

## 2020-06-07 DIAGNOSIS — J984 Other disorders of lung: Secondary | ICD-10-CM | POA: Diagnosis not present

## 2020-06-07 DIAGNOSIS — A419 Sepsis, unspecified organism: Secondary | ICD-10-CM | POA: Diagnosis not present

## 2020-06-07 DIAGNOSIS — J9601 Acute respiratory failure with hypoxia: Secondary | ICD-10-CM | POA: Diagnosis not present

## 2020-06-07 DIAGNOSIS — T8459XA Infection and inflammatory reaction due to other internal joint prosthesis, initial encounter: Secondary | ICD-10-CM

## 2020-06-07 DIAGNOSIS — Z96649 Presence of unspecified artificial hip joint: Secondary | ICD-10-CM

## 2020-06-07 LAB — CBC
HCT: 28.3 % — ABNORMAL LOW (ref 39.0–52.0)
Hemoglobin: 8.4 g/dL — ABNORMAL LOW (ref 13.0–17.0)
MCH: 24.5 pg — ABNORMAL LOW (ref 26.0–34.0)
MCHC: 29.7 g/dL — ABNORMAL LOW (ref 30.0–36.0)
MCV: 82.5 fL (ref 80.0–100.0)
Platelets: 250 10*3/uL (ref 150–400)
RBC: 3.43 MIL/uL — ABNORMAL LOW (ref 4.22–5.81)
RDW: 18 % — ABNORMAL HIGH (ref 11.5–15.5)
WBC: 14.5 10*3/uL — ABNORMAL HIGH (ref 4.0–10.5)
nRBC: 0.2 % (ref 0.0–0.2)

## 2020-06-07 LAB — BASIC METABOLIC PANEL
Anion gap: 8 (ref 5–15)
BUN: 59 mg/dL — ABNORMAL HIGH (ref 8–23)
CO2: 20 mmol/L — ABNORMAL LOW (ref 22–32)
Calcium: 8 mg/dL — ABNORMAL LOW (ref 8.9–10.3)
Chloride: 110 mmol/L (ref 98–111)
Creatinine, Ser: 1.09 mg/dL (ref 0.61–1.24)
GFR, Estimated: >60 mL/min (ref >60)
Glucose, Bld: 124 mg/dL — ABNORMAL HIGH (ref 70–99)
Potassium: 4.4 mmol/L (ref 3.5–5.1)
Sodium: 138 mmol/L (ref 135–145)

## 2020-06-07 LAB — GLUCOSE, CAPILLARY
Glucose-Capillary: 113 mg/dL — ABNORMAL HIGH (ref 70–99)
Glucose-Capillary: 122 mg/dL — ABNORMAL HIGH (ref 70–99)
Glucose-Capillary: 122 mg/dL — ABNORMAL HIGH (ref 70–99)
Glucose-Capillary: 124 mg/dL — ABNORMAL HIGH (ref 70–99)
Glucose-Capillary: 127 mg/dL — ABNORMAL HIGH (ref 70–99)
Glucose-Capillary: 138 mg/dL — ABNORMAL HIGH (ref 70–99)

## 2020-06-07 MED ORDER — INSULIN ASPART 100 UNIT/ML ~~LOC~~ SOLN
3.0000 [IU] | Freq: Three times a day (TID) | SUBCUTANEOUS | Status: DC
  Administered 2020-06-07: 3 [IU] via SUBCUTANEOUS
  Administered 2020-06-08: 6 [IU] via SUBCUTANEOUS
  Administered 2020-06-08 – 2020-06-09 (×2): 3 [IU] via SUBCUTANEOUS
  Administered 2020-06-10 (×2): 6 [IU] via SUBCUTANEOUS
  Administered 2020-06-11: 3 [IU] via SUBCUTANEOUS
  Administered 2020-06-11: 6 [IU] via SUBCUTANEOUS

## 2020-06-07 MED ORDER — DEXTROMETHORPHAN POLISTIREX ER 30 MG/5ML PO SUER
30.0000 mg | Freq: Two times a day (BID) | ORAL | Status: DC
  Administered 2020-06-07 – 2020-06-12 (×11): 30 mg via ORAL
  Filled 2020-06-07 (×12): qty 5

## 2020-06-07 MED ORDER — HYDROCODONE BIT-HOMATROP MBR 5-1.5 MG/5ML PO SOLN
5.0000 mL | ORAL | Status: DC | PRN
  Administered 2020-06-07 – 2020-06-11 (×6): 5 mL via ORAL
  Filled 2020-06-07 (×6): qty 5

## 2020-06-07 MED ORDER — DEXTROMETHORPHAN POLISTIREX ER 30 MG/5ML PO SUER: 15.0000 mg | Freq: Three times a day (TID) | ORAL | Status: DC

## 2020-06-07 NOTE — Evaluation (Signed)
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  Has a history of left hip prosthesis that has been complicated by multiple revisions due to subluxation and recurrent left hip seroma.  He had a fall in November 2021 with a left trochanteric femur fracture and status post ORIF in December 2021.  In early January 2022 , positive for  COVID-19 .  ORIF in mid January 2022 due to periprosthetic fracture.  S/P incision and drainage on May 01, 2020. Admitted  05/31/20 with progressive SOB,Left upper extremity duplex negative for DVT,  CT angiogram chest negative for pulmonary embolus but showed mild cardiomegaly and bilateral small pleural effusions with bilateral patchy groundglass opacities especially in the lower lobe  Clinical Impression  The patient  Restless in bed on NRB and HHFNC 15L, SPO2 95% when not moving or coughing. Noted dropping into mid 80% when mobilizing from bed  To recliner. Wife at bedside. Mod assistance  To Move to sitting  up  Onto bed edge, cues for safety. Patient  Somewhat impulsive. Patient stood  With min assistance and +2 for safety and lines. Patient reports no Left hip pain. Per patient's wife,  Mobility has been limited recently due to SOB, has been using RW. Pt admitted with above diagnosis.  Pt currently with functional limitations due to the deficits listed below (see PT Problem List). Pt will benefit from skilled PT to increase their independence and safety with mobility to allow discharge to the venue listed below.   Patient noted rolling over in bed and LLE moved into adduct and rotation. Efforts made by PT to support the leg and remind patient of posterior precautions, patient did not appear to be able to concentrate on anything beyond breathing.     Follow Up Recommendations Home health PT    Equipment  Recommendations  None recommended by PT    Recommendations for Other Services       Precautions / Restrictions Precautions Precautions: Fall Precaution Comments: left posterior hip precautions, on HFNC and NRB Restrictions LLE Weight Bearing: Weight bearing as tolerated      Mobility  Bed Mobility Overal bed mobility: Needs Assistance Bed Mobility: Supine to Sit     Supine to sit: Mod assist;HOB elevated     General bed mobility comments: extra time, multimodal cues, trying to reach for foot board upon sitting. Cues to push to bed edge with arms    Transfers Overall transfer level: Needs assistance Equipment used: Rolling walker (2 wheeled) Transfers: Sit to/from [facility] Sit to Stand: Mod assist;+2 safety/equipment Stand pivot transfers: Mod assist;+2 safety/equipment       General transfer comment: Steady assistnace multimodal cues for safety  Ambulation/Gait                Stairs            Wheelchair Mobility    Modified Rankin (Stroke Patients Only)       Balance Overall balance assessment: Needs assistance Sitting-balance support: Bilateral upper extremity supported;Feet supported Sitting balance-Leahy Scale: Fair     Standing balance support: During functional activity;Bilateral upper extremity supported Standing balance-Leahy Scale: Poor Standing balance comment: reliant on UEs and RW                             Pertinent Vitals/Pain Pain  Assessment: No/denies pain    Home Living Family/patient expects to be discharged to:: Private residence Living Arrangements: Spouse/significant other Available Help at Discharge: Family Type of Home: House Home Access: Level entry     Home Layout: One Scott: Environmental consultant - 2 wheels;Cane - single point      Prior Function           Comments: recently using Rw, limited ambuation due to SOB     Hand Dominance        Extremity/Trunk  Assessment        Lower Extremity Assessment Lower Extremity Assessment: LLE deficits/detail LLE Deficits / Details: appears ROM WFL,    Cervical / Trunk Assessment Cervical / Trunk Assessment: Normal  Communication      Cognition Arousal/Alertness: Awake/alert Behavior During Therapy: Restless Overall Cognitive Status: Impaired/Different from baseline Area of Impairment: Memory;Orientation;Safety/judgement                 Orientation Level: Time   Memory: Decreased recall of precautions;Decreased short-term memory   Safety/Judgement: Decreased awareness of deficits     General Comments: states he will be ready to go home soon, repeated questions, frequent cues to stay on task.      General Comments      Exercises     Assessment/Plan    PT Assessment Patient needs continued PT services  PT Problem List Decreased strength;Decreased mobility;Decreased safety awareness;Decreased activity tolerance;Decreased cognition;Cardiopulmonary status limiting activity;Decreased balance;Decreased knowledge of use of DME       PT Treatment Interventions DME instruction;Therapeutic activities;Cognitive remediation;Gait training;Therapeutic exercise;Patient/family education;Functional mobility training    PT Goals (Current goals can be found in the Care Plan section)  Acute Rehab PT Goals Patient Stated Goal: to go home soon PT Goal Formulation: With patient/family Time For Goal Achievement: 06/21/20 Potential to Achieve Goals: Good    Frequency Min 3X/week   Barriers to discharge        Co-evaluation PT/OT/SLP Co-Evaluation/Treatment: Yes Reason for Co-Treatment: For patient/therapist safety;Complexity of the patient's impairments (multi-system involvement) PT goals addressed during session: Mobility/safety with mobility OT goals addressed during session: ADL's and self-care       AM-PAC PT "6 Clicks" Mobility  Outcome Measure Help needed turning from your back  to your side while in a flat bed without using bedrails?: A Lot Help needed moving from lying on your back to sitting on the side of a flat bed without using bedrails?: A Lot Help needed moving to and from a bed to a chair (including a wheelchair)?: A Lot Help needed standing up from a chair using your arms (e.g., wheelchair or bedside chair)?: A Lot Help needed to walk in hospital room?: Total Help needed climbing 3-5 steps with a railing? : Total 6 Click Score: 10    End of Session Equipment Utilized During Treatment: Gait belt Activity Tolerance: Treatment limited secondary to medical complications (Comment) Patient left: in chair;with call bell/phone within reach;with family/visitor present;with nursing/sitter in room Nurse Communication: Mobility status PT Visit Diagnosis: Unsteadiness on feet (R26.81);Muscle weakness (generalized) (M62.81);Difficulty in walking, not elsewhere classified (R26.2)    Time: 2951-8841 PT Time Calculation (min) (ACUTE ONLY): 17 min   Charges:   PT Evaluation $PT Eval Low Complexity: [facility] PT Acute Rehabilitation Services Pager [phone] Office [phone]  Claretha Cooper 06/07/2020, 12:44 PM

## 2020-06-07 NOTE — Progress Notes (Signed)
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  Has a history of left hip prosthesis that has been complicated by multiple revisions due to subluxation and recurrent left hip seroma.  He had a fall in November 2021 with a left trochanteric femur fracture and status post ORIF in December 2021.  In early January 2022 , pre-op COVID-19 test was POS.  Subsequently had a second ORIF in mid January 2022 due to periprosthetic fracture.  Recommend incision and drainage on May 01, 2020.  At this point in time placed on daptomycin and ceftriaxone for 6 weeks upper extremity PICC line.  Admitted on 05/31/2020 [1 month into daptomycin and ceftriaxone treatment] with clinically left PICC line related suspected cellulitis but also cough and shortness of breath [he also had cough even before daptomycin] but the cough is worsened over 2 weeks prior to admission.  Left upper extremity duplex negative for DVT at admission.  CT angiogram chest negative for pulmonary embolus but showed mild cardiomegaly and bilateral small pleural effusions with bilateral patchy groundglass opacities especially in the lower lobe.  Respiratory virus panel negative.  Seen by infectious disease consultation and orthopedics 06/01/2020 and antibiotics changed.  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  Course since admission  -Appears that entry into the ER was 91% on room air.  Somewhere along the way he was on 2 L nasal cannula.  But on the evening of 06/02/2020 he started requiring more oxygen and transferred to stepdown.  On the morning of 06/03/2020 was getting somnolent and requiring 15 L nasal cannula with easy desaturations and significant tachypnea.   Critical care medicine ordered BiPAP and he had rapid improvement in mental status.  Pulse ox 99% on 60% FiO2 on BiPAP   Antibiotic history   Daptomycin 3/24 >> (stopped4/20/22, intent was to stop 06/14/20) Ceftriaxone 3/24 >> (Stopped 05/30/20, intent was to stop 06/14/20) xxxx Vanc 4/21>> Cefepime 4/21 >> Azithro 4/23  >>4/28   Peripheral eos hx  Results for ORVILLE, MENA (MRN [id number]) as of 06/03/2020 08:29  Ref. Range 01/17/2020 12:20 01/17/2020 14:56 01/17/2020 15:37 01/18/2020 03:28 01/19/2020 03:30 02/07/2020 08:46 02/27/2020 09:50 02/27/2020 13:39 02/28/2020 03:39 02/29/2020 03:33 05/01/2020 13:51 05/01/2020 17:29 05/02/2020 08:55 05/03/2020 08:37 05/04/2020 09:15 05/31/2020 17:42 06/01/2020 03:21 06/02/2020 06:07 06/03/2020 03:21  Eosinophils Absolute Latest Ref Range: 0.0 - 0.5 K/uL                0.3  0.0     Pertinent  Medical History     has a past medical history of Benign localized prostatic hyperplasia with lower urinary tract symptoms (LUTS), Complication of anesthesia, Diverticulosis of colon, History of colon polyps, History of kidney stones, History of MRSA infection (2007), History of urinary retention (05/31/2017), Incomplete emptying of bladder, OA (osteoarthritis), Peripheral neuropathy, Pinched nerve in neck, Wears glasses, and Wears hearing aid in both ears.   Significant Hospital Events: Including procedures, antibiotic start and stop dates in addition to other pertinent events   . 05/31/2020 - admit.  91% oxygen on room air . 06/01/2020: 91% oxygen on room air.  Dr. Alvan Dame orthopedic consult: Recurrent left hip seroma.  Antibiotic plan per infectious diseases.  If no IV antibiotic  planned and recommended doxycycline for suppression for 6 months at least and consider IR drain to the left hip o Left upper extremity PICC line removed . 4/22 ID consut - recopmmend dc pic line due to cellulitis concern and marked upper extremity edema and follow-up blood cultures.  Consider you cephalic  pneumonia. . 4/23 - speech eval -regular diet, think liquids . 4/25 off bipap 10pm . 4/26 on high flow nasal cannula plus nonrebreather    Interim History / Subjective:   Remains critically ill requiring HFNC and nonrebreather, but stable. Did not tolerate prolonged time off nonrebreather yesterday Intense coughing this morning after having breakfast  Objective   Blood pressure (!) 142/65, pulse (!) 57, temperature (!) 97.5 F (36.4 C), temperature source Oral, resp. rate 13, height 6\' 7"  (2.007 m), weight (!) 149.4 kg, SpO2 90 %.    FiO2 (%):  [100 %] 100 %   Intake/Output Summary (Last 24 hours) at 06/07/2020 1003 Last data filed at 06/07/2020 0800 Gross per 24 hour  Intake 528.43 ml  Output 1800 ml  Net -1271.57 ml   Filed Weights   05/31/20 1741 06/02/20 2100  Weight: 136.1 kg (!) 149.4 kg    Examination: General: Obese male on HFNC + NRB , positive affect HENT: No pallor, no JVD Lungs: No accessory muscle use, scattered crackles bilateral Cardiovascular: Regular rate and rhythm Abdomen: Obese and soft nontender Extremities: No cyanosis no clubbing , 1+ weeping edema left forearm and hand Neuro: Alert and interactive, grossly nonfocal   Labs/imaging that I havepersonally reviewed  (right click and "Reselect all SmartList Selections" daily)   Labs show normal electrolytes, decrease sugars, stable leukocytosis and anemia  Chest x-ray 4/23 shows moderate patchy bilateral interstitial airspace disease. Small effusions.  CTA chest 4/21 small bilateral effusions, bilateral patchy groundglass opacity?  Multilobar pneumonia.  CT chest without contrast 4/24 persistent bilateral multifocal and groundglass opacities, no effusions  Echo 4/24 >> nml LVEF , gr 1 DD, asc aorta 37mm  Urine Legionella negative Urine strep antigen negative  Resolved Hospital Problem list     Assessment & Plan:   Acute Hypoxemic Respiratory Failure - severe, needing BiPAP and high  fio2.  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, male and been on daptomyicin > 10 days, good response to steroids started 4/24  - RVP - negative  -Improvement in hypoxia seems to have stalled   -Doubt COVID lung,  he was asymptomatic when he had positive preop testing, COVID IgG positive  be due to prior infection, clarified that he is vaccinated and boosted  Plan  -He completed 3 days of 0.5 g steroids daily, now will drop to 80 every 8 -Continue high flow nasal cannula and attempt to discontinue nonrebreather -Await QuantiFERON gold for completion -Aggressive antitussives with Delsym, Tessalon and Hycodan cough syrup   Steroid-induced hyperglycemia  -Controlled with 10 units of Lantus and SSI resistant scale  Left upper extremity thrombophlebitis  -, keep limb elevated, duplex neg 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 -was on ceftriaxone and daptomycin, completed 7 days of vancomycin and cefepime for HCAP , now on cefdinir and doxycycline   To mobilize out of bed to chair  Full code     LABS    PULMONARY Recent  Labs  Lab 06/02/20 2020 06/03/20 0540  PHART 7.383 7.367  PCO2ART 34.8 40.1  PO2ART 75.8* 70.1*  HCO3 20.3 22.5  O2SAT 94.2 92.7    CBC Recent Labs  Lab 06/05/20 0311 06/06/20 0930 06/07/20 0301  HGB 8.8* 8.9* 8.4*  HCT 29.5* 28.9* 28.3*  WBC 18.7* 15.0* 14.5*  PLT 339 213 250    COAGULATION Recent Labs  Lab 05/31/20 2026 06/01/20 0321  INR 1.2 1.3*    CARDIAC  No results for input(s): TROPONINI in the last 168 hours. No results for input(s): PROBNP in the last 168 hours.   CHEMISTRY Recent Labs  Lab 06/03/20 0321 06/04/20 0342 06/05/20 0311 06/06/20 0711 06/07/20 0301  NA 137 140 138 136 138  K 4.0 4.1  4.1 4.5 4.4  CL 104 109 109 108 110  CO2 23 22 22  18* 20*  GLUCOSE 129* 147* 156* 127* 124*  BUN 45* 50* 55* 60* 59*  CREATININE 1.36* 1.17 1.11 1.07 1.09  CALCIUM 8.2* 8.3* 8.3* 7.9* 8.0*   Estimated Creatinine Clearance: 94.6 mL/min (by C-G formula based on SCr of 1.09 mg/dL).   LIVER Recent Labs  Lab 05/31/20 2026 06/01/20 0321  AST 26 23  ALT 20 18  ALKPHOS 126 107  BILITOT 0.5 0.5  PROT 7.0 6.0*  ALBUMIN 3.1* 2.8*  INR 1.2 1.3*     INFECTIOUS Recent Labs  Lab 05/31/20 2026 05/31/20 2230 06/01/20 0321 06/03/20 1122 06/04/20 0342 06/05/20 0311  LATICACIDVEN 2.0* 1.5  --   --   --   --   PROCALCITON  --   --    < > 2.55 1.65 0.94   < > = values in this interval not displayed.     ENDOCRINE CBG (last 3)  Recent Labs    06/07/20 0010 06/07/20 0336 06/07/20 0727  GLUCAP 127* 138* 124*      ATTESTATION & SIGNATURE      Kara Mead MD. FCCP. [facility] Pager : [phone]  If no response to pager , please call [phone] until 7 pm After 7:00 pm call Elink  [phone]     06/07/2020

## 2020-06-07 NOTE — Progress Notes (Signed)
Occupational Therapy Evaluation  Patient lives with spouse in a single level house with level entry, prior to onset of shortness of breath patient primarily mod I with use of walker. Patient does appear to have poor awareness of hip precautions this admission, observed patient adducting legs significantly in bed prior to eval. Spouse states as patient's shortness of breath increased at home could only tolerate "lap around the dining room" before having to rest. Currently patient needing increased assist with self care + transfers due to cardiopulmonary status limiting activity tolerance as well as patient's poor safety awareness and impaired cognition. Patient repeatedly asking questions and has to be redirected multiple times at edge of bed not to stand before therapy ready, appears to be having some memory deficits. Recommend continued acute OT services for education of compensatory strategies, adaptive equipment to ensure adherence to hip precautions and maximize patient overall endurance in order to facilitate D/C home with spouse and [facility] services.    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  Has a history of left hip prosthesis that has been complicated by multiple revisions due to subluxation and recurrent left hip seroma.  He had a fall in November 2021 with a left trochanteric femur fracture and status post ORIF in December 2021.  In early January 2022 , positive for  COVID-19 .  ORIF in mid January 2022 due to periprosthetic fracture.  S/P incision and drainage on May 01, 2020. Admitted  05/31/20 with progressive SOB,Left upper extremity duplex negative for DVT,  CT angiogram chest  negative for pulmonary embolus but showed mild cardiomegaly and bilateral small pleural effusions with bilateral patchy groundglass opacities especially in the lower lobe  Precautions  Precautions Fall  Precaution Comments left posterior hip precautions, on HFNC and NRB  Restrictions  LLE Weight Bearing WBAT  Home Living  Family/patient expects to be discharged to: Private residence  Living Arrangements Spouse/significant other  Available Help at Discharge Family  Type of [place] Access Level entry  [place] One level  Bathroom Shower/Tub Walk-in shower  [place] - 2 wheels;Cane - single point  Prior Function  Level of Independence Independent  Comments recently using Rw, limited ambuation due to SOB  Communication  Communication No difficulties  Pain Assessment  Pain Assessment No/denies pain  Cognition  Arousal/Alertness Awake/alert  Behavior During Therapy Restless  Overall Cognitive Status Impaired/Different from baseline  Area of Impairment Memory;Orientation;Safety/judgement  Orientation Level Disoriented to;Time  Memory Decreased recall of precautions;Decreased short-term memory  Safety/Judgement Decreased awareness of deficits  General Comments states he will be ready to go home soon, repeated questions, frequent cues to stay on task.  Upper Extremity Assessment  Upper Extremity Assessment Overall WFL for tasks assessed  Lower Extremity Assessment  Lower Extremity Assessment Defer to PT evaluation  Cervical / Trunk Assessment  Cervical / Trunk Assessment Normal  ADL  Overall ADL's  Needs assistance/impaired  Grooming Set up;Sitting  Upper Body Bathing Set up;Sitting  Lower Body Bathing Maximal assistance;Sit to/from stand;Sitting/lateral leans  Lower Body Bathing Details (indicate cue type and reason) d/t hip precautions  Upper Body Dressing  Set up;Sitting  Lower Body Dressing Total  assistance;Sitting/lateral leans;Sit to/from stand  Lower Body Dressing Details (indicate cue type and reason) d/t hip precautions  Toilet Transfer Moderate assistance;+2 for safety/equipment;Stand-pivot;Cueing for safety;Cueing for sequencing;BSC  Toilet Transfer Details (indicate cue type and reason) to recliner, cues for body mechanics and safety backing up to chair. mod A to power up to standing from elevated bed height  Toileting- Clothing Manipulation and Hygiene Moderate assistance;Sit to/from stand;Sitting/lateral lean  Functional mobility during ADLs Moderate assistance;+2 for safety/equipment;Rolling walker  General ADL Comments patient appears to have poor awareness of hip precautions, observed patient during use of bed pad prior to therapy with patient's legs adducted  Bed Mobility  Overal bed mobility Needs Assistance  Bed Mobility Supine to Sit  Supine to sit Mod assist;HOB elevated  General bed mobility comments extra time, multimodal cues, trying to reach for foot board upon sitting. Cues to push to bed edge with arms  Transfers  Overall transfer level Needs assistance  Equipment used Rolling walker (2 wheeled)  Transfers Sit to/from [employer] Transfers  Sit to Stand Mod assist;+2 safety/equipment  Stand pivot transfers Mod assist;+2 safety/equipment  General transfer comment mod A to power up to standing from elevated bed height, multimodal cues for safety  Balance  Overall balance assessment Needs assistance  Sitting-balance support Bilateral upper extremity supported;Feet supported  Sitting balance-Leahy Scale Poor  Standing balance support During functional activity;Bilateral upper extremity supported  Standing balance-Leahy Scale Poor  Standing balance comment reliant on UEs and RW  General Comments  General comments (skin integrity, edema, etc.) poor wave form at times however appeared to desat to 86% with 15L and NRB  OT - End of Session  Equipment Utilized  During Treatment Gait belt;Rolling walker;Oxygen  Activity Tolerance Patient tolerated treatment well  Patient left in chair;with call bell/phone within reach;with chair alarm set  Nurse Communication Mobility status  OT Assessment  OT Recommendation/Assessment Patient needs continued OT Services  OT Visit Diagnosis Other abnormalities of gait and mobility (R26.89);Unsteadiness on feet (R26.81);Other symptoms and signs involving cognitive function  OT Problem List Decreased activity tolerance;Impaired balance (sitting and/or standing);Decreased safety awareness;Cardiopulmonary status limiting activity;Obesity;Decreased cognition;Decreased knowledge of precautions;Decreased knowledge of use of DME or AE  OT Plan  OT Frequency (ACUTE ONLY) Min 2X/week  OT Treatment/Interventions (ACUTE ONLY) Self-care/ADL training;DME and/or AE instruction;Therapeutic activities;Cognitive remediation/compensation;Patient/family education;Balance training  AM-PAC OT "6 Clicks" Daily Activity Outcome Measure (Version 2)  Help from another person eating meals? 4  Help from another person taking care of personal grooming? 3  Help from another person toileting, which includes using toliet, bedpan, or urinal? 2  Help from another person bathing (including washing, rinsing, drying)? 2  Help from another person to put on and taking off regular upper body clothing? 3  Help from another person to put on and taking off regular lower body clothing? 1  6 Click Score 15  OT Recommendation  Follow Up Recommendations Home health OT;Supervision/Assistance - 24 hour  OT Equipment Other (comment);3 in 1 bedside commode (long handle adaptive equipment if does not have)  Individuals Consulted  Consulted and Agree with Results and Recommendations Patient  Acute Rehab OT Goals  Patient Stated Goal to go home soon  OT Goal Formulation With patient  Time For Goal Achievement 06/21/20  Potential to Achieve Goals Good  OT Time  Calculation  OT Start Time (ACUTE ONLY) 1004  OT Stop Time (ACUTE ONLY) 1021  OT Time Calculation (min) 17 min  OT General Charges  $OT Visit 1 Visit  OT Evaluation  $  OT Eval Low Complexity 1 Low  Written Expression  Dominant Hand  (did not specify)   Delbert Phenix OT OT pager: [phone]

## 2020-06-07 NOTE — Progress Notes (Signed)
Subjective:  On nonrebreather he needs to go to the bathroom which will require assistance and he finds very embarrassing  Antibiotics:  Anti-infectives (From admission, onward)   Start     Dose/Rate Route Frequency Ordered Stop   06/07/20 1000  cefdinir (OMNICEF) capsule 300 mg        300 mg Oral Every 12 hours 06/06/20 1002     06/07/20 1000  doxycycline (VIBRA-TABS) tablet 100 mg        100 mg Oral Every 12 hours 06/06/20 1002     06/03/20 1800  vancomycin (VANCOREADY) IVPB 1000 mg/200 mL        1,000 mg 200 mL/hr over 60 Minutes Intravenous Every 12 hours 06/03/20 0838 06/06/20 1945   06/03/20 1030  azithromycin (ZITHROMAX) 500 mg in sodium chloride 0.9 % 250 mL IVPB  Status:  Discontinued        500 mg 250 mL/hr over 60 Minutes Intravenous Every 24 hours 06/03/20 0931 06/06/20 0758   06/03/20 1000  azithromycin (ZITHROMAX) tablet 250 mg  Status:  Discontinued       "Followed by" Linked Group Details   250 mg Oral Daily 06/02/20 0942 06/03/20 0931   06/02/20 1030  azithromycin (ZITHROMAX) tablet 500 mg       "Followed by" Linked Group Details   500 mg Oral Daily 06/02/20 0942 06/02/20 1042   06/02/20 0000  vancomycin (VANCOREADY) IVPB 1500 mg/300 mL  Status:  Discontinued        1,500 mg 150 mL/hr over 120 Minutes Intravenous Every 24 hours 06/01/20 0129 06/03/20 0838   06/01/20 0800  ceFEPIme (MAXIPIME) 2 g in sodium chloride 0.9 % 100 mL IVPB        2 g 200 mL/hr over 30 Minutes Intravenous Every 8 hours 06/01/20 0129 06/06/20 1837   06/01/20 0400  vancomycin (VANCOREADY) IVPB 1000 mg/200 mL  Status:  Discontinued        1,000 mg 200 mL/hr over 60 Minutes Intravenous  Once 06/01/20 0300 06/01/20 0307   06/01/20 0400  ceFEPIme (MAXIPIME) 2 g in sodium chloride 0.9 % 100 mL IVPB  Status:  Discontinued        2 g 200 mL/hr over 30 Minutes Intravenous  Once 06/01/20 0300 06/01/20 0307   05/31/20 2245  vancomycin (VANCOCIN) IVPB 1000 mg/200 mL premix  Status:   Discontinued        1,000 mg 200 mL/hr over 60 Minutes Intravenous  Once 05/31/20 2232 05/31/20 2235   05/31/20 2245  ceFEPIme (MAXIPIME) 2 g in sodium chloride 0.9 % 100 mL IVPB        2 g 200 mL/hr over 30 Minutes Intravenous  Once 05/31/20 2232 06/01/20 0132   05/31/20 2245  vancomycin (VANCOREADY) IVPB 2000 mg/400 mL        2,000 mg 200 mL/hr over 120 Minutes Intravenous  Once 05/31/20 2235 06/01/20 0233      Medications: Scheduled Meds: . benzonatate  200 mg Oral TID  . cefdinir  300 mg Oral Q12H  . Chlorhexidine Gluconate Cloth  6 each Topical Daily  . dextromethorphan  30 mg Oral BID  . doxycycline  100 mg Oral Q12H  . enoxaparin (LOVENOX) injection  75 mg Subcutaneous Q24H  . insulin aspart  3-9 Units Subcutaneous TID WC  . insulin detemir  10 Units Subcutaneous Daily  . methylPREDNISolone (SOLU-MEDROL) injection  80 mg Intravenous Q8H  . traZODone  100 mg Oral QHS  Continuous Infusions: . sodium chloride 10 mL/hr at 06/07/20 0800   PRN Meds:.sodium chloride, acetaminophen **OR** acetaminophen, HYDROcodone bit-homatropine, ipratropium-albuterol, ondansetron **OR** ondansetron (ZOFRAN) IV, phenol    Objective: Weight change:   Intake/Output Summary (Last 24 hours) at 06/07/2020 1423 Last data filed at 06/07/2020 0800 Gross per 24 hour  Intake 479.66 ml  Output 1800 ml  Net -1320.34 ml   Blood pressure (!) 169/84, pulse (!) 57, temperature 97.7 F (36.5 C), temperature source Oral, resp. rate 17, height 6\' 7"  (2.007 m), weight (!) 149.4 kg, SpO2 96 %. Temp:  [97.2 F (36.2 C)-98.6 F (37 C)] 97.7 F (36.5 C) (04/28 1125) Pulse Rate:  [47-67] 57 (04/28 1000) Resp:  [13-30] 17 (04/28 1000) BP: (96-169)/(38-93) 169/84 (04/28 1000) SpO2:  [78 %-96 %] 96 % (04/28 1000) FiO2 (%):  [100 %] 100 % (04/28 0000)  Physical Exam: Physical Exam Constitutional:      Appearance: He is well-developed.  HENT:     Head: Normocephalic and atraumatic.  Eyes:      Conjunctiva/sclera: Conjunctivae normal.  Cardiovascular:     Rate and Rhythm: Regular rhythm. Tachycardia present.     Heart sounds: No murmur heard. No friction rub. No gallop.   Pulmonary:     Effort: No respiratory distress.     Breath sounds: No stridor. No wheezing.  Abdominal:     General: There is no distension.     Palpations: Abdomen is soft.  Musculoskeletal:        General: Normal range of motion.     Cervical back: Normal range of motion and neck supple.  Skin:    General: Skin is warm and dry.     Coloration: Skin is not pale.     Findings: No erythema or rash.  Neurological:     General: No focal deficit present.     Mental Status: He is alert and oriented to person, place, and time.  Psychiatric:        Mood and Affect: Mood normal.        Behavior: Behavior normal.        Thought Content: Thought content normal.        Judgment: Judgment normal.      CBC:    BMET Recent Labs    06/06/20 0711 06/07/20 0301  NA 136 138  K 4.5 4.4  CL 108 110  CO2 18* 20*  GLUCOSE 127* 124*  BUN 60* 59*  CREATININE 1.07 1.09  CALCIUM 7.9* 8.0*     Liver Panel  No results for input(s): PROT, ALBUMIN, AST, ALT, ALKPHOS, BILITOT, BILIDIR, IBILI in the last 72 hours.     Sedimentation Rate No results for input(s): ESRSEDRATE in the last 72 hours. C-Reactive Protein No results for input(s): CRP in the last 72 hours.  Micro Results: Recent Results (from the past 720 hour(s))  Culture, blood (routine x 2)     Status: None   Collection Time: 05/31/20  7:58 PM   Specimen: BLOOD RIGHT HAND  Result Value Ref Range Status   Specimen Description   Final    BLOOD RIGHT HAND Performed at [facility] [address], [place], [place] [address]    Special Requests   Final    BOTTLES DRAWN AEROBIC AND ANAEROBIC Blood Culture adequate volume Performed at [facility] [address] [place], [place] [address]    Culture    Final    NO GROWTH 5 DAYS Performed at  [facility], [address], [place], [place] [address]    Report Status 06/06/2020 FINAL  Final  Culture, blood (Routine X 2) w Reflex to ID Panel     Status: None   Collection Time: 05/31/20  8:16 PM   Specimen: BLOOD  Result Value Ref Range Status   Specimen Description   Final    BLOOD RIGHT ANTECUBITAL Performed at [facility], [address] [place], [place] [address]    Special Requests   Final    BOTTLES DRAWN AEROBIC AND ANAEROBIC Blood Culture results may not be optimal due to an excessive volume of blood received in culture bottles Performed at [facility], [address] [place], [place] [address]    Culture   Final    NO GROWTH 5 DAYS Performed at [facility], [address], [place], [place] [address]    Report Status 06/06/2020 FINAL  Final  Culture, blood (routine x 2)     Status: None   Collection Time: 05/31/20  8:26 PM   Specimen: BLOOD RIGHT HAND  Result Value Ref Range Status   Specimen Description   Final    BLOOD RIGHT HAND Performed at [facility], [address], [place], [place] [address]    Special Requests   Final    BOTTLES DRAWN AEROBIC ONLY Blood Culture results may not be optimal due to an excessive volume of blood received in culture bottles Performed at [facility], [address], [place], [place] [address]    Culture   Final    NO GROWTH 5 DAYS Performed at [facility], [address], [place], [place] [address]    Report Status 06/06/2020 FINAL  Final  Respiratory (~20 pathogens) panel by PCR     Status: None   Collection Time: 05/31/20 10:02 PM   Specimen: Nasopharyngeal Swab; Respiratory  Result Value Ref Range Status   Adenovirus NOT DETECTED NOT DETECTED Final   Coronavirus 229E NOT DETECTED NOT DETECTED Final    Comment: (NOTE) The Coronavirus on the Respiratory Panel, DOES NOT test for the  novel  Coronavirus (2019 nCoV)    Coronavirus HKU1 NOT DETECTED NOT DETECTED Final   Coronavirus NL63 NOT DETECTED NOT DETECTED Final   Coronavirus OC43 NOT DETECTED NOT DETECTED Final   Metapneumovirus NOT DETECTED NOT DETECTED Final   Rhinovirus / Enterovirus NOT DETECTED NOT DETECTED Final   Influenza A NOT DETECTED NOT DETECTED Final   Influenza B NOT DETECTED NOT DETECTED Final   Parainfluenza Virus 1 NOT DETECTED NOT DETECTED Final   Parainfluenza Virus 2 NOT DETECTED NOT DETECTED Final   Parainfluenza Virus 3 NOT DETECTED NOT DETECTED Final   Parainfluenza Virus 4 NOT DETECTED NOT DETECTED Final   Respiratory Syncytial Virus NOT DETECTED NOT DETECTED Final   Bordetella pertussis NOT DETECTED NOT DETECTED Final   Bordetella Parapertussis NOT DETECTED NOT DETECTED Final   Chlamydophila pneumoniae NOT DETECTED NOT DETECTED Final   Mycoplasma pneumoniae NOT DETECTED NOT DETECTED Final    Comment: Performed at [facility], [address] [place], [place] [address]  Expectorated Sputum Assessment w Gram Stain, Rflx to Resp Cult     Status: None   Collection Time: 06/02/20  9:31 AM   Specimen: Sputum  Result Value Ref Range Status   Specimen Description SPUTUM  Final   Special Requests NONE  Final   Sputum evaluation   Final  THIS SPECIMEN IS ACCEPTABLE FOR SPUTUM CULTURE Performed at [facility], [place] [address], [place], [place] [address]    Report Status 06/04/2020 FINAL  Final  Culture, Respiratory w Gram Stain     Status: None   Collection Time: 06/02/20  9:31 AM   Specimen: SPU  Result Value Ref Range Status   Specimen Description   Final    SPUTUM Performed at [facility] [address] [place], [place] [address]    Special Requests   Final    NONE Reflexed from [id number] Performed at [facility], [place] [address] [place], [place] [address]    Gram Stain   Final    RARE WBC PRESENT,BOTH PMN AND  MONONUCLEAR FEW YEAST FEW GRAM POSITIVE COCCI IN PAIRS RARE GRAM NEGATIVE RODS    Culture   Final    FEW CANDIDA ALBICANS WITHIN NORMAL RESPIRATORY FLORA Performed at [facility], [place] [address] [place], [place] [address]    Report Status 06/05/2020 FINAL  Final  MRSA PCR Screening     Status: None   Collection Time: 06/02/20  9:59 PM   Specimen: Nasal Mucosa; Nasopharyngeal  Result Value Ref Range Status   MRSA by PCR NEGATIVE NEGATIVE Final    Comment:        The GeneXpert MRSA Assay (FDA approved for NASAL specimens only), is one component of a comprehensive MRSA colonization surveillance program. It is not intended to diagnose MRSA infection nor to guide or monitor treatment for MRSA infections. Performed at [facility], [place] [address], [place], [place] [address]     Studies/Results: DG CHEST PORT 1 VIEW  Result Date: 06/07/2020 CLINICAL DATA:  Acute respiratory failure with hypoxia EXAM: PORTABLE CHEST 1 VIEW COMPARISON:  06/02/2020 FINDINGS: Improvement in vascular congestion. Improvement in perihilar airspace disease bilaterally. Bibasilar atelectasis/infiltrate is unchanged. Possible small effusions. IMPRESSION: Improvement in vascular congestion and probable mild edema. Persistent bibasilar atelectasis/infiltrate and small effusions. Electronically Signed   By: Franchot Gallo M.D.   On: 06/07/2020 11:54      Assessment/Plan:  INTERVAL HISTORY:    Back on nonrebreather  Principal Problem:   Sepsis ([place]) Active Problems:   S/P left THA, AA   S/P revision of total hip   Obese   HCAP (healthcare-associated pneumonia)   Cellulitis   Acute hypoxemic respiratory failure ([place])   PICC line infection, initial encounter   Pulmonary eosinophilia ([place])   Drug-induced lung disease    Jacob Mitchell is a 75 y.o. 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  He was changed to vancomycin and cefepime. 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  He is on steroids  He also completed 7 days of therapy for pneumonia  x  #2 PJI: Change to doxycline and cefdinir tomorrow.  #3 Left arm edema where PICC was placed seems to have had infiltration of soft tissues and no DVT        LOS: 7 days   Alcide Evener 06/07/2020, 2:23 PM

## 2020-06-08 DIAGNOSIS — J9601 Acute respiratory failure with hypoxia: Secondary | ICD-10-CM | POA: Diagnosis not present

## 2020-06-08 DIAGNOSIS — J984 Other disorders of lung: Secondary | ICD-10-CM | POA: Diagnosis not present

## 2020-06-08 DIAGNOSIS — R2232 Localized swelling, mass and lump, left upper limb: Secondary | ICD-10-CM

## 2020-06-08 DIAGNOSIS — T50905A Adverse effect of unspecified drugs, medicaments and biological substances, initial encounter: Secondary | ICD-10-CM | POA: Diagnosis not present

## 2020-06-08 DIAGNOSIS — R7981 Abnormal blood-gas level: Secondary | ICD-10-CM

## 2020-06-08 DIAGNOSIS — T8459XD Infection and inflammatory reaction due to other internal joint prosthesis, subsequent encounter: Secondary | ICD-10-CM

## 2020-06-08 LAB — QUANTIFERON-TB GOLD PLUS

## 2020-06-08 LAB — BASIC METABOLIC PANEL
Anion gap: 8 (ref 5–15)
BUN: 52 mg/dL — ABNORMAL HIGH (ref 8–23)
CO2: 21 mmol/L — ABNORMAL LOW (ref 22–32)
Calcium: 7.8 mg/dL — ABNORMAL LOW (ref 8.9–10.3)
Chloride: 109 mmol/L (ref 98–111)
Creatinine, Ser: 1 mg/dL (ref 0.61–1.24)
GFR, Estimated: >60 mL/min (ref >60)
Glucose, Bld: 139 mg/dL — ABNORMAL HIGH (ref 70–99)
Potassium: 4.9 mmol/L (ref 3.5–5.1)
Sodium: 138 mmol/L (ref 135–145)

## 2020-06-08 LAB — GLUCOSE, CAPILLARY
Glucose-Capillary: 120 mg/dL — ABNORMAL HIGH (ref 70–99)
Glucose-Capillary: 139 mg/dL — ABNORMAL HIGH (ref 70–99)
Glucose-Capillary: 142 mg/dL — ABNORMAL HIGH (ref 70–99)
Glucose-Capillary: 146 mg/dL — ABNORMAL HIGH (ref 70–99)
Glucose-Capillary: 182 mg/dL — ABNORMAL HIGH (ref 70–99)

## 2020-06-08 LAB — CBC
HCT: 30 % — ABNORMAL LOW (ref 39.0–52.0)
Hemoglobin: 8.7 g/dL — ABNORMAL LOW (ref 13.0–17.0)
MCH: 23.8 pg — ABNORMAL LOW (ref 26.0–34.0)
MCHC: 29 g/dL — ABNORMAL LOW (ref 30.0–36.0)
MCV: 82 fL (ref 80.0–100.0)
Platelets: 251 10*3/uL (ref 150–400)
RBC: 3.66 MIL/uL — ABNORMAL LOW (ref 4.22–5.81)
RDW: 18 % — ABNORMAL HIGH (ref 11.5–15.5)
WBC: 13.9 10*3/uL — ABNORMAL HIGH (ref 4.0–10.5)
nRBC: 0.1 % (ref 0.0–0.2)

## 2020-06-08 NOTE — Progress Notes (Signed)
Subjective:  Patient feels better and also has a more optimistic outlook today.  He was telling us about the vivid dreams he had when he had low oxygen levels.  Antibiotics:  Anti-infectives (From admission, onward)   Start     Dose/Rate Route Frequency Ordered Stop   06/07/20 1000  cefdinir (OMNICEF) capsule 300 mg        300 mg Oral Every 12 hours 06/06/20 1002     06/07/20 1000  doxycycline (VIBRA-TABS) tablet 100 mg        100 mg Oral Every 12 hours 06/06/20 1002     06/03/20 1800  vancomycin (VANCOREADY) IVPB 1000 mg/200 mL        1,000 mg 200 mL/hr over 60 Minutes Intravenous Every 12 hours 06/03/20 0838 06/06/20 1945   06/03/20 1030  azithromycin (ZITHROMAX) 500 mg in sodium chloride 0.9 % 250 mL IVPB  Status:  Discontinued        500 mg 250 mL/hr over 60 Minutes Intravenous Every 24 hours 06/03/20 0931 06/06/20 0758   06/03/20 1000  azithromycin (ZITHROMAX) tablet 250 mg  Status:  Discontinued       "Followed by" Linked Group Details   250 mg Oral Daily 06/02/20 0942 06/03/20 0931   06/02/20 1030  azithromycin (ZITHROMAX) tablet 500 mg       "Followed by" Linked Group Details   500 mg Oral Daily 06/02/20 0942 06/02/20 1042   06/02/20 0000  vancomycin (VANCOREADY) IVPB 1500 mg/300 mL  Status:  Discontinued        1,500 mg 150 mL/hr over 120 Minutes Intravenous Every 24 hours 06/01/20 0129 06/03/20 0838   06/01/20 0800  ceFEPIme (MAXIPIME) 2 g in sodium chloride 0.9 % 100 mL IVPB        2 g 200 mL/hr over 30 Minutes Intravenous Every 8 hours 06/01/20 0129 06/06/20 1837   06/01/20 0400  vancomycin (VANCOREADY) IVPB 1000 mg/200 mL  Status:  Discontinued        1,000 mg 200 mL/hr over 60 Minutes Intravenous  Once 06/01/20 0300 06/01/20 0307   06/01/20 0400  ceFEPIme (MAXIPIME) 2 g in sodium chloride 0.9 % 100 mL IVPB  Status:  Discontinued        2 g 200 mL/hr over 30 Minutes Intravenous  Once 06/01/20 0300 06/01/20 0307   05/31/20 2245  vancomycin (VANCOCIN) IVPB  1000 mg/200 mL premix  Status:  Discontinued        1,000 mg 200 mL/hr over 60 Minutes Intravenous  Once 05/31/20 2232 05/31/20 2235   05/31/20 2245  ceFEPIme (MAXIPIME) 2 g in sodium chloride 0.9 % 100 mL IVPB        2 g 200 mL/hr over 30 Minutes Intravenous  Once 05/31/20 2232 06/01/20 0132   05/31/20 2245  vancomycin (VANCOREADY) IVPB 2000 mg/400 mL        2,000 mg 200 mL/hr over 120 Minutes Intravenous  Once 05/31/20 2235 06/01/20 0233      Medications: Scheduled Meds: . benzonatate  200 mg Oral TID  . cefdinir  300 mg Oral Q12H  . Chlorhexidine Gluconate Cloth  6 each Topical Daily  . dextromethorphan  30 mg Oral BID  . doxycycline  100 mg Oral Q12H  . enoxaparin (LOVENOX) injection  75 mg Subcutaneous Q24H  . insulin aspart  3-9 Units Subcutaneous TID WC  . insulin detemir  10 Units Subcutaneous Daily  . methylPREDNISolone (SOLU-MEDROL) injection  80 mg Intravenous  Q8H  . traZODone  100 mg Oral QHS   Continuous Infusions: . sodium chloride 10 mL/hr at 06/07/20 1200   PRN Meds:.sodium chloride, acetaminophen **OR** acetaminophen, HYDROcodone bit-homatropine, ipratropium-albuterol, ondansetron **OR** ondansetron (ZOFRAN) IV, phenol    Objective: Weight change:   Intake/Output Summary (Last 24 hours) at 06/08/2020 1604 Last data filed at 06/08/2020 1519 Gross per 24 hour  Intake 360 ml  Output 2150 ml  Net -1790 ml   Blood pressure (!) 137/93, pulse 64, temperature (!) 97 F (36.1 C), temperature source Oral, resp. rate 18, height 6\' 7"  (2.007 m), weight (!) 149.4 kg, SpO2 93 %. Temp:  [97 F (36.1 C)-98.6 F (37 C)] 97 F (36.1 C) (04/29 1200) Pulse Rate:  [49-150] 64 (04/29 1500) Resp:  [12-34] 18 (04/29 1500) BP: (80-153)/(51-122) 137/93 (04/29 1500) SpO2:  [81 %-98 %] 93 % (04/29 1500)  Physical Exam: Physical Exam Constitutional:      Appearance: He is well-developed.  HENT:     Head: Normocephalic and atraumatic.  Eyes:     Conjunctiva/sclera:  Conjunctivae normal.  Cardiovascular:     Rate and Rhythm: Regular rhythm. Tachycardia present.     Heart sounds: No murmur heard. No friction rub. No gallop.   Pulmonary:     Effort: No respiratory distress.     Breath sounds: No stridor. No wheezing.  Abdominal:     General: There is no distension.     Palpations: Abdomen is soft.  Musculoskeletal:        General: Normal range of motion.     Cervical back: Normal range of motion and neck supple.  Skin:    General: Skin is warm and dry.     Coloration: Skin is not pale.     Findings: No erythema or rash.  Neurological:     General: No focal deficit present.     Mental Status: He is alert and oriented to person, place, and time.  Psychiatric:        Mood and Affect: Mood normal.        Behavior: Behavior normal.        Thought Content: Thought content normal.        Judgment: Judgment normal.      CBC:    BMET Recent Labs    06/07/20 0301 06/08/20 0258  NA 138 138  K 4.4 4.9  CL 110 109  CO2 20* 21*  GLUCOSE 124* 139*  BUN 59* 52*  CREATININE 1.09 1.00  CALCIUM 8.0* 7.8*     Liver Panel  No results for input(s): PROT, ALBUMIN, AST, ALT, ALKPHOS, BILITOT, BILIDIR, IBILI in the last 72 hours.     Sedimentation Rate No results for input(s): ESRSEDRATE in the last 72 hours. C-Reactive Protein No results for input(s): CRP in the last 72 hours.  Micro Results: Recent Results (from the past 720 hour(s))  Culture, blood (routine x 2)     Status: None   Collection Time: 05/31/20  7:58 PM   Specimen: BLOOD RIGHT HAND  Result Value Ref Range Status   Specimen Description   Final    BLOOD RIGHT HAND Performed at [facility] [address] [place], [place] [address]    Special Requests   Final    BOTTLES DRAWN AEROBIC AND ANAEROBIC Blood Culture adequate volume Performed at [facility] [address] [place], [place] [address]    Culture   Final    NO GROWTH 5  DAYS  Performed at [facility], [place] [address], [place], [place] [address]    Report Status 06/06/2020 FINAL  Final  Culture, blood (Routine X 2) w Reflex to ID Panel     Status: None   Collection Time: 05/31/20  8:16 PM   Specimen: BLOOD  Result Value Ref Range Status   Specimen Description   Final    BLOOD RIGHT ANTECUBITAL Performed at [facility] [address], [place], [place] [address]    Special Requests   Final    BOTTLES DRAWN AEROBIC AND ANAEROBIC Blood Culture results may not be optimal due to an excessive volume of blood received in culture bottles Performed at [facility] [address] [place], [place] [address]    Culture   Final    NO GROWTH 5 DAYS Performed at [facility], [place] [address], [place], [place] [address]    Report Status 06/06/2020 FINAL  Final  Culture, blood (routine x 2)     Status: None   Collection Time: 05/31/20  8:26 PM   Specimen: BLOOD RIGHT HAND  Result Value Ref Range Status   Specimen Description   Final    BLOOD RIGHT HAND Performed at [facility] [address] [place], [place] [address]    Special Requests   Final    BOTTLES DRAWN AEROBIC ONLY Blood Culture results may not be optimal due to an excessive volume of blood received in culture bottles Performed at [facility] [address] [place], [place] [address]    Culture   Final    NO GROWTH 5 DAYS Performed at [facility], [place] [address], [place], [place] [address]    Report Status 06/06/2020 FINAL  Final  Respiratory (~20 pathogens) panel by PCR     Status: None   Collection Time: 05/31/20 10:02 PM   Specimen: Nasopharyngeal Swab; Respiratory  Result Value Ref Range Status   Adenovirus NOT DETECTED NOT DETECTED Final   Coronavirus 229E NOT DETECTED NOT DETECTED Final    Comment: (NOTE) The Coronavirus on the Respiratory Panel, DOES NOT test for the novel  Coronavirus  (2019 nCoV)    Coronavirus HKU1 NOT DETECTED NOT DETECTED Final   Coronavirus NL63 NOT DETECTED NOT DETECTED Final   Coronavirus OC43 NOT DETECTED NOT DETECTED Final   Metapneumovirus NOT DETECTED NOT DETECTED Final   Rhinovirus / Enterovirus NOT DETECTED NOT DETECTED Final   Influenza A NOT DETECTED NOT DETECTED Final   Influenza B NOT DETECTED NOT DETECTED Final   Parainfluenza Virus 1 NOT DETECTED NOT DETECTED Final   Parainfluenza Virus 2 NOT DETECTED NOT DETECTED Final   Parainfluenza Virus 3 NOT DETECTED NOT DETECTED Final   Parainfluenza Virus 4 NOT DETECTED NOT DETECTED Final   Respiratory Syncytial Virus NOT DETECTED NOT DETECTED Final   Bordetella pertussis NOT DETECTED NOT DETECTED Final   Bordetella Parapertussis NOT DETECTED NOT DETECTED Final   Chlamydophila pneumoniae NOT DETECTED NOT DETECTED Final   Mycoplasma pneumoniae NOT DETECTED NOT DETECTED Final    Comment: Performed at [facility], [place]. [address], [place], [place] [address]  Expectorated Sputum Assessment w Gram Stain, Rflx to Resp Cult     Status: None   Collection Time: 06/02/20  9:31 AM   Specimen: Sputum  Result Value Ref Range Status   Specimen Description SPUTUM  Final   Special Requests NONE  Final   Sputum evaluation   Final  THIS SPECIMEN IS ACCEPTABLE FOR SPUTUM CULTURE Performed at [facility], [place] [address], [place], [place] [address]    Report Status 06/04/2020 FINAL  Final  Culture, Respiratory w Gram Stain     Status: None   Collection Time: 06/02/20  9:31 AM   Specimen: SPU  Result Value Ref Range Status   Specimen Description   Final    SPUTUM Performed at [facility] [address] [place], [place] [address]    Special Requests   Final    NONE Reflexed from [id number] Performed at [facility], [place] [address] [place], [place] [address]    Gram Stain   Final    RARE WBC PRESENT,BOTH PMN AND MONONUCLEAR FEW  YEAST FEW GRAM POSITIVE COCCI IN PAIRS RARE GRAM NEGATIVE RODS    Culture   Final    FEW CANDIDA ALBICANS WITHIN NORMAL RESPIRATORY FLORA Performed at [facility], [place] [address] [place], [place] [address]    Report Status 06/05/2020 FINAL  Final  MRSA PCR Screening     Status: None   Collection Time: 06/02/20  9:59 PM   Specimen: Nasal Mucosa; Nasopharyngeal  Result Value Ref Range Status   MRSA by PCR NEGATIVE NEGATIVE Final    Comment:        The GeneXpert MRSA Assay (FDA approved for NASAL specimens only), is one component of a comprehensive MRSA colonization surveillance program. It is not intended to diagnose MRSA infection nor to guide or monitor treatment for MRSA infections. Performed at [facility], [place] [address], [place], [place] [address]     Studies/Results: DG CHEST PORT 1 VIEW  Result Date: 06/07/2020 CLINICAL DATA:  Acute respiratory failure with hypoxia EXAM: PORTABLE CHEST 1 VIEW COMPARISON:  06/02/2020 FINDINGS: Improvement in vascular congestion. Improvement in perihilar airspace disease bilaterally. Bibasilar atelectasis/infiltrate is unchanged. Possible small effusions. IMPRESSION: Improvement in vascular congestion and probable mild edema. Persistent bibasilar atelectasis/infiltrate and small effusions. Electronically Signed   By: Franchot Gallo M.D.   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 He was changed to vancomycin and cefepime. 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  He is on steroids  He also completed 7 days of therapy for pneumonia    #2 PJI: Changed to doxycline and cefdinir tomorrow.  #3 Left arm edema where PICC was placed seems to have had infiltration of soft tissues and no DVT  My partner Dr. Gale Journey is available for questions this weekend I will be back on Monday.      LOS: 8 days   Alcide Evener 06/08/2020, 4:04 PM

## 2020-06-08 NOTE — Progress Notes (Signed)
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  Has a history of left hip prosthesis that has been complicated by multiple revisions due to subluxation and recurrent left hip seroma.  He had a fall in November 2021 with a left trochanteric femur fracture and status post ORIF in December 2021.  In early January 2022 , pre-op COVID-19 test was POS.  Subsequently had a second ORIF in mid January 2022 due to periprosthetic fracture.  Recommend incision and drainage on May 01, 2020.  At this point in time placed on daptomycin and ceftriaxone for 6 weeks upper extremity PICC line.  Admitted on 05/31/2020 [1 month into daptomycin and ceftriaxone treatment] with clinically left PICC line related suspected cellulitis but also cough and shortness of breath [he also had cough even before daptomycin] but the cough is worsened over 2 weeks prior to admission.  Left upper extremity duplex negative for DVT at admission.  CT angiogram chest negative for pulmonary embolus but showed mild cardiomegaly and bilateral small pleural effusions with bilateral patchy groundglass opacities especially in the lower lobe.  Respiratory virus panel negative.  Seen by infectious disease consultation and orthopedics 06/01/2020 and antibiotics changed.  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  Course since admission  -Appears that entry into the ER was 91% on room air.  Somewhere along the way he was on 2 L nasal cannula.  But on the evening of 06/02/2020 he started requiring more oxygen and transferred to stepdown.  On the morning of 06/03/2020 was getting somnolent and requiring 15 L nasal cannula with easy desaturations and significant tachypnea.   Critical care medicine ordered BiPAP and he had rapid improvement in mental status.  Pulse ox 99% on 60% FiO2 on BiPAP   Antibiotic history   Daptomycin 3/24 >> (stopped4/20/22, intent was to stop 06/14/20) Ceftriaxone 3/24 >> (Stopped 05/30/20, intent was to stop 06/14/20) xxxx Vanc 4/21>> Cefepime 4/21 >> Azithro 4/23  >>4/28   Peripheral eos hx  Results for PASQUALINO, WITHERSPOON (MRN [id number]) as of 06/03/2020 08:29  Ref. Range 01/17/2020 12:20 01/17/2020 14:56 01/17/2020 15:37 01/18/2020 03:28 01/19/2020 03:30 02/07/2020 08:46 02/27/2020 09:50 02/27/2020 13:39 02/28/2020 03:39 02/29/2020 03:33 05/01/2020 13:51 05/01/2020 17:29 05/02/2020 08:55 05/03/2020 08:37 05/04/2020 09:15 05/31/2020 17:42 06/01/2020 03:21 06/02/2020 06:07 06/03/2020 03:21  Eosinophils Absolute Latest Ref Range: 0.0 - 0.5 K/uL                0.3  0.0     Pertinent  Medical History     has a past medical history of Benign localized prostatic hyperplasia with lower urinary tract symptoms (LUTS), Complication of anesthesia, Diverticulosis of colon, History of colon polyps, History of kidney stones, History of MRSA infection (2007), History of urinary retention (05/31/2017), Incomplete emptying of bladder, OA (osteoarthritis), Peripheral neuropathy, Pinched nerve in neck, Wears glasses, and Wears hearing aid in both ears.   Significant Hospital Events: Including procedures, antibiotic start and stop dates in addition to other pertinent events   . 05/31/2020 - admit.  91% oxygen on room air . 06/01/2020: 91% oxygen on room air.  Dr. Alvan Dame orthopedic consult: Recurrent left hip seroma.  Antibiotic plan per infectious diseases.  If no IV antibiotic  planned and recommended doxycycline for suppression for 6 months at least and consider IR drain to the left hip o Left upper extremity PICC line removed . 4/22 ID consut - recopmmend dc pic line due to cellulitis concern and marked upper extremity edema and follow-up blood cultures.  Consider you cephalic  pneumonia. . 4/23 - speech eval -regular diet, think liquids . 4/25 off bipap 10pm . 4/26 on high flow nasal cannula plus nonrebreather . 4/28 decrease oxygen requirements to high flow nasal cannula by afternoon    Interim History / Subjective:   Improving gradually. Was able to stay on high flow nasal cannula alone yesterday and down to 10 L, back up to 15 L this morning due to coughing fit. Was able to sleep Afebrile  Objective   Blood pressure (!) 121/96, pulse 73, temperature 97.7 F (36.5 C), temperature source Oral, resp. rate (!) 27, height 6\' 7"  (2.007 m), weight (!) 149.4 kg, SpO2 94 %.        Intake/Output Summary (Last 24 hours) at 06/08/2020 1031 Last data filed at 06/08/2020 9833 Gross per 24 hour  Intake 759.98 ml  Output 1850 ml  Net -1090.02 ml   Filed Weights   05/31/20 1741 06/02/20 2100  Weight: 136.1 kg (!) 149.4 kg    Examination: General: Obese male on HFNC 8 to 10 L, no distress HENT: No pallor, no JVD Lungs: Bibasal crackles, no rhonchi, no accessory muscle use Cardiovascular: Regular rate and rhythm Abdomen: Obese and soft nontender Extremities: No cyanosis no clubbing , 1+ weeping edema left forearm and hand Neuro: Alert, interactive, able to speak in full sentences, nonfocal   Labs/imaging that I havepersonally reviewed  (right click and "Reselect all SmartList Selections" daily)   Labs show normal electrolytes, stable renal function, decreasing leukocytosis  Chest x-ray 4/ 28 show mild improvement in bilateral airspace disease, small bilateral effusions  CTA chest 4/21 small bilateral effusions, bilateral patchy groundglass opacity?  Multilobar pneumonia.  CT chest without contrast 4/24 persistent bilateral multifocal and groundglass opacities, no effusions  Echo 4/24 >> nml LVEF , gr 1 DD, asc aorta 10mm  Urine Legionella negative Urine strep antigen negative  Resolved Hospital Problem list     Assessment & Plan:   Acute  Hypoxemic Respiratory Failure - severe, needing BiPAP and high fio2.  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, male and been on daptomyicin > 10 days, slow but definite response to steroids started 4/24  - RVP - negative   -Doubt COVID lung,  he was asymptomatic when he had positive preop testing, COVID IgG positive  be due to prior infection, clarified that he is vaccinated and boosted  Plan  -After 3 days of 0.5 g steroids daily, dropped to 80 Solu-Medrol every 8 -Continue high flow nasal cannula and attempt to decrease FiO2 -Await QuantiFERON gold for completion -Aggressive antitussives with Delsym, Tessalon and Hycodan cough syrup   Steroid-induced hyperglycemia  -Controlled with 10 units of Lantus and SSI resistant scale  Left upper extremity thrombophlebitis  - keep limb elevated, duplex neg 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 -was on ceftriaxone and daptomycin, completed 7 days of vancomycin and cefepime for HCAP , now on cefdinir and doxycycline per ID   Full code Changed  to stepdown status , hope to transfer to floor soon     LABS    PULMONARY Recent Labs  Lab 06/02/20 2020 06/03/20 0540  PHART 7.383 7.367  PCO2ART 34.8 40.1  PO2ART 75.8* 70.1*  HCO3 20.3 22.5  O2SAT 94.2 92.7    CBC Recent Labs  Lab 06/06/20 0930 06/07/20 0301 06/08/20 0258  HGB 8.9* 8.4* 8.7*  HCT 28.9* 28.3* 30.0*  WBC 15.0* 14.5* 13.9*  PLT 213 250 251    COAGULATION No results for input(s): INR in the last 168 hours.  CARDIAC  No results for input(s): TROPONINI in the last 168 hours. No results for input(s): PROBNP in the last 168 hours.   CHEMISTRY Recent Labs  Lab 06/04/20 0342 06/05/20 0311 06/06/20 0711 06/07/20 0301 06/08/20 0258  NA  140 138 136 138 138  K 4.1 4.1 4.5 4.4 4.9  CL 109 109 108 110 109  CO2 22 22 18* 20* 21*  GLUCOSE 147* 156* 127* 124* 139*  BUN 50* 55* 60* 59* 52*  CREATININE 1.17 1.11 1.07 1.09 1.00  CALCIUM 8.3* 8.3* 7.9* 8.0* 7.8*   Estimated Creatinine Clearance: 103.1 mL/min (by C-G formula based on SCr of 1 mg/dL).   LIVER No results for input(s): AST, ALT, ALKPHOS, BILITOT, PROT, ALBUMIN, INR in the last 168 hours.   INFECTIOUS Recent Labs  Lab 06/03/20 1122 06/04/20 0342 06/05/20 0311  PROCALCITON 2.55 1.65 0.94     ENDOCRINE CBG (last 3)  Recent Labs    06/07/20 2022 06/08/20 0001 06/08/20 0754  GLUCAP 122* 146* 142*      ATTESTATION & SIGNATURE      Kara Mead MD. FCCP. [facility] Pager : [phone]  If no response to pager , please call [phone] until 7 pm After 7:00 pm call Elink  [phone]     06/08/2020

## 2020-06-08 NOTE — Progress Notes (Signed)
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  Has a history of left hip prosthesis that has been complicated by multiple revisions due to subluxation and recurrent left hip seroma.  He had a fall in November 2021 with a left trochanteric femur fracture and status post ORIF in December 2021.  In early January 2022 , positive for  COVID-19 .  ORIF in mid January 2022 due to periprosthetic fracture.  S/P incision and drainage on May 01, 2020. Admitted  05/31/20 with progressive SOB,Left upper extremity duplex negative for DVT,  CT angiogram chest negative for pulmonary embolus but showed mild cardiomegaly and bilateral small pleural effusions with bilateral patchy groundglass opacities especially in the lower lobe    PT Comments    Pt progressing toward PT goals; pt wife concerned about d/c home, managing pt at home; pt may benefit from SNF to maximize safety and independence prior to return home; will continue to follow  In acute setting.  Pt on 10L HFNC, SpO2=88-94%, RR up to 37 with standing activity. Fatigues quickly   Follow Up Recommendations  SNF     Equipment Recommendations  None recommended by PT    Recommendations for Other Services       Precautions / Restrictions Precautions Precautions: Fall Precaution Comments: left posterior hip precautions, on HFNC (does not typically adhere to THP) Restrictions LLE Weight Bearing: Weight bearing as tolerated    Mobility  Bed Mobility               General bed mobility comments: in recliner on arrival    Transfers Overall transfer level: Needs assistance Equipment used: Rolling walker (2 wheeled) Transfers: Sit to/from Stand Sit to Stand: Mod assist;Min assist;+2 physical assistance;+2 safety/equipment         General transfer comment: mod A to power up to  standing,  multimodal cues for safety.  Ambulation/Gait                 Stairs             Wheelchair Mobility    Modified Rankin (Stroke Patients Only)       Balance           Standing balance support: During functional activity;Bilateral upper extremity supported Standing balance-Leahy Scale: Poor Standing balance comment: reliant on UEs and RW                            Cognition Arousal/Alertness: Awake/alert Behavior During Therapy: Restless                     Orientation Level: Disoriented to;Time   Memory: Decreased recall of precautions;Decreased short-term memory                Exercises General Exercises - Lower Extremity Long Arc Quad: AROM;10 reps;Seated;Both Hip Flexion/Marching: AROM;Both;5 reps;Standing Heel Raises: AROM;Both;10 reps;Seated Other Exercises Other Exercises: IS reviewed and encouraged; pt return demo'd x 10    General Comments        Pertinent Vitals/Pain Pain Assessment: No/denies pain    Home Living                      Prior Function            PT Goals (current goals can now be  found in the care plan section) Acute Rehab PT Goals Patient Stated Goal: to go home soon PT Goal Formulation: With patient/family Time For Goal Achievement: 06/21/20 Potential to Achieve Goals: Good Progress towards PT goals: Progressing toward goals    Frequency    Min 3X/week      PT Plan Current plan remains appropriate    Co-evaluation              AM-PAC PT "6 Clicks" Mobility   Outcome Measure  Help needed turning from your back to your side while in a flat bed without using bedrails?: A Lot Help needed moving from lying on your back to sitting on the side of a flat bed without using bedrails?: A Lot Help needed moving to and from a bed to a chair (including a wheelchair)?: A Lot Help needed standing up from a chair using your arms (e.g., wheelchair or bedside chair)?:  A Lot Help needed to walk in hospital room?: A Lot Help needed climbing 3-5 steps with a railing? : Total 6 Click Score: 11    End of Session   Activity Tolerance: Patient limited by fatigue;Patient tolerated treatment well Patient left: in chair;with call bell/phone within reach;with family/visitor present Nurse Communication: Mobility status PT Visit Diagnosis: Unsteadiness on feet (R26.81);Muscle weakness (generalized) (M62.81);Difficulty in walking, not elsewhere classified (R26.2)     Time: 5427-0623 PT Time Calculation (min) (ACUTE ONLY): 26 min  Charges:  $Therapeutic Exercise: 8-22 mins $Therapeutic Activity: 8-22 mins                     Baxter Flattery, PT  Acute Rehab Dept ([place]) [phone] Pager [phone]  06/08/2020    [facility] 06/08/2020, 1:13 PM

## 2020-06-08 NOTE — TOC Progression Note (Signed)
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  Has a history of left hip prosthesis that has been complicated by multiple revisions due to subluxation and recurrent left hip seroma.  He had a fall in November 2021 with a left trochanteric femur fracture and status post ORIF in December 2021.  In early January 2022 , pre-op COVID-19 test was POS.  Subsequently had a second ORIF in mid January 2022 due to periprosthetic fracture.  Recommend incision and drainage on May 01, 2020.  At this point in time placed on daptomycin and ceftriaxone for 6 weeks upper extremity PICC line.  Admitted on 05/31/2020 [1 month into daptomycin and ceftriaxone treatment] with clinically left PICC line related suspected cellulitis but also cough and shortness of breath [he also had cough even before daptomycin] but the cough is worsened over 2 weeks prior to admission.  Left upper extremity duplex negative for DVT at admission.  CT angiogram chest negative for pulmonary embolus but showed mild cardiomegaly and bilateral small pleural effusions with bilateral patchy groundglass opacities especially in the lower lobe.  Respiratory virus panel negative.  Seen by infectious disease consultation and orthopedics 06/01/2020 and antibiotics changed.  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  Course since admission  -Appears that entry into the ER was 91% on room air.  Somewhere along the way he was on 2 L nasal cannula.  But on the evening of 06/02/2020 he started requiring more oxygen and transferred to stepdown.  On the morning of 06/03/2020 was getting somnolent and requiring 15 L nasal cannula with easy desaturations and significant tachypnea.  Critical care medicine  ordered BiPAP and he had rapid improvement in mental status.  Pulse ox 99% on 60% FiO2 on BiPAP [id number] hfnc at 10l/min Plan- home with hhome care and possible o2.  Expected Discharge Plan: [place] Barriers to Discharge: No Barriers Identified  Expected Discharge Plan and Services Expected Discharge Plan: [place] arrangements for the past 2 months: Single Family Home                                       Social Determinants of Health (SDOH) Interventions    Readmission Risk Interventions Readmission Risk Prevention Plan 05/03/2020  Post Dischage Appt Complete  Medication Screening Complete  Transportation Screening Complete  Some recent data might be hidden

## 2020-06-09 DIAGNOSIS — J984 Other disorders of lung: Secondary | ICD-10-CM | POA: Diagnosis not present

## 2020-06-09 DIAGNOSIS — T50905A Adverse effect of unspecified drugs, medicaments and biological substances, initial encounter: Secondary | ICD-10-CM | POA: Diagnosis not present

## 2020-06-09 DIAGNOSIS — J9601 Acute respiratory failure with hypoxia: Secondary | ICD-10-CM | POA: Diagnosis not present

## 2020-06-09 DIAGNOSIS — R7981 Abnormal blood-gas level: Secondary | ICD-10-CM

## 2020-06-09 DIAGNOSIS — L03114 Cellulitis of left upper limb: Secondary | ICD-10-CM | POA: Diagnosis not present

## 2020-06-09 DIAGNOSIS — A419 Sepsis, unspecified organism: Secondary | ICD-10-CM | POA: Diagnosis not present

## 2020-06-09 DIAGNOSIS — J8281 Chronic eosinophilic pneumonia: Secondary | ICD-10-CM

## 2020-06-09 LAB — GLUCOSE, CAPILLARY
Glucose-Capillary: 102 mg/dL — ABNORMAL HIGH (ref 70–99)
Glucose-Capillary: 110 mg/dL — ABNORMAL HIGH (ref 70–99)
Glucose-Capillary: 119 mg/dL — ABNORMAL HIGH (ref 70–99)
Glucose-Capillary: 132 mg/dL — ABNORMAL HIGH (ref 70–99)
Glucose-Capillary: 133 mg/dL — ABNORMAL HIGH (ref 70–99)
Glucose-Capillary: 134 mg/dL — ABNORMAL HIGH (ref 70–99)

## 2020-06-09 MED ORDER — INSULIN DETEMIR 100 UNIT/ML ~~LOC~~ SOLN
5.0000 [IU] | Freq: Every day | SUBCUTANEOUS | Status: DC
  Administered 2020-06-10: 5 [IU] via SUBCUTANEOUS
  Filled 2020-06-09: qty 0.05

## 2020-06-09 MED ORDER — METHYLPREDNISOLONE SODIUM SUCC 125 MG IJ SOLR
80.0000 mg | Freq: Two times a day (BID) | INTRAMUSCULAR | Status: DC
  Administered 2020-06-09 – 2020-06-10 (×2): 80 mg via INTRAVENOUS
  Filled 2020-06-09 (×2): qty 2

## 2020-06-09 NOTE — Progress Notes (Signed)
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  Has a history of left hip prosthesis that has been complicated by multiple revisions due to subluxation and recurrent left hip seroma.  He had a fall in November 2021 with a left trochanteric femur fracture and status post ORIF in December 2021.  In early January 2022 , pre-op COVID-19 test was POS.  Subsequently had a second ORIF in mid January 2022 due to periprosthetic fracture.  Recommend incision and drainage on May 01, 2020.  At this point in time placed on daptomycin and ceftriaxone for 6 weeks upper extremity PICC line.  Admitted on 05/31/2020 [1 month into daptomycin and ceftriaxone treatment] with clinically left PICC line related suspected cellulitis but also cough and shortness of breath [he also had cough even before daptomycin] but the cough is worsened over 2 weeks prior to admission.  Left upper extremity duplex negative for DVT at admission.  CT angiogram chest negative for pulmonary embolus but showed mild cardiomegaly and bilateral small pleural effusions with bilateral patchy groundglass opacities especially in the lower lobe.  Respiratory virus panel negative.  Seen by infectious disease consultation and orthopedics 06/01/2020 and antibiotics changed.  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  Course since admission  -Appears that entry into the ER was 91% on room air.  Somewhere along the way he was on 2 L nasal cannula.  But on the evening of 06/02/2020 he started requiring more oxygen and transferred to stepdown.  On the morning of 06/03/2020 was getting somnolent and requiring 15 L nasal cannula with easy desaturations and significant tachypnea.   Critical care medicine ordered BiPAP and he had rapid improvement in mental status.  Pulse ox 99% on 60% FiO2 on BiPAP   Antibiotic history   Daptomycin 3/24 >> (stopped4/20/22, intent was to stop 06/14/20) Ceftriaxone 3/24 >> (Stopped 05/30/20, intent was to stop 06/14/20) xxxx Vanc 4/21>> Cefepime 4/21 >> Azithro 4/23  >>4/28   Peripheral eos hx  Results for NASIRE, REALI (MRN [id number]) as of 06/03/2020 08:29  Ref. Range 01/17/2020 12:20 01/17/2020 14:56 01/17/2020 15:37 01/18/2020 03:28 01/19/2020 03:30 02/07/2020 08:46 02/27/2020 09:50 02/27/2020 13:39 02/28/2020 03:39 02/29/2020 03:33 05/01/2020 13:51 05/01/2020 17:29 05/02/2020 08:55 05/03/2020 08:37 05/04/2020 09:15 05/31/2020 17:42 06/01/2020 03:21 06/02/2020 06:07 06/03/2020 03:21  Eosinophils Absolute Latest Ref Range: 0.0 - 0.5 K/uL                0.3  0.0     Pertinent  Medical History     has a past medical history of Benign localized prostatic hyperplasia with lower urinary tract symptoms (LUTS), Complication of anesthesia, Diverticulosis of colon, History of colon polyps, History of kidney stones, History of MRSA infection (2007), History of urinary retention (05/31/2017), Incomplete emptying of bladder, OA (osteoarthritis), Peripheral neuropathy, Pinched nerve in neck, Wears glasses, and Wears hearing aid in both ears.   Significant Hospital Events: Including procedures, antibiotic start and stop dates in addition to other pertinent events   . 05/31/2020 - admit.  91% oxygen on room air . 06/01/2020: 91% oxygen on room air.  Dr. Alvan Dame orthopedic consult: Recurrent left hip seroma.  Antibiotic plan per infectious diseases.  If no IV antibiotic  planned and recommended doxycycline for suppression for 6 months at least and consider IR drain to the left hip o Left upper extremity PICC line removed . 4/22 ID consut - recopmmend dc pic line due to cellulitis concern and marked upper extremity edema and follow-up blood cultures.  Consider you cephalic  pneumonia. . 4/23 - speech eval -regular diet, think liquids . 4/25 off bipap 10pm . 4/26 on high flow nasal cannula plus nonrebreather . 4/28 decrease oxygen requirements to high flow nasal cannula by afternoon    Interim History / Subjective:    Continues to improve. Oxygen requirements down to 6 L nasal cannula. Able to stand by himself and pivot, in fact tolerated being off oxygen for 1 to 2 minutes with desaturation down to 88%  Objective   Blood pressure 136/69, pulse 96, temperature 97.7 F (36.5 C), temperature source Oral, resp. rate (!) 23, height 6\' 7"  (2.007 m), weight (!) 149.4 kg, SpO2 90 %.        Intake/Output Summary (Last 24 hours) at 06/09/2020 1114 Last data filed at 06/09/2020 0636 Gross per 24 hour  Intake --  Output 700 ml  Net -700 ml   Filed Weights   05/31/20 1741 06/02/20 2100  Weight: 136.1 kg (!) 149.4 kg    Examination: General: Obese male on HFNC 8 to 10 L, no distress HENT: No pallor, no JVD Lungs: No accessory muscle use, bibasal crackles, no rhonchi Cardiovascular: Regular rate and rhythm Abdomen: Obese and soft nontender Extremities: No cyanosis no clubbing , 1+ weeping edema left forearm and hand , now wrapped Neuro: Alert, interactive, nonfocal   Labs/imaging that I havepersonally reviewed  (right click and "Reselect all SmartList Selections" daily)   Labs show normal electrolytes, stable renal function, decreasing leukocytosis CBGs well controlled  Chest x-ray 4/ 28 show mild improvement in bilateral airspace disease, small bilateral effusions  CTA chest 4/21 small bilateral effusions, bilateral patchy groundglass opacity?  Multilobar pneumonia.  CT chest without contrast 4/24 persistent bilateral multifocal and groundglass opacities, no effusions  Echo 4/24 >> nml LVEF , gr 1 DD, asc aorta 76mm  Urine Legionella negative Urine strep antigen negative  Resolved Hospital Problem list     Assessment & Plan:   Acute  Hypoxemic Respiratory Failure - severe, needing BiPAP and high fio2.  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 -Continue high flow nasal cannula and attempt to decrease FiO2 -Await QuantiFERON gold for completion -Aggressive antitussives with Delsym, Tessalon and Hycodan cough syrup   Steroid-induced hyperglycemia  -Drop to 5 units Lantus  Left upper extremity thrombophlebitis  - keep limb elevated, duplex neg 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 -was on ceftriaxone and daptomycin, completed 7 days of vancomycin and  cefepime for HCAP , now on cefdinir and doxycycline per ID   Full code Can transfer to telemetry Still needs aggressive PT and rehab     LABS    PULMONARY Recent Labs  Lab 06/02/20 2020 06/03/20 0540  PHART 7.383 7.367  PCO2ART 34.8 40.1  PO2ART 75.8* 70.1*  HCO3 20.3 22.5  O2SAT 94.2 92.7    CBC Recent Labs  Lab 06/06/20 0930 06/07/20 0301 06/08/20 0258  HGB 8.9* 8.4* 8.7*  HCT 28.9* 28.3* 30.0*  WBC 15.0* 14.5* 13.9*  PLT 213 250 251    COAGULATION No results for input(s): INR in the last 168 hours.  CARDIAC  No results for input(s): TROPONINI in the last 168 hours. No results for input(s): PROBNP in the last 168 hours.   CHEMISTRY Recent Labs   Lab 06/04/20 0342 06/05/20 0311 06/06/20 0711 06/07/20 0301 06/08/20 0258  NA 140 138 136 138 138  K 4.1 4.1 4.5 4.4 4.9  CL 109 109 108 110 109  CO2 22 22 18* 20* 21*  GLUCOSE 147* 156* 127* 124* 139*  BUN 50* 55* 60* 59* 52*  CREATININE 1.17 1.11 1.07 1.09 1.00  CALCIUM 8.3* 8.3* 7.9* 8.0* 7.8*   Estimated Creatinine Clearance: 103.1 mL/min (by C-G formula based on SCr of 1 mg/dL).   LIVER No results for input(s): AST, ALT, ALKPHOS, BILITOT, PROT, ALBUMIN, INR in the last 168 hours.   INFECTIOUS Recent Labs  Lab 06/03/20 1122 06/04/20 0342 06/05/20 0311  PROCALCITON 2.55 1.65 0.94     ENDOCRINE CBG (last 3)  Recent Labs    06/09/20 0000 06/09/20 0347 06/09/20 0841  GLUCAP 132* 133* 134*      ATTESTATION & SIGNATURE      Kara Mead MD. FCCP. [facility] Pager : [phone]  If no response to pager , please call [phone] until 7 pm After 7:00 pm call Elink  [phone]     06/09/2020

## 2020-06-09 NOTE — Progress Notes (Signed)
PROGRESS NOTE    Jacob Mitchell    Code Status: Full Code  ZOX:[id number] DOB: 03-25-1945 DOA: 05/31/2020 LOS: 9 days  PCP: [name], NP CC: No chief complaint on file.      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  ED Course: Temperature is 98.5, blood pressure 141/115, pulse 100 respiratory 24 oxygen sat 91% on room air.  White count is 13.1 hemoglobin 9.1 platelet count of 387.  Chemistry showed BUN 27 creatinine 1.46 calcium 8.3.  Glucose 110 and lactic acid 2.0.  Urinalysis essentially negative.  Respiratory panel is currently pending.  LUE [place] negative for DVT. CTA chest showed no PE.  There is mild cardiomegaly and small bilateral pleural effusions.  There is bilateral patchy groundglass opacity consistent with multilobar pneumonia.  ID and orthopedic surgery were consulted.   06/09/2020: Patient was transferred to ICU a few days ago due to worsening respiratory failure requiring BiPAP.  Patient has been on significant amount of supplemental oxygen, but not down to 6 L/min via nasal cannula.  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  Patient is on IV Solu-Medrol 80 Mg 3 times daily.  I believe patient will transfer back to the hospitalist team today.  Discussed with ICU team, Dr. Elsworth Soho.  ICU team input is appreciated.   A & P   Principal Problem:   Sepsis ([place]) Active Problems:   S/P left THA, AA   S/P revision of total hip   Obese   HCAP (healthcare-associated pneumonia)   Cellulitis   Acute respiratory failure with hypoxia (HCC)   PICC line infection, initial encounter   Pulmonary  eosinophilia (HCC)   Drug-induced lung disease   Prosthetic hip infection (HCC)   Low O2 saturation   1. 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 -Patient is on IV Solu-Medrol 80 Mg 3 times daily. -Supplemental oxygen requirement is coming down. -ICU team is directing care. -Studies done reviewed.  2. LUE PICC line associated infection with edema a. Left upper extremity swelling persists.   b. Negative LUE [place] for DVT c. Patient is currently on Omnicef and doxycycline.    3. 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 Has been on daptomycin/ceftriaxone via LUE PICC, 6 weeks total, planned to end 5/5,  however now with PICC line associate infection/edema b. PICC line removed by IR on 4/22 c. ID and Ortho on board, appreciate recommendations  4. Elevated creatinine: a. Cr baseline 1.0-1.3 b. Serum creatinine peaked at 1.46. c. Serum creatinine is down to 1 today.    5. Obesity a. Body mass index is 37.1 kg/m.  b. Lifestyle modification   DVT prophylaxis:    Family Communication: no family at bedside today  Disposition Plan: discharge pending clinical improvement and Ortho/ID sign off Status is: Inpatient  Remains inpatient appropriate because:IV treatments appropriate due to intensity of illness or inability to take PO and Inpatient level of care appropriate due to severity of illness   Dispo: The patient is from: Home  Anticipated d/c is to: Home              Patient currently is not medically stable to d/c.   Difficult to place patient No           Pressure injury documentation    None  Consultants  ID Ortho  Procedures  LUE PICC line removal   Antibiotics   Anti-infectives (From admission, onward)   Start     Dose/Rate Route Frequency Ordered Stop   06/07/20 1000  cefdinir (OMNICEF) capsule 300 mg        300 mg Oral  Every 12 hours 06/06/20 1002     06/07/20 1000  doxycycline (VIBRA-TABS) tablet 100 mg        100 mg Oral Every 12 hours 06/06/20 1002     06/03/20 1800  vancomycin (VANCOREADY) IVPB 1000 mg/200 mL        1,000 mg 200 mL/hr over 60 Minutes Intravenous Every 12 hours 06/03/20 0838 06/06/20 1945   06/03/20 1030  azithromycin (ZITHROMAX) 500 mg in sodium chloride 0.9 % 250 mL IVPB  Status:  Discontinued        500 mg 250 mL/hr over 60 Minutes Intravenous Every 24 hours 06/03/20 0931 06/06/20 0758   06/03/20 1000  azithromycin (ZITHROMAX) tablet 250 mg  Status:  Discontinued       "Followed by" Linked Group Details   250 mg Oral Daily 06/02/20 0942 06/03/20 0931   06/02/20 1030  azithromycin (ZITHROMAX) tablet 500 mg       "Followed by" Linked Group Details   500 mg Oral Daily 06/02/20 0942 06/02/20 1042   06/02/20 0000  vancomycin (VANCOREADY) IVPB 1500 mg/300 mL  Status:  Discontinued        1,500 mg 150 mL/hr over 120 Minutes Intravenous Every 24 hours 06/01/20 0129 06/03/20 0838   06/01/20 0800  ceFEPIme (MAXIPIME) 2 g in sodium chloride 0.9 % 100 mL IVPB        2 g 200 mL/hr over 30 Minutes Intravenous Every 8 hours 06/01/20 0129 06/06/20 1837   06/01/20 0400  vancomycin (VANCOREADY) IVPB 1000 mg/200 mL  Status:  Discontinued        1,000 mg 200 mL/hr over 60 Minutes Intravenous  Once 06/01/20 0300 06/01/20 0307   06/01/20 0400  ceFEPIme (MAXIPIME) 2 g in sodium chloride 0.9 % 100 mL IVPB  Status:  Discontinued        2 g 200 mL/hr over 30 Minutes Intravenous  Once 06/01/20 0300 06/01/20 0307   05/31/20 2245  vancomycin (VANCOCIN) IVPB 1000 mg/200 mL premix  Status:  Discontinued        1,000 mg 200 mL/hr over 60 Minutes Intravenous  Once 05/31/20 2232 05/31/20 2235   05/31/20 2245  ceFEPIme (MAXIPIME) 2 g in sodium chloride 0.9 % 100 mL IVPB        2 g 200 mL/hr over 30 Minutes Intravenous  Once 05/31/20 2232 06/01/20 0132   05/31/20 2245  vancomycin (VANCOREADY) IVPB 2000 mg/400  mL        2,000 mg 200 mL/hr over 120 Minutes Intravenous  Once 05/31/20 2235 06/01/20 0233        Subjective   -Patient continues to report improvement. -No worsening of shortness of breath. -No chest pain. -No fever or chills.   -Supplemental oxygen requirement is trending downwards.    Objective   Vitals:   06/09/20 0500 06/09/20 0700 06/09/20 0800 06/09/20 0910  BP: 136/69  Pulse: 60 71 96   Resp: 20 (!) 23 (!) 23   Temp:    97.7 F (36.5 C)  TempSrc:    Oral  SpO2: 95% (!) 85% 90%   Weight:      Height:        Intake/Output Summary (Last 24 hours) at 06/09/2020 0953 Last data filed at 06/09/2020 0636 Gross per 24 hour  Intake --  Output 700 ml  Net -700 ml   Filed Weights   05/31/20 1741 06/02/20 2100  Weight: 136.1 kg (!) 149.4 kg    Examination: General condition: Patient is morbidly obese.  Patient is turning any distress.  Patient is awake and alert. HEENT: Mild pallor.  No jaundice.  Neck: Supple.  JVD difficult to assess. Lungs: Clear to auscultation. CVS: S1-S2. Abdomen: Obese, soft and nontender.  Genital deferred assess. Neuro: Awake and alert.  Patient moves all extremities. Extremities: Left upper extremity swelling.  Mild bilateral ankle edema.  Data Reviewed: I have personally reviewed following labs and imaging studies  CBC: Recent Labs  Lab 06/03/20 1122 06/04/20 0342 06/05/20 0311 06/06/20 0930 06/07/20 0301 06/08/20 0258  WBC 18.3* 12.9* 18.7* 15.0* 14.5* 13.9*  NEUTROABS 16.4*  --   --   --   --   --   HGB 8.7* 8.6*  8.7* 8.8* 8.9* 8.4* 8.7*  HCT 29.4* 28.4* 29.5* 28.9* 28.3* 30.0*  MCV 82.6 81.8 83.1 79.2* 82.5 82.0  PLT 322 294 339 213 250 [id number]   Basic Metabolic Panel: Recent Labs  Lab 06/04/20 0342 06/05/20 0311 06/06/20 0711 06/07/20 0301 06/08/20 0258  NA 140 138 136 138 138  K 4.1 4.1 4.5 4.4 4.9  CL 109 109 108 110 109  CO2 22 22 18* 20* 21*  GLUCOSE 147* 156* 127* 124* 139*  BUN 50* 55* 60* 59* 52*   CREATININE 1.17 1.11 1.07 1.09 1.00  CALCIUM 8.3* 8.3* 7.9* 8.0* 7.8*   GFR: Estimated Creatinine Clearance: 103.1 mL/min (by C-G formula based on SCr of 1 mg/dL). Liver Function Tests: No results for input(s): AST, ALT, ALKPHOS, BILITOT, PROT, ALBUMIN in the last 168 hours. No results for input(s): LIPASE, AMYLASE in the last 168 hours. No results for input(s): AMMONIA in the last 168 hours. Coagulation Profile: No results for input(s): INR, PROTIME in the last 168 hours. Cardiac Enzymes: No results for input(s): CKTOTAL, CKMB, CKMBINDEX, TROPONINI in the last 168 hours. BNP (last 3 results) No results for input(s): PROBNP in the last 8760 hours. HbA1C: No results for input(s): HGBA1C in the last 72 hours. CBG: Recent Labs  Lab 06/08/20 1605 06/08/20 2009 06/09/20 0000 06/09/20 0347 06/09/20 0841  GLUCAP 182* 139* 132* 133* 134*   Lipid Profile: No results for input(s): CHOL, HDL, LDLCALC, TRIG, CHOLHDL, LDLDIRECT in the last 72 hours. Thyroid Function Tests: No results for input(s): TSH, T4TOTAL, FREET4, T3FREE, THYROIDAB in the last 72 hours. Anemia Panel: No results for input(s): VITAMINB12, FOLATE, FERRITIN, TIBC, IRON, RETICCTPCT in the last 72 hours. Sepsis Labs: Recent Labs  Lab 06/03/20 1122 06/04/20 0342 06/05/20 0311  PROCALCITON 2.55 1.65 0.94    Recent Results (from the past 240 hour(s))  Culture, blood (routine x 2)     Status: None   Collection Time: 05/31/20  7:58 PM   Specimen: BLOOD RIGHT HAND  Result Value Ref Range Status   Specimen Description   Final    BLOOD RIGHT HAND Performed at [facility] [address] [place], [place]  [address]    Special Requests   Final    BOTTLES DRAWN AEROBIC AND ANAEROBIC Blood Culture adequate volume Performed at [facility] [address] [place], [place] [address]    Culture   Final    NO GROWTH 5 DAYS Performed at [facility], [place] [address]  [place], [place] [address]    Report Status 06/06/2020 FINAL  Final  Culture, blood (Routine X 2) w Reflex to ID Panel     Status: None   Collection Time: 05/31/20  8:16 PM   Specimen: BLOOD  Result Value Ref Range Status   Specimen Description   Final    BLOOD RIGHT ANTECUBITAL Performed at [facility] [address], [place], [place] [address]    Special Requests   Final    BOTTLES DRAWN AEROBIC AND ANAEROBIC Blood Culture results may not be optimal due to an excessive volume of blood received in culture bottles Performed at [facility] [address], [place], [place] [address]    Culture   Final    NO GROWTH 5 DAYS Performed at [facility], [place] [address], [place], [place] [address]    Report Status 06/06/2020 FINAL  Final  Culture, blood (routine x 2)     Status: None   Collection Time: 05/31/20  8:26 PM   Specimen: BLOOD RIGHT HAND  Result Value Ref Range Status   Specimen Description   Final    BLOOD RIGHT HAND Performed at [facility] [address], [place], [place] [address]    Special Requests   Final    BOTTLES DRAWN AEROBIC ONLY Blood Culture results may not be optimal due to an excessive volume of blood received in culture bottles Performed at [facility] [address], [place], [place] [address]    Culture   Final    NO GROWTH 5 DAYS Performed at [facility], [place] [address], [place], [place] [address]    Report Status 06/06/2020 FINAL  Final  Respiratory (~20 pathogens) panel by PCR     Status: None   Collection Time: 05/31/20 10:02 PM   Specimen: Nasopharyngeal Swab; Respiratory  Result Value Ref Range Status   Adenovirus NOT DETECTED NOT DETECTED Final   Coronavirus 229E NOT DETECTED NOT DETECTED Final    Comment: (NOTE) The Coronavirus on the Respiratory Panel, DOES NOT test for the novel  Coronavirus (2019 nCoV)    Coronavirus HKU1 NOT DETECTED NOT DETECTED  Final   Coronavirus NL63 NOT DETECTED NOT DETECTED Final   Coronavirus OC43 NOT DETECTED NOT DETECTED Final   Metapneumovirus NOT DETECTED NOT DETECTED Final   Rhinovirus / Enterovirus NOT DETECTED NOT DETECTED Final   Influenza A NOT DETECTED NOT DETECTED Final   Influenza B NOT DETECTED NOT DETECTED Final   Parainfluenza Virus 1 NOT DETECTED NOT DETECTED Final   Parainfluenza Virus 2 NOT DETECTED NOT DETECTED Final   Parainfluenza Virus 3 NOT DETECTED NOT DETECTED Final   Parainfluenza Virus 4 NOT DETECTED NOT DETECTED Final   Respiratory Syncytial Virus NOT DETECTED NOT DETECTED Final   Bordetella pertussis NOT DETECTED NOT DETECTED Final   Bordetella Parapertussis NOT DETECTED NOT DETECTED Final   Chlamydophila pneumoniae NOT DETECTED NOT DETECTED Final   Mycoplasma pneumoniae NOT DETECTED NOT DETECTED Final    Comment: Performed at [facility], [place]. [address] [place], [place] [address]  Expectorated Sputum Assessment w Gram Stain,  Rflx to Resp Cult     Status: None   Collection Time: 06/02/20  9:31 AM   Specimen: Sputum  Result Value Ref Range Status   Specimen Description SPUTUM  Final   Special Requests NONE  Final   Sputum evaluation   Final    THIS SPECIMEN IS ACCEPTABLE FOR SPUTUM CULTURE Performed at [facility], [place] [address] [place], [place] [address]    Report Status 06/04/2020 FINAL  Final  Culture, Respiratory w Gram Stain     Status: None   Collection Time: 06/02/20  9:31 AM   Specimen: SPU  Result Value Ref Range Status   Specimen Description   Final    SPUTUM Performed at [facility] [address] [place], [place] [address]    Special Requests   Final    NONE Reflexed from [phone] Performed at [facility], [place] [address], [place], [place] [address]    Gram Stain   Final    RARE WBC PRESENT,BOTH PMN AND MONONUCLEAR FEW YEAST FEW GRAM POSITIVE COCCI IN PAIRS RARE GRAM NEGATIVE RODS     Culture   Final    FEW CANDIDA ALBICANS WITHIN NORMAL RESPIRATORY FLORA Performed at [facility], [place] [address], [place], [place] [address]    Report Status 06/05/2020 FINAL  Final  MRSA PCR Screening     Status: None   Collection Time: 06/02/20  9:59 PM   Specimen: Nasal Mucosa; Nasopharyngeal  Result Value Ref Range Status   MRSA by PCR NEGATIVE NEGATIVE Final    Comment:        The GeneXpert MRSA Assay (FDA approved for NASAL specimens only), is one component of a comprehensive MRSA colonization surveillance program. It is not intended to diagnose MRSA infection nor to guide or monitor treatment for MRSA infections. Performed at [facility], [place] [address], [place], [place] [address]          Radiology Studies: DG CHEST PORT 1 VIEW  Result Date: 06/07/2020 CLINICAL DATA:  Acute respiratory failure with hypoxia EXAM: PORTABLE CHEST 1 VIEW COMPARISON:  06/02/2020 FINDINGS: Improvement in vascular congestion. Improvement in perihilar airspace disease bilaterally. Bibasilar atelectasis/infiltrate is unchanged. Possible small effusions. IMPRESSION: Improvement in vascular congestion and probable mild edema. Persistent bibasilar atelectasis/infiltrate and small effusions. Electronically Signed   By: Franchot Gallo M.D.   On: 06/07/2020 11:54        Scheduled Meds: . benzonatate  200 mg Oral TID  . cefdinir  300 mg Oral Q12H  . Chlorhexidine Gluconate Cloth  6 each Topical Daily  . dextromethorphan  30 mg Oral BID  . doxycycline  100 mg Oral Q12H  . enoxaparin (LOVENOX) injection  75 mg Subcutaneous Q24H  . insulin aspart  3-9 Units Subcutaneous TID WC  . insulin detemir  10 Units Subcutaneous Daily  . methylPREDNISolone (SOLU-MEDROL) injection  80 mg Intravenous Q8H  . traZODone  100 mg Oral QHS   Continuous Infusions: . sodium chloride 10 mL/hr at 06/07/20 1200     Time spent: 35 minutes with over 50% of the time coordinating the  patient's care    [place] Public, DO [facility]   Call night coverage person covering after 7pm

## 2020-06-10 ENCOUNTER — Telehealth: Payer: Self-pay | Admitting: Pulmonary Disease

## 2020-06-10 ENCOUNTER — Inpatient Hospital Stay (HOSPITAL_COMMUNITY): Payer: Medicare Other

## 2020-06-10 DIAGNOSIS — J189 Pneumonia, unspecified organism: Secondary | ICD-10-CM | POA: Diagnosis not present

## 2020-06-10 DIAGNOSIS — R69 Illness, unspecified: Secondary | ICD-10-CM

## 2020-06-10 DIAGNOSIS — L03114 Cellulitis of left upper limb: Secondary | ICD-10-CM | POA: Diagnosis not present

## 2020-06-10 DIAGNOSIS — T50905A Adverse effect of unspecified drugs, medicaments and biological substances, initial encounter: Secondary | ICD-10-CM | POA: Diagnosis not present

## 2020-06-10 DIAGNOSIS — J8289 Other pulmonary eosinophilia, not elsewhere classified: Secondary | ICD-10-CM

## 2020-06-10 DIAGNOSIS — J984 Other disorders of lung: Secondary | ICD-10-CM | POA: Diagnosis not present

## 2020-06-10 DIAGNOSIS — J9601 Acute respiratory failure with hypoxia: Secondary | ICD-10-CM | POA: Diagnosis not present

## 2020-06-10 DIAGNOSIS — A419 Sepsis, unspecified organism: Secondary | ICD-10-CM | POA: Diagnosis not present

## 2020-06-10 LAB — GLUCOSE, CAPILLARY
Glucose-Capillary: 103 mg/dL — ABNORMAL HIGH (ref 70–99)
Glucose-Capillary: 111 mg/dL — ABNORMAL HIGH (ref 70–99)
Glucose-Capillary: 101 mg/dL — ABNORMAL HIGH (ref 70–99)
Glucose-Capillary: 152 mg/dL — ABNORMAL HIGH (ref 70–99)
Glucose-Capillary: 157 mg/dL — ABNORMAL HIGH (ref 70–99)
Glucose-Capillary: 95 mg/dL (ref 70–99)

## 2020-06-10 MED ORDER — METHYLPREDNISOLONE SODIUM SUCC 125 MG IJ SOLR
80.0000 mg | Freq: Every day | INTRAMUSCULAR | Status: DC
  Administered 2020-06-11: 80 mg via INTRAVENOUS
  Filled 2020-06-10: qty 2

## 2020-06-10 NOTE — Progress Notes (Signed)
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  Has a history of left hip prosthesis that has been complicated by multiple revisions due to subluxation and recurrent left hip seroma.  He had a fall in November 2021 with a left trochanteric femur fracture and status post ORIF in December 2021.  In early January 2022 , pre-op COVID-19 test was POS.  Subsequently had a second ORIF in mid January 2022 due to periprosthetic fracture.  Recommend incision and drainage on May 01, 2020.  At this point in time placed on daptomycin and ceftriaxone for 6 weeks upper extremity PICC line.  Admitted on 05/31/2020 [1 month into daptomycin and ceftriaxone treatment] with clinically left PICC line related suspected cellulitis but also cough and shortness of breath [he also had cough even before daptomycin] but the cough is worsened over 2 weeks prior to admission.  Left upper extremity duplex negative for DVT at admission.  CT angiogram chest negative for pulmonary embolus but showed mild cardiomegaly and bilateral small pleural effusions with bilateral patchy groundglass opacities especially in the lower lobe.  Respiratory virus panel negative.  Seen by infectious disease consultation and orthopedics 06/01/2020 and antibiotics changed.  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  Course since admission  -Appears that entry into the ER was 91% on room air.  Somewhere along the way he was on 2 L nasal cannula.  But on the evening of 06/02/2020 he started requiring more oxygen and transferred to stepdown.  On the morning of 06/03/2020 was getting somnolent and requiring 15 L nasal cannula with easy desaturations and significant tachypnea.   Critical care medicine ordered BiPAP and he had rapid improvement in mental status.  Pulse ox 99% on 60% FiO2 on BiPAP   Antibiotic history   Daptomycin 3/24 >> (stopped4/20/22, intent was to stop 06/14/20) Ceftriaxone 3/24 >> (Stopped 05/30/20, intent was to stop 06/14/20) xxxx Vanc 4/21>> Cefepime 4/21 >> Azithro 4/23  >>4/28   Peripheral eos hx  Results for AMAURI, KEEFE (MRN [id number]) as of 06/03/2020 08:29  Ref. Range 01/17/2020 12:20 01/17/2020 14:56 01/17/2020 15:37 01/18/2020 03:28 01/19/2020 03:30 02/07/2020 08:46 02/27/2020 09:50 02/27/2020 13:39 02/28/2020 03:39 02/29/2020 03:33 05/01/2020 13:51 05/01/2020 17:29 05/02/2020 08:55 05/03/2020 08:37 05/04/2020 09:15 05/31/2020 17:42 06/01/2020 03:21 06/02/2020 06:07 06/03/2020 03:21  Eosinophils Absolute Latest Ref Range: 0.0 - 0.5 K/uL                0.3  0.0     Pertinent  Medical History     has a past medical history of Benign localized prostatic hyperplasia with lower urinary tract symptoms (LUTS), Complication of anesthesia, Diverticulosis of colon, History of colon polyps, History of kidney stones, History of MRSA infection (2007), History of urinary retention (05/31/2017), Incomplete emptying of bladder, OA (osteoarthritis), Peripheral neuropathy, Pinched nerve in neck, Wears glasses, and Wears hearing aid in both ears.   Significant Hospital Events: Including procedures, antibiotic start and stop dates in addition to other pertinent events   . 05/31/2020 - admit.  91% oxygen on room air . 06/01/2020: 91% oxygen on room air.  Dr. Alvan Dame orthopedic consult: Recurrent left hip seroma.  Antibiotic plan per infectious diseases.  If no IV antibiotic  planned and recommended doxycycline for suppression for 6 months at least and consider IR drain to the left hip o Left upper extremity PICC line removed . 4/22 ID consut - recopmmend dc pic line due to cellulitis concern and marked upper extremity edema and follow-up blood cultures.  Consider you cephalic  pneumonia. . 4/23 - speech eval -regular diet, think liquids . 4/25 off bipap 10pm . 4/26 on high flow nasal cannula plus nonrebreather . 4/28 decreased oxygen requirements to high flow nasal cannula     Interim History / Subjective:   Continues to improve No dyspnea or chest pain Down to 4 L nasal cannula  Objective   Blood pressure (!) 107/56, pulse 61, temperature 98.5 F (36.9 C), temperature source Oral, resp. rate 17, height 6\' 7"  (2.007 m), weight (!) 149.4 kg, SpO2 96 %.        Intake/Output Summary (Last 24 hours) at 06/10/2020 1430 Last data filed at 06/10/2020 1400 Gross per 24 hour  Intake --  Output 1825 ml  Net -1825 ml   Filed Weights   05/31/20 1741 06/02/20 2100  Weight: 136.1 kg (!) 149.4 kg    Examination: General: Obese male on 4 L nasal cannula HENT: No pallor, no JVD Lungs: Bibasilar crackles, no accessory muscle use, no rhonchi Cardiovascular: Regular rate and rhythm Abdomen: Obese and soft nontender Extremities: No cyanosis no clubbing , 1+ weeping edema left forearm Neuro: Alert, interactive, can speak in full sentences   Labs/imaging that I havepersonally reviewed  (right click and "Reselect all SmartList Selections" daily)   Labs show normal electrolytes, stable renal function, decreasing leukocytosis CBGs well controlled  Chest x-ray 5/1 shows slight increase in airspace disease especially right lower lobe which may be related to lung volumes  CTA chest 4/21 small bilateral effusions, bilateral patchy groundglass opacity?  Multilobar pneumonia.  CT chest without contrast 4/24 persistent bilateral multifocal and groundglass opacities, no effusions  Echo 4/24 >> nml LVEF , gr 1 DD, asc aorta 34mm  Urine Legionella negative Urine strep antigen negative  Resolved Hospital Problem list     Assessment & Plan:   Acute Hypoxemic Respiratory Failure - severe, needing BiPAP and high fio2.  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 -> older, male and been on daptomyicin > 10 days, slow but definite response to steroids started 4/24  - RVP - negative   -Doubt COVID lung,  he was asymptomatic when he had positive preop testing, COVID IgG positive  be due to prior infection, clarified that he is vaccinated and boosted  Plan  -Decrease Solu-Medrol to 80 daily, starting tomorrow we can change him to 60 mg of prednisone with the plan to taper by 10 mg every 5 days -He will need a follow-up in our office in 2 weeks when we can repeat a chest x-ray -Hopeful that at this rate he will come off oxygen in a few days -Aggressive antitussives with Delsym, Tessalon and Hycodan cough syrup   Steroid-induced hyperglycemia  -With drop in the dose of steroids, he may not need anymore Lantus  Severe deconditioning-he is interested in SNF rehab  Left upper extremity thrombophlebitis  - keep limb elevated, duplex neg 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 -was on ceftriaxone and daptomycin, completed 7  days of vancomycin and cefepime for HCAP , now on cefdinir and doxycycline per ID   PCCM will be available as needed, will arrange for outpatient follow-up    LABS    PULMONARY No results for input(s): PHART, PCO2ART, PO2ART, HCO3, TCO2, O2SAT in the last 168 hours.  Invalid input(s): PCO2, PO2  CBC Recent Labs  Lab 06/06/20 0930 06/07/20 0301 06/08/20 0258  HGB 8.9* 8.4* 8.7*  HCT 28.9* 28.3* 30.0*  WBC 15.0* 14.5* 13.9*  PLT 213 250 251    COAGULATION No results for input(s): INR in the last 168 hours.  CARDIAC  No results for input(s): TROPONINI in the last 168 hours. No results for input(s): PROBNP in the last 168 hours.   CHEMISTRY Recent Labs  Lab 06/04/20 0342 06/05/20 0311 06/06/20 0711  06/07/20 0301 06/08/20 0258  NA 140 138 136 138 138  K 4.1 4.1 4.5 4.4 4.9  CL 109 109 108 110 109  CO2 22 22 18* 20* 21*  GLUCOSE 147* 156* 127* 124* 139*  BUN 50* 55* 60* 59* 52*  CREATININE 1.17 1.11 1.07 1.09 1.00  CALCIUM 8.3* 8.3* 7.9* 8.0* 7.8*   Estimated Creatinine Clearance: 103.1 mL/min (by C-G formula based on SCr of 1 mg/dL).   LIVER No results for input(s): AST, ALT, ALKPHOS, BILITOT, PROT, ALBUMIN, INR in the last 168 hours.   INFECTIOUS Recent Labs  Lab 06/04/20 0342 06/05/20 0311  PROCALCITON 1.65 0.94     ENDOCRINE CBG (last 3)  Recent Labs    06/10/20 0351 06/10/20 0741 06/10/20 [place] Diva Lemberger MD. FCCP. [facility] Pager : [phone]  If no response to pager , please call [phone] until 7 pm After 7:00 pm call Elink  [phone]     06/10/2020

## 2020-06-10 NOTE — Progress Notes (Signed)
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  Has a history of left hip prosthesis that has been complicated by multiple revisions due to subluxation and recurrent left hip seroma.  He had a fall in November 2021 with a left trochanteric femur fracture and status post ORIF in December 2021.  In early January 2022 , positive for  COVID-19 .  ORIF in mid January 2022 due to periprosthetic fracture.  S/P incision and drainage on May 01, 2020. Admitted  05/31/20 with progressive SOB,Left upper extremity duplex negative for DVT,  CT angiogram chest negative for pulmonary embolus but showed mild cardiomegaly and bilateral small pleural effusions with bilateral patchy groundglass opacities especially in the lower lobe    PT Comments    Pt progressing although continues with very limited activity tolerance. SpO2=87-97% on 4L, 3/4 DOE with minimal activity/standing.  Pt may benefit from SNF to maximize endurance, safety, overall independence prior return home. Continue PT in acute setting.    Follow Up Recommendations  SNF     Equipment Recommendations  None recommended by PT    Recommendations for Other Services       Precautions / Restrictions Precautions Precautions: Fall Precaution Comments: left posterior hip precautions,  (does not typically adhere to THP). monitor O2 Restrictions LLE Weight Bearing: Weight bearing as tolerated    Mobility  Bed Mobility               General bed mobility comments: in recliner on arrival    Transfers Overall transfer level: Needs assistance Equipment used: Rolling walker (2 wheeled) Transfers: Sit to/from Stand Sit to Stand: Min assist         General transfer comment: assist to power up to stand. chair ht built up with blankets d/t pt's ht and THP  Ambulation/Gait              General Gait Details: NT d/t fatigue   Stairs             Wheelchair Mobility    Modified Rankin (Stroke Patients Only)       Balance     Sitting balance-Leahy Scale: Fair       Standing balance-Leahy Scale: Poor Standing balance comment: reliant on UEs; standing tolerance improved. able to stand ~ 3 minutes however began to lean on RW d/t fatigue after that point                            Cognition Arousal/Alertness: Awake/alert Behavior During Therapy: [facility] for tasks assessed/performed                     Orientation Level: Disoriented to;Time   Memory: Decreased recall of precautions;Decreased short-term memory         General Comments: pt unsure of time in hospital " a few days" (has been here 9 days). pt did concur that he is not ready to go home d/t weakness      Exercises General Exercises - Lower Extremity Long Arc Quad: AROM;10 reps;Seated;Both Heel Raises: AROM;Both;10 reps;Seated Other Exercises Other Exercises: reviewed importance of trunk/thoracic extension, shoulder depression, scapular retraction. Other Exercises: instructed in diaphragmatic breathing, importance of exhalation    General Comments        Pertinent Vitals/Pain Pain Assessment: No/denies pain    Home Living  Prior Function            PT Goals (current goals can now be found in the care plan section) Acute Rehab PT Goals Patient Stated Goal: to go home soon PT Goal Formulation: With patient/family Time For Goal Achievement: 06/21/20 Potential to Achieve Goals: Good Progress towards PT goals: Progressing toward goals (slowly d/t decr endurance)    Frequency    Min 3X/week      PT Plan Current plan remains appropriate    Co-evaluation              AM-PAC PT "6 Clicks" Mobility   Outcome Measure  Help needed turning from your back to your side while in a flat bed without using bedrails?: A  Lot Help needed moving from lying on your back to sitting on the side of a flat bed without using bedrails?: A Lot Help needed moving to and from a bed to a chair (including a wheelchair)?: A Little Help needed standing up from a chair using your arms (e.g., wheelchair or bedside chair)?: A Little Help needed to walk in hospital room?: A Lot Help needed climbing 3-5 steps with a railing? : Total 6 Click Score: 13    End of Session Equipment Utilized During Treatment: Gait belt Activity Tolerance: Patient limited by fatigue;Patient tolerated treatment well Patient left: in chair;with call bell/phone within reach;with family/visitor present Nurse Communication: Mobility status PT Visit Diagnosis: Unsteadiness on feet (R26.81);Muscle weakness (generalized) (M62.81);Difficulty in walking, not elsewhere classified (R26.2)     Time: 7517-0017 PT Time Calculation (min) (ACUTE ONLY): 32 min  Charges:  $Therapeutic Exercise: 8-22 mins $Therapeutic Activity: 8-22 mins                     Baxter Flattery, PT  Acute Rehab Dept ([place]) [phone] Pager [phone]  06/10/2020    [facility] 06/10/2020, 1:28 PM

## 2020-06-10 NOTE — Telephone Encounter (Signed)
Please arrange for outpatient follow-up with me/APP in 2 weeks with chest x-ray

## 2020-06-10 NOTE — Progress Notes (Signed)
Per Tele pt had 6 beats of [employer], upon checking up on him, He is fine. On call provider notified, A Zierle-Ghosh.

## 2020-06-10 NOTE — Progress Notes (Signed)
OT Cancellation Note  Patient Details Name: Jacob Mitchell MRN: [id number] DOB: September 10, 1945   Cancelled Treatment:    Reason Eval/Treat Not Completed: Other (comment) (recently back to bed after sitting up for a few hours in recliner). Plan to reattempt at later date.  Tyrone Schimke, OT Acute Rehabilitation Services Pager: [phone] Office: [phone]  06/10/2020, 3:37 PM

## 2020-06-10 NOTE — Progress Notes (Signed)
PROGRESS NOTE    Jacob Mitchell    Code Status: Full Code  HCW:[id number] DOB: Nov 28, 1945 DOA: 05/31/2020 LOS: 10 days  PCP: [name], NP CC: No chief complaint on file.      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  ED Course: Temperature is 98.5, blood pressure 141/115, pulse 100 respiratory 24 oxygen sat 91% on room air.  White count is 13.1 hemoglobin 9.1 platelet count of 387.  Chemistry showed BUN 27 creatinine 1.46 calcium 8.3.  Glucose 110 and lactic acid 2.0.  Urinalysis essentially negative.  Respiratory panel is currently pending.  LUE [place] negative for DVT. CTA chest showed no PE.  There is mild cardiomegaly and small bilateral pleural effusions.  There is bilateral patchy groundglass opacity consistent with multilobar pneumonia.  ID and orthopedic surgery were consulted.   06/09/2020: Patient was transferred to ICU a few days ago due to worsening respiratory failure requiring BiPAP.  Patient has been on significant amount of supplemental oxygen, but not down to 6 L/min via nasal cannula.  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  Patient is on IV Solu-Medrol 80 Mg 3 times daily.  I believe patient will transfer back to the hospitalist team today.  Discussed with ICU team, Dr. Elsworth Soho.  ICU team input is appreciated.  06/10/2020: Patient seen alongside patient's wife.  Patient looks a lot better today.  Patient is on 4 L of supplemental oxygen.  Pulmonary team is decreased Solu-Medrol to IV 80 Mg once daily.  As per the pulmonary team's note, patient will start Solu-Medrol 60 Mg once daily  tomorrow and decrease by 10 Mg once daily every 5 days.  Patient will follow with pulmonary team in 2 weeks.  Plan is for likely rehab on discharge.  A & P   Principal Problem:   Sepsis ([place]) Active Problems:   S/P left THA, AA   S/P revision of total hip   Obese   HCAP (healthcare-associated pneumonia)   Cellulitis   Acute respiratory failure with hypoxia (HCC)   PICC line infection, initial encounter   Pulmonary eosinophilia (HCC)   Drug-induced lung disease   Prosthetic hip infection (HCC)   Low O2 saturation   1. 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 -Patient is on IV Solu-Medrol 80 Mg 3 times daily. -Supplemental oxygen requirement is coming down. -ICU team is directing care. -Studies done reviewed. 06/10/2020: Patient has continued to improve.  Oxygen supplementation is down to 4 L/min via nasal cannula.  IV Solu-Medrol has been decreased to 80 Mg once daily.  Please see documentation above regarding tapering of Solu-Medrol.  Pulmonary input is highly appreciated.  2. LUE PICC line associated infection with edema a. Left upper extremity swelling persists.   b. Negative LUE [place] for DVT c. Patient is currently on Omnicef and doxycycline.    3. 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 Has been on daptomycin/ceftriaxone via LUE PICC, 6 weeks total, planned to end 5/5,  however now with PICC line associate infection/edema b. PICC  line removed by IR on 4/22 c. ID and Ortho on board, appreciate recommendations  4. Elevated creatinine: a. Cr baseline 1.0-1.3 b. Serum creatinine peaked at 1.46. c. Serum creatinine is down to 1 today.    5. Obesity a. Body mass index is 37.1 kg/m.  b. Lifestyle modification   DVT prophylaxis:    Family Communication: no family at bedside today  Disposition Plan: discharge pending clinical improvement and Ortho/ID  sign off Status is: Inpatient  Remains inpatient appropriate because:IV treatments appropriate due to intensity of illness or inability to take PO and Inpatient level of care appropriate due to severity of illness   Dispo: The patient is from: Home              Anticipated d/c is to: Home              Patient currently is not medically stable to d/c.   Difficult to place patient No           Pressure injury documentation    None  Consultants  ID Ortho  Procedures  LUE PICC line removal   Antibiotics   Anti-infectives (From admission, onward)   Start     Dose/Rate Route Frequency Ordered Stop   06/07/20 1000  cefdinir (OMNICEF) capsule 300 mg        300 mg Oral Every 12 hours 06/06/20 1002     06/07/20 1000  doxycycline (VIBRA-TABS) tablet 100 mg        100 mg Oral Every 12 hours 06/06/20 1002     06/03/20 1800  vancomycin (VANCOREADY) IVPB 1000 mg/200 mL        1,000 mg 200 mL/hr over 60 Minutes Intravenous Every 12 hours 06/03/20 0838 06/06/20 1945   06/03/20 1030  azithromycin (ZITHROMAX) 500 mg in sodium chloride 0.9 % 250 mL IVPB  Status:  Discontinued        500 mg 250 mL/hr over 60 Minutes Intravenous Every 24 hours 06/03/20 0931 06/06/20 0758   06/03/20 1000  azithromycin (ZITHROMAX) tablet 250 mg  Status:  Discontinued       "Followed by" Linked Group Details   250 mg Oral Daily 06/02/20 0942 06/03/20 0931   06/02/20 1030  azithromycin (ZITHROMAX) tablet 500 mg       "Followed by" Linked Group Details   500 mg Oral Daily 06/02/20 0942 06/02/20 1042   06/02/20 0000  vancomycin (VANCOREADY) IVPB 1500 mg/300 mL  Status:  Discontinued        1,500 mg 150 mL/hr over 120 Minutes Intravenous Every 24 hours 06/01/20 0129 06/03/20 0838   06/01/20 0800  ceFEPIme (MAXIPIME) 2 g in sodium chloride 0.9 % 100 mL IVPB        2 g 200 mL/hr over 30 Minutes Intravenous Every 8 hours 06/01/20 0129 06/06/20 1837   06/01/20 0400  vancomycin (VANCOREADY) IVPB 1000 mg/200 mL   Status:  Discontinued        1,000 mg 200 mL/hr over 60 Minutes Intravenous  Once 06/01/20 0300 06/01/20 0307   06/01/20 0400  ceFEPIme (MAXIPIME) 2 g in sodium chloride 0.9 % 100 mL IVPB  Status:  Discontinued        2 g 200 mL/hr over 30 Minutes Intravenous  Once 06/01/20 0300 06/01/20 0307   05/31/20 2245  vancomycin (VANCOCIN) IVPB 1000 mg/200 mL premix  Status:  Discontinued        1,000 mg 200 mL/hr over 60 Minutes Intravenous  Once 05/31/20 2232 05/31/20  2235   05/31/20 2245  ceFEPIme (MAXIPIME) 2 g in sodium chloride 0.9 % 100 mL IVPB        2 g 200 mL/hr over 30 Minutes Intravenous  Once 05/31/20 2232 06/01/20 0132   05/31/20 2245  vancomycin (VANCOREADY) IVPB 2000 mg/400 mL        2,000 mg 200 mL/hr over 120 Minutes Intravenous  Once 05/31/20 2235 06/01/20 0233        Subjective   -Patient continues to improve. -Shortness of breath is improving.   -No chest pain. -No fever or chills.   -Supplemental oxygen requirement is trending downwards (currently on 4 L/min via nasal cannula).    Objective   Vitals:   06/09/20 2228 06/10/20 0232 06/10/20 0621 06/10/20 1336  BP: 119/71 (!) 154/81 140/77 (!) 107/56  Pulse: (!) 54 (!) 53 (!) 52 61  Resp: 17 17 15 17   Temp: 97.7 F (36.5 C) 97.9 F (36.6 C) 97.8 F (36.6 C) 98.5 F (36.9 C)  TempSrc: Oral  Oral Oral  SpO2: 93% 96% 91% 96%  Weight:      Height:        Intake/Output Summary (Last 24 hours) at 06/10/2020 1753 Last data filed at 06/10/2020 1600 Gross per 24 hour  Intake --  Output 2075 ml  Net -2075 ml   Filed Weights   05/31/20 1741 06/02/20 2100  Weight: 136.1 kg (!) 149.4 kg    Examination: General condition: Patient is morbidly obese.  Patient is turning any distress.  Patient is awake and alert. HEENT: Mild pallor.  No jaundice.  Neck: Supple.  JVD difficult to assess. Lungs: Clear to auscultation. CVS: S1-S2. Abdomen: Obese, soft and nontender.  Genital deferred assess. Neuro: Awake and alert.   Patient moves all extremities. Extremities: Left upper extremity swelling.  Mild bilateral ankle edema.  Data Reviewed: I have personally reviewed following labs and imaging studies  CBC: Recent Labs  Lab 06/04/20 0342 06/05/20 0311 06/06/20 0930 06/07/20 0301 06/08/20 0258  WBC 12.9* 18.7* 15.0* 14.5* 13.9*  HGB 8.6*  8.7* 8.8* 8.9* 8.4* 8.7*  HCT 28.4* 29.5* 28.9* 28.3* 30.0*  MCV 81.8 83.1 79.2* 82.5 82.0  PLT 294 339 213 250 703   Basic Metabolic Panel: Recent Labs  Lab 06/04/20 0342 06/05/20 0311 06/06/20 0711 06/07/20 0301 06/08/20 0258  NA 140 138 136 138 138  K 4.1 4.1 4.5 4.4 4.9  CL 109 109 108 110 109  CO2 22 22 18* 20* 21*  GLUCOSE 147* 156* 127* 124* 139*  BUN 50* 55* 60* 59* 52*  CREATININE 1.17 1.11 1.07 1.09 1.00  CALCIUM 8.3* 8.3* 7.9* 8.0* 7.8*   GFR: Estimated Creatinine Clearance: 103.1 mL/min (by C-G formula based on SCr of 1 mg/dL). Liver Function Tests: No results for input(s): AST, ALT, ALKPHOS, BILITOT, PROT, ALBUMIN in the last 168 hours. No results for input(s): LIPASE, AMYLASE in the last 168 hours. No results for input(s): AMMONIA in the last 168 hours. Coagulation Profile: No results for input(s): INR, PROTIME in the last 168 hours. Cardiac Enzymes: No results for input(s): CKTOTAL, CKMB, CKMBINDEX, TROPONINI in the last 168 hours. BNP (last 3 results) No results for input(s): PROBNP in the last 8760 hours. HbA1C: No results for input(s): HGBA1C in the last 72 hours. CBG: Recent Labs  Lab 06/09/20 2352 06/10/20 0351 06/10/20 0741 06/10/20 1152 06/10/20 1620  GLUCAP 119* 111* 101* 152* 157*   Lipid Profile: No results for input(s): CHOL, HDL, LDLCALC, TRIG, CHOLHDL,  LDLDIRECT in the last 72 hours. Thyroid Function Tests: No results for input(s): TSH, T4TOTAL, FREET4, T3FREE, THYROIDAB in the last 72 hours. Anemia Panel: No results for input(s): VITAMINB12, FOLATE, FERRITIN, TIBC, IRON, RETICCTPCT in the last 72  hours. Sepsis Labs: Recent Labs  Lab 06/04/20 0342 06/05/20 0311  PROCALCITON 1.65 0.94    Recent Results (from the past 240 hour(s))  Culture, blood (routine x 2)     Status: None   Collection Time: 05/31/20  7:58 PM   Specimen: BLOOD RIGHT HAND  Result Value Ref Range Status   Specimen Description   Final    BLOOD RIGHT HAND Performed at [facility] [address] [place], [place] [address]    Special Requests   Final    BOTTLES DRAWN AEROBIC AND ANAEROBIC Blood Culture adequate volume Performed at [facility] [address] [place], [place] [address]    Culture   Final    NO GROWTH 5 DAYS Performed at [facility], [place] [address] [place], [place] [address]    Report Status 06/06/2020 FINAL  Final  Culture, blood (Routine X 2) w Reflex to ID Panel     Status: None   Collection Time: 05/31/20  8:16 PM   Specimen: BLOOD  Result Value Ref Range Status   Specimen Description   Final    BLOOD RIGHT ANTECUBITAL Performed at [facility] [address], [place], [place] [address]    Special Requests   Final    BOTTLES DRAWN AEROBIC AND ANAEROBIC Blood Culture results may not be optimal due to an excessive volume of blood received in culture bottles Performed at [facility] [address], [place], [place] [address]    Culture   Final    NO GROWTH 5 DAYS Performed at [facility], [place] [address] [place], [place] [address]    Report Status 06/06/2020 FINAL  Final  Culture, blood (routine x 2)     Status: None   Collection Time: 05/31/20  8:26 PM   Specimen: BLOOD RIGHT HAND  Result Value Ref Range Status   Specimen Description   Final    BLOOD RIGHT HAND Performed at [facility] [address], [place], [place] [address]    Special Requests   Final    BOTTLES DRAWN AEROBIC ONLY Blood Culture results may not be optimal due to an excessive volume of blood  received in culture bottles Performed at [facility] [address] [place], [place] [address]    Culture   Final    NO GROWTH 5 DAYS Performed at [facility], [place] [address] [place], [place] [address]    Report Status 06/06/2020 FINAL  Final  Respiratory (~20 pathogens) panel by PCR     Status: None   Collection Time: 05/31/20 10:02 PM   Specimen: Nasopharyngeal Swab; Respiratory  Result Value Ref Range Status   Adenovirus NOT DETECTED NOT DETECTED Final   Coronavirus 229E NOT DETECTED NOT DETECTED Final    Comment: (NOTE) The Coronavirus on the Respiratory Panel, DOES NOT test for the novel  Coronavirus (2019 nCoV)    Coronavirus HKU1 NOT DETECTED NOT DETECTED Final   Coronavirus NL63 NOT DETECTED NOT DETECTED Final   Coronavirus OC43 NOT DETECTED NOT DETECTED Final   Metapneumovirus NOT DETECTED NOT DETECTED Final   Rhinovirus / Enterovirus NOT DETECTED NOT DETECTED Final   Influenza A NOT DETECTED NOT  DETECTED Final   Influenza B NOT DETECTED NOT DETECTED Final   Parainfluenza Virus 1 NOT DETECTED NOT DETECTED Final   Parainfluenza Virus 2 NOT DETECTED NOT DETECTED Final   Parainfluenza Virus 3 NOT DETECTED NOT DETECTED Final   Parainfluenza Virus 4 NOT DETECTED NOT DETECTED Final   Respiratory Syncytial Virus NOT DETECTED NOT DETECTED Final   Bordetella pertussis NOT DETECTED NOT DETECTED Final   Bordetella Parapertussis NOT DETECTED NOT DETECTED Final   Chlamydophila pneumoniae NOT DETECTED NOT DETECTED Final   Mycoplasma pneumoniae NOT DETECTED NOT DETECTED Final    Comment: Performed at [facility], [place] [address] [place], [place] [address]  Expectorated Sputum Assessment w Gram Stain, Rflx to Resp Cult     Status: None   Collection Time: 06/02/20  9:31 AM   Specimen: Sputum  Result Value Ref Range Status   Specimen Description SPUTUM  Final   Special Requests NONE  Final   Sputum evaluation   Final    THIS SPECIMEN IS ACCEPTABLE  FOR SPUTUM CULTURE Performed at [facility], [place] [address] [place], [place] [address]    Report Status 06/04/2020 FINAL  Final  Culture, Respiratory w Gram Stain     Status: None   Collection Time: 06/02/20  9:31 AM   Specimen: SPU  Result Value Ref Range Status   Specimen Description   Final    SPUTUM Performed at [facility] [address] [place], [place] [address]    Special Requests   Final    NONE Reflexed from [id number] Performed at [facility], [place] [address], [place], [place] [address]    Gram Stain   Final    RARE WBC PRESENT,BOTH PMN AND MONONUCLEAR FEW YEAST FEW GRAM POSITIVE COCCI IN PAIRS RARE GRAM NEGATIVE RODS    Culture   Final    FEW CANDIDA ALBICANS WITHIN NORMAL RESPIRATORY FLORA Performed at [facility], [place] [address] [place], [place] [address]    Report Status 06/05/2020 FINAL  Final  MRSA PCR Screening     Status: None   Collection Time: 06/02/20  9:59 PM   Specimen: Nasal Mucosa; Nasopharyngeal  Result Value Ref Range Status   MRSA by PCR NEGATIVE NEGATIVE Final    Comment:        The GeneXpert MRSA Assay (FDA approved for NASAL specimens only), is one component of a comprehensive MRSA colonization surveillance program. It is not intended to diagnose MRSA infection nor to guide or monitor treatment for MRSA infections. Performed at [facility], [place] [address], [place], [place] [address]          Radiology Studies: DG Chest Port 1 View  Result Date: 06/10/2020 CLINICAL DATA:  Acute respiratory failure EXAM: PORTABLE CHEST 1 VIEW COMPARISON:  Radiograph 06/07/2020, CT 06/03/2020 FINDINGS: Some slight interval increase in the extent of peripheral and basilar predominant mixed patchy and interstitial opacities throughout both lungs with some associated right fissural thickening and layering effusions. No pneumothorax. Stable cardiomediastinal contours. No  acute osseous or soft tissue abnormality. Degenerative changes are present in the imaged spine and shoulders. Telemetry leads and nasal cannula overlie the chest. IMPRESSION: Slight interval increase in the extent of peripheral and basilar predominant opacities throughout both lungs. May be accentuated by diminished lung volumes versus worsening airspace disease or edema. Electronically Signed   By: Lovena Le M.D.   On: 06/10/2020 06:42  Scheduled Meds: . benzonatate  200 mg Oral TID  . cefdinir  300 mg Oral Q12H  . Chlorhexidine Gluconate Cloth  6 each Topical Daily  . dextromethorphan  30 mg Oral BID  . doxycycline  100 mg Oral Q12H  . enoxaparin (LOVENOX) injection  75 mg Subcutaneous Q24H  . insulin aspart  3-9 Units Subcutaneous TID WC  . [START ON 06/11/2020] methylPREDNISolone (SOLU-MEDROL) injection  80 mg Intravenous Daily  . traZODone  100 mg Oral QHS   Continuous Infusions: . sodium chloride 10 mL/hr at 06/07/20 1200     Time spent: 25 minutes.      Bonnell Public, MD  [facility]   Call night coverage person covering after 7pm

## 2020-06-11 LAB — CBC WITH DIFFERENTIAL/PLATELET
Abs Immature Granulocytes: 0.28 10*3/uL — ABNORMAL HIGH (ref 0.00–0.07)
Basophils Absolute: 0 10*3/uL (ref 0.0–0.1)
Basophils Relative: 0 %
Eosinophils Absolute: 0.3 10*3/uL (ref 0.0–0.5)
Eosinophils Relative: 3 %
HCT: 31.1 % — ABNORMAL LOW (ref 39.0–52.0)
Hemoglobin: 9 g/dL — ABNORMAL LOW (ref 13.0–17.0)
Immature Granulocytes: 3 %
Lymphocytes Relative: 12 %
Lymphs Abs: 1.4 10*3/uL (ref 0.7–4.0)
MCH: 23.9 pg — ABNORMAL LOW (ref 26.0–34.0)
MCHC: 28.9 g/dL — ABNORMAL LOW (ref 30.0–36.0)
MCV: 82.5 fL (ref 80.0–100.0)
Monocytes Absolute: 0.9 10*3/uL (ref 0.1–1.0)
Monocytes Relative: 8 %
Neutro Abs: 8.2 10*3/uL — ABNORMAL HIGH (ref 1.7–7.7)
Neutrophils Relative %: 74 %
Platelets: 264 10*3/uL (ref 150–400)
RBC: 3.77 MIL/uL — ABNORMAL LOW (ref 4.22–5.81)
RDW: 18.5 % — ABNORMAL HIGH (ref 11.5–15.5)
WBC: 11 10*3/uL — ABNORMAL HIGH (ref 4.0–10.5)
nRBC: 0 % (ref 0.0–0.2)

## 2020-06-11 LAB — RENAL FUNCTION PANEL
Albumin: 2.5 g/dL — ABNORMAL LOW (ref 3.5–5.0)
Anion gap: 7 (ref 5–15)
BUN: 41 mg/dL — ABNORMAL HIGH (ref 8–23)
CO2: 27 mmol/L (ref 22–32)
Calcium: 8 mg/dL — ABNORMAL LOW (ref 8.9–10.3)
Chloride: 106 mmol/L (ref 98–111)
Creatinine, Ser: 0.96 mg/dL (ref 0.61–1.24)
GFR, Estimated: >60 mL/min (ref >60)
Glucose, Bld: 105 mg/dL — ABNORMAL HIGH (ref 70–99)
Phosphorus: 3.2 mg/dL (ref 2.5–4.6)
Potassium: 4.6 mmol/L (ref 3.5–5.1)
Sodium: 140 mmol/L (ref 135–145)

## 2020-06-11 LAB — GLUCOSE, CAPILLARY
Glucose-Capillary: 102 mg/dL — ABNORMAL HIGH (ref 70–99)
Glucose-Capillary: 121 mg/dL — ABNORMAL HIGH (ref 70–99)
Glucose-Capillary: 156 mg/dL — ABNORMAL HIGH (ref 70–99)
Glucose-Capillary: 87 mg/dL (ref 70–99)
Glucose-Capillary: 91 mg/dL (ref 70–99)
Glucose-Capillary: 96 mg/dL (ref 70–99)

## 2020-06-11 LAB — MAGNESIUM: Magnesium: 2.2 mg/dL (ref 1.7–2.4)

## 2020-06-11 MED ORDER — PREDNISONE 20 MG PO TABS
60.0000 mg | ORAL_TABLET | Freq: Every day | ORAL | Status: DC
  Administered 2020-06-12: 60 mg via ORAL
  Filled 2020-06-11: qty 3

## 2020-06-11 NOTE — [place] FL2 (Signed)
[place] LEVEL OF CARE SCREENING TOOL     IDENTIFICATION  Patient Name: Jacob Mitchell Birthdate: [birth year] Sex: male Admission Date (Current Location): 05/31/2020  [facility] and [place] Number:  Herbalist and Address:  [facility],  [place] [address], [place]      Provider Number: [id number]  Attending Physician Name and Address:  Shelly Coss, MD  Relative Name and Phone Number:       Current Level of Care: Hospital Recommended Level of Care: [facility] Prior Approval Number:    Date Approved/Denied:   PASRR Number: [id number]  Discharge Plan: SNF    Current Diagnoses: Patient Active Problem List   Diagnosis Date Noted  . Severe comorbid illness   . Low O2 saturation   . Prosthetic hip infection ([place])   . PICC line infection, initial encounter   . Pulmonary eosinophilia ([place])   . Drug-induced lung disease   . Acute respiratory failure with hypoxia ([place]) 06/03/2020  . Sepsis ([place]) 05/31/2020  . HCAP (healthcare-associated pneumonia) 05/31/2020  . Cellulitis 05/31/2020  . 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 Greater trochanter fracture ([place]) 01/17/2020  . Cough 05/14/2018  . Insomnia 10/21/2017  . Obese 10/15/2016  . 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 History of nephrolithiasis 07/24/2016  . Neuropathy 07/24/2016  . S/P left THA, AA 11/23/2015    Orientation RESPIRATION BLADDER Height & Weight     Self,Time,Situation,Place  O2 Continent Weight: (!) 149.4 kg Height:  6\' 7"  (200.7 cm)  BEHAVIORAL SYMPTOMS/MOOD NEUROLOGICAL BOWEL NUTRITION STATUS      Continent Diet (Carb modified)  AMBULATORY STATUS COMMUNICATION OF NEEDS Skin   Limited Assist Verbally Skin abrasions (Groin rash. Scratches to arm)                       Personal Care Assistance Level of Assistance  Bathing,Feeding,Dressing Bathing Assistance: Limited assistance Feeding assistance: Independent Dressing  Assistance: Limited assistance     Functional Limitations Info  Sight,Hearing,Speech Sight Info: Impaired Hearing Info: Impaired Speech Info: Adequate    SPECIAL CARE FACTORS FREQUENCY  PT (By licensed PT),OT (By licensed OT)     PT Frequency: 5 x weekly OT Frequency: 5 x weekly            Contractures Contractures Info: Not present    Additional Factors Info  Code Status,Allergies Code Status Info: Full Allergies Info: Aspartame and Phenylalanine, Daptomycin           Current Medications (06/11/2020):  This is the current hospital active medication list Current Facility-Administered Medications  Medication Dose Route Frequency Provider Last Rate Last Admin  . 0.9 %  sodium chloride infusion   Intravenous PRN Harold Hedge, MD 10 mL/hr at 06/07/20 1200 Infusion Verify at 06/07/20 1200  . acetaminophen (TYLENOL) tablet 650 mg  650 mg Oral Q6H PRN Elwyn Reach, MD       Or  . acetaminophen (TYLENOL) suppository 650 mg  650 mg Rectal Q6H PRN Elwyn Reach, MD      . benzonatate (TESSALON) capsule 200 mg  200 mg Oral TID Rigoberto Noel, MD   200 mg at 06/11/20 1012  . cefdinir (OMNICEF) capsule 300 mg  300 mg Oral Q12H Tommy Medal, Lavell Islam, MD   300 mg at 06/11/20 1011  . Chlorhexidine Gluconate Cloth 2 % PADS 6 each  6 each Topical Daily Harold Hedge, MD  6 each at 06/10/20 1032  . dextromethorphan (DELSYM) 30 MG/5ML liquid 30 mg  30 mg Oral BID Rigoberto Noel, MD   30 mg at 06/11/20 1011  . doxycycline (VIBRA-TABS) tablet 100 mg  100 mg Oral Q12H Tommy Medal, Lavell Islam, MD   100 mg at 06/11/20 1011  . enoxaparin (LOVENOX) injection 75 mg  75 mg Subcutaneous Q24H Harold Hedge, MD   75 mg at 06/11/20 1010  . HYDROcodone bit-homatropine (HYCODAN) 5-1.5 MG/5ML syrup 5 mL  5 mL Oral Q4H PRN Charlesetta Shanks, MD   5 mL at 06/11/20 0501  . insulin aspart (novoLOG) injection 3-9 Units  3-9 Units Subcutaneous TID WC Rigoberto Noel, MD   3 Units at 06/11/20 1209  .  ipratropium-albuterol (DUONEB) 0.5-2.5 (3) MG/3ML nebulizer solution 3 mL  3 mL Nebulization Q2H PRN Charlesetta Shanks, MD   3 mL at 06/03/20 0300  . methylPREDNISolone sodium succinate (SOLU-MEDROL) 125 mg/2 mL injection 80 mg  80 mg Intravenous Daily Rigoberto Noel, MD   80 mg at 06/11/20 1010  . ondansetron (ZOFRAN) tablet 4 mg  4 mg Oral Q6H PRN Elwyn Reach, MD       Or  . ondansetron (ZOFRAN) injection 4 mg  4 mg Intravenous Q6H PRN Gala Romney L, MD      . phenol (CHLORASEPTIC) mouth spray 1 spray  1 spray Mouth/Throat PRN Harold Hedge, MD   1 spray at 06/08/20 2318  . traZODone (DESYREL) tablet 100 mg  100 mg Oral QHS [place] Crane M, PA-C   100 mg at 06/10/20 2143     Discharge Medications: Please see discharge summary for a list of discharge medications.  Relevant Imaging Results:  Relevant Lab Results:   Additional Information ss# [id number]  Bronte Sabado, Marjie Skiff, RN

## 2020-06-11 NOTE — TOC Progression Note (Signed)
Transition of Care [facility]) - Progression Note    Patient Details  Name: Jacob Mitchell MRN: [id number] Date of Birth: 1945-04-22  Transition of Care [facility]) CM/SW Contact  Demontae Antunes, Marjie Skiff, RN Phone Number: 06/11/2020, 2:01 PM  Clinical Narrative:    Spoke with pt and wife at bedside for dc planning. Permission given to fax out FL2 to area facilities for SNF placement.TOC will follow up with SNF bed offers when available.   Expected Discharge Plan: [place] Barriers to Discharge: No Barriers Identified  Expected Discharge Plan and Services Expected Discharge Plan: [place] arrangements for the past 2 months: Single Family Home                   Social Determinants of Health (SDOH) Interventions    Readmission Risk Interventions Readmission Risk Prevention Plan 05/03/2020  Post Dischage Appt Complete  Medication Screening Complete  Transportation Screening Complete  Some recent data might be hidden

## 2020-06-11 NOTE — Care Management Important Message (Signed)
Important Message  Patient Details IM Letter given to the Patient. Name: Jacob Mitchell MRN: [id number] Date of Birth: 1945-05-13   Medicare Important Message Given:  Yes     Kerin Salen 06/11/2020, 10:39 AM

## 2020-06-11 NOTE — Progress Notes (Signed)
Subjective:  Patient continues to do better and better  Antibiotics:  Anti-infectives (From admission, onward)   Start     Dose/Rate Route Frequency Ordered Stop   06/07/20 1000  cefdinir (OMNICEF) capsule 300 mg        300 mg Oral Every 12 hours 06/06/20 1002     06/07/20 1000  doxycycline (VIBRA-TABS) tablet 100 mg        100 mg Oral Every 12 hours 06/06/20 1002     06/03/20 1800  vancomycin (VANCOREADY) IVPB 1000 mg/200 mL        1,000 mg 200 mL/hr over 60 Minutes Intravenous Every 12 hours 06/03/20 0838 06/06/20 1945   06/03/20 1030  azithromycin (ZITHROMAX) 500 mg in sodium chloride 0.9 % 250 mL IVPB  Status:  Discontinued        500 mg 250 mL/hr over 60 Minutes Intravenous Every 24 hours 06/03/20 0931 06/06/20 0758   06/03/20 1000  azithromycin (ZITHROMAX) tablet 250 mg  Status:  Discontinued       "Followed by" Linked Group Details   250 mg Oral Daily 06/02/20 0942 06/03/20 0931   06/02/20 1030  azithromycin (ZITHROMAX) tablet 500 mg       "Followed by" Linked Group Details   500 mg Oral Daily 06/02/20 0942 06/02/20 1042   06/02/20 0000  vancomycin (VANCOREADY) IVPB 1500 mg/300 mL  Status:  Discontinued        1,500 mg 150 mL/hr over 120 Minutes Intravenous Every 24 hours 06/01/20 0129 06/03/20 0838   06/01/20 0800  ceFEPIme (MAXIPIME) 2 g in sodium chloride 0.9 % 100 mL IVPB        2 g 200 mL/hr over 30 Minutes Intravenous Every 8 hours 06/01/20 0129 06/06/20 1837   06/01/20 0400  vancomycin (VANCOREADY) IVPB 1000 mg/200 mL  Status:  Discontinued        1,000 mg 200 mL/hr over 60 Minutes Intravenous  Once 06/01/20 0300 06/01/20 0307   06/01/20 0400  ceFEPIme (MAXIPIME) 2 g in sodium chloride 0.9 % 100 mL IVPB  Status:  Discontinued        2 g 200 mL/hr over 30 Minutes Intravenous  Once 06/01/20 0300 06/01/20 0307   05/31/20 2245  vancomycin (VANCOCIN) IVPB 1000 mg/200 mL premix  Status:  Discontinued        1,000 mg 200 mL/hr over 60 Minutes Intravenous   Once 05/31/20 2232 05/31/20 2235   05/31/20 2245  ceFEPIme (MAXIPIME) 2 g in sodium chloride 0.9 % 100 mL IVPB        2 g 200 mL/hr over 30 Minutes Intravenous  Once 05/31/20 2232 06/01/20 0132   05/31/20 2245  vancomycin (VANCOREADY) IVPB 2000 mg/400 mL        2,000 mg 200 mL/hr over 120 Minutes Intravenous  Once 05/31/20 2235 06/01/20 0233      Medications: Scheduled Meds: . benzonatate  200 mg Oral TID  . cefdinir  300 mg Oral Q12H  . Chlorhexidine Gluconate Cloth  6 each Topical Daily  . dextromethorphan  30 mg Oral BID  . doxycycline  100 mg Oral Q12H  . enoxaparin (LOVENOX) injection  75 mg Subcutaneous Q24H  . insulin aspart  3-9 Units Subcutaneous TID WC  . methylPREDNISolone (SOLU-MEDROL) injection  80 mg Intravenous Daily  . traZODone  100 mg Oral QHS   Continuous Infusions: . sodium chloride 10 mL/hr at 06/07/20 1200   PRN Meds:.sodium chloride, acetaminophen **OR** acetaminophen, HYDROcodone  bit-homatropine, ipratropium-albuterol, ondansetron **OR** ondansetron (ZOFRAN) IV, phenol    Objective: Weight change:   Intake/Output Summary (Last 24 hours) at 06/11/2020 1438 Last data filed at 06/11/2020 1300 Gross per 24 hour  Intake 600 ml  Output 2025 ml  Net -1425 ml   Blood pressure (!) 136/52, pulse (!) 58, temperature 98 F (36.7 C), temperature source Oral, resp. rate 18, height 6\' 7"  (2.007 m), weight (!) 149.4 kg, SpO2 93 %. Temp:  [97.4 F (36.3 C)-98 F (36.7 C)] 98 F (36.7 C) (05/02 1239) Pulse Rate:  [54-61] 58 (05/02 1239) Resp:  [18] 18 (05/02 1239) BP: (117-138)/(52-70) 136/52 (05/02 1239) SpO2:  [89 %-93 %] 93 % (05/02 1239)  Physical Exam: Physical Exam Constitutional:      Appearance: He is well-developed.  HENT:     Head: Normocephalic and atraumatic.  Eyes:     Conjunctiva/sclera: Conjunctivae normal.  Cardiovascular:     Rate and Rhythm: Regular rhythm. Tachycardia present.     Heart sounds: No murmur heard. No friction rub. No  gallop.   Pulmonary:     Effort: No respiratory distress.     Breath sounds: No stridor. No wheezing or rhonchi.  Abdominal:     General: There is no distension.     Palpations: Abdomen is soft.  Musculoskeletal:        General: Normal range of motion.     Cervical back: Normal range of motion and neck supple.  Skin:    General: Skin is warm and dry.     Coloration: Skin is not pale.     Findings: No erythema or rash.  Neurological:     General: No focal deficit present.     Mental Status: He is alert and oriented to person, place, and time.  Psychiatric:        Mood and Affect: Mood normal.        Behavior: Behavior normal.        Thought Content: Thought content normal.        Judgment: Judgment normal.      CBC:    BMET Recent Labs    06/11/20 0603  NA 140  K 4.6  CL 106  CO2 27  GLUCOSE 105*  BUN 41*  CREATININE 0.96  CALCIUM 8.0*     Liver Panel  Recent Labs    06/11/20 0603  ALBUMIN 2.5*       Sedimentation Rate No results for input(s): ESRSEDRATE in the last 72 hours. C-Reactive Protein No results for input(s): CRP in the last 72 hours.  Micro Results: Recent Results (from the past 720 hour(s))  Culture, blood (routine x 2)     Status: None   Collection Time: 05/31/20  7:58 PM   Specimen: BLOOD RIGHT HAND  Result Value Ref Range Status   Specimen Description   Final    BLOOD RIGHT HAND Performed at [facility] [address], [place], [place] [address]    Special Requests   Final    BOTTLES DRAWN AEROBIC AND ANAEROBIC Blood Culture adequate volume Performed at [facility] [address] [place], [place] [address]    Culture   Final    NO GROWTH 5 DAYS Performed at [facility], [place] [address], [place], [place] [address]    Report Status 06/06/2020 FINAL  Final  Culture, blood (Routine X 2) w Reflex to ID Panel     Status: None   Collection Time: 05/31/20  8:16 PM   Specimen: BLOOD   Result Value Ref Range Status   Specimen Description   Final    BLOOD RIGHT ANTECUBITAL Performed at [facility] [address] [place], [place] [address]    Special Requests   Final    BOTTLES DRAWN AEROBIC AND ANAEROBIC Blood Culture results may not be optimal due to an excessive volume of blood received in culture bottles Performed at [facility] [address] [place], [place] [address]    Culture   Final    NO GROWTH 5 DAYS Performed at [facility], [place] [address], [place], [place] [address]    Report Status 06/06/2020 FINAL  Final  Culture, blood (routine x 2)     Status: None   Collection Time: 05/31/20  8:26 PM   Specimen: BLOOD RIGHT HAND  Result Value Ref Range Status   Specimen Description   Final    BLOOD RIGHT HAND Performed at [facility] [address], [place], [place] [address]    Special Requests   Final    BOTTLES DRAWN AEROBIC ONLY Blood Culture results may not be optimal due to an excessive volume of blood received in culture bottles Performed at [facility] [address], [place], [place] [address]    Culture   Final    NO GROWTH 5 DAYS Performed at [facility], [place] [address] [place], [place] [address]    Report Status 06/06/2020 FINAL  Final  Respiratory (~20 pathogens) panel by PCR     Status: None   Collection Time: 05/31/20 10:02 PM   Specimen: Nasopharyngeal Swab; Respiratory  Result Value Ref Range Status   Adenovirus NOT DETECTED NOT DETECTED Final   Coronavirus 229E NOT DETECTED NOT DETECTED Final    Comment: (NOTE) The Coronavirus on the Respiratory Panel, DOES NOT test for the novel  Coronavirus (2019 nCoV)    Coronavirus HKU1 NOT DETECTED NOT DETECTED Final   Coronavirus NL63 NOT DETECTED NOT DETECTED Final   Coronavirus OC43 NOT DETECTED NOT DETECTED Final   Metapneumovirus NOT DETECTED NOT DETECTED Final   Rhinovirus / Enterovirus NOT  DETECTED NOT DETECTED Final   Influenza A NOT DETECTED NOT DETECTED Final   Influenza B NOT DETECTED NOT DETECTED Final   Parainfluenza Virus 1 NOT DETECTED NOT DETECTED Final   Parainfluenza Virus 2 NOT DETECTED NOT DETECTED Final   Parainfluenza Virus 3 NOT DETECTED NOT DETECTED Final   Parainfluenza Virus 4 NOT DETECTED NOT DETECTED Final   Respiratory Syncytial Virus NOT DETECTED NOT DETECTED Final   Bordetella pertussis NOT DETECTED NOT DETECTED Final   Bordetella Parapertussis NOT DETECTED NOT DETECTED Final   Chlamydophila pneumoniae NOT DETECTED NOT DETECTED Final   Mycoplasma pneumoniae NOT DETECTED NOT DETECTED Final    Comment: Performed at [facility], [place]. [address] [place], [place] [address]  Expectorated Sputum Assessment w Gram Stain, Rflx to Resp Cult     Status: None   Collection Time: 06/02/20  9:31 AM   Specimen: Sputum  Result Value Ref Range Status   Specimen Description SPUTUM  Final   Special Requests NONE  Final   Sputum evaluation   Final    THIS SPECIMEN IS ACCEPTABLE FOR SPUTUM CULTURE Performed at [facility], [place] [address], [place],  [address]    Report Status 06/04/2020 FINAL  Final  Culture, Respiratory w Gram Stain     Status: None  Collection Time: 06/02/20  9:31 AM   Specimen: SPU  Result Value Ref Range Status   Specimen Description   Final    SPUTUM Performed at [facility] [address], [place], [place] [address]    Special Requests   Final    NONE Reflexed from [phone] Performed at [facility], [place] [address], [place], [place] [address]    Gram Stain   Final    RARE WBC PRESENT,BOTH PMN AND MONONUCLEAR FEW YEAST FEW GRAM POSITIVE COCCI IN PAIRS RARE GRAM NEGATIVE RODS    Culture   Final    FEW CANDIDA ALBICANS WITHIN NORMAL RESPIRATORY FLORA Performed at [facility], [place] [address], [place], [place] [address]    Report Status 06/05/2020 FINAL   Final  MRSA PCR Screening     Status: None   Collection Time: 06/02/20  9:59 PM   Specimen: Nasal Mucosa; Nasopharyngeal  Result Value Ref Range Status   MRSA by PCR NEGATIVE NEGATIVE Final    Comment:        The GeneXpert MRSA Assay (FDA approved for NASAL specimens only), is one component of a comprehensive MRSA colonization surveillance program. It is not intended to diagnose MRSA infection nor to guide or monitor treatment for MRSA infections. Performed at [facility], [place] [address], [place], [place] [address]     Studies/Results: DG Chest Port 1 View  Result Date: 06/10/2020 CLINICAL DATA:  Acute respiratory failure EXAM: PORTABLE CHEST 1 VIEW COMPARISON:  Radiograph 06/07/2020, CT 06/03/2020 FINDINGS: Some slight interval increase in the extent of peripheral and basilar predominant mixed patchy and interstitial opacities throughout both lungs with some associated right fissural thickening and layering effusions. No pneumothorax. Stable cardiomediastinal contours. No acute osseous or soft tissue abnormality. Degenerative changes are present in the imaged spine and shoulders. Telemetry leads and nasal cannula overlie the chest. IMPRESSION: Slight interval increase in the extent of peripheral and basilar predominant opacities throughout both lungs. May be accentuated by diminished lung volumes versus worsening airspace disease or edema. Electronically Signed   By: Lovena Le M.D.   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 He was changed to vancomycin and cefepime. 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 Tommy Medal  The [facility] for Infectious Disease is located in the  [facility] at  [place] in [place].  [address], which is located to the left of the elevators.  Phone: [phone]  Fax: [phone]  [email or web address]  He should arrive 15-30 minutes before his appointment.       LOS: 11 days   Alcide Evener 06/11/2020, 2:38 PM

## 2020-06-11 NOTE — Progress Notes (Signed)
PROGRESS NOTE    Jacob Mitchell  RXV:[id number] DOB: 21-Mar-1945 DOA: 05/31/2020 PCP: [name], NP   Chief Complain: Swelling/pain around the PICC line, shortness of breath, cough  Brief Narrative:  Patient is a 75 year old male with history of multiple orthopedic surgeries including left hip replacement, recurrent seromas, left hip prosthetic joint infection on IV antibiotics daptomycin and ceftriaxone through PICC line BPH who presented to the emergency department with complaints of left upper extremity swelling and pain around the PICC line site, shortness of breath and cough.  CT imaging done on presentation showed bilateral patchy groundglass opacity consistent with multilobar pneumonia.  CT was negative for PE.  On 06/09/2020 he developed worsening respiratory failure requiring BiPAP and was transferred to ICU.  He required high flow oxygen for maintenance of saturation.  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  ID/orthopedics, pulmonology were consulted and following.  Currently respiratory status has improved.  Antibiotics changed to oral.  Plan is to continue slow tapering of prednisone.  PT/OT recommended skilled nursing facility on discharge.  Patient is medically stable for discharge to skilled nursing facility as soon as bed is available.  Assessment & Plan:   Principal Problem:   Sepsis ([place]) Active Problems:   S/P left THA, AA   S/P revision of total hip   Obese   HCAP (healthcare-associated pneumonia)   Cellulitis   Acute respiratory failure with hypoxia (HCC)   PICC line infection, initial encounter   Pulmonary eosinophilia (HCC)   Drug-induced lung disease   Prosthetic hip infection (HCC)   Low O2 saturation   Severe comorbid illness   Eosinophilic pneumonia/acute hypoxic respiratory failure: Presented with shortness of breath, cough.  Developed acute respiratory failure with hypoxia and had to be moved to ICU, required BiPAP.   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  PCCM was following. Responded to steroids.  IV steroids changed to oral.  Pulmonology recommended slow tapering of steroid: Taper 10 mg every 5 days.  Follow-up with pulmonology as an outpatient in 2 weeks. Currently still requiring some oxygen but respiratory status significantly improved.  Continue antitussives, cough syrup.  He completed 7 days of therapy for possible pneumonia because CT imaging showed bilateral patchy groundglass opacity consistent with multifocal pneumonia on presentation  Left hip prosthetic joint infection/recurrent left hip seroma: He was on daptomycin and ceftriaxone via left upper extremity PICC line.  PICC line has been removed on 4/22.  IV antibiotics discontinued.  Currently on doxycycline and cefdinir.  Left upper extremity thrombophlebitis: Most likely through the PICC line.  IV antibiotics discontinued.  Edema of left upper extremity improving  Deconditioning/debility: PT/OT recommended skilled nursing facility on discharge.  TOC following  Moderate obesity: BMI 37.1         DVT prophylaxis:Lovenox Code Status: Full Family Communication: Wife at the bedside Status is: Inpatient  Remains inpatient appropriate because:Inpatient level of care appropriate due to severity of illness   Dispo: The patient is from: Home              Anticipated d/c is to: SNF              Patient currently is not medically stable to d/c.   Difficult to place patient No   Consultants: ID,PCCM  Procedures:None  Antimicrobials:  Anti-infectives (From admission, onward)   Start     Dose/Rate Route Frequency Ordered Stop   06/07/20 1000  cefdinir (OMNICEF) capsule 300 mg  300 mg Oral Every 12 hours 06/06/20 1002     06/07/20 1000  doxycycline (VIBRA-TABS) tablet 100 mg        100 mg Oral Every 12 hours 06/06/20 1002     06/03/20 1800  vancomycin (VANCOREADY) IVPB 1000 mg/200 mL        1,000 mg 200  mL/hr over 60 Minutes Intravenous Every 12 hours 06/03/20 0838 06/06/20 1945   06/03/20 1030  azithromycin (ZITHROMAX) 500 mg in sodium chloride 0.9 % 250 mL IVPB  Status:  Discontinued        500 mg 250 mL/hr over 60 Minutes Intravenous Every 24 hours 06/03/20 0931 06/06/20 0758   06/03/20 1000  azithromycin (ZITHROMAX) tablet 250 mg  Status:  Discontinued       "Followed by" Linked Group Details   250 mg Oral Daily 06/02/20 0942 06/03/20 0931   06/02/20 1030  azithromycin (ZITHROMAX) tablet 500 mg       "Followed by" Linked Group Details   500 mg Oral Daily 06/02/20 0942 06/02/20 1042   06/02/20 0000  vancomycin (VANCOREADY) IVPB 1500 mg/300 mL  Status:  Discontinued        1,500 mg 150 mL/hr over 120 Minutes Intravenous Every 24 hours 06/01/20 0129 06/03/20 0838   06/01/20 0800  ceFEPIme (MAXIPIME) 2 g in sodium chloride 0.9 % 100 mL IVPB        2 g 200 mL/hr over 30 Minutes Intravenous Every 8 hours 06/01/20 0129 06/06/20 1837   06/01/20 0400  vancomycin (VANCOREADY) IVPB 1000 mg/200 mL  Status:  Discontinued        1,000 mg 200 mL/hr over 60 Minutes Intravenous  Once 06/01/20 0300 06/01/20 0307   06/01/20 0400  ceFEPIme (MAXIPIME) 2 g in sodium chloride 0.9 % 100 mL IVPB  Status:  Discontinued        2 g 200 mL/hr over 30 Minutes Intravenous  Once 06/01/20 0300 06/01/20 0307   05/31/20 2245  vancomycin (VANCOCIN) IVPB 1000 mg/200 mL premix  Status:  Discontinued        1,000 mg 200 mL/hr over 60 Minutes Intravenous  Once 05/31/20 2232 05/31/20 2235   05/31/20 2245  ceFEPIme (MAXIPIME) 2 g in sodium chloride 0.9 % 100 mL IVPB        2 g 200 mL/hr over 30 Minutes Intravenous  Once 05/31/20 2232 06/01/20 0132   05/31/20 2245  vancomycin (VANCOREADY) IVPB 2000 mg/400 mL        2,000 mg 200 mL/hr over 120 Minutes Intravenous  Once 05/31/20 2235 06/01/20 0233      Subjective:  Patient seen and examined the bedside this morning.  Sitting on the chair.  Very comfortable.  On 4 L of  oxygen per minute.  Denies any shortness of breath or cough.  Denies any hip pain  Objective: Vitals:   06/10/20 1336 06/10/20 2229 06/11/20 0449 06/11/20 1239  BP: (!) 107/56 117/63 138/70 (!) 136/52  Pulse: 61 61 (!) 54 (!) 58  Resp: 17 18 18 18   Temp: 98.5 F (36.9 C) 97.9 F (36.6 C) (!) 97.4 F (36.3 C) 98 F (36.7 C)  TempSrc: Oral Oral Oral Oral  SpO2: 96% 92% (!) 89% 93%  Weight:      Height:        Intake/Output Summary (Last 24 hours) at 06/11/2020 1501 Last data filed at 06/11/2020 1300 Gross per 24 hour  Intake 600 ml  Output 2025 ml  Net -1425 ml  Filed Weights   05/31/20 1741 06/02/20 2100  Weight: 136.1 kg (!) 149.4 kg    Examination:  General exam: Appears calm and comfortable ,Not in distress, morbidly obese  HEENT:PERRL,Oral mucosa moist, Ear/Nose normal on gross exam Respiratory system: Bilateral diminished air entry in the bases, few crackles on the right side Cardiovascular system: S1 & S2 heard, RRR. No JVD, murmurs, rubs, gallops or clicks. Gastrointestinal system: Abdomen is nondistended, soft and nontender. No organomegaly or masses felt. Normal bowel sounds heard. Central nervous system: Alert and oriented. No focal neurological deficits. Extremities: Edema of the left upper extremity, no clubbing ,no cyanosis Skin: No rashes, lesions or ulcers,no icterus ,no pallor    Data Reviewed: I have personally reviewed following labs and imaging studies  CBC: Recent Labs  Lab 06/05/20 0311 06/06/20 0930 06/07/20 0301 06/08/20 0258 06/11/20 0603  WBC 18.7* 15.0* 14.5* 13.9* 11.0*  NEUTROABS  --   --   --   --  8.2*  HGB 8.8* 8.9* 8.4* 8.7* 9.0*  HCT 29.5* 28.9* 28.3* 30.0* 31.1*  MCV 83.1 79.2* 82.5 82.0 82.5  PLT 339 213 250 251 102   Basic Metabolic Panel: Recent Labs  Lab 06/05/20 0311 06/06/20 0711 06/07/20 0301 06/08/20 0258 06/11/20 0603  NA 138 136 138 138 140  K 4.1 4.5 4.4 4.9 4.6  CL 109 108 110 109 106  CO2 22 18* 20* 21*  27  GLUCOSE 156* 127* 124* 139* 105*  BUN 55* 60* 59* 52* 41*  CREATININE 1.11 1.07 1.09 1.00 0.96  CALCIUM 8.3* 7.9* 8.0* 7.8* 8.0*  MG  --   --   --   --  2.2  PHOS  --   --   --   --  3.2   GFR: Estimated Creatinine Clearance: 107.4 mL/min (by C-G formula based on SCr of 0.96 mg/dL). Liver Function Tests: Recent Labs  Lab 06/11/20 0603  ALBUMIN 2.5*   No results for input(s): LIPASE, AMYLASE in the last 168 hours. No results for input(s): AMMONIA in the last 168 hours. Coagulation Profile: No results for input(s): INR, PROTIME in the last 168 hours. Cardiac Enzymes: No results for input(s): CKTOTAL, CKMB, CKMBINDEX, TROPONINI in the last 168 hours. BNP (last 3 results) No results for input(s): PROBNP in the last 8760 hours. HbA1C: No results for input(s): HGBA1C in the last 72 hours. CBG: Recent Labs  Lab 06/10/20 2016 06/10/20 2359 06/11/20 0445 06/11/20 0726 06/11/20 1149  GLUCAP 95 96 87 91 121*   Lipid Profile: No results for input(s): CHOL, HDL, LDLCALC, TRIG, CHOLHDL, LDLDIRECT in the last 72 hours. Thyroid Function Tests: No results for input(s): TSH, T4TOTAL, FREET4, T3FREE, THYROIDAB in the last 72 hours. Anemia Panel: No results for input(s): VITAMINB12, FOLATE, FERRITIN, TIBC, IRON, RETICCTPCT in the last 72 hours. Sepsis Labs: Recent Labs  Lab 06/05/20 0311  PROCALCITON 0.94    Recent Results (from the past 240 hour(s))  Expectorated Sputum Assessment w Gram Stain, Rflx to Resp Cult     Status: None   Collection Time: 06/02/20  9:31 AM   Specimen: Sputum  Result Value Ref Range Status   Specimen Description SPUTUM  Final   Special Requests NONE  Final   Sputum evaluation   Final    THIS SPECIMEN IS ACCEPTABLE FOR SPUTUM CULTURE Performed at [facility], [place] [address] [place], [place] [address]    Report Status 06/04/2020 FINAL  Final  Culture, Respiratory w Gram Stain  Status: None   Collection Time: 06/02/20  9:31  AM   Specimen: SPU  Result Value Ref Range Status   Specimen Description   Final    SPUTUM Performed at [facility] [address], [place], [place] [address]    Special Requests   Final    NONE Reflexed from [id number] Performed at [facility], [employer] [address] [place], [place] [address]    Gram Stain   Final    RARE WBC PRESENT,BOTH PMN AND MONONUCLEAR FEW YEAST FEW GRAM POSITIVE COCCI IN PAIRS RARE GRAM NEGATIVE RODS    Culture   Final    FEW CANDIDA ALBICANS WITHIN NORMAL RESPIRATORY FLORA Performed at [facility], [place] [address] [place], [place] [address]    Report Status 06/05/2020 FINAL  Final  MRSA PCR Screening     Status: None   Collection Time: 06/02/20  9:59 PM   Specimen: Nasal Mucosa; Nasopharyngeal  Result Value Ref Range Status   MRSA by PCR NEGATIVE NEGATIVE Final    Comment:        The GeneXpert MRSA Assay (FDA approved for NASAL specimens only), is one component of a comprehensive MRSA colonization surveillance program. It is not intended to diagnose MRSA infection nor to guide or monitor treatment for MRSA infections. Performed at [facility], [place] [address] [place], [place] [address]          Radiology Studies: DG Chest Port 1 View  Result Date: 06/10/2020 CLINICAL DATA:  Acute respiratory failure EXAM: PORTABLE CHEST 1 VIEW COMPARISON:  Radiograph 06/07/2020, CT 06/03/2020 FINDINGS: Some slight interval increase in the extent of peripheral and basilar predominant mixed patchy and interstitial opacities throughout both lungs with some associated right fissural thickening and layering effusions. No pneumothorax. Stable cardiomediastinal contours. No acute osseous or soft tissue abnormality. Degenerative changes are present in the imaged spine and shoulders. Telemetry leads and nasal cannula overlie the chest. IMPRESSION: Slight interval increase in the extent of peripheral and basilar  predominant opacities throughout both lungs. May be accentuated by diminished lung volumes versus worsening airspace disease or edema. Electronically Signed   By: Lovena Le M.D.   On: 06/10/2020 06:42        Scheduled Meds: . benzonatate  200 mg Oral TID  . cefdinir  300 mg Oral Q12H  . Chlorhexidine Gluconate Cloth  6 each Topical Daily  . dextromethorphan  30 mg Oral BID  . doxycycline  100 mg Oral Q12H  . enoxaparin (LOVENOX) injection  75 mg Subcutaneous Q24H  . insulin aspart  3-9 Units Subcutaneous TID WC  . methylPREDNISolone (SOLU-MEDROL) injection  80 mg Intravenous Daily  . traZODone  100 mg Oral QHS   Continuous Infusions: . sodium chloride 10 mL/hr at 06/07/20 1200     LOS: 11 days    Time spent: More than 50% of that time was spent in counseling and/or coordination of care.      Shelly Coss, MD [facility] P5/03/2020, 3:01 PM

## 2020-06-12 DIAGNOSIS — R2232 Localized swelling, mass and lump, left upper limb: Secondary | ICD-10-CM | POA: Diagnosis not present

## 2020-06-12 DIAGNOSIS — Z96642 Presence of left artificial hip joint: Secondary | ICD-10-CM | POA: Diagnosis not present

## 2020-06-12 DIAGNOSIS — J8281 Chronic eosinophilic pneumonia: Secondary | ICD-10-CM | POA: Diagnosis not present

## 2020-06-12 DIAGNOSIS — A419 Sepsis, unspecified organism: Secondary | ICD-10-CM | POA: Diagnosis not present

## 2020-06-12 DIAGNOSIS — J8289 Other pulmonary eosinophilia, not elsewhere classified: Secondary | ICD-10-CM | POA: Diagnosis not present

## 2020-06-12 DIAGNOSIS — E669 Obesity, unspecified: Secondary | ICD-10-CM | POA: Diagnosis not present

## 2020-06-12 DIAGNOSIS — J9601 Acute respiratory failure with hypoxia: Secondary | ICD-10-CM | POA: Diagnosis not present

## 2020-06-12 DIAGNOSIS — T8452XD Infection and inflammatory reaction due to internal left hip prosthesis, subsequent encounter: Secondary | ICD-10-CM | POA: Diagnosis not present

## 2020-06-12 DIAGNOSIS — N183 Chronic kidney disease, stage 3 unspecified: Secondary | ICD-10-CM | POA: Diagnosis not present

## 2020-06-12 DIAGNOSIS — R339 Retention of urine, unspecified: Secondary | ICD-10-CM | POA: Diagnosis not present

## 2020-06-12 DIAGNOSIS — R262 Difficulty in walking, not elsewhere classified: Secondary | ICD-10-CM | POA: Diagnosis not present

## 2020-06-12 DIAGNOSIS — T80219A Unspecified infection due to central venous catheter, initial encounter: Secondary | ICD-10-CM | POA: Diagnosis not present

## 2020-06-12 DIAGNOSIS — I808 Phlebitis and thrombophlebitis of other sites: Secondary | ICD-10-CM | POA: Diagnosis not present

## 2020-06-12 DIAGNOSIS — R6520 Severe sepsis without septic shock: Secondary | ICD-10-CM | POA: Diagnosis not present

## 2020-06-12 DIAGNOSIS — R531 Weakness: Secondary | ICD-10-CM | POA: Diagnosis not present

## 2020-06-12 DIAGNOSIS — T50905A Adverse effect of unspecified drugs, medicaments and biological substances, initial encounter: Secondary | ICD-10-CM | POA: Diagnosis not present

## 2020-06-12 DIAGNOSIS — M6281 Muscle weakness (generalized): Secondary | ICD-10-CM | POA: Diagnosis not present

## 2020-06-12 DIAGNOSIS — Z741 Need for assistance with personal care: Secondary | ICD-10-CM | POA: Diagnosis not present

## 2020-06-12 DIAGNOSIS — M1990 Unspecified osteoarthritis, unspecified site: Secondary | ICD-10-CM | POA: Diagnosis not present

## 2020-06-12 DIAGNOSIS — Z743 Need for continuous supervision: Secondary | ICD-10-CM | POA: Diagnosis not present

## 2020-06-12 DIAGNOSIS — R279 Unspecified lack of coordination: Secondary | ICD-10-CM | POA: Diagnosis not present

## 2020-06-12 DIAGNOSIS — T8459XD Infection and inflammatory reaction due to other internal joint prosthesis, subsequent encounter: Secondary | ICD-10-CM | POA: Diagnosis not present

## 2020-06-12 DIAGNOSIS — R41841 Cognitive communication deficit: Secondary | ICD-10-CM | POA: Diagnosis not present

## 2020-06-12 DIAGNOSIS — N400 Enlarged prostate without lower urinary tract symptoms: Secondary | ICD-10-CM | POA: Diagnosis not present

## 2020-06-12 DIAGNOSIS — J189 Pneumonia, unspecified organism: Secondary | ICD-10-CM | POA: Diagnosis not present

## 2020-06-12 DIAGNOSIS — M71152 Other infective bursitis, left hip: Secondary | ICD-10-CM | POA: Diagnosis not present

## 2020-06-12 LAB — GLUCOSE, CAPILLARY
Glucose-Capillary: 92 mg/dL (ref 70–99)
Glucose-Capillary: 98 mg/dL (ref 70–99)
Glucose-Capillary: 146 mg/dL — ABNORMAL HIGH (ref 70–99)

## 2020-06-12 MED ORDER — PREDNISONE 20 MG PO TABS
60.0000 mg | ORAL_TABLET | Freq: Every day | ORAL | Status: DC
Start: 2020-06-14 — End: 2020-08-21

## 2020-06-12 MED ORDER — IPRATROPIUM-ALBUTEROL 0.5-2.5 (3) MG/3ML IN SOLN: 3.0000 mL | RESPIRATORY_TRACT | Status: DC | PRN

## 2020-06-12 MED ORDER — CEFDINIR 300 MG PO CAPS: 300.0000 mg | ORAL_CAPSULE | Freq: Two times a day (BID) | ORAL | 5 refills | Status: DC

## 2020-06-12 MED ORDER — DEXTROMETHORPHAN POLISTIREX ER 30 MG/5ML PO SUER: 30.0000 mg | Freq: Every evening | ORAL | 0 refills | Status: DC | PRN

## 2020-06-12 MED ORDER — DOXYCYCLINE HYCLATE 100 MG PO TABS: 100.0000 mg | ORAL_TABLET | Freq: Two times a day (BID) | ORAL | 5 refills | Status: DC

## 2020-06-12 MED ORDER — BENZONATATE 200 MG PO CAPS: 200.0000 mg | ORAL_CAPSULE | Freq: Three times a day (TID) | ORAL | 0 refills | Status: DC | PRN

## 2020-06-12 NOTE — Plan of Care (Signed)
  Problem: Education: Goal: Knowledge of General Education information will improve Description: Including pain rating scale, medication(s)/side effects and non-pharmacologic comfort measures Outcome: Completed/Met   Problem: Health Behavior/Discharge Planning: Goal: Ability to manage health-related needs will improve Outcome: Completed/Met   Problem: Clinical Measurements: Goal: Ability to maintain clinical measurements within normal limits will improve Outcome: Completed/Met Goal: Will remain free from infection Outcome: Completed/Met Goal: Diagnostic test results will improve Outcome: Completed/Met Goal: Respiratory complications will improve Outcome: Completed/Met Goal: Cardiovascular complication will be avoided Outcome: Completed/Met   Problem: Activity: Goal: Risk for activity intolerance will decrease Outcome: Completed/Met   Problem: Coping: Goal: Level of anxiety will decrease Outcome: Completed/Met   Problem: Elimination: Goal: Will not experience complications related to bowel motility Outcome: Completed/Met   Problem: Pain Managment: Goal: General experience of comfort will improve Outcome: Completed/Met   Problem: Safety: Goal: Ability to remain free from injury will improve Outcome: Completed/Met   Problem: Skin Integrity: Goal: Risk for impaired skin integrity will decrease Outcome: Completed/Met   Problem: Activity: Goal: Ability to tolerate increased activity will improve Outcome: Completed/Met   Problem: Clinical Measurements: Goal: Ability to maintain a body temperature in the normal range will improve Outcome: Completed/Met   Problem: Respiratory: Goal: Ability to maintain adequate ventilation will improve Outcome: Completed/Met Goal: Ability to maintain a clear airway will improve Outcome: Completed/Met   Problem: Safety: Goal: Non-violent Restraint(s) Outcome: Completed/Met

## 2020-06-12 NOTE — Plan of Care (Signed)
  Problem: Education: Goal: Knowledge of General Education information will improve Description: Including pain rating scale, medication(s)/side effects and non-pharmacologic comfort measures Outcome: Progressing   Problem: Health Behavior/Discharge Planning: Goal: Ability to manage health-related needs will improve Outcome: Progressing   Problem: Clinical Measurements: Goal: Ability to maintain clinical measurements within normal limits will improve Outcome: Progressing Goal: Will remain free from infection Outcome: Progressing Goal: Diagnostic test results will improve Outcome: Progressing Goal: Respiratory complications will improve Outcome: Progressing Goal: Cardiovascular complication will be avoided Outcome: Progressing   Problem: Activity: Goal: Risk for activity intolerance will decrease Outcome: Progressing   Problem: Coping: Goal: Level of anxiety will decrease Outcome: Progressing   Problem: Elimination: Goal: Will not experience complications related to bowel motility Outcome: Progressing   Problem: Pain Managment: Goal: General experience of comfort will improve Outcome: Progressing   Problem: Safety: Goal: Ability to remain free from injury will improve Outcome: Progressing   Problem: Skin Integrity: Goal: Risk for impaired skin integrity will decrease Outcome: Progressing   Problem: Activity: Goal: Ability to tolerate increased activity will improve Outcome: Progressing   Problem: Clinical Measurements: Goal: Ability to maintain a body temperature in the normal range will improve Outcome: Progressing   Problem: Respiratory: Goal: Ability to maintain adequate ventilation will improve Outcome: Progressing Goal: Ability to maintain a clear airway will improve Outcome: Progressing   Problem: Safety: Goal: Non-violent Restraint(s) Outcome: Progressing

## 2020-06-12 NOTE — Discharge Summary (Signed)
Physician Discharge Summary  Jacob Mitchell [id number] DOB: 1945-05-19 DOA: 05/31/2020  PCP: [name], NP  Admit date: 05/31/2020 Discharge date: 06/12/2020  Admitted From: Home Disposition:  SNF  Discharge Condition:Stable CODE STATUS:FULL Diet recommendation: Heart Healthy  Brief/Interim Summary:  Patient is a 75 year old male with history of multiple orthopedic surgeries including left hip replacement, recurrent seromas, left hip prosthetic joint infection on IV antibiotics daptomycin and ceftriaxone through PICC line BPH who presented to the emergency department with complaints of left upper extremity swelling and pain around the PICC line site, shortness of breath and cough.  CT imaging done on presentation showed bilateral patchy groundglass opacity consistent with multilobar pneumonia.  CT was negative for PE.  On 06/09/2020 he developed worsening respiratory failure requiring BiPAP and was transferred to ICU.  He required high flow oxygen for maintenance of saturation.  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  ID/orthopedics, pulmonology were consulted and following.  Currently respiratory status has improved.  Antibiotics changed to oral.  Antibiotics will be continued long-term and he will follow-up with ID as an outpatient. Plan is to continue slow tapering of prednisone and follow-up with pulmonology as an outpatient.  PT/OT recommended skilled nursing facility on discharge.  Patient is medically stable for discharge to skilled nursing facility .  Following problems were addressed during his hospitalization:   Eosinophilic pneumonia/acute hypoxic respiratory failure: Presented with shortness of breath, cough.  Developed acute respiratory failure with hypoxia and had to be moved to ICU, required BiPAP.  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  PCCM was following. Responded to steroids.  IV steroids changed to oral.   Pulmonology recommended slow tapering of steroid: Taper 10 mg every 5 days.  Follow-up with pulmonology as an outpatient in 2 weeks. Currently still requiring some oxygen but respiratory status significantly improved.  Continue antitussives, cough syrup.  He completed 7 days of therapy for possible pneumonia because CT imaging showed bilateral patchy groundglass opacity consistent with multifocal pneumonia on presentation.  Left hip prosthetic joint infection/recurrent left hip seroma: He was on daptomycin and ceftriaxone via left upper extremity PICC line.  PICC line has been removed on 4/22.  IV antibiotics discontinued.  Currently on doxycycline and cefdinir.  Plan for continuation for long-term.  He will follow-up with ID as an outpatient  Left upper extremity thrombophlebitis: Most likely through the PICC line.  IV antibiotics discontinued.  Edema of left upper extremity improving  Deconditioning/debility: PT/OT recommended skilled nursing facility on discharge.  TOC following  Moderate obesity: BMI 37.1     Discharge Diagnoses:  Principal Problem:   Sepsis ([place]) Active Problems:   S/P left THA, AA   S/P revision of total hip   Obese   HCAP (healthcare-associated pneumonia)   Cellulitis   Acute respiratory failure with hypoxia ([place])   PICC line infection, initial encounter   Pulmonary eosinophilia (HCC)   Drug-induced lung disease   Prosthetic hip infection (HCC)   Low O2 saturation   Severe comorbid illness    Discharge Instructions  Discharge Instructions    Diet - low sodium heart healthy   Complete by: As directed    Discharge instructions   Complete by: As directed    1)Please take prescribed medications as instructed.  You  should be on antibiotics for long-term 2)You have an appointment on 06/28/2020 at 1:45 PM with Dr. Tommy Medal 3)Do a CBC and BMP test in a week 4)Follow up with Dr. Elsworth Soho,  pulmonology, in 2 weeks.  Name and number of the provider has been  attached.You will be called for appointment   Increase activity slowly   Complete by: As directed    No wound care   Complete by: As directed      Allergies as of 06/12/2020      Reactions   Aspartame And Phenylalanine Palpitations   Artificial sweetener- hrt 140  went to ED   Daptomycin Other (See Comments)   Presumed Eosinophilic pneumonia      Medication List    STOP taking these medications   Aspirin Low Dose 81 MG chewable tablet Generic drug: aspirin   cefTRIAXone  IVPB Commonly known as: ROCEPHIN   celecoxib 200 MG capsule Commonly known as: CELEBREX   daptomycin  IVPB Commonly known as: CUBICIN   FeroSul 325 (65 FE) MG tablet Generic drug: ferrous sulfate   fluticasone 50 MCG/ACT nasal spray Commonly known as: FLONASE   HYDROcodone-acetaminophen 7.5-325 MG tablet Commonly known as: NORCO   methocarbamol 500 MG tablet Commonly known as: ROBAXIN   polyethylene glycol 17 g packet Commonly known as: MIRALAX / GLYCOLAX   polyethylene glycol powder 17 GM/SCOOP powder Commonly known as: GLYCOLAX/MIRALAX     TAKE these medications   benzonatate 200 MG capsule Commonly known as: TESSALON Take 1 capsule (200 mg total) by mouth 3 (three) times daily as needed for cough.   cefdinir 300 MG capsule Commonly known as: OMNICEF Take 1 capsule (300 mg total) by mouth every 12 (twelve) hours.   cetirizine 10 MG tablet Commonly known as: ZYRTEC Take 10 mg by mouth daily.   dextromethorphan 30 MG/5ML liquid Commonly known as: DELSYM Take 5 mLs (30 mg total) by mouth at bedtime as needed for cough.   doxycycline 100 MG tablet Commonly known as: VIBRA-TABS Take 1 tablet (100 mg total) by mouth every 12 (twelve) hours.   ipratropium-albuterol 0.5-2.5 (3) MG/3ML Soln Commonly known as: DUONEB Take 3 mLs by nebulization every 2 (two) hours as needed.   predniSONE 20 MG tablet Commonly known as: DELTASONE Take 3 tablets (60 mg total) by mouth daily with  breakfast. Start taking on: Jun 14, 2020   traZODone 100 MG tablet Commonly known as: DESYREL Take 1 tablet (100 mg total) by mouth at bedtime as needed. What changed: when to take this       Follow-up Information    Rigoberto Noel, MD. Schedule an appointment as soon as possible for a visit in 2 week(s).   Specialty: Pulmonary Disease Contact information: [address] [place] [address] [phone]              Allergies  Allergen Reactions  . Aspartame And Phenylalanine Palpitations    Artificial sweetener- hrt 140  went to ED  . Daptomycin Other (See Comments)    Presumed Eosinophilic pneumonia    Consultations:  ID, pulmonology   Procedures/Studies: CT CHEST WO CONTRAST  Result Date: 06/03/2020 CLINICAL DATA:  Follow-up pneumonia. EXAM: CT CHEST WITHOUT CONTRAST TECHNIQUE: Multidetector CT imaging of the chest was performed following the standard protocol without IV contrast. COMPARISON:  05/31/2020 FINDINGS: Cardiovascular: Heart size appears within normal limits. No pericardial effusion. Aortic atherosclerosis. Coronary artery calcifications. Mediastinum/Nodes: Prominent mediastinal lymph nodes are identified, none of which meet CT criteria for adenopathy. This likely reflects reactive change. Thyroid gland, trachea, and esophagus demonstrate no significant findings. Lungs/Pleura: Trace pleural fluid identified overlying the posterior lung bases. Bilateral multifocal airspace and ground-glass  opacities are again noted identified compatible with multifocal pneumonia. This is most severe within the posterolateral right lower lobe, anterolateral left lower lobe and periphery of the right upper lobe. Compared with the previous exam there is been progressive ground-glass opacification bilaterally. Upper Abdomen: Bilateral kidney cysts are noted. Incompletely characterized without IV contrast. Low-attenuation nodule in the right adrenal gland measures -4  Hounsfield units and is compatible with a benign adenoma. Musculoskeletal: No chest wall mass or suspicious bone lesions identified. Thoracic degenerative disc disease is noted. IMPRESSION: 1. Persistent bilateral multifocal airspace and ground-glass opacities compatible with multifocal pneumonia. Compared with the previous exam there has been progressive ground-glass opacification bilaterally. 2. No evidence for clinically significant pleural effusion or pulmonary abscess. 3. Aortic atherosclerosis. Coronary artery calcifications noted. 4. Right adrenal gland adenoma. Aortic Atherosclerosis (ICD10-I70.0). Electronically Signed   By: Kerby Moors M.D.   On: 06/03/2020 10:27   CT Angio Chest PE W/Cm &/Or Wo Cm  Result Date: 05/31/2020 CLINICAL DATA:  75 year old male with left upper extremity swelling. Concern for pulmonary embolism. EXAM: CT ANGIOGRAPHY CHEST WITH CONTRAST TECHNIQUE: Multidetector CT imaging of the chest was performed using the standard protocol during bolus administration of intravenous contrast. Multiplanar CT image reconstructions and MIPs were obtained to evaluate the vascular anatomy. CONTRAST:  143mL OMNIPAQUE IOHEXOL 350 MG/ML SOLN COMPARISON:  None. FINDINGS: Evaluation of this exam is limited due to respiratory motion artifact. Cardiovascular: Mild cardiomegaly. No pericardial effusion. There is coronary vascular calcification. The thoracic aorta is unremarkable. The origins of the great vessels of the aortic arch appear patent. Evaluation of the pulmonary arteries is very limited due to respiratory motion artifact and suboptimal visualization of the peripheral branches. No large central pulmonary artery embolus identified. Mediastinum/Nodes: Mildly enlarged right hilar lymph node measures 13 mm in short axis. The esophagus is grossly unremarkable. No mediastinal fluid collection. Lungs/Pleura: Small bilateral pleural effusions. There is mild diffuse interstitial and interlobular  septal thickening, likely mild edema. Bilateral patchy ground-glass opacity most concerning for multilobar pneumonia. Clinical correlation and follow-up to resolution recommended. No pneumothorax. The central airways are patent. Upper Abdomen: Partially visualized bilateral renal cysts measure up to 7.5 cm on the left. Musculoskeletal: Degenerative changes of the spine. No acute osseous pathology. Review of the MIP images confirms the above findings. IMPRESSION: 1. No CT evidence of central pulmonary artery embolus. 2. Mild cardiomegaly and small bilateral pleural effusions may represent mild CHF. 3. Bilateral patchy ground-glass opacity most concerning for multilobar pneumonia. Clinical correlation and follow-up to resolution recommended. 4. Mildly enlarged right hilar lymph node, likely reactive. 5. Aortic Atherosclerosis (ICD10-I70.0). Electronically Signed   By: Anner Crete M.D.   On: 05/31/2020 21:17   DG Chest Port 1 View  Result Date: 06/10/2020 CLINICAL DATA:  Acute respiratory failure EXAM: PORTABLE CHEST 1 VIEW COMPARISON:  Radiograph 06/07/2020, CT 06/03/2020 FINDINGS: Some slight interval increase in the extent of peripheral and basilar predominant mixed patchy and interstitial opacities throughout both lungs with some associated right fissural thickening and layering effusions. No pneumothorax. Stable cardiomediastinal contours. No acute osseous or soft tissue abnormality. Degenerative changes are present in the imaged spine and shoulders. Telemetry leads and nasal cannula overlie the chest. IMPRESSION: Slight interval increase in the extent of peripheral and basilar predominant opacities throughout both lungs. May be accentuated by diminished lung volumes versus worsening airspace disease or edema. Electronically Signed   By: Lovena Le M.D.   On: 06/10/2020 06:42   DG CHEST PORT 1 VIEW  Result Date: 06/07/2020 CLINICAL DATA:  Acute respiratory failure with hypoxia EXAM: PORTABLE CHEST 1  VIEW COMPARISON:  06/02/2020 FINDINGS: Improvement in vascular congestion. Improvement in perihilar airspace disease bilaterally. Bibasilar atelectasis/infiltrate is unchanged. Possible small effusions. IMPRESSION: Improvement in vascular congestion and probable mild edema. Persistent bibasilar atelectasis/infiltrate and small effusions. Electronically Signed   By: Franchot Gallo M.D.   On: 06/07/2020 11:54   DG CHEST PORT 1 VIEW  Result Date: 06/02/2020 CLINICAL DATA:  Coughing, low accident saturation. EXAM: PORTABLE CHEST 1 VIEW COMPARISON:  Chest radiograph dated 05/14/2016, CT chest dated 05/31/2020. FINDINGS: The heart is enlarged. Moderate patchy bilateral interstitial and airspace opacities, right greater than left, are noted. Small bilateral pleural effusions are present. No pneumothorax. Degenerative changes are seen in the spine and shoulders. IMPRESSION: 1. Moderate patchy bilateral interstitial and airspace opacities, right greater than left, suspicious for multifocal pneumonia and/or pulmonary edema. 2. Small bilateral pleural effusions. Electronically Signed   By: Zerita Boers M.D.   On: 06/02/2020 20:26   ECHOCARDIOGRAM COMPLETE  Result Date: 06/03/2020    ECHOCARDIOGRAM REPORT   Patient Name:   Tayten SKYLAR PRIEST Date of Exam: 06/03/2020 Medical Rec #:  [id number]    Height:       79.0 in Accession #:    [id number]   Weight:       329.4 lb Date of Birth:  1945/09/12    BSA:          2.817 m Patient Age:    8 years     BP:           123/64 mmHg Patient Gender: M            HR:           73 bpm. Exam Location:  Inpatient Procedure: 2D Echo, Cardiac Doppler, Color Doppler and Intracardiac            Opacification Agent Indications:    [place] respiratory distress R06.03  History:        Patient has no prior history of Echocardiogram examinations.                 Sepsis. Acute hypoxemic respiratory failure.  Sonographer:    Darlina Sicilian RDCS Referring Phys: [phone] [facility]  Sonographer  Comments: Technically difficult study due to poor echo windows. On BiPap IMPRESSIONS  1. Technically difficult study, limited views even with contrast. Left ventricular ejection fraction, by estimation, is 50 to 55%. The left ventricle has grossly low normal function. Left ventricular endocardial border not optimally defined to evaluate regional wall motion. There is mild left ventricular hypertrophy. Left ventricular diastolic parameters are consistent with Grade I diastolic dysfunction (impaired relaxation).  2. Right ventricular systolic function is normal. The right ventricular size is mildly enlarged. Tricuspid regurgitation signal is inadequate for assessing PA pressure.  3. The mitral valve is grossly normal. No evidence of mitral valve regurgitation. No evidence of mitral stenosis.  4. The aortic valve was not well visualized. Aortic valve regurgitation is not visualized. No aortic stenosis is present.  5. Aortic dilatation noted. Aneurysm of the ascending aorta, measuring 45 mm. There is mild dilatation of the aortic root, measuring 41 mm. FINDINGS  Left Ventricle: Left ventricular ejection fraction, by estimation, is 50 to 55%. The left ventricle has low normal function. Left ventricular endocardial border not optimally defined to evaluate regional wall motion. Definity contrast agent was given IV  to delineate the left ventricular endocardial borders. The left  ventricular internal cavity size was normal in size. There is mild left ventricular hypertrophy. Left ventricular diastolic parameters are consistent with Grade I diastolic dysfunction (impaired relaxation). Right Ventricle: The right ventricular size is mildly enlarged. No increase in right ventricular wall thickness. Right ventricular systolic function is normal. Tricuspid regurgitation signal is inadequate for assessing PA pressure. Left Atrium: Left atrial size was normal in size. Right Atrium: Right atrial size was normal in size. Pericardium:  Trivial pericardial effusion is present. Mitral Valve: The mitral valve is grossly normal. No evidence of mitral valve regurgitation. No evidence of mitral valve stenosis. Tricuspid Valve: The tricuspid valve is normal in structure. Tricuspid valve regurgitation is not demonstrated. Aortic Valve: The aortic valve was not well visualized. Aortic valve regurgitation is not visualized. No aortic stenosis is present. Pulmonic Valve: The pulmonic valve was not well visualized. Pulmonic valve regurgitation is not visualized. Aorta: Aortic dilatation noted. There is mild dilatation of the aortic root, measuring 41 mm. There is an aneurysm involving the ascending aorta measuring 45 mm. IAS/Shunts: The interatrial septum was not well visualized.  LEFT VENTRICLE PLAX 2D LVIDd:         5.50 cm  Diastology LVIDs:         4.00 cm  LV e' medial:    6.53 cm/s LV PW:         1.30 cm  LV E/e' medial:  7.7 LV IVS:        1.30 cm  LV e' lateral:   12.90 cm/s LVOT diam:     2.50 cm  LV E/e' lateral: 3.9 LVOT Area:     4.91 cm  RIGHT VENTRICLE RV S prime:     27.10 cm/s TAPSE (M-mode): 2.3 cm LEFT ATRIUM             Index       RIGHT ATRIUM           Index LA diam:        4.00 cm 1.42 cm/m  RA Area:     17.40 cm LA Vol (A2C):   69.7 ml 24.74 ml/m RA Volume:   38.80 ml  13.77 ml/m LA Vol (A4C):   64.5 ml 22.90 ml/m LA Biplane Vol: 68.5 ml 24.32 ml/m   AORTA Ao Root diam: 4.10 cm Ao Asc diam:  4.50 cm MITRAL VALVE MV Area (PHT): 1.72 cm    SHUNTS MV Decel Time: 440 msec    Systemic Diam: 2.50 cm MV E velocity: 50.60 cm/s MV A velocity: 83.60 cm/s MV E/A ratio:  0.61 Oswaldo Milian MD Electronically signed by Oswaldo Milian MD Signature Date/Time: 06/03/2020/12:57:36 PM    Final    UE VENOUS DUPLEX ([place] [facility] 7 am - 7 pm)  Result Date: 05/31/2020 UPPER VENOUS STUDY  Indications: Swelling, and Edema Comparison Study: no prior Performing Technologist: Abram Sander RVS  Examination Guidelines: A complete evaluation  includes B-mode imaging, spectral Doppler, color Doppler, and power Doppler as needed of all accessible portions of each vessel. Bilateral testing is considered an integral part of a complete examination. Limited examinations for reoccurring indications may be performed as noted.  Left Findings: +----------+------------+---------+-----------+----------+-------+ LEFT      CompressiblePhasicitySpontaneousPropertiesSummary +----------+------------+---------+-----------+----------+-------+ IJV           Full       Yes       Yes                      +----------+------------+---------+-----------+----------+-------+  Subclavian    Full       Yes       Yes                      +----------+------------+---------+-----------+----------+-------+ Axillary      Full       Yes       Yes                      +----------+------------+---------+-----------+----------+-------+ Brachial      Full       Yes       Yes                      +----------+------------+---------+-----------+----------+-------+ Radial        Full                                          +----------+------------+---------+-----------+----------+-------+ Ulnar         Full                                          +----------+------------+---------+-----------+----------+-------+ Cephalic      Full                                          +----------+------------+---------+-----------+----------+-------+ Basilic       Full                                          +----------+------------+---------+-----------+----------+-------+  Summary:  Left: No evidence of deep vein thrombosis in the upper extremity. No evidence of superficial vein thrombosis in the upper extremity. No evidence of thrombosis in the subclavian.  *See table(s) above for measurements and observations.  Diagnosing physician: Harold Barban MD Electronically signed by Harold Barban MD on 05/31/2020 at 9:29:04 PM.    Final         Subjective: Patient seen and examined at the bedside this morning.  Hemodynamically stable for discharge.  Discharge Exam: Vitals:   06/11/20 2043 06/12/20 0528  BP: (!) 132/58 130/71  Pulse: 63 60  Resp:    Temp: 98.7 F (37.1 C) 97.9 F (36.6 C)  SpO2: 96% 98%   Vitals:   06/11/20 0449 06/11/20 1239 06/11/20 2043 06/12/20 0528  BP: 138/70 (!) 136/52 (!) 132/58 130/71  Pulse: (!) 54 (!) 58 63 60  Resp: 18 18    Temp: (!) 97.4 F (36.3 C) 98 F (36.7 C) 98.7 F (37.1 C) 97.9 F (36.6 C)  TempSrc: Oral Oral Oral Oral  SpO2: (!) 89% 93% 96% 98%  Weight:      Height:        General: Pt is alert, awake, not in acute distress Cardiovascular: RRR, S1/S2 +, no rubs, no gallops Respiratory: CTA bilaterally, no wheezing, no rhonchi Abdominal: Soft, NT, ND, bowel sounds + Extremities: Mild edema of the left upper extremity, no cyanosis    The results of significant diagnostics from this hospitalization (including imaging, microbiology, ancillary and laboratory) are listed below for reference.     Microbiology: Recent  Results (from the past 240 hour(s))  MRSA PCR Screening     Status: None   Collection Time: 06/02/20  9:59 PM   Specimen: Nasal Mucosa; Nasopharyngeal  Result Value Ref Range Status   MRSA by PCR NEGATIVE NEGATIVE Final    Comment:        The GeneXpert MRSA Assay (FDA approved for NASAL specimens only), is one component of a comprehensive MRSA colonization surveillance program. It is not intended to diagnose MRSA infection nor to guide or monitor treatment for MRSA infections. Performed at [facility], [place] [address] [place], [place] [address]      Labs: BNP (last 3 results) Recent Labs    05/31/20 2026  BNP 322.0*   Basic Metabolic Panel: Recent Labs  Lab 06/06/20 0711 06/07/20 0301 06/08/20 0258 06/11/20 0603  NA 136 138 138 140  K 4.5 4.4 4.9 4.6  CL 108 110 109 106  CO2 18* 20* 21* 27  GLUCOSE 127*  124* 139* 105*  BUN 60* 59* 52* 41*  CREATININE 1.07 1.09 1.00 0.96  CALCIUM 7.9* 8.0* 7.8* 8.0*  MG  --   --   --  2.2  PHOS  --   --   --  3.2   Liver Function Tests: Recent Labs  Lab 06/11/20 0603  ALBUMIN 2.5*   No results for input(s): LIPASE, AMYLASE in the last 168 hours. No results for input(s): AMMONIA in the last 168 hours. CBC: Recent Labs  Lab 06/06/20 0930 06/07/20 0301 06/08/20 0258 06/11/20 0603  WBC 15.0* 14.5* 13.9* 11.0*  NEUTROABS  --   --   --  8.2*  HGB 8.9* 8.4* 8.7* 9.0*  HCT 28.9* 28.3* 30.0* 31.1*  MCV 79.2* 82.5 82.0 82.5  PLT 213 250 251 264   Cardiac Enzymes: No results for input(s): CKTOTAL, CKMB, CKMBINDEX, TROPONINI in the last 168 hours. BNP: Invalid input(s): POCBNP CBG: Recent Labs  Lab 06/11/20 0726 06/11/20 1149 06/11/20 1547 06/11/20 2044 06/12/20 0718  GLUCAP 91 121* 156* 102* 92   D-Dimer No results for input(s): DDIMER in the last 72 hours. Hgb A1c No results for input(s): HGBA1C in the last 72 hours. Lipid Profile No results for input(s): CHOL, HDL, LDLCALC, TRIG, CHOLHDL, LDLDIRECT in the last 72 hours. Thyroid function studies No results for input(s): TSH, T4TOTAL, T3FREE, THYROIDAB in the last 72 hours.  Invalid input(s): FREET3 Anemia work up No results for input(s): VITAMINB12, FOLATE, FERRITIN, TIBC, IRON, RETICCTPCT in the last 72 hours. Urinalysis    Component Value Date/Time   COLORURINE YELLOW 05/31/2020 2030   APPEARANCEUR CLEAR 05/31/2020 2030   LABSPEC 1.027 05/31/2020 2030   [place] 5.0 05/31/2020 2030   GLUCOSEU NEGATIVE 05/31/2020 2030   [place] NEGATIVE 05/31/2020 2030   [place] NEGATIVE 05/31/2020 2030   [place] 05/31/2020 2030   PROTEINUR 30 (A) 05/31/2020 2030   NITRITE NEGATIVE 05/31/2020 2030   LEUKOCYTESUR NEGATIVE 05/31/2020 2030   Sepsis Labs Invalid input(s): PROCALCITONIN,  WBC,  LACTICIDVEN Microbiology Recent Results (from the past 240 hour(s))  MRSA PCR  Screening     Status: None   Collection Time: 06/02/20  9:59 PM   Specimen: Nasal Mucosa; Nasopharyngeal  Result Value Ref Range Status   MRSA by PCR NEGATIVE NEGATIVE Final    Comment:        The GeneXpert MRSA Assay (FDA approved for NASAL specimens only), is one component of a comprehensive MRSA colonization surveillance program. It is not intended to diagnose MRSA  infection nor to guide or monitor treatment for MRSA infections. Performed at [facility], [place] [address] [place],  [address]     Please note: You were cared for by a hospitalist during your hospital stay. Once you are discharged, your primary care physician will handle any further medical issues. Please note that NO REFILLS for any discharge medications will be authorized once you are discharged, as it is imperative that you return to your primary care physician (or establish a relationship with a primary care physician if you do not have one) for your post hospital discharge needs so that they can reassess your need for medications and monitor your lab values.    Time coordinating discharge: 40 minutes  SIGNED:   Shelly Coss, MD  [facility] 06/12/2020, 11:31 AM Pager [phone]  If 7PM-7AM, please contact night-coverage [email or web address] Password [email or web address]

## 2020-06-12 NOTE — Progress Notes (Signed)
Subjective:  Patient happy to be off of oxygen  Antibiotics:  Anti-infectives (From admission, onward)   Start     Dose/Rate Route Frequency Ordered Stop   06/12/20 0000  cefdinir (OMNICEF) 300 MG capsule        300 mg Oral Every 12 hours 06/12/20 1131     06/12/20 0000  doxycycline (VIBRA-TABS) 100 MG tablet        100 mg Oral Every 12 hours 06/12/20 1131     06/07/20 1000  cefdinir (OMNICEF) capsule 300 mg        300 mg Oral Every 12 hours 06/06/20 1002     06/07/20 1000  doxycycline (VIBRA-TABS) tablet 100 mg        100 mg Oral Every 12 hours 06/06/20 1002     06/03/20 1800  vancomycin (VANCOREADY) IVPB 1000 mg/200 mL        1,000 mg 200 mL/hr over 60 Minutes Intravenous Every 12 hours 06/03/20 0838 06/06/20 1945   06/03/20 1030  azithromycin (ZITHROMAX) 500 mg in sodium chloride 0.9 % 250 mL IVPB  Status:  Discontinued        500 mg 250 mL/hr over 60 Minutes Intravenous Every 24 hours 06/03/20 0931 06/06/20 0758   06/03/20 1000  azithromycin (ZITHROMAX) tablet 250 mg  Status:  Discontinued       "Followed by" Linked Group Details   250 mg Oral Daily 06/02/20 0942 06/03/20 0931   06/02/20 1030  azithromycin (ZITHROMAX) tablet 500 mg       "Followed by" Linked Group Details   500 mg Oral Daily 06/02/20 0942 06/02/20 1042   06/02/20 0000  vancomycin (VANCOREADY) IVPB 1500 mg/300 mL  Status:  Discontinued        1,500 mg 150 mL/hr over 120 Minutes Intravenous Every 24 hours 06/01/20 0129 06/03/20 0838   06/01/20 0800  ceFEPIme (MAXIPIME) 2 g in sodium chloride 0.9 % 100 mL IVPB        2 g 200 mL/hr over 30 Minutes Intravenous Every 8 hours 06/01/20 0129 06/06/20 1837   06/01/20 0400  vancomycin (VANCOREADY) IVPB 1000 mg/200 mL  Status:  Discontinued        1,000 mg 200 mL/hr over 60 Minutes Intravenous  Once 06/01/20 0300 06/01/20 0307   06/01/20 0400  ceFEPIme (MAXIPIME) 2 g in sodium chloride 0.9 % 100 mL IVPB  Status:  Discontinued        2 g 200 mL/hr over 30  Minutes Intravenous  Once 06/01/20 0300 06/01/20 0307   05/31/20 2245  vancomycin (VANCOCIN) IVPB 1000 mg/200 mL premix  Status:  Discontinued        1,000 mg 200 mL/hr over 60 Minutes Intravenous  Once 05/31/20 2232 05/31/20 2235   05/31/20 2245  ceFEPIme (MAXIPIME) 2 g in sodium chloride 0.9 % 100 mL IVPB        2 g 200 mL/hr over 30 Minutes Intravenous  Once 05/31/20 2232 06/01/20 0132   05/31/20 2245  vancomycin (VANCOREADY) IVPB 2000 mg/400 mL        2,000 mg 200 mL/hr over 120 Minutes Intravenous  Once 05/31/20 2235 06/01/20 0233      Medications: Scheduled Meds: . benzonatate  200 mg Oral TID  . cefdinir  300 mg Oral Q12H  . Chlorhexidine Gluconate Cloth  6 each Topical Daily  . dextromethorphan  30 mg Oral BID  . doxycycline  100 mg Oral Q12H  . enoxaparin (LOVENOX) injection  75 mg Subcutaneous Q24H  . insulin aspart  3-9 Units Subcutaneous TID WC  . predniSONE  60 mg Oral Q breakfast  . traZODone  100 mg Oral QHS   Continuous Infusions: . sodium chloride 10 mL/hr at 06/07/20 1200   PRN Meds:.sodium chloride, acetaminophen **OR** acetaminophen, HYDROcodone bit-homatropine, ipratropium-albuterol, ondansetron **OR** ondansetron (ZOFRAN) IV, phenol    Objective: Weight change:   Intake/Output Summary (Last 24 hours) at 06/12/2020 1506 Last data filed at 06/12/2020 1233 Gross per 24 hour  Intake 600 ml  Output 1625 ml  Net -1025 ml   Blood pressure 105/64, pulse 70, temperature 98.4 F (36.9 C), resp. rate 19, height 6\' 7"  (2.007 m), weight (!) 149.4 kg, SpO2 92 %. Temp:  [97.9 F (36.6 C)-98.7 F (37.1 C)] 98.4 F (36.9 C) (05/03 1437) Pulse Rate:  [60-70] 70 (05/03 1437) Resp:  [19] 19 (05/03 1437) BP: (105-132)/(58-71) 105/64 (05/03 1437) SpO2:  [92 %-98 %] 92 % (05/03 1437)  Physical Exam: Physical Exam Constitutional:      Appearance: He is well-developed.  HENT:     Head: Normocephalic and atraumatic.  Eyes:     Conjunctiva/sclera: Conjunctivae  normal.  Cardiovascular:     Rate and Rhythm: Regular rhythm. Tachycardia present.     Heart sounds: No murmur heard. No friction rub. No gallop.   Pulmonary:     Effort: No respiratory distress.     Breath sounds: No stridor. No wheezing or rhonchi.  Abdominal:     General: There is no distension.     Palpations: Abdomen is soft.  Musculoskeletal:        General: Normal range of motion.     Cervical back: Normal range of motion and neck supple.  Skin:    General: Skin is warm and dry.     Coloration: Skin is not pale.     Findings: No erythema or rash.  Neurological:     General: No focal deficit present.     Mental Status: He is alert and oriented to person, place, and time.  Psychiatric:        Mood and Affect: Mood normal.        Behavior: Behavior normal.        Thought Content: Thought content normal.        Judgment: Judgment normal.      CBC:    BMET Recent Labs    06/11/20 0603  NA 140  K 4.6  CL 106  CO2 27  GLUCOSE 105*  BUN 41*  CREATININE 0.96  CALCIUM 8.0*     Liver Panel  Recent Labs    06/11/20 0603  ALBUMIN 2.5*       Sedimentation Rate No results for input(s): ESRSEDRATE in the last 72 hours. C-Reactive Protein No results for input(s): CRP in the last 72 hours.  Micro Results: Recent Results (from the past 720 hour(s))  Culture, blood (routine x 2)     Status: None   Collection Time: 05/31/20  7:58 PM   Specimen: BLOOD RIGHT HAND  Result Value Ref Range Status   Specimen Description   Final    BLOOD RIGHT HAND Performed at [facility] [address], [place], [place] [address]    Special Requests   Final    BOTTLES DRAWN AEROBIC AND ANAEROBIC Blood Culture adequate volume Performed at [facility] [address], [place], [place] [address]    Culture   Final    NO  GROWTH 5 DAYS Performed at [facility], [address] [place], [place] [address]    Report Status  06/06/2020 FINAL  Final  Culture, blood (Routine X 2) w Reflex to ID Panel     Status: None   Collection Time: 05/31/20  8:16 PM   Specimen: BLOOD  Result Value Ref Range Status   Specimen Description   Final    BLOOD RIGHT ANTECUBITAL Performed at [facility], [address] [place], [place] [address]    Special Requests   Final    BOTTLES DRAWN AEROBIC AND ANAEROBIC Blood Culture results may not be optimal due to an excessive volume of blood received in culture bottles Performed at [facility], [address], [place], [place] [address]    Culture   Final    NO GROWTH 5 DAYS Performed at [facility], [address] [place], [place] [address]    Report Status 06/06/2020 FINAL  Final  Culture, blood (routine x 2)     Status: None   Collection Time: 05/31/20  8:26 PM   Specimen: BLOOD RIGHT HAND  Result Value Ref Range Status   Specimen Description   Final    BLOOD RIGHT HAND Performed at [facility], [address] [place], [place] [address]    Special Requests   Final    BOTTLES DRAWN AEROBIC ONLY Blood Culture results may not be optimal due to an excessive volume of blood received in culture bottles Performed at [facility], [address] [place], [place] [address]    Culture   Final    NO GROWTH 5 DAYS Performed at [facility], [address] [place], [place] [address]    Report Status 06/06/2020 FINAL  Final  Respiratory (~20 pathogens) panel by PCR     Status: None   Collection Time: 05/31/20 10:02 PM   Specimen: Nasopharyngeal Swab; Respiratory  Result Value Ref Range Status   Adenovirus NOT DETECTED NOT DETECTED Final   Coronavirus 229E NOT DETECTED NOT DETECTED Final    Comment: (NOTE) The Coronavirus on the Respiratory Panel, DOES NOT test for the novel  Coronavirus (2019 nCoV)    Coronavirus HKU1 NOT DETECTED NOT DETECTED Final   Coronavirus NL63 NOT DETECTED NOT  DETECTED Final   Coronavirus OC43 NOT DETECTED NOT DETECTED Final   Metapneumovirus NOT DETECTED NOT DETECTED Final   Rhinovirus / Enterovirus NOT DETECTED NOT DETECTED Final   Influenza A NOT DETECTED NOT DETECTED Final   Influenza B NOT DETECTED NOT DETECTED Final   Parainfluenza Virus 1 NOT DETECTED NOT DETECTED Final   Parainfluenza Virus 2 NOT DETECTED NOT DETECTED Final   Parainfluenza Virus 3 NOT DETECTED NOT DETECTED Final   Parainfluenza Virus 4 NOT DETECTED NOT DETECTED Final   Respiratory Syncytial Virus NOT DETECTED NOT DETECTED Final   Bordetella pertussis NOT DETECTED NOT DETECTED Final   Bordetella Parapertussis NOT DETECTED NOT DETECTED Final   Chlamydophila pneumoniae NOT DETECTED NOT DETECTED Final   Mycoplasma pneumoniae NOT DETECTED NOT DETECTED Final    Comment: Performed at [facility], [address], [place], [place] [address]  Expectorated Sputum Assessment w Gram Stain, Rflx to Resp Cult     Status: None   Collection Time: 06/02/20  9:31 AM   Specimen: Sputum  Result Value Ref Range Status   Specimen Description SPUTUM  Final   Special Requests NONE  Final   Sputum  evaluation   Final    THIS SPECIMEN IS ACCEPTABLE FOR SPUTUM CULTURE Performed at [facility] [address] [place], [place] [address]    Report Status 06/04/2020 FINAL  Final  Culture, Respiratory w Gram Stain     Status: None   Collection Time: 06/02/20  9:31 AM   Specimen: SPU  Result Value Ref Range Status   Specimen Description   Final    SPUTUM Performed at [facility] [address], [place], [place] [address]    Special Requests   Final    NONE Reflexed from [id number] Performed at [facility], [place] [address], [place], [place] [address]    Gram Stain   Final    RARE WBC PRESENT,BOTH PMN AND MONONUCLEAR FEW YEAST FEW GRAM POSITIVE COCCI IN PAIRS RARE GRAM NEGATIVE RODS    Culture   Final    FEW CANDIDA  ALBICANS WITHIN NORMAL RESPIRATORY FLORA Performed at [facility], [place] [address], [place], [place] [address]    Report Status 06/05/2020 FINAL  Final  MRSA PCR Screening     Status: None   Collection Time: 06/02/20  9:59 PM   Specimen: Nasal Mucosa; Nasopharyngeal  Result Value Ref Range Status   MRSA by PCR NEGATIVE NEGATIVE Final    Comment:        The GeneXpert MRSA Assay (FDA approved for NASAL specimens only), is one component of a comprehensive MRSA colonization surveillance program. It is not intended to diagnose MRSA infection nor to guide or monitor treatment for MRSA infections. Performed at [facility], [place] [address], [place], [place] [address]     Studies/Results: No results found.    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 He was changed to vancomycin and cefepime. 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  LEEVON UPPERMAN has an  appointment on 06/28/2020 at 145 PM with Dr. Tommy Medal  The [facility] is located in the [facility] at  [place] in [place].  [address], which is located to the left of the elevators.  Phone: [phone]  Fax: [phone]  [email or web address]  He should arrive 15-30 minutes before his appointment.       LOS: 12 days   Alcide Evener 06/12/2020, 3:06 PM

## 2020-06-12 NOTE — Telephone Encounter (Signed)
Patient is still currently admitted to hospital. Will keep an eye out and call patient to schedule office visit once discharged.

## 2020-06-12 NOTE — TOC Transition Note (Signed)
Transition of Care [facility]) - CM/SW Discharge Note   Patient Details  Name: Jacob Mitchell MRN: [id number] Date of Birth: 01-06-1946  [facility] of [facility] CM/SW Contact:  Lynnell Catalan, RN Phone Number: 06/12/2020, 1:59 PM   Clinical Narrative:    Wife was given list of SNFs with beds available today. [facility] chosen. [facility] liaison contacted to accept bed. [facility] to accept pt today. Pt to be transported via [facility].   He is going to [facility] room 3222. number for report is [phone].   Final next level of care: Skilled Nursing Facility Barriers to Discharge: No Barriers Identified   Patient Goals and CMS Choice Patient states their goals for this hospitalization and ongoing recovery are:: To get better   Choice offered to / list presented to : [facility]  Discharge Placement              Patient chooses bed at: [facility] Patient to be transferred to facility by: [facility] Name of family member notified: Wife [name] Patient and family notified of of transfer: 06/12/20  Discharge Plan and Services                                     Social Determinants of Health (SDOH) Interventions     Readmission Risk Interventions Readmission Risk Prevention Plan 05/03/2020  Post Dischage Appt Complete  Medication Screening Complete  Transportation Screening Complete  Some recent data might be hidden

## 2020-06-13 DIAGNOSIS — E669 Obesity, unspecified: Secondary | ICD-10-CM | POA: Diagnosis not present

## 2020-06-13 DIAGNOSIS — I808 Phlebitis and thrombophlebitis of other sites: Secondary | ICD-10-CM | POA: Diagnosis not present

## 2020-06-13 DIAGNOSIS — J8281 Chronic eosinophilic pneumonia: Secondary | ICD-10-CM | POA: Diagnosis not present

## 2020-06-13 DIAGNOSIS — J189 Pneumonia, unspecified organism: Secondary | ICD-10-CM | POA: Diagnosis not present

## 2020-06-13 DIAGNOSIS — R339 Retention of urine, unspecified: Secondary | ICD-10-CM | POA: Diagnosis not present

## 2020-06-13 DIAGNOSIS — M1990 Unspecified osteoarthritis, unspecified site: Secondary | ICD-10-CM | POA: Diagnosis not present

## 2020-06-13 DIAGNOSIS — N183 Chronic kidney disease, stage 3 unspecified: Secondary | ICD-10-CM | POA: Diagnosis not present

## 2020-06-13 DIAGNOSIS — T8452XD Infection and inflammatory reaction due to internal left hip prosthesis, subsequent encounter: Secondary | ICD-10-CM | POA: Diagnosis not present

## 2020-06-13 DIAGNOSIS — M71152 Other infective bursitis, left hip: Secondary | ICD-10-CM | POA: Diagnosis not present

## 2020-06-13 DIAGNOSIS — N400 Enlarged prostate without lower urinary tract symptoms: Secondary | ICD-10-CM | POA: Diagnosis not present

## 2020-06-15 DIAGNOSIS — T8452XD Infection and inflammatory reaction due to internal left hip prosthesis, subsequent encounter: Secondary | ICD-10-CM | POA: Diagnosis not present

## 2020-06-15 DIAGNOSIS — J189 Pneumonia, unspecified organism: Secondary | ICD-10-CM | POA: Diagnosis not present

## 2020-06-15 DIAGNOSIS — R339 Retention of urine, unspecified: Secondary | ICD-10-CM | POA: Diagnosis not present

## 2020-06-15 DIAGNOSIS — I808 Phlebitis and thrombophlebitis of other sites: Secondary | ICD-10-CM | POA: Diagnosis not present

## 2020-06-15 NOTE — Telephone Encounter (Signed)
Called and spoke with patient, scheduled office visit for Tuesday 06/26/20 at 3pm with Dr Elsworth Soho per patient request who wanted to see Dr Elsworth Soho this visit. Patient aware to come at 2:30pm to get xray prior to visit. Order placed for xray Nothing further needed at this time.

## 2020-06-15 NOTE — Addendum Note (Signed)
Addended by: Merrilee Seashore on: 06/15/2020 01:35 PM   Modules accepted: Orders

## 2020-06-26 ENCOUNTER — Other Ambulatory Visit: Payer: Self-pay

## 2020-06-26 ENCOUNTER — Ambulatory Visit (INDEPENDENT_AMBULATORY_CARE_PROVIDER_SITE_OTHER): Payer: Medicare Other

## 2020-06-26 ENCOUNTER — Ambulatory Visit (INDEPENDENT_AMBULATORY_CARE_PROVIDER_SITE_OTHER): Payer: Medicare Other | Admitting: Pulmonary Disease

## 2020-06-26 ENCOUNTER — Encounter: Payer: Self-pay | Admitting: Pulmonary Disease

## 2020-06-26 DIAGNOSIS — G4700 Insomnia, unspecified: Secondary | ICD-10-CM | POA: Diagnosis not present

## 2020-06-26 DIAGNOSIS — J984 Other disorders of lung: Secondary | ICD-10-CM

## 2020-06-26 DIAGNOSIS — J189 Pneumonia, unspecified organism: Secondary | ICD-10-CM

## 2020-06-26 DIAGNOSIS — T50905A Adverse effect of unspecified drugs, medicaments and biological substances, initial encounter: Secondary | ICD-10-CM

## 2020-06-26 MED ORDER — PREDNISONE 5 MG PO TABS: ORAL_TABLET | ORAL | 0 refills | Status: AC

## 2020-06-26 MED ORDER — TRAZODONE HCL 100 MG PO TABS: 100.0000 mg | ORAL_TABLET | Freq: Every day | ORAL | 0 refills | Status: DC

## 2020-06-26 NOTE — Assessment & Plan Note (Addendum)
Chest x-ray obtained and independently reviewed today which shows resolution of infiltrates. We will continue to taper prednisone gradually to off.  I would rapidly cut him down from 60 mg to 20 mg of prednisone and then do a slow taper by 5 mg every month. We discussed side effects of long-term prednisone and reasons not to immediately discontinue.  Since this episode was clearly triggered by daptomycin I do not expect a recurrence after tapering of steroids  He is planning a trip to [place] with his grandkids and is determined to make this trip

## 2020-06-26 NOTE — Progress Notes (Signed)
Subjective:    Patient ID: Jacob Mitchell, male    DOB: [birth year], 75 y.o.   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  Has a history of left hip prosthesis that has been complicated by multiple revisions due to subluxation and recurrent left hip seroma.  He had a fall in November 2021 with a left trochanteric femur fracture and status post ORIF in December 2021.  In early January 2022 , pre-op COVID-19 test was POS.  Subsequently had a second ORIF in mid January 2022 due to periprosthetic fracture.  Recommend incision and drainage on May 01, 2020.  At this point in time placed on daptomycin and ceftriaxone for 6 weeks upper extremity PICC line.  Admitted on 05/31/2020 [1 month into daptomycin and ceftriaxone treatment] with clinically left PICC line related suspected cellulitis but also cough and shortness of breath [he also had cough even before daptomycin] but the cough is worsened over 2 weeks prior to admission.  Left upper extremity duplex negative for DVT at admission.  CT angiogram chest negative for pulmonary embolus but showed mild cardiomegaly and bilateral small pleural effusions with bilateral patchy groundglass opacities especially in the lower lobe.  Respiratory virus panel negative.  Seen by infectious disease consultation and orthopedics 06/01/2020 and antibiotics changed.  Developed acute hypoxic respiratory failure due to presumptive diagnosis of eosinophilic pneumonia  Interim-he was discharged 5/3, weaned off oxygen completely, [facility]  He went under the impression he was getting physical therapy however he did not get any and eventually got frustrated and left AMA.  He is now getting home physical therapy, accompanied by his wife today, arrives with a walker. He does not have any left hip pain.  Left upper arm is still oozing clean fluid from the area of PICC line.  No coughing, walking 852,000 steps daily, gets winded. He is  still maintained on 60 mg of prednisone that he got on discharge   Significant tests/ events reviewed  05/31/2020 - admit.  91% oxygen on room air  06/01/2020: 91% oxygen on room air.  Dr. Alvan Dame orthopedic consult: Recurrent left hip seroma.  Antibiotic plan per infectious diseases.  If no IV antibiotic planned and recommended doxycycline for suppression for 6 months at least and consider IR drain to the left hip ? Left upper extremity PICC line removed  4/22 ID consut - recopmmend dc pic line due to cellulitis concern and marked upper extremity edema and follow-up blood cultures.   4/23 - speech eval -regular diet, think liquids  4/25 off bipap 10pm  4/26 on high flow nasal cannula plus nonrebreather  4/28 decreased oxygen requirements to high flow nasal cannula   CTA chest 4/21 small bilateral effusions, bilateral patchy groundglass opacity?  Multilobar pneumonia.  CT chest without contrast 4/24 persistent bilateral multifocal and groundglass opacities, no effusions  Echo 4/24 >> nml LVEF , gr 1 DD, asc aorta 25mm   Review of Systems neg for any significant sore throat, dysphagia, itching, sneezing, nasal congestion or excess/ purulent secretions, fever, chills, sweats, unintended wt loss, pleuritic or exertional cp, hempoptysis, orthopnea pnd or change in chronic leg swelling. Also denies presyncope, palpitations, heartburn, abdominal pain, nausea, vomiting, diarrhea or change in bowel or urinary habits, dysuria,hematuria, rash, arthralgias, visual complaints, headache, numbness weakness or ataxia.     Objective:   Physical Exam  Gen. Pleasant, obese, in no distress, normal affect ENT - no pallor,icterus, no post nasal drip, class 2-3  airway Neck: No JVD, no thyromegaly, no carotid bruits Lungs: no use of accessory muscles, no dullness to percussion, decreased without rales or rhonchi  Cardiovascular: Rhythm regular, heart sounds  normal, no murmurs or gallops, no peripheral  edema Abdomen: soft and non-tender, no hepatosplenomegaly, BS normal. Musculoskeletal: No deformities, no cyanosis or clubbing Neuro:  alert, non focal, no tremors        Assessment & Plan:

## 2020-06-26 NOTE — Assessment & Plan Note (Addendum)
Refill will be provided on trazodone until he can get with his PCP

## 2020-06-26 NOTE — Patient Instructions (Signed)
We will taper prednisone to off.  Starting tomorrow, take 2 tablets / 20 mg On 5/24 , decrease to 1 tablet / 20 mg On 5/31, decrease to 15 mg /5 mg tablets x3 On 6/6 , decrease to 10 mg On 6/13, decrease to 5 mg On 6/20, he can stop taking prednisone.  Report to me if breathing gets worse Refill on trazodone for 1 month

## 2020-06-28 ENCOUNTER — Other Ambulatory Visit: Payer: Self-pay

## 2020-06-28 ENCOUNTER — Ambulatory Visit (INDEPENDENT_AMBULATORY_CARE_PROVIDER_SITE_OTHER): Payer: Medicare Other | Admitting: Infectious Disease

## 2020-06-28 ENCOUNTER — Encounter: Payer: Self-pay | Admitting: Infectious Disease

## 2020-06-28 ENCOUNTER — Ambulatory Visit: Payer: Medicare Other | Admitting: Infectious Disease

## 2020-06-28 VITALS — BP 112/66 | HR 81 | Temp 98.4°F | Wt 309.0 lb

## 2020-06-28 DIAGNOSIS — Z96649 Presence of unspecified artificial hip joint: Secondary | ICD-10-CM | POA: Diagnosis not present

## 2020-06-28 DIAGNOSIS — J8281 Chronic eosinophilic pneumonia: Secondary | ICD-10-CM

## 2020-06-28 DIAGNOSIS — T80219A Unspecified infection due to central venous catheter, initial encounter: Secondary | ICD-10-CM

## 2020-06-28 DIAGNOSIS — T8459XD Infection and inflammatory reaction due to other internal joint prosthesis, subsequent encounter: Secondary | ICD-10-CM | POA: Diagnosis not present

## 2020-06-28 DIAGNOSIS — Z96642 Presence of left artificial hip joint: Secondary | ICD-10-CM

## 2020-06-28 MED ORDER — CEFDINIR 300 MG PO CAPS: 300.0000 mg | ORAL_CAPSULE | Freq: Two times a day (BID) | ORAL | 11 refills | Status: DC

## 2020-06-28 MED ORDER — DOXYCYCLINE HYCLATE 100 MG PO TABS: 100.0000 mg | ORAL_TABLET | Freq: Two times a day (BID) | ORAL | 11 refills | Status: DC

## 2020-06-28 NOTE — Progress Notes (Signed)
Subjective:  Chief complaint follow-up for prosthetic joint infection eosinophilic pneumonia and persistent weeping of fluid from his arm where he had an IV that infiltrated   Patient ID: Jacob Mitchell, male    DOB: 1945/04/24, 75 y.o.   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  Unfortunately he developed eosinophilic pneumonia (though we never prove this with biopsy clinically he had this) severe enough that he was admitted to the ICU and placed on BiPAP and nearly had to be intubated.  Fortunately he responded promptly to corticosteroid therapy in the interim his IV antibiotics have been changed to vancomycin and cefepime for possible bacterial lobe Multi lobar pneumonia.  He ultimately was changed over to oral doxycycline and cefdinir to provide coverage of his left prosthetic hip given that no organism was ever isolated.  He has seen Dr. Kara Mead with lobe our pulmonary critical care medicine for follow-up who is happy with his progress and told him he can come back as needed.  The patient is continuing his corticosteroid taper to eventually get off this medication.  He tells me he does not have any hip pain whatsoever and his wife showed me a video of [name] walking in the past with a prior prosthetic hip which made a squeaking noise as he moved.  He tells me that Dr. Alvan Dame had told him that he would likely be on antibiotics for the rest of his life.  He still does have weeping fluid from his left arm where he had failure of his PICC line and infiltration of the soft tissue with fluids.  Past Medical History:  Diagnosis Date  . Benign localized prostatic hyperplasia with lower urinary tract symptoms (LUTS)   . Complication of anesthesia    pt says he gets "restless" when he is waking up and nurses have had to "hold him down"- happened several surgeries ago    . Diverticulosis of colon   . History of colon polyps   . History of kidney stones   . History of MRSA infection 2007   post right shoulder surgery  . History of urinary retention 05/31/2017   due to BPH  . Incomplete emptying of bladder   . OA (osteoarthritis)   . Peripheral neuropathy    feet-- hx frost bite  . Pinched nerve in neck    right index and middle fingers numbness  . Wears glasses   . Wears hearing aid in both ears     Past Surgical History:  Procedure Laterality Date  . CATARACT EXTRACTION Left 01/13/2020  . COLONOSCOPY    . CYST REMOVAL LEG Right 1975  . CYSTOSCOPY WITH INSERTION OF UROLIFT N/A 07/20/2017   Procedure: CYSTOSCOPY WITH INSERTION OF UROLIFT;  Surgeon: Cleon Gustin, MD;  Location: [facility];  Service: Urology;  Laterality: N/A;  . CYSTOSCOPY WITH INSERTION OF UROLIFT    . DISTAL CLAVICLE EXCISION Left 1977   left shoulder  . ELBOW SURGERY Right 1969;  1980  . HAND SURGERY Right 1968  . INCISION AND DRAINAGE HIP Left 05/01/2020   Procedure: IRRIGATION AND DEBRIDEMENT LEFT HIP SEROMA;  Surgeon: Paralee Cancel, MD;  Location: [facility];  Service: Orthopedics;  Laterality: Left;  90 mins  . LAMINECTOMY  1970   L4-5  . LUMBAR FUSION  2013  approx.   L1--L5  . ORIF HIP  FRACTURE Left 01/17/2020   Procedure: OPEN REDUCTION INTERNAL FIXATION LEFT HIP GREATER TROCHANTER FRACTURE;  Surgeon: Paralee Cancel, MD;  Location: [facility];  Service: Orthopedics;  Laterality: Left;  90 MINS  . ORIF PERIPROSTHETIC FRACTURE Left 02/27/2020   Procedure: OPEN REDUCTION INTERNAL FIXATION (ORIF) LEFT PERI-PROSTETIC  HIP FRACTURE;  Surgeon: Paralee Cancel, MD;  Location: [facility];  Service: Orthopedics;  Laterality: Left;  21mins  . ROTATOR CUFF REPAIR Right x5    last one 2007   5 surgeries in 9 days--- original repair then 4 sx'  I&D sx's for MRSA infection  . SHOULDER ARTHROSCOPY WITH DISTAL CLAVICLE RESECTION Left 1977  . SHOULDER SURGERY Left 1976  . TOTAL  HIP ARTHROPLASTY Left 11/23/2015   Procedure: LEFT TOTAL HIP ARTHROPLASTY ANTERIOR APPROACH;  Surgeon: Paralee Cancel, MD;  Location: [facility];  Service: Orthopedics;  Laterality: Left;  . TOTAL HIP REVISION     10/14/16 Dr. Alvan Dame  . TOTAL HIP REVISION Left 10/14/2016   Procedure: TOTAL HIP REVISION POSTERIOR APPROACH;  Surgeon: Paralee Cancel, MD;  Location: [facility];  Service: Orthopedics;  Laterality: Left; (total of 4 hip surgeries in all)    Family History  Problem Relation Age of Onset  . Alcohol abuse Father   . Drug abuse Brother       Social History   Socioeconomic History  . Marital status: Married    Spouse name: Not on file  . Number of children: Not on file  . Years of education: Not on file  . Highest education level: Not on file  Occupational History  . Not on file  Tobacco Use  . Smoking status: Never Smoker  . Smokeless tobacco: Never Used  Vaping Use  . Vaping Use: Never used  Substance and Sexual Activity  . Alcohol use: No  . Drug use: No  . Sexual activity: Yes  Other Topics Concern  . Not on file  Social History Narrative  . Not on file   Social Determinants of Health   Financial Resource Strain: Not on file  Food Insecurity: Not on file  Transportation Needs: Not on file  Physical Activity: Not on file  Stress: Not on file  Social Connections: Not on file    Allergies  Allergen Reactions  . Aspartame And Phenylalanine Palpitations    Artificial sweetener- hrt 140  went to ED  . Daptomycin Other (See Comments)    Presumed Eosinophilic pneumonia     Current Outpatient Medications:  .  predniSONE (DELTASONE) 20 MG tablet, Take 3 tablets (60 mg total) by mouth daily with breakfast., Disp: , Rfl:  .  [START ON 07/10/2020] predniSONE (DELTASONE) 5 MG tablet, Take 3 tablets (15 mg total) by mouth daily with breakfast for 7 days, THEN 2 tablets (10 mg total) daily with breakfast for 7 days, THEN 1 tablet (5 mg total) daily with breakfast for 7 days.,  Disp: 42 tablet, Rfl: 0 .  traZODone (DESYREL) 100 MG tablet, Take 1 tablet (100 mg total) by mouth at bedtime., Disp: 28 tablet, Rfl: 0 .  cefdinir (OMNICEF) 300 MG capsule, Take 1 capsule (300 mg total) by mouth every 12 (twelve) hours., Disp: 60 capsule, Rfl: 11 .  doxycycline (VIBRA-TABS) 100 MG tablet, Take 1 tablet (100 mg total) by mouth every 12 (twelve) hours., Disp: 60 tablet, Rfl: 11   Review of Systems  Constitutional: Negative for activity change, appetite change, chills, diaphoresis, fatigue, fever and unexpected weight change.  HENT: Negative for congestion, rhinorrhea, sinus  pressure, sneezing, sore throat and trouble swallowing.   Eyes: Negative for photophobia and visual disturbance.  Respiratory: Negative for cough, chest tightness, shortness of breath, wheezing and stridor.   Cardiovascular: Negative for chest pain, palpitations and leg swelling.  Gastrointestinal: Negative for abdominal distention, abdominal pain, anal bleeding, blood in stool, constipation, diarrhea, nausea and vomiting.  Genitourinary: Negative for difficulty urinating, dysuria, flank pain and hematuria.  Musculoskeletal: Negative for arthralgias, back pain, gait problem, joint swelling and myalgias.  Skin: Positive for color change. Negative for pallor, rash and wound.  Neurological: Negative for dizziness, tremors, weakness and light-headedness.  Hematological: Negative for adenopathy. Does not bruise/bleed easily.  Psychiatric/Behavioral: Negative for agitation, behavioral problems, confusion, decreased concentration, dysphoric mood and sleep disturbance.       Objective:   Physical Exam Constitutional:      General: He is not in acute distress.    Appearance: Normal appearance. He is well-developed. He is not ill-appearing or diaphoretic.  HENT:     Head: Normocephalic and atraumatic.     Right Ear: Hearing and external ear normal.     Left Ear: Hearing and external ear normal.     Nose: No  nasal deformity or rhinorrhea.  Eyes:     General: No scleral icterus.    Extraocular Movements: Extraocular movements intact.     Conjunctiva/sclera: Conjunctivae normal.     Right eye: Right conjunctiva is not injected.     Left eye: Left conjunctiva is not injected.  Neck:     Vascular: No JVD.  Cardiovascular:     Rate and Rhythm: Normal rate and regular rhythm.     Heart sounds: Normal heart sounds, S1 normal and S2 normal. No murmur heard. No friction rub.  Pulmonary:     Effort: Pulmonary effort is normal. No respiratory distress.  Abdominal:     General: Bowel sounds are normal. There is no distension.     Palpations: Abdomen is soft.     Tenderness: There is no abdominal tenderness.  Musculoskeletal:        General: Normal range of motion.     Right shoulder: Normal.     Left shoulder: Normal.     Cervical back: Normal range of motion and neck supple.     Right hip: Normal.     Left hip: Normal.     Right knee: Normal.     Left knee: Normal.  Lymphadenopathy:     Head:     Right side of head: No submandibular, preauricular or posterior auricular adenopathy.     Left side of head: No submandibular, preauricular or posterior auricular adenopathy.     Cervical: No cervical adenopathy.     Right cervical: No superficial or deep cervical adenopathy.    Left cervical: No superficial or deep cervical adenopathy.  Skin:    General: Skin is warm and dry.     Coloration: Skin is not pale.     Findings: No abrasion, bruising, ecchymosis, erythema, lesion or rash.     Nails: There is no clubbing.  Neurological:     Mental Status: He is alert and oriented to person, place, and time.     Sensory: No sensory deficit.     Coordination: Coordination normal.     Gait: Gait normal.  Psychiatric:        Attention and Perception: He is attentive.        Mood and Affect: Mood normal.        Speech:  Speech normal.        Behavior: Behavior normal. Behavior is cooperative.         Thought Content: Thought content normal.        Judgment: Judgment normal.     Left arm still edematous and weeping clear fluid      Assessment & Plan:   Prosthetic hip joint infection: Continue doxycycline and cefdinir.  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    I spent greater than 40 minutes with the patient including greater than 50% of time in face to face counsel of the patient and his wife in reading his pertinent radiographic films, his labs, micro data and in coordination of his care.

## 2020-06-29 ENCOUNTER — Telehealth: Payer: Self-pay

## 2020-06-29 LAB — CBC WITH DIFFERENTIAL/PLATELET
Absolute Monocytes: 300 cells/uL (ref 200–950)
Basophils Absolute: 8 cells/uL (ref 0–200)
Basophils Relative: 0.1 %
Eosinophils Absolute: 23 cells/uL (ref 15–500)
Eosinophils Relative: 0.3 %
HCT: 36.1 % — ABNORMAL LOW (ref 38.5–50.0)
Hemoglobin: 10.7 g/dL — ABNORMAL LOW (ref 13.2–17.1)
Lymphs Abs: 533 cells/uL — ABNORMAL LOW (ref 850–3900)
MCH: 24.3 pg — ABNORMAL LOW (ref 27.0–33.0)
MCHC: 29.6 g/dL — ABNORMAL LOW (ref 32.0–36.0)
MCV: 82 fL (ref 80.0–100.0)
MPV: 10.6 fL (ref 7.5–12.5)
Monocytes Relative: 4 %
Neutro Abs: 6638 cells/uL (ref 1500–7800)
Neutrophils Relative %: 88.5 %
Platelets: 150 10*3/uL (ref 140–400)
RBC: 4.4 10*6/uL (ref 4.20–5.80)
RDW: 18.5 % — ABNORMAL HIGH (ref 11.0–15.0)
Total Lymphocyte: 7.1 %
WBC: 7.5 10*3/uL (ref 3.8–10.8)

## 2020-06-29 LAB — BASIC METABOLIC PANEL WITH GFR
BUN/Creatinine Ratio: 26 (calc) — ABNORMAL HIGH (ref 6–22)
BUN: 33 mg/dL — ABNORMAL HIGH (ref 7–25)
CO2: 23 mmol/L (ref 20–32)
Calcium: 8.6 mg/dL (ref 8.6–10.3)
Chloride: 105 mmol/L (ref 98–110)
Creat: 1.25 mg/dL — ABNORMAL HIGH (ref 0.70–1.18)
GFR, Est African American: 64 mL/min/{1.73_m2} (ref >=60)
GFR, Est Non African American: 56 mL/min/{1.73_m2} — ABNORMAL LOW (ref >=60)
Glucose, Bld: 116 mg/dL — ABNORMAL HIGH (ref 65–99)
Potassium: 4.6 mmol/L (ref 3.5–5.3)
Sodium: 139 mmol/L (ref 135–146)

## 2020-06-29 LAB — C-REACTIVE PROTEIN: CRP: 0.6 mg/L (ref <8.0)

## 2020-06-29 LAB — SEDIMENTATION RATE: Sed Rate: 2 mm/h (ref 0–20)

## 2020-06-29 NOTE — Telephone Encounter (Signed)
Inflammatory marker results relayed to patient. No questions at this time. Instructed to continued abx as ordered.   Dillen Belmontes Lorita Officer, RN

## 2020-06-29 NOTE — Telephone Encounter (Signed)
-----   Message from Truman Hayward, MD sent at 06/29/2020  8:32 AM EDT ----- Inflammatory markers reassuring, his cr is a little bit up but not much

## 2020-07-05 ENCOUNTER — Telehealth: Payer: Self-pay | Admitting: Nurse Practitioner

## 2020-07-05 NOTE — Telephone Encounter (Signed)
Left message for patient to call back and schedule Medicare Annual Wellness Visit (AWV) in office.   If not able to come in office, please offer to do virtually or by telephone.   Due for AWVI  Please schedule at anytime with Nurse Health Advisor.   

## 2020-07-13 ENCOUNTER — Telehealth: Payer: Self-pay | Admitting: Nurse Practitioner

## 2020-07-13 NOTE — Telephone Encounter (Signed)
Left message for patient to call back and schedule Medicare Annual Wellness Visit (AWV).   Please offer to do virtually or by telephone.   Due for AWVI  Please schedule at anytime with Nurse Health Advisor.   

## 2020-08-03 DIAGNOSIS — Z96642 Presence of left artificial hip joint: Secondary | ICD-10-CM | POA: Diagnosis not present

## 2020-08-21 ENCOUNTER — Encounter (HOSPITAL_COMMUNITY): Payer: Self-pay

## 2020-08-21 ENCOUNTER — Ambulatory Visit (HOSPITAL_BASED_OUTPATIENT_CLINIC_OR_DEPARTMENT_OTHER)
Admission: RE | Admit: 2020-08-21 | Discharge: 2020-08-21 | Disposition: A | Discharge status: Home / Self Care | Payer: Medicare Other | Source: Ambulatory Visit | Attending: Infectious Disease | Admitting: Infectious Disease

## 2020-08-21 ENCOUNTER — Other Ambulatory Visit: Payer: Self-pay

## 2020-08-21 ENCOUNTER — Ambulatory Visit (INDEPENDENT_AMBULATORY_CARE_PROVIDER_SITE_OTHER): Payer: Medicare Other | Admitting: Infectious Disease

## 2020-08-21 ENCOUNTER — Encounter: Payer: Self-pay | Admitting: Infectious Disease

## 2020-08-21 ENCOUNTER — Emergency Department (HOSPITAL_COMMUNITY)
Admission: EM | Admit: 2020-08-21 | Discharge: 2020-08-21 | Disposition: A | Discharge status: Home / Self Care | Payer: Medicare Other | Attending: Emergency Medicine | Admitting: Emergency Medicine

## 2020-08-21 VITALS — BP 124/84 | HR 71 | Temp 97.6°F | Ht 77.0 in | Wt 302.0 lb

## 2020-08-21 DIAGNOSIS — Z7901 Long term (current) use of anticoagulants: Secondary | ICD-10-CM | POA: Insufficient documentation

## 2020-08-21 DIAGNOSIS — M79602 Pain in left arm: Secondary | ICD-10-CM | POA: Diagnosis present

## 2020-08-21 DIAGNOSIS — Z96642 Presence of left artificial hip joint: Secondary | ICD-10-CM | POA: Diagnosis not present

## 2020-08-21 DIAGNOSIS — T8459XD Infection and inflammatory reaction due to other internal joint prosthesis, subsequent encounter: Secondary | ICD-10-CM

## 2020-08-21 DIAGNOSIS — J8281 Chronic eosinophilic pneumonia: Secondary | ICD-10-CM | POA: Diagnosis not present

## 2020-08-21 DIAGNOSIS — I82A12 Acute embolism and thrombosis of left axillary vein: Secondary | ICD-10-CM

## 2020-08-21 DIAGNOSIS — I82622 Acute embolism and thrombosis of deep veins of left upper extremity: Secondary | ICD-10-CM | POA: Insufficient documentation

## 2020-08-21 DIAGNOSIS — Z96649 Presence of unspecified artificial hip joint: Secondary | ICD-10-CM

## 2020-08-21 DIAGNOSIS — M7989 Other specified soft tissue disorders: Secondary | ICD-10-CM

## 2020-08-21 DIAGNOSIS — T80219A Unspecified infection due to central venous catheter, initial encounter: Secondary | ICD-10-CM | POA: Diagnosis not present

## 2020-08-21 HISTORY — DX: Other specified soft tissue disorders: M79.89

## 2020-08-21 LAB — BASIC METABOLIC PANEL
Anion gap: 7 (ref 5–15)
BUN: 20 mg/dL (ref 8–23)
CO2: 25 mmol/L (ref 22–32)
Calcium: 9.4 mg/dL (ref 8.9–10.3)
Chloride: 107 mmol/L (ref 98–111)
Creatinine, Ser: 1.33 mg/dL — ABNORMAL HIGH (ref 0.61–1.24)
GFR, Estimated: 55 mL/min — ABNORMAL LOW (ref >60)
Glucose, Bld: 107 mg/dL — ABNORMAL HIGH (ref 70–99)
Potassium: 4.3 mmol/L (ref 3.5–5.1)
Sodium: 139 mmol/L (ref 135–145)

## 2020-08-21 LAB — CBC
HCT: 43.9 % (ref 39.0–52.0)
Hemoglobin: 13 g/dL (ref 13.0–17.0)
MCH: 25.3 pg — ABNORMAL LOW (ref 26.0–34.0)
MCHC: 29.6 g/dL — ABNORMAL LOW (ref 30.0–36.0)
MCV: 85.4 fL (ref 80.0–100.0)
Platelets: 248 10*3/uL (ref 150–400)
RBC: 5.14 MIL/uL (ref 4.22–5.81)
RDW: 17.8 % — ABNORMAL HIGH (ref 11.5–15.5)
WBC: 6.1 10*3/uL (ref 4.0–10.5)
nRBC: 0 % (ref 0.0–0.2)

## 2020-08-21 MED ORDER — DOXYCYCLINE HYCLATE 100 MG PO TABS: 100.0000 mg | ORAL_TABLET | Freq: Two times a day (BID) | ORAL | 11 refills | Status: DC

## 2020-08-21 MED ORDER — CEFDINIR 300 MG PO CAPS: 300.0000 mg | ORAL_CAPSULE | Freq: Two times a day (BID) | ORAL | 11 refills | Status: DC

## 2020-08-21 MED ORDER — APIXABAN (ELIQUIS) VTE STARTER PACK (10MG AND 5MG): ORAL_TABLET | ORAL | 0 refills | Status: DC

## 2020-08-21 NOTE — Progress Notes (Signed)
Subjective:  Chief complaint follow-up for prosthetic joint infection eosinophilic pneumonia and persistent left arm edema  Patient ID: Jacob Mitchell, male    DOB: 11/23/1945, 75 y.o.   MRN: [id number]  HPI  75 year old Caucasian man, retired [employer] who had a presumed left prosthetic hip infection and was on ceftriaxone and daptomycin.  Unfortunately he developed eosinophilic pneumonia (though we never prove this with biopsy clinically he had this) severe enough that he was admitted to the ICU and placed on BiPAP and nearly had to be intubated.  Fortunately he responded promptly to corticosteroid therapy in the interim his IV antibiotics have been changed to vancomycin and cefepime for possible bacterial lobe Multi lobar pneumonia.  He ultimately was changed over to oral doxycycline and cefdinir to provide coverage of his left prosthetic hip given that no organism was ever isolated.  He has seen Dr. Kara Mead with lobe our pulmonary critical care medicine for follow-up who is happy with his progress and told him he can come back as needed.  The patient is continuing his corticosteroid taper to eventually get off this medication.  He tells me he does not  really have much hip pain but he is not happy needing to use a walker and with how his leg will move when he sits down.  He tells me that Dr. Alvan Dame had told him that he would likely be on antibiotics for the rest of his life. No longer has weeping of fluid from his upper extremity but he has a sensation in his upper arm where the PICC had been of a bit of a mass that he massaged and then disappeared for the most part.    Past Medical History:  Diagnosis Date   Benign localized prostatic hyperplasia with lower urinary tract symptoms (LUTS)    Complication of anesthesia    pt says he gets "restless" when he is waking up and nurses have had to "hold him down"- happened several surgeries ago    Diverticulosis of colon     History of colon polyps    History of kidney stones    History of MRSA infection 2007   post right shoulder surgery   History of urinary retention 05/31/2017   due to BPH   Incomplete emptying of bladder    OA (osteoarthritis)    Peripheral neuropathy    feet-- hx frost bite   Pinched nerve in neck    right index and middle fingers numbness   Wears glasses    Wears hearing aid in both ears     Past Surgical History:  Procedure Laterality Date   CATARACT EXTRACTION Left 01/13/2020   COLONOSCOPY     CYST REMOVAL LEG Right 1975   CYSTOSCOPY WITH INSERTION OF UROLIFT N/A 07/20/2017   Procedure: CYSTOSCOPY WITH INSERTION OF UROLIFT;  Surgeon: Cleon Gustin, MD;  Location[facility];  Service: Urology;  Laterality: N/A;   CYSTOSCOPY WITH INSERTION OF UROLIFT     DISTAL CLAVICLE EXCISION Left 1977   left shoulder   ELBOW SURGERY Right 1969;  1980   HAND SURGERY Right 1968   INCISION AND DRAINAGE HIP Left 05/01/2020   Procedure: IRRIGATION AND DEBRIDEMENT LEFT HIP SEROMA;  Surgeon: Paralee Cancel, MD;  Location: [facility];  Service: Orthopedics;  Laterality: Left;  90 mins   LAMINECTOMY  1970   L4-5   LUMBAR FUSION  2013  approx.   L1--L5   ORIF HIP  FRACTURE Left 01/17/2020   Procedure: OPEN REDUCTION INTERNAL FIXATION LEFT HIP GREATER TROCHANTER FRACTURE;  Surgeon: Paralee Cancel, MD;  Location: [facility];  Service: Orthopedics;  Laterality: Left;  90 MINS   ORIF PERIPROSTHETIC FRACTURE Left 02/27/2020   Procedure: OPEN REDUCTION INTERNAL FIXATION (ORIF) LEFT PERI-PROSTETIC  HIP FRACTURE;  Surgeon: Paralee Cancel, MD;  Location: [facility];  Service: Orthopedics;  Laterality: Left;  69mins   ROTATOR CUFF REPAIR Right x5    last one 2007   5 surgeries in 9 days--- original repair then 4 sx'  I&D sx's for MRSA infection   SHOULDER ARTHROSCOPY WITH DISTAL CLAVICLE RESECTION Left 1977   SHOULDER SURGERY Left 1976   TOTAL HIP ARTHROPLASTY Left 11/23/2015   Procedure: LEFT TOTAL  HIP ARTHROPLASTY ANTERIOR APPROACH;  Surgeon: Paralee Cancel, MD;  Location: [facility];  Service: Orthopedics;  Laterality: Left;   TOTAL HIP REVISION     10/14/16 Dr. Alvan Dame   TOTAL HIP REVISION Left 10/14/2016   Procedure: TOTAL HIP REVISION POSTERIOR APPROACH;  Surgeon: Paralee Cancel, MD;  Location: [facility];  Service: Orthopedics;  Laterality: Left; (total of 4 hip surgeries in all)    Family History  Problem Relation Age of Onset   Alcohol abuse Father    Drug abuse Brother       Social History   Socioeconomic History   Marital status: Married    Spouse name: Not on file   Number of children: Not on file   Years of education: Not on file   Highest education level: Not on file  Occupational History   Not on file  Tobacco Use   Smoking status: Never   Smokeless tobacco: Never  Vaping Use   Vaping Use: Never used  Substance and Sexual Activity   Alcohol use: No   Drug use: No   Sexual activity: Yes  Other Topics Concern   Not on file  Social History Narrative   Not on file   Social Determinants of Health   Financial Resource Strain: Not on file  Food Insecurity: Not on file  Transportation Needs: Not on file  Physical Activity: Not on file  Stress: Not on file  Social Connections: Not on file    Allergies  Allergen Reactions   Aspartame And Phenylalanine Palpitations    Artificial sweetener- hrt 140  went to ED   Daptomycin Other (See Comments)    Presumed Eosinophilic pneumonia     Current Outpatient Medications:    cefdinir (OMNICEF) 300 MG capsule, Take 1 capsule (300 mg total) by mouth every 12 (twelve) hours., Disp: 60 capsule, Rfl: 11   doxycycline (VIBRA-TABS) 100 MG tablet, Take 1 tablet (100 mg total) by mouth every 12 (twelve) hours., Disp: 60 tablet, Rfl: 11   predniSONE (DELTASONE) 20 MG tablet, Take 3 tablets (60 mg total) by mouth daily with breakfast., Disp: , Rfl:    traZODone (DESYREL) 100 MG tablet, Take 1 tablet (100 mg total) by mouth at bedtime.,  Disp: 28 tablet, Rfl: 0   Review of Systems  Constitutional:  Negative for activity change, appetite change, chills, diaphoresis, fatigue, fever and unexpected weight change.  HENT:  Negative for congestion, rhinorrhea, sinus pressure, sneezing, sore throat and trouble swallowing.   Eyes:  Negative for photophobia and visual disturbance.  Respiratory:  Negative for cough, chest tightness, shortness of breath, wheezing and stridor.   Cardiovascular:  Negative for chest pain, palpitations and leg swelling.  Gastrointestinal:  Negative for abdominal distention, abdominal pain, anal  bleeding, blood in stool, constipation, diarrhea, nausea and vomiting.  Genitourinary:  Negative for difficulty urinating, dysuria, flank pain and hematuria.  Musculoskeletal:  Negative for arthralgias, back pain, gait problem, joint swelling and myalgias.  Skin:  Positive for color change. Negative for pallor, rash and wound.  Neurological:  Negative for dizziness, tremors, weakness and light-headedness.  Hematological:  Negative for adenopathy. Does not bruise/bleed easily.  Psychiatric/Behavioral:  Negative for agitation, behavioral problems, confusion, decreased concentration, dysphoric mood and sleep disturbance.       Objective:   Physical Exam Constitutional:      General: He is not in acute distress.    Appearance: Normal appearance. He is well-developed. He is not ill-appearing or diaphoretic.  HENT:     Head: Normocephalic and atraumatic.     Right Ear: Hearing and external ear normal.     Left Ear: Hearing and external ear normal.     Nose: No nasal deformity or rhinorrhea.  Eyes:     General: No scleral icterus.    Extraocular Movements: Extraocular movements intact.     Conjunctiva/sclera: Conjunctivae normal.     Right eye: Right conjunctiva is not injected.     Left eye: Left conjunctiva is not injected.  Neck:     Vascular: No JVD.  Cardiovascular:     Rate and Rhythm: Normal rate and  regular rhythm.     Heart sounds: Normal heart sounds, S1 normal and S2 normal. No murmur heard.   No friction rub.  Pulmonary:     Effort: Pulmonary effort is normal. No respiratory distress.  Abdominal:     General: Bowel sounds are normal. There is no distension.     Palpations: Abdomen is soft.     Tenderness: There is no abdominal tenderness.  Musculoskeletal:        General: Normal range of motion.     Right shoulder: Deformity present.     Left shoulder: Normal.     Left upper arm: Swelling and edema present.     Cervical back: Normal range of motion and neck supple.  Lymphadenopathy:     Head:     Right side of head: No submandibular, preauricular or posterior auricular adenopathy.     Left side of head: No submandibular, preauricular or posterior auricular adenopathy.     Cervical: No cervical adenopathy.     Right cervical: No superficial or deep cervical adenopathy.    Left cervical: No superficial or deep cervical adenopathy.  Skin:    General: Skin is warm and dry.     Coloration: Skin is not pale.     Findings: No abrasion, bruising, ecchymosis, erythema, lesion or rash.     Nails: There is no clubbing.  Neurological:     Mental Status: He is alert and oriented to person, place, and time.     Sensory: No sensory deficit.     Coordination: Coordination normal.     Gait: Gait normal.  Psychiatric:        Attention and Perception: He is attentive.        Mood and Affect: Mood normal.        Speech: Speech normal.        Behavior: Behavior normal. Behavior is cooperative.        Thought Content: Thought content normal.        Judgment: Judgment normal.    Left arm still edematous       Assessment & Plan:  Left upper extremity  edema due to PICC line   Will get repeat duplex and  if negative MRi to ensure no foreign body present   Prosthetic hip joint infection: Continue doxycycline and cefdinir and push for at least a year of therapy   Eosinophilic  pneumonia: resolved  I spent more than 40 minutes with the patient including face to face counseling of the patient personally reviewing radiographs, along with pertinent laboratory microbiological, data review of medical records before and during the visit and in coordination of his care.

## 2020-08-21 NOTE — ED Triage Notes (Signed)
Patient here with confirmed blood clot to left arm following vascular study. Reports that it all started when PICC removed 3 weeks ago. Patient denies pain and no SOB.

## 2020-08-21 NOTE — Discharge Instructions (Addendum)
Start the medicine as prescribed.  Please call your primary doctor's office to request close follow-up appointment.  Discuss further management with them.  If you have any falls, head injury, blood in your stools, or other new concerning symptom, come back to ER for reassessment.

## 2020-08-21 NOTE — Progress Notes (Signed)
Upper extremity venous LT study completed.  Preliminary results relayed to Jacob Medal, MD with [facility] for Infectious Disease. Attempted to have patient seen at PCP office [place]. Unable to be seen today. Patient taken to [facility] ED.  See CV Proc for preliminary results report.   Darlin Coco, RDMS, RVT

## 2020-08-21 NOTE — ED Provider Notes (Signed)
[facility] EMERGENCY DEPARTMENT Provider Note   CSN: [id number] Arrival date & time: 08/21/20  1421     History No chief complaint on file.   Jacob Mitchell is a 75 y.o. male.  Presents to ER with concern for blood clot.  Patient reports that he has had swelling in his left arm ever since PICC line removed.  Swelling over the past couple weeks is actually gotten better but still having some persistent swelling and some mild pain in the arm.  Noted not sensation in the upper part of his left arm.  No numbness or weakness or tingling.  Denies any chest pain or difficulty breathing, no lightheadedness, no syncope.  Had hip surgery back in April, no other surgeries since.  Denies any history of brain bleed, no history of GI bleed.  HPI     Past Medical History:  Diagnosis Date   Benign localized prostatic hyperplasia with lower urinary tract symptoms (LUTS)    Complication of anesthesia    pt says he gets "restless" when he is waking up and nurses have had to "hold him down"- happened several surgeries ago    Diverticulosis of colon    History of colon polyps    History of kidney stones    History of MRSA infection 2007   post right shoulder surgery   History of urinary retention 05/31/2017   due to BPH   Incomplete emptying of bladder    Left arm swelling 08/21/2020   OA (osteoarthritis)    Peripheral neuropathy    feet-- hx frost bite   Pinched nerve in neck    right index and middle fingers numbness   Wears glasses    Wears hearing aid in both ears     Patient Active Problem List   Diagnosis Date Noted   Left arm swelling 08/21/2020   Severe comorbid illness    Prosthetic hip infection ([place])    PICC line infection, initial encounter    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[place])    Drug-induced lung disease    Cellulitis 05/31/2020   S/P revision of total knee, 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   Greater trochanter fracture ([place]) 01/17/2020   Cough 05/14/2018   Insomnia 10/21/2017    Obese 10/15/2016   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/14/2016   History of nephrolithiasis 07/24/2016   Neuropathy 07/24/2016   S/P left THA, AA 11/23/2015    Past Surgical History:  Procedure Laterality Date   CATARACT EXTRACTION Left 01/13/2020   COLONOSCOPY     CYST REMOVAL LEG Right 1975   CYSTOSCOPY WITH INSERTION OF UROLIFT N/A 07/20/2017   Procedure: CYSTOSCOPY WITH INSERTION OF UROLIFT;  Surgeon: Cleon Gustin, MD;  Location: [facility];  Service: Urology;  Laterality: N/A;   CYSTOSCOPY WITH INSERTION OF UROLIFT     DISTAL CLAVICLE EXCISION Left 1977   left shoulder   ELBOW SURGERY Right 1969;  1980   HAND SURGERY Right 1968   INCISION AND DRAINAGE HIP Left 05/01/2020   Procedure: IRRIGATION AND DEBRIDEMENT LEFT HIP SEROMA;  Surgeon: Paralee Cancel, MD;  Location: [facility];  Service: Orthopedics;  Laterality: Left;  90 mins   LAMINECTOMY  1970   L4-5   LUMBAR FUSION  2013  approx.   L1--L5   ORIF HIP FRACTURE Left 01/17/2020   Procedure: OPEN REDUCTION INTERNAL FIXATION LEFT HIP GREATER TROCHANTER FRACTURE;  Surgeon: Paralee Cancel, MD;  Location: [facility];  Service: Orthopedics;  Laterality: Left;  90 MINS  ORIF PERIPROSTHETIC FRACTURE Left 02/27/2020   Procedure: OPEN REDUCTION INTERNAL FIXATION (ORIF) LEFT PERI-PROSTETIC  HIP FRACTURE;  Surgeon: Paralee Cancel, MD;  Location: [facility];  Service: Orthopedics;  Laterality: Left;  16mins   ROTATOR CUFF REPAIR Right x5    last one 2007   5 surgeries in 9 days--- original repair then 4 sx'  I&D sx's for MRSA infection   SHOULDER ARTHROSCOPY WITH DISTAL CLAVICLE RESECTION Left 1977   SHOULDER SURGERY Left 1976   TOTAL HIP ARTHROPLASTY Left 11/23/2015   Procedure: LEFT TOTAL HIP ARTHROPLASTY ANTERIOR APPROACH;  Surgeon: Paralee Cancel, MD;  Location: [facility];  Service: Orthopedics;  Laterality: Left;   TOTAL HIP REVISION     10/14/16 Dr. Alvan Dame   TOTAL HIP REVISION Left 10/14/2016   Procedure: TOTAL HIP REVISION POSTERIOR  APPROACH;  Surgeon: Paralee Cancel, MD;  Location: [facility];  Service: Orthopedics;  Laterality: Left; (total of 4 hip surgeries in all)       Family History  Problem Relation Age of Onset   Alcohol abuse Father    Drug abuse Brother     Social History   Tobacco Use   Smoking status: Never   Smokeless tobacco: Never  Vaping Use   Vaping Use: Never used  Substance Use Topics   Alcohol use: No   Drug use: No    Home Medications Prior to Admission medications   Medication Sig Start Date End Date Taking? Authorizing Provider  acetaminophen (TYLENOL) 500 MG tablet Take 1,000-1,500 mg by mouth every 6 (six) hours as needed for mild pain.   Yes [provider]  APIXABAN Arne Cleveland) VTE STARTER PACK (10MG  AND 5MG ) Take as directed on package: start with two-5mg  tablets twice daily for 7 days. On day 8, switch to one-5mg  tablet twice daily. 08/21/20  Yes Lucrezia Starch, MD  cefdinir (OMNICEF) 300 MG capsule Take 1 capsule (300 mg total) by mouth every 12 (twelve) hours. 08/21/20  Yes Tommy Medal, Lavell Islam, MD  doxycycline (VIBRA-TABS) 100 MG tablet Take 1 tablet (100 mg total) by mouth every 12 (twelve) hours. 08/21/20  Yes Tommy Medal, Lavell Islam, MD  MELATONIN PO Take 1 tablet by mouth at bedtime as needed (sleep).   Yes [provider]  traZODone (DESYREL) 100 MG tablet Take 1 tablet (100 mg total) by mouth at bedtime. 06/26/20  Yes Rigoberto Noel, MD    Allergies    Aspartame and phenylalanine and Daptomycin  Review of Systems   Review of Systems  Constitutional:  Negative for chills and fever.  HENT:  Negative for ear pain and sore throat.   Eyes:  Negative for pain and visual disturbance.  Respiratory:  Negative for cough and shortness of breath.   Cardiovascular:  Negative for chest pain and palpitations.  Gastrointestinal:  Negative for abdominal pain and vomiting.  Genitourinary:  Negative for dysuria and hematuria.  Musculoskeletal:  Positive for arthralgias  and myalgias. Negative for back pain.  Skin:  Negative for color change and rash.  Neurological:  Negative for seizures and syncope.  All other systems reviewed and are negative.  Physical Exam Updated Vital Signs BP 98/64 (BP Location: Right Arm)   Pulse (!) 31   Temp 98.4 F (36.9 C) (Oral)   Resp 20   SpO2 98%   Physical Exam Vitals and nursing note reviewed.  Constitutional:      Appearance: He is well-developed.  HENT:     Head: Normocephalic and atraumatic.  Eyes:  Conjunctiva/sclera: Conjunctivae normal.  Cardiovascular:     Rate and Rhythm: Normal rate and regular rhythm.     Heart sounds: No murmur heard. Pulmonary:     Effort: Pulmonary effort is normal. No respiratory distress.     Breath sounds: Normal breath sounds.  Abdominal:     Palpations: Abdomen is soft.     Tenderness: There is no abdominal tenderness.  Musculoskeletal:     Cervical back: Neck supple.     Comments: Left upper extremity: There is mild generalized swelling throughout the extremity, compartments soft, some tenderness along proximal arm, no overlying erythema, normal radial pulse, sensation intact distally  Skin:    General: Skin is warm and dry.  Neurological:     Mental Status: He is alert.    ED Results / Procedures / Treatments   Labs (all labs ordered are listed, but only abnormal results are displayed) Labs Reviewed  BASIC METABOLIC PANEL - Abnormal; Notable for the following components:      Result Value   Glucose, Bld 107 (*)    Creatinine, Ser 1.33 (*)    GFR, Estimated 55 (*)    All other components within normal limits  CBC - Abnormal; Notable for the following components:   MCH 25.3 (*)    MCHC 29.6 (*)    RDW 17.8 (*)    All other components within normal limits    EKG None  Radiology VAS [place] UPPER EXTREMITY VENOUS DUPLEX  Result Date: 08/21/2020 UPPER VENOUS STUDY  Patient Name:  ODILON CASS  Date of Exam:   08/21/2020 Medical Rec #: [id number]      Accession #:    [id number] Date of Birth: [birth year]     Patient Gender: M Patient Age:   75Y Exam Location:  [facility] Procedure:      VAS [place] UPPER EXTREMITY VENOUS DUPLEX Referring Phys: [id number] CORNELIUS N VAN DAM --------------------------------------------------------------------------------  Indications: Swelling, palpable cord, history of LT PICC line Comparison Study: 05-31-2020 Prior LT upper extremity venous showed no evidence                   of DVT. Performing Technologist: Darlin Coco RDMS,RVT  Examination Guidelines: A complete evaluation includes B-mode imaging, spectral Doppler, color Doppler, and power Doppler as needed of all accessible portions of each vessel. Bilateral testing is considered an integral part of a complete examination. Limited examinations for reoccurring indications may be performed as noted.  Left Findings: +----------+------------+---------+-----------+----------+-------+ LEFT      CompressiblePhasicitySpontaneousPropertiesSummary +----------+------------+---------+-----------+----------+-------+ IJV           Full       Yes       Yes                      +----------+------------+---------+-----------+----------+-------+ Subclavian    Full       Yes       Yes                      +----------+------------+---------+-----------+----------+-------+ Axillary      None       No        No                Acute  +----------+------------+---------+-----------+----------+-------+ Brachial    Partial      Yes       Yes               Acute  +----------+------------+---------+-----------+----------+-------+ Radial  Full                                          +----------+------------+---------+-----------+----------+-------+ Ulnar         Full                                          +----------+------------+---------+-----------+----------+-------+ Cephalic      Full                                           +----------+------------+---------+-----------+----------+-------+ Basilic       None       No        No                Acute  +----------+------------+---------+-----------+----------+-------+  Summary:  Right: No evidence of thrombosis in the subclavian.  Left: Findings consistent with acute deep vein thrombosis involving the left brachial veins and left axillary vein. Findings consistent with acute superficial vein thrombosis involving the left basilic vein.  *See table(s) above for measurements and observations.  Diagnosing physician: Ruta Hinds MD Electronically signed by Ruta Hinds MD on 08/21/2020 at 5:09:59 PM.    Final     Procedures Procedures   Medications Ordered in ED Medications - No data to display  ED Course  I have reviewed the triage vital signs and the nursing notes.  Pertinent labs & imaging results that were available during my care of the patient were reviewed by me and considered in my medical decision making (see chart for details).    MDM Rules/Calculators/A&P                          75 year old male presents to ER with concern for DVT and arm.  History of recent PICC line.  PICC line has already been removed.  DVT study showed left brachial vein and axillary vein as well as left basilic vein.  Discussed with pharmacy, they recommend either Eliquis or Xarelto.  Patient is neurovascularly intact.  No contraindications to initiation of anticoagulation identified.  Discussed risk and benefits of blood thinners.  Patient agreeable to proceed.  Instructed to follow-up closely with his primary doctor.  Provided Rx for apixaban starter pack.  No signs or symptoms of PE.  Reviewed return precautions in detail, discharged.   After the discussed management above, the patient was determined to be safe for discharge.  The patient was in agreement with this plan and all questions regarding their care were answered.  ED return precautions were discussed and the patient  will return to the ED with any significant worsening of condition.   Final Clinical Impression(s) / ED Diagnoses Final diagnoses:  Acute deep vein thrombosis (DVT) of axillary vein of left upper extremity ([place])    Rx / DC Orders ED Discharge Orders          Ordered    APIXABAN (ELIQUIS) VTE STARTER PACK (10MG  AND 5MG )        08/21/20 1642             Lucrezia Starch, MD 08/22/20 1523

## 2020-08-22 LAB — CBC WITH DIFFERENTIAL/PLATELET
Absolute Monocytes: 828 cells/uL (ref 200–950)
Basophils Absolute: 72 cells/uL (ref 0–200)
Basophils Relative: 1.2 %
Eosinophils Absolute: 240 cells/uL (ref 15–500)
Eosinophils Relative: 4 %
HCT: 43.4 % (ref 38.5–50.0)
Hemoglobin: 13.1 g/dL — ABNORMAL LOW (ref 13.2–17.1)
Lymphs Abs: 1278 cells/uL (ref 850–3900)
MCH: 25.2 pg — ABNORMAL LOW (ref 27.0–33.0)
MCHC: 30.2 g/dL — ABNORMAL LOW (ref 32.0–36.0)
MCV: 83.5 fL (ref 80.0–100.0)
MPV: 10.3 fL (ref 7.5–12.5)
Monocytes Relative: 13.8 %
Neutro Abs: 3582 cells/uL (ref 1500–7800)
Neutrophils Relative %: 59.7 %
Platelets: 244 10*3/uL (ref 140–400)
RBC: 5.2 10*6/uL (ref 4.20–5.80)
RDW: 17 % — ABNORMAL HIGH (ref 11.0–15.0)
Total Lymphocyte: 21.3 %
WBC: 6 10*3/uL (ref 3.8–10.8)

## 2020-08-22 LAB — COMPLETE METABOLIC PANEL WITH GFR
AG Ratio: 2.1 (calc) (ref 1.0–2.5)
ALT: 10 U/L (ref 9–46)
AST: 13 U/L (ref 10–35)
Albumin: 4.1 g/dL (ref 3.6–5.1)
Alkaline phosphatase (APISO): 139 U/L (ref 35–144)
BUN: 21 mg/dL (ref 7–25)
CO2: 23 mmol/L (ref 20–32)
Calcium: 9.7 mg/dL (ref 8.6–10.3)
Chloride: 107 mmol/L (ref 98–110)
Creat: 1.25 mg/dL (ref 0.70–1.28)
Globulin: 2 g/dL (calc) (ref 1.9–3.7)
Glucose, Bld: 88 mg/dL (ref 65–99)
Potassium: 4.6 mmol/L (ref 3.5–5.3)
Sodium: 139 mmol/L (ref 135–146)
Total Bilirubin: 0.5 mg/dL (ref 0.2–1.2)
Total Protein: 6.1 g/dL (ref 6.1–8.1)
eGFR: 59 mL/min/{1.73_m2} — ABNORMAL LOW (ref >=60)

## 2020-08-22 LAB — C-REACTIVE PROTEIN: CRP: 9.1 mg/L — ABNORMAL HIGH (ref <8.0)

## 2020-08-22 LAB — SEDIMENTATION RATE: Sed Rate: 2 mm/h (ref 0–20)

## 2020-09-21 ENCOUNTER — Telehealth: Payer: Self-pay

## 2020-09-21 DIAGNOSIS — Z96649 Presence of unspecified artificial hip joint: Secondary | ICD-10-CM

## 2020-09-21 MED ORDER — APIXABAN 5 MG PO TABS: 5.0000 mg | ORAL_TABLET | Freq: Two times a day (BID) | ORAL | 0 refills | Status: DC

## 2020-09-21 NOTE — Telephone Encounter (Signed)
Per Baldo Ash Nche-NP Rx sent in for Eliquis 5 mg BID for 5 days until appointment 10/26/20. Pt made aware by Aniceto Boss.

## 2020-09-21 NOTE — Telephone Encounter (Signed)
Patient wife called and states husband was given Eliquis for blood clots in the hospital and is now out of the medication would like a refill because patient is out of medication and they are concerned because they do not want him to get a blood clot. Please advise.

## 2020-09-21 NOTE — Telephone Encounter (Signed)
Patient's wife called asking for refill of Eliquis. Advised her to contact patient's primary care provider as blood thinners would be managed by her. She verbalized understanding and has no further questions.   Beryle Flock, RN

## 2020-09-23 ENCOUNTER — Telehealth: Payer: Self-pay | Admitting: Nurse Practitioner

## 2020-09-23 DIAGNOSIS — Z96649 Presence of unspecified artificial hip joint: Secondary | ICD-10-CM

## 2020-09-24 NOTE — Telephone Encounter (Signed)
LVM informing patient to schedule appointment for continued medications.

## 2020-09-24 NOTE — Telephone Encounter (Signed)
Since we had to reschedule his appt, I send additional eliquis tabs. Advise to schedule an appt before he is out of medication. Thank you

## 2020-09-25 ENCOUNTER — Ambulatory Visit: Payer: Medicare Other | Admitting: Nurse Practitioner

## 2020-09-25 ENCOUNTER — Ambulatory Visit: Payer: Medicare Other | Admitting: Family Medicine

## 2020-10-05 DIAGNOSIS — Z96642 Presence of left artificial hip joint: Secondary | ICD-10-CM | POA: Diagnosis not present

## 2020-10-08 DIAGNOSIS — H35372 Puckering of macula, left eye: Secondary | ICD-10-CM | POA: Diagnosis not present

## 2020-10-08 DIAGNOSIS — D23121 Other benign neoplasm of skin of left upper eyelid, including canthus: Secondary | ICD-10-CM | POA: Diagnosis not present

## 2020-10-08 DIAGNOSIS — H43813 Vitreous degeneration, bilateral: Secondary | ICD-10-CM | POA: Diagnosis not present

## 2020-10-08 DIAGNOSIS — Z961 Presence of intraocular lens: Secondary | ICD-10-CM | POA: Diagnosis not present

## 2020-10-14 ENCOUNTER — Other Ambulatory Visit: Payer: Self-pay | Admitting: Nurse Practitioner

## 2020-10-14 DIAGNOSIS — Z96649 Presence of unspecified artificial hip joint: Secondary | ICD-10-CM

## 2020-10-16 NOTE — Telephone Encounter (Signed)
Pt has appt 10/17/2020. Refill request will be addressed during ov. Sw, cma

## 2020-10-17 ENCOUNTER — Ambulatory Visit (INDEPENDENT_AMBULATORY_CARE_PROVIDER_SITE_OTHER): Payer: Medicare Other | Admitting: Nurse Practitioner

## 2020-10-17 ENCOUNTER — Other Ambulatory Visit: Payer: Self-pay

## 2020-10-17 ENCOUNTER — Encounter: Payer: Self-pay | Admitting: Nurse Practitioner

## 2020-10-17 VITALS — BP 124/70 | HR 76 | Temp 97.7°F | Wt 324.4 lb

## 2020-10-17 DIAGNOSIS — I82622 Acute embolism and thrombosis of deep veins of left upper extremity: Secondary | ICD-10-CM | POA: Diagnosis not present

## 2020-10-17 DIAGNOSIS — R739 Hyperglycemia, unspecified: Secondary | ICD-10-CM

## 2020-10-17 DIAGNOSIS — I82409 Acute embolism and thrombosis of unspecified deep veins of unspecified lower extremity: Secondary | ICD-10-CM | POA: Insufficient documentation

## 2020-10-17 LAB — BASIC METABOLIC PANEL
BUN: 16 mg/dL (ref 6–23)
CO2: 20 mEq/L (ref 19–32)
Calcium: 9.5 mg/dL (ref 8.4–10.5)
Chloride: 106 mEq/L (ref 96–112)
Creatinine, Ser: 0.89 mg/dL (ref 0.40–1.50)
GFR: 82.83 mL/min (ref >60.00)
Glucose, Bld: 84 mg/dL (ref 70–99)
Potassium: 4.2 mEq/L (ref 3.5–5.1)
Sodium: 139 mEq/L (ref 135–145)

## 2020-10-17 LAB — CBC
HCT: 41.4 % (ref 39.0–52.0)
Hemoglobin: 13.9 g/dL (ref 13.0–17.0)
MCHC: 33.5 g/dL (ref 30.0–36.0)
MCV: 91.7 fl (ref 78.0–100.0)
Platelets: 186 10*3/uL (ref 150.0–400.0)
RBC: 4.52 Mil/uL (ref 4.22–5.81)
RDW: 15 % (ref 11.5–15.5)
WBC: 6.2 10*3/uL (ref 4.0–10.5)

## 2020-10-17 LAB — HEMOGLOBIN A1C: Hgb A1c MFr Bld: 5.4 % (ref 4.6–6.5)

## 2020-10-17 MED ORDER — APIXABAN 5 MG PO TABS: 5.0000 mg | ORAL_TABLET | Freq: Two times a day (BID) | ORAL | 1 refills | Status: DC

## 2020-10-17 NOTE — Assessment & Plan Note (Addendum)
Provoked by PICC line in left UE. Venous doppler completed 08/21/2020: "No evidence of thrombosis in the subclavian. Left: Findings consistent with acute deep vein thrombosis involving the left brachial veins and left axillary vein. Findings consistent with acute superficial vein thrombosis involving the left basilic vein."  No hx of previous PE and/or DVT. No Hx of A-fib. Current use of eliquis since 08/21/2020. No sign of GI/GU/nose bleed, or large bruising Advised to maintain med till repeat venous doppler on/after 11/21/2020. Refill sent Advised to use upper extremity compression sleeve to help with swelling. Take off at bedtime. Check cbc and BMP today

## 2020-10-17 NOTE — Progress Notes (Signed)
Subjective:  Patient ID: Jacob Mitchell, male    DOB: 05-16-1945  Age: 75 y.o. MRN: [id number]  CC: Follow-up (Pt in need of medication refill for Eliquis/Declines vaccines today. )  HPI  Deep venous thrombosis (HCC) Provoked by PICC line in left UE. Venous doppler completed 08/21/2020: "No evidence of thrombosis in the subclavian. Left: Findings consistent with acute deep vein thrombosis involving the left brachial veins and left axillary vein. Findings consistent with acute superficial vein thrombosis involving the left basilic vein."  No hx of previous PE and/or DVT. No Hx of A-fib. Current use of eliquis since 08/21/2020. No sign of GI/GU/nose bleed, or large bruising Advised to maintain med till repeat venous doppler on/after 11/21/2020. Refill sent Advised to use upper extremity compression sleeve to help with swelling. Take off at bedtime. Check cbc and BMP today    BP Readings from Last 3 Encounters:  10/17/20 124/70  08/21/20 98/64  08/21/20 124/84    Wt Readings from Last 3 Encounters:  10/17/20 (!) 324 lb 6.4 oz (147.1 kg)  08/21/20 (!) 302 lb (137 kg)  06/28/20 (!) 309 lb (140.2 kg)    Also noted elevated glucose during last hospital stay. Will check HgbA1c today.  Reviewed past Medical, Social and Family history today.  Outpatient Medications Prior to Visit  Medication Sig Dispense Refill   acetaminophen (TYLENOL) 500 MG tablet Take 1,000-1,500 mg by mouth every 6 (six) hours as needed for mild pain.     cefdinir (OMNICEF) 300 MG capsule Take 1 capsule (300 mg total) by mouth every 12 (twelve) hours. 60 capsule 11   doxycycline (VIBRA-TABS) 100 MG tablet Take 1 tablet (100 mg total) by mouth every 12 (twelve) hours. 60 tablet 11   MELATONIN PO Take 1 tablet by mouth at bedtime as needed (sleep).     traZODone (DESYREL) 100 MG tablet Take 1 tablet (100 mg total) by mouth at bedtime. 28 tablet 0   No facility-administered medications prior to visit.    ROS See  HPI  Objective:  BP 124/70 (BP Location: Left Arm, Patient Position: Sitting, Cuff Size: Large)   Pulse 76   Temp 97.7 F (36.5 C) (Temporal)   Wt (!) 324 lb 6.4 oz (147.1 kg)   SpO2 98%   BMI 38.47 kg/m   Physical Exam Constitutional:      Appearance: He is obese.  Cardiovascular:     Rate and Rhythm: Normal rate and regular rhythm.     Pulses: Normal pulses.     Heart sounds: Normal heart sounds.  Pulmonary:     Effort: Pulmonary effort is normal.     Breath sounds: Normal breath sounds.  Musculoskeletal:        General: Swelling present. No tenderness.     Left upper arm: Swelling present. No edema.       Arms:     Right lower leg: No edema.     Left lower leg: No edema.     Comments: Mild swelling, no erythema, no induration Normal radial and brachial pulse. Limited ROM at elbows and shoulders due to advanced OA (chronic)  Neurological:     Mental Status: He is alert.    Assessment & Plan:  This visit occurred during the SARS-CoV-2 public health emergency.  Safety protocols were in place, including screening questions prior to the visit, additional usage of staff PPE, and extensive cleaning of exam room while observing appropriate contact time as indicated for disinfecting solutions.   Jacob Mitchell  was seen today for follow-up.  Diagnoses and all orders for this visit:  Acute deep vein thrombosis (DVT) of other vein of left upper extremity (HCC) -     CBC -     Basic metabolic panel -     apixaban (ELIQUIS) 5 MG TABS tablet; Take 1 tablet (5 mg total) by mouth 2 (two) times daily. -     VAS [place] LOWER EXTREMITY VENOUS (DVT); Future  Hyperglycemia -     Hemoglobin A1c   Problem List Items Addressed This Visit       Cardiovascular and Mediastinum   Deep venous thrombosis ([place]) - Primary    Provoked by PICC line in left UE. Venous doppler completed 08/21/2020: "No evidence of thrombosis in the subclavian. Left: Findings consistent with acute deep vein thrombosis  involving the left brachial veins and left axillary vein. Findings consistent with acute superficial vein thrombosis involving the left basilic vein."  No hx of previous PE and/or DVT. No Hx of A-fib. Current use of eliquis since 08/21/2020. No sign of GI/GU/nose bleed, or large bruising Advised to maintain med till repeat venous doppler on/after 11/21/2020. Refill sent Advised to use upper extremity compression sleeve to help with swelling. Take off at bedtime. Check cbc and BMP today         Relevant Medications   apixaban (ELIQUIS) 5 MG TABS tablet   Other Relevant Orders   CBC   Basic metabolic panel   VAS [place] LOWER EXTREMITY VENOUS (DVT)   Other Visit Diagnoses     Hyperglycemia       Relevant Orders   Hemoglobin A1c       Follow-up: Return in about 6 months (around 04/16/2021) for schedule AWV with wellness coach.  Wilfred Lacy, NP

## 2020-10-17 NOTE — Patient Instructions (Addendum)
Go to lab for blood draw Continue eliquis Ok to use upper extremity compression sleeve to help with swelling. Take off at bedtime. You can get sleeve through [facility] or local medical supply store (like [facility] in [place]) You will be contacted to schedule appt for repeat venous US.

## 2020-10-22 DIAGNOSIS — T148XXA Other injury of unspecified body region, initial encounter: Secondary | ICD-10-CM | POA: Diagnosis not present

## 2020-10-22 DIAGNOSIS — H02831 Dermatochalasis of right upper eyelid: Secondary | ICD-10-CM | POA: Diagnosis not present

## 2020-10-22 DIAGNOSIS — H02834 Dermatochalasis of left upper eyelid: Secondary | ICD-10-CM | POA: Diagnosis not present

## 2020-10-22 DIAGNOSIS — H57813 Brow ptosis, bilateral: Secondary | ICD-10-CM | POA: Diagnosis not present

## 2020-10-22 DIAGNOSIS — D485 Neoplasm of uncertain behavior of skin: Secondary | ICD-10-CM | POA: Diagnosis not present

## 2020-10-29 ENCOUNTER — Ambulatory Visit: Payer: Medicare Other | Admitting: Nurse Practitioner

## 2020-10-31 ENCOUNTER — Telehealth: Payer: Self-pay | Admitting: Nurse Practitioner

## 2020-10-31 NOTE — Telephone Encounter (Signed)
Left message for patient to call back and schedule Medicare Annual Wellness Visit (AWV). Please offer to do virtually or by telephone.  Left office number and my jabber #[phone]. ? ?Due for AWVI ? ?Please schedule at anytime with Nurse Health Advisor. ?  ?

## 2020-11-14 DIAGNOSIS — L821 Other seborrheic keratosis: Secondary | ICD-10-CM | POA: Diagnosis not present

## 2020-11-14 DIAGNOSIS — D485 Neoplasm of uncertain behavior of skin: Secondary | ICD-10-CM | POA: Diagnosis not present

## 2020-12-03 ENCOUNTER — Other Ambulatory Visit: Payer: Self-pay | Admitting: Nurse Practitioner

## 2020-12-03 ENCOUNTER — Ambulatory Visit (HOSPITAL_COMMUNITY)
Admission: RE | Admit: 2020-12-03 | Discharge: 2020-12-03 | Disposition: A | Discharge status: Home / Self Care | Payer: Medicare Other | Source: Ambulatory Visit | Attending: Nurse Practitioner | Admitting: Nurse Practitioner

## 2020-12-03 ENCOUNTER — Other Ambulatory Visit: Payer: Self-pay

## 2020-12-03 DIAGNOSIS — I82622 Acute embolism and thrombosis of deep veins of left upper extremity: Secondary | ICD-10-CM | POA: Insufficient documentation

## 2020-12-04 MED ORDER — APIXABAN 5 MG PO TABS: 5.0000 mg | ORAL_TABLET | Freq: Two times a day (BID) | ORAL | 2 refills | Status: DC

## 2020-12-16 ENCOUNTER — Other Ambulatory Visit: Payer: Self-pay | Admitting: Nurse Practitioner

## 2020-12-16 DIAGNOSIS — I82622 Acute embolism and thrombosis of deep veins of left upper extremity: Secondary | ICD-10-CM

## 2020-12-24 ENCOUNTER — Encounter: Payer: Self-pay | Admitting: Infectious Disease

## 2020-12-24 ENCOUNTER — Other Ambulatory Visit: Payer: Self-pay

## 2020-12-24 ENCOUNTER — Ambulatory Visit (INDEPENDENT_AMBULATORY_CARE_PROVIDER_SITE_OTHER): Payer: Medicare Other | Admitting: Infectious Disease

## 2020-12-24 ENCOUNTER — Ambulatory Visit (INDEPENDENT_AMBULATORY_CARE_PROVIDER_SITE_OTHER): Payer: Medicare Other

## 2020-12-24 VITALS — BP 148/80 | HR 51 | Temp 97.8°F | Resp 16 | Wt 329.0 lb

## 2020-12-24 DIAGNOSIS — J8281 Chronic eosinophilic pneumonia: Secondary | ICD-10-CM

## 2020-12-24 DIAGNOSIS — Z96649 Presence of unspecified artificial hip joint: Secondary | ICD-10-CM

## 2020-12-24 DIAGNOSIS — I82622 Acute embolism and thrombosis of deep veins of left upper extremity: Secondary | ICD-10-CM | POA: Diagnosis not present

## 2020-12-24 DIAGNOSIS — Z23 Encounter for immunization: Secondary | ICD-10-CM

## 2020-12-24 DIAGNOSIS — T8459XD Infection and inflammatory reaction due to other internal joint prosthesis, subsequent encounter: Secondary | ICD-10-CM | POA: Diagnosis not present

## 2020-12-24 NOTE — Progress Notes (Signed)
   Covid-19 Vaccination Clinic  Name:  SERIGNE KUBICEK    MRN: [id number] DOB: [birth year]  12/24/2020  Mr. Fish was observed post Covid-19 immunization for 15 minutes without incident. He was provided with Vaccine Information Sheet and instruction to access the V-Safe system.   Mr. Levi was instructed to call 911 with any severe reactions post vaccine: Difficulty breathing  Swelling of face and throat  A fast heartbeat  A bad rash all over body  Dizziness and weakness   Immunizations Administered     Name Date Dose VIS Date Route   Pfizer Covid-19 Vaccine Bivalent Booster 12/24/2020 10:39 AM 0.3 mL 10/10/2020 Intramuscular   Manufacturer: [place]   Lot: [id number]   [place]: [place] Carvell Hoeffner, CMA

## 2020-12-24 NOTE — Progress Notes (Signed)
Subjective:  Chief complaint f problems with abducting his left leg patient ID: Jacob Mitchell, male    DOB: [birth year], 75 y.o.   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  Unfortunately he developed eosinophilic pneumonia (though we never prove this with biopsy clinically he had this) severe enough that he was admitted to the ICU and placed on BiPAP and nearly had to be intubated.  Fortunately he responded promptly to corticosteroid therapy in the interim his IV antibiotics have been changed to vancomycin and cefepime for possible bacterial lobe Multi lobar pneumonia.  He ultimately was changed over to oral doxycycline and cefdinir to provide coverage of his left prosthetic hip given that no organism was ever isolated.  He has seen Dr. Kara Mead with lobe our pulmonary critical care medicine for follow-up who is happy with his progress and told him he can come back as needed.  The patient is continuing his corticosteroid taper to eventually get off this medication.  When I last saw Jacob Mitchell he continued to have persistent edema in his arm he ordered Doppler which found deep venous thromboses not seen on duplex scanning while he was in the hospital he has been on anticoagulation since then.  He had a recent duplex in October that shows the chronic DVTs but also an acute superficial thrombus.  He says he has no pain in his hip whatsoever.  He does however have an inability to abduct his hip especially is lying on his right side.  Inflammatory markers have been reassuringly normal and as is his lack of pain.    Past Medical History:  Diagnosis Date   Benign localized prostatic hyperplasia with lower urinary tract symptoms (LUTS)    Complication of anesthesia    pt says he gets "restless" when he is waking up and nurses have had to "hold him down"- happened several  surgeries ago    Diverticulosis of colon    History of colon polyps    History of kidney stones    History of MRSA infection 2007   post right shoulder surgery   History of urinary retention 05/31/2017   due to BPH   Incomplete emptying of bladder    Left arm swelling 08/21/2020   OA (osteoarthritis)    Peripheral neuropathy    feet-- hx frost bite   Pinched nerve in neck    right index and middle fingers numbness   Wears glasses    Wears hearing aid in both ears     Past Surgical History:  Procedure Laterality Date   CATARACT EXTRACTION Left 01/13/2020   COLONOSCOPY     CYST REMOVAL LEG Right 1975   CYSTOSCOPY WITH INSERTION OF UROLIFT N/A 07/20/2017   Procedure: CYSTOSCOPY WITH INSERTION OF UROLIFT;  Surgeon: Cleon Gustin, MD;  Location: [facility];  Service: Urology;  Laterality: N/A;   CYSTOSCOPY WITH INSERTION OF UROLIFT     DISTAL CLAVICLE EXCISION Left 1977   left shoulder   ELBOW SURGERY Right 1969;  1980   HAND SURGERY Right 1968   INCISION AND DRAINAGE HIP Left 05/01/2020   Procedure: IRRIGATION AND DEBRIDEMENT LEFT HIP SEROMA;  Surgeon: Paralee Cancel, MD;  Location: [facility];  Service: Orthopedics;  Laterality: Left;  90 mins   LAMINECTOMY  1970   L4-5   LUMBAR FUSION  2013  approx.  L1--L5   ORIF HIP FRACTURE Left 01/17/2020   Procedure: OPEN REDUCTION INTERNAL FIXATION LEFT HIP GREATER TROCHANTER FRACTURE;  Surgeon: Paralee Cancel, MD;  Location: [facility];  Service: Orthopedics;  Laterality: Left;  90 MINS   ORIF PERIPROSTHETIC FRACTURE Left 02/27/2020   Procedure: OPEN REDUCTION INTERNAL FIXATION (ORIF) LEFT PERI-PROSTETIC  HIP FRACTURE;  Surgeon: Paralee Cancel, MD;  Location: [facility];  Service: Orthopedics;  Laterality: Left;  40mins   ROTATOR CUFF REPAIR Right x5    last one 2007   5 surgeries in 9 days--- original repair then 4 sx'  I&D sx's for MRSA infection   SHOULDER ARTHROSCOPY WITH DISTAL CLAVICLE RESECTION Left 1977   SHOULDER SURGERY  Left 1976   TOTAL HIP ARTHROPLASTY Left 11/23/2015   Procedure: LEFT TOTAL HIP ARTHROPLASTY ANTERIOR APPROACH;  Surgeon: Paralee Cancel, MD;  Location: [facility];  Service: Orthopedics;  Laterality: Left;   TOTAL HIP REVISION     10/14/16 Dr. Alvan Dame   TOTAL HIP REVISION Left 10/14/2016   Procedure: TOTAL HIP REVISION POSTERIOR APPROACH;  Surgeon: Paralee Cancel, MD;  Location: [facility];  Service: Orthopedics;  Laterality: Left; (total of 4 hip surgeries in all)    Family History  Problem Relation Age of Onset   Alcohol abuse Father    Drug abuse Brother       Social History   Socioeconomic History   Marital status: Married    Spouse name: Not on file   Number of children: Not on file   Years of education: Not on file   Highest education level: Not on file  Occupational History   Not on file  Tobacco Use   Smoking status: Never   Smokeless tobacco: Never  Vaping Use   Vaping Use: Never used  Substance and Sexual Activity   Alcohol use: No   Drug use: No   Sexual activity: Yes  Other Topics Concern   Not on file  Social History Narrative   Not on file   Social Determinants of Health   Financial Resource Strain: Not on file  Food Insecurity: Not on file  Transportation Needs: Not on file  Physical Activity: Not on file  Stress: Not on file  Social Connections: Not on file    Allergies  Allergen Reactions   Aspartame And Phenylalanine Palpitations    Artificial sweetener- hrt 140  went to ED   Daptomycin Other (See Comments)    Presumed Eosinophilic pneumonia     Current Outpatient Medications:    acetaminophen (TYLENOL) 500 MG tablet, Take 1,000-1,500 mg by mouth every 6 (six) hours as needed for mild pain., Disp: , Rfl:    apixaban (ELIQUIS) 5 MG TABS tablet, Take 1 tablet (5 mg total) by mouth 2 (two) times daily., Disp: 60 tablet, Rfl: 2   cefdinir (OMNICEF) 300 MG capsule, Take 1 capsule (300 mg total) by mouth every 12 (twelve) hours., Disp: 60 capsule, Rfl: 11    doxycycline (VIBRA-TABS) 100 MG tablet, Take 1 tablet (100 mg total) by mouth every 12 (twelve) hours., Disp: 60 tablet, Rfl: 11   MELATONIN PO, Take 1 tablet by mouth at bedtime as needed (sleep). (Patient not taking: Reported on 12/24/2020), Disp: , Rfl:    traZODone (DESYREL) 100 MG tablet, Take 1 tablet (100 mg total) by mouth at bedtime. (Patient not taking: Reported on 12/24/2020), Disp: 28 tablet, Rfl: 0   Review of Systems  Constitutional:  Negative for activity change, appetite change, chills, diaphoresis, fatigue, fever and unexpected  weight change.  HENT:  Negative for congestion, rhinorrhea, sinus pressure, sneezing, sore throat and trouble swallowing.   Eyes:  Negative for photophobia and visual disturbance.  Respiratory:  Negative for cough, chest tightness, shortness of breath, wheezing and stridor.   Cardiovascular:  Negative for chest pain, palpitations and leg swelling.  Gastrointestinal:  Negative for abdominal distention, abdominal pain, anal bleeding, blood in stool, constipation, diarrhea, nausea and vomiting.  Genitourinary:  Negative for difficulty urinating, dysuria, flank pain and hematuria.  Musculoskeletal:  Positive for gait problem. Negative for arthralgias, back pain, joint swelling and myalgias.  Skin:  Negative for color change, pallor, rash and wound.  Neurological:  Negative for dizziness, tremors, weakness and light-headedness.  Hematological:  Negative for adenopathy. Does not bruise/bleed easily.  Psychiatric/Behavioral:  Negative for agitation, behavioral problems, confusion, decreased concentration, dysphoric mood and sleep disturbance.       Objective:   Physical Exam Constitutional:      Appearance: He is well-developed.  HENT:     Head: Normocephalic and atraumatic.  Eyes:     Conjunctiva/sclera: Conjunctivae normal.  Cardiovascular:     Rate and Rhythm: Normal rate and regular rhythm.  Pulmonary:     Effort: Pulmonary effort is normal. No  respiratory distress.     Breath sounds: No wheezing.  Abdominal:     General: There is no distension.     Palpations: Abdomen is soft.  Musculoskeletal:        General: No tenderness. Normal range of motion.     Cervical back: Normal range of motion and neck supple.  Skin:    General: Skin is warm and dry.     Coloration: Skin is not pale.     Findings: No erythema or rash.  Neurological:     General: No focal deficit present.     Mental Status: He is alert and oriented to person, place, and time.  Psychiatric:        Mood and Affect: Mood normal.        Behavior: Behavior normal.        Thought Content: Thought content normal.        Judgment: Judgment normal.         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  I am comfortable having him come off antibiotics but I will check a sed rate CRP BMP and CBC with differential today.  Provided these are reassuring we will monitor him off antibiotics and see him back in 3 months time.  Eosinophilic pneumonia: Resolved  Deep venous thrombosis on Eliquis, reviewed his recent duplex was also shows an acute thrombus.  Primary care is handling this.  Seen counseling recommended to get high-dose flu vaccine as well as updated COVID-19 booster which he received.

## 2020-12-25 ENCOUNTER — Telehealth: Payer: Self-pay

## 2020-12-25 LAB — BASIC METABOLIC PANEL WITH GFR
BUN/Creatinine Ratio: 20 (calc) (ref 6–22)
BUN: 27 mg/dL — ABNORMAL HIGH (ref 7–25)
CO2: 21 mmol/L (ref 20–32)
Calcium: 9.3 mg/dL (ref 8.6–10.3)
Chloride: 107 mmol/L (ref 98–110)
Creat: 1.38 mg/dL — ABNORMAL HIGH (ref 0.70–1.28)
Glucose, Bld: 94 mg/dL (ref 65–99)
Potassium: 4.8 mmol/L (ref 3.5–5.3)
Sodium: 138 mmol/L (ref 135–146)
eGFR: 53 mL/min/{1.73_m2} — ABNORMAL LOW (ref >=60)

## 2020-12-25 LAB — SEDIMENTATION RATE: Sed Rate: 2 mm/h (ref 0–20)

## 2020-12-25 LAB — C-REACTIVE PROTEIN: CRP: 5.3 mg/L (ref <8.0)

## 2020-12-25 LAB — CBC WITH DIFFERENTIAL/PLATELET
Absolute Monocytes: 680 cells/uL (ref 200–950)
Basophils Absolute: 63 cells/uL (ref 0–200)
Basophils Relative: 1 %
Eosinophils Absolute: 302 cells/uL (ref 15–500)
Eosinophils Relative: 4.8 %
HCT: 45.8 % (ref 38.5–50.0)
Hemoglobin: 14.5 g/dL (ref 13.2–17.1)
Lymphs Abs: 1172 cells/uL (ref 850–3900)
MCH: 27.8 pg (ref 27.0–33.0)
MCHC: 31.7 g/dL — ABNORMAL LOW (ref 32.0–36.0)
MCV: 87.9 fL (ref 80.0–100.0)
MPV: 10.6 fL (ref 7.5–12.5)
Monocytes Relative: 10.8 %
Neutro Abs: 4082 cells/uL (ref 1500–7800)
Neutrophils Relative %: 64.8 %
Platelets: 222 10*3/uL (ref 140–400)
RBC: 5.21 10*6/uL (ref 4.20–5.80)
RDW: 14.3 % (ref 11.0–15.0)
Total Lymphocyte: 18.6 %
WBC: 6.3 10*3/uL (ref 3.8–10.8)

## 2020-12-25 NOTE — Telephone Encounter (Signed)
-----   Message from Truman Hayward, MD sent at 12/25/2020  9:53 AM EST ----- Labs are reassuring, Afshin can come off his antibiotics and followup with me as scheduled ----- Message ----- From: Interface, [facility] Lab Results In Sent: 12/24/2020  11:02 PM EST To: Truman Hayward, MD

## 2020-12-25 NOTE — Telephone Encounter (Signed)
Called patient, he was unavailable. Relayed to his wife, who accompanies him to his appointments, that his labs are reassuring and Dr. Tommy Medal says it's okay for him to come off his antibiotics. Reminded her of patient's follow up in February.   Beryle Flock, RN

## 2021-01-09 NOTE — Telephone Encounter (Signed)
Chart supports Rx  Last seen 10/2020 No future appointments scheduled.

## 2021-03-26 NOTE — Progress Notes (Signed)
Subjective:  Chief complaint : Follow-up for prosthetic hip infection  patient ID: Jacob Mitchell, male    DOB: 22-Sep-1945, 76 y.o.   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  Unfortunately he developed eosinophilic pneumonia (though we never prove this with biopsy clinically he had this) severe enough that he was admitted to the ICU and placed on BiPAP and nearly had to be intubated.  Fortunately he responded promptly to corticosteroid therapy in the interim his IV antibiotics have been changed to vancomycin and cefepime for possible bacterial lobe Multi lobar pneumonia.  He ultimately was changed over to oral doxycycline and cefdinir to provide coverage of his left prosthetic hip given that no organism was ever isolated.  He has seen Dr. Kara Mead with lobe our pulmonary critical care medicine for follow-up who is happy with his progress and told him he can come back as needed.  The patient is continuing his corticosteroid taper to eventually get off this medication.  When I last saw Jacob Mitchell he continued to have persistent edema in his arm he ordered Doppler which found deep venous thromboses not seen on duplex scanning while he was in the hospital he has been on anticoagulation since then.  He had a recent duplex in October that shows the chronic DVTs but also an acute superficial thrombus.  He says he has no pain in his hip whatsoever.  He does however have an inability to abduct his hip especially is lying on his right side.  Inflammatory markers have been reassuringly normal and as has been his lack of pain.  At his last visit we stopped his antibiotics and he has now been off antibiotics for at least 3 months.  He has had no recurrence of pain whatsoever.  He is not as happy with how this prosthesis allows him to walk but otherwise he is not having new  complaints.     Past Medical History:  Diagnosis Date   Benign localized prostatic hyperplasia with lower urinary tract symptoms (LUTS)    Complication of anesthesia    pt says he gets "restless" when he is waking up and nurses have had to "hold him down"- happened several surgeries ago    Diverticulosis of colon    History of colon polyps    History of kidney stones    History of MRSA infection 2007   post right shoulder surgery   History of urinary retention 05/31/2017   due to BPH   Incomplete emptying of bladder    Left arm swelling 08/21/2020   OA (osteoarthritis)    Peripheral neuropathy    feet-- hx frost bite   Pinched nerve in neck    right index and middle fingers numbness   Wears glasses    Wears hearing aid in both ears     Past Surgical History:  Procedure Laterality Date   CATARACT EXTRACTION Left 01/13/2020   COLONOSCOPY     CYST REMOVAL LEG Right 1975   CYSTOSCOPY WITH INSERTION OF UROLIFT N/A 07/20/2017   Procedure: CYSTOSCOPY WITH INSERTION OF UROLIFT;  Surgeon: Cleon Gustin, MD;  Location: [facility];  Service: Urology;  Laterality: N/A;   CYSTOSCOPY WITH INSERTION OF UROLIFT     DISTAL CLAVICLE EXCISION Left 1977   left shoulder   ELBOW SURGERY Right 1969;  1980   HAND SURGERY  Right 1968   INCISION AND DRAINAGE HIP Left 05/01/2020   Procedure: IRRIGATION AND DEBRIDEMENT LEFT HIP SEROMA;  Surgeon: Paralee Cancel, MD;  Location: [facility];  Service: Orthopedics;  Laterality: Left;  90 mins   LAMINECTOMY  1970   L4-5   LUMBAR FUSION  2013  approx.   L1--L5   ORIF HIP FRACTURE Left 01/17/2020   Procedure: OPEN REDUCTION INTERNAL FIXATION LEFT HIP GREATER TROCHANTER FRACTURE;  Surgeon: Paralee Cancel, MD;  Location: [facility];  Service: Orthopedics;  Laterality: Left;  90 MINS   ORIF PERIPROSTHETIC FRACTURE Left 02/27/2020   Procedure: OPEN REDUCTION INTERNAL FIXATION (ORIF) LEFT PERI-PROSTETIC  HIP FRACTURE;  Surgeon: Paralee Cancel, MD;   Location: [facility];  Service: Orthopedics;  Laterality: Left;  20mins   ROTATOR CUFF REPAIR Right x5    last one 2007   5 surgeries in 9 days--- original repair then 4 sx'  I&D sx's for MRSA infection   SHOULDER ARTHROSCOPY WITH DISTAL CLAVICLE RESECTION Left 1977   SHOULDER SURGERY Left 1976   TOTAL HIP ARTHROPLASTY Left 11/23/2015   Procedure: LEFT TOTAL HIP ARTHROPLASTY ANTERIOR APPROACH;  Surgeon: Paralee Cancel, MD;  Location: [facility];  Service: Orthopedics;  Laterality: Left;   TOTAL HIP REVISION     10/14/16 Dr. Alvan Dame   TOTAL HIP REVISION Left 10/14/2016   Procedure: TOTAL HIP REVISION POSTERIOR APPROACH;  Surgeon: Paralee Cancel, MD;  Location: [facility];  Service: Orthopedics;  Laterality: Left; (total of 4 hip surgeries in all)    Family History  Problem Relation Age of Onset   Alcohol abuse Father    Drug abuse Brother       Social History   Socioeconomic History   Marital status: Married    Spouse name: Not on file   Number of children: Not on file   Years of education: Not on file   Highest education level: Not on file  Occupational History   Not on file  Tobacco Use   Smoking status: Never   Smokeless tobacco: Never  Vaping Use   Vaping Use: Never used  Substance and Sexual Activity   Alcohol use: No   Drug use: No   Sexual activity: Yes  Other Topics Concern   Not on file  Social History Narrative   Not on file   Social Determinants of Health   Financial Resource Strain: Not on file  Food Insecurity: Not on file  Transportation Needs: Not on file  Physical Activity: Not on file  Stress: Not on file  Social Connections: Not on file    Allergies  Allergen Reactions   Aspartame And Phenylalanine Palpitations    Artificial sweetener- hrt 140  went to ED   Daptomycin Other (See Comments)    Presumed Eosinophilic pneumonia     Current Outpatient Medications:    acetaminophen (TYLENOL) 500 MG tablet, Take 1,000-1,500 mg by mouth every 6 (six) hours as needed  for mild pain., Disp: , Rfl:    ELIQUIS 5 MG TABS tablet, TAKE ONE TABLET BY MOUTH TWICE A DAY, Disp: 60 tablet, Rfl: 2   MELATONIN PO, Take 1 tablet by mouth at bedtime as needed (sleep). (Patient not taking: Reported on 12/24/2020), Disp: , Rfl:    traZODone (DESYREL) 100 MG tablet, Take 1 tablet (100 mg total) by mouth at bedtime. (Patient not taking: Reported on 12/24/2020), Disp: 28 tablet, Rfl: 0   Review of Systems  Constitutional:  Negative for activity change, appetite change, chills, diaphoresis, fatigue, fever  and unexpected weight change.  HENT:  Negative for congestion, rhinorrhea, sinus pressure, sneezing, sore throat and trouble swallowing.   Eyes:  Negative for photophobia and visual disturbance.  Respiratory:  Negative for cough, chest tightness, shortness of breath, wheezing and stridor.   Cardiovascular:  Negative for chest pain, palpitations and leg swelling.  Gastrointestinal:  Negative for abdominal distention, abdominal pain, anal bleeding, blood in stool, constipation, diarrhea, nausea and vomiting.  Genitourinary:  Negative for difficulty urinating, dysuria, flank pain and hematuria.  Musculoskeletal:  Negative for arthralgias, back pain, gait problem, joint swelling and myalgias.  Skin:  Negative for color change, pallor, rash and wound.  Neurological:  Negative for dizziness, tremors, weakness and light-headedness.  Hematological:  Negative for adenopathy. Does not bruise/bleed easily.  Psychiatric/Behavioral:  Negative for agitation, behavioral problems, confusion, decreased concentration, dysphoric mood and sleep disturbance.       Objective:   Physical Exam Constitutional:      Appearance: He is well-developed.  HENT:     Head: Normocephalic and atraumatic.  Eyes:     Conjunctiva/sclera: Conjunctivae normal.  Cardiovascular:     Rate and Rhythm: Normal rate and regular rhythm.  Pulmonary:     Effort: Pulmonary effort is normal. No respiratory distress.      Breath sounds: No wheezing.  Abdominal:     General: There is no distension.     Palpations: Abdomen is soft.  Musculoskeletal:        General: No tenderness. Normal range of motion.     Cervical back: Normal range of motion and neck supple.  Skin:    General: Skin is warm and dry.     Coloration: Skin is not pale.     Findings: No erythema or rash.  Neurological:     General: No focal deficit present.     Mental Status: He is alert and oriented to person, place, and time.  Psychiatric:        Mood and Affect: Mood normal.        Behavior: Behavior normal.        Thought Content: Thought content normal.        Judgment: Judgment normal.         Assessment & Plan:  Presumed prosthetic joint infection:  I think we have cured this we will check sed rate CRP CBC BMP with GFR today  He will follow-up as needed unless his inflammatory markers are elevated in which case we will have him come back and see [place] again in a few months.  : Eosinophilic pneumonia: Resolved  DVT: He is currently on anticoagulation would like PCP to address duration but I would expect it to be 6 months in total.

## 2021-03-27 ENCOUNTER — Ambulatory Visit (INDEPENDENT_AMBULATORY_CARE_PROVIDER_SITE_OTHER): Payer: Medicare Other | Admitting: Infectious Disease

## 2021-03-27 ENCOUNTER — Other Ambulatory Visit: Payer: Self-pay

## 2021-03-27 ENCOUNTER — Encounter: Payer: Self-pay | Admitting: Infectious Disease

## 2021-03-27 VITALS — BP 131/80 | HR 64 | Temp 97.7°F | Ht 79.0 in | Wt 325.0 lb

## 2021-03-27 DIAGNOSIS — J8281 Chronic eosinophilic pneumonia: Secondary | ICD-10-CM

## 2021-03-27 DIAGNOSIS — T8459XD Infection and inflammatory reaction due to other internal joint prosthesis, subsequent encounter: Secondary | ICD-10-CM | POA: Diagnosis not present

## 2021-03-27 DIAGNOSIS — Z96649 Presence of unspecified artificial hip joint: Secondary | ICD-10-CM | POA: Diagnosis not present

## 2021-03-27 DIAGNOSIS — I82622 Acute embolism and thrombosis of deep veins of left upper extremity: Secondary | ICD-10-CM | POA: Diagnosis not present

## 2021-03-27 HISTORY — DX: Acute embolism and thrombosis of deep veins of left upper extremity: I82.622

## 2021-03-28 LAB — CBC WITH DIFFERENTIAL/PLATELET
Absolute Monocytes: 684 cells/uL (ref 200–950)
Basophils Absolute: 71 cells/uL (ref 0–200)
Basophils Relative: 1.2 %
Eosinophils Absolute: 277 cells/uL (ref 15–500)
Eosinophils Relative: 4.7 %
HCT: 48 % (ref 38.5–50.0)
Hemoglobin: 15.7 g/dL (ref 13.2–17.1)
Lymphs Abs: 1392 cells/uL (ref 850–3900)
MCH: 28.5 pg (ref 27.0–33.0)
MCHC: 32.7 g/dL (ref 32.0–36.0)
MCV: 87.3 fL (ref 80.0–100.0)
MPV: 10.3 fL (ref 7.5–12.5)
Monocytes Relative: 11.6 %
Neutro Abs: 3475 cells/uL (ref 1500–7800)
Neutrophils Relative %: 58.9 %
Platelets: 202 10*3/uL (ref 140–400)
RBC: 5.5 10*6/uL (ref 4.20–5.80)
RDW: 13.4 % (ref 11.0–15.0)
Total Lymphocyte: 23.6 %
WBC: 5.9 10*3/uL (ref 3.8–10.8)

## 2021-03-28 LAB — BASIC METABOLIC PANEL WITH GFR
BUN/Creatinine Ratio: 17 (calc) (ref 6–22)
BUN: 23 mg/dL (ref 7–25)
CO2: 25 mmol/L (ref 20–32)
Calcium: 9.4 mg/dL (ref 8.6–10.3)
Chloride: 107 mmol/L (ref 98–110)
Creat: 1.38 mg/dL — ABNORMAL HIGH (ref 0.70–1.28)
Glucose, Bld: 94 mg/dL (ref 65–99)
Potassium: 4.6 mmol/L (ref 3.5–5.3)
Sodium: 138 mmol/L (ref 135–146)
eGFR: 53 mL/min/{1.73_m2} — ABNORMAL LOW (ref >=60)

## 2021-03-28 LAB — SEDIMENTATION RATE: Sed Rate: 2 mm/h (ref 0–20)

## 2021-03-28 LAB — C-REACTIVE PROTEIN: CRP: 2.6 mg/L (ref <8.0)

## 2021-04-19 DIAGNOSIS — Z96642 Presence of left artificial hip joint: Secondary | ICD-10-CM | POA: Diagnosis not present

## 2021-05-30 ENCOUNTER — Ambulatory Visit: Payer: Medicare Other | Admitting: Family Medicine

## 2021-06-24 ENCOUNTER — Ambulatory Visit (INDEPENDENT_AMBULATORY_CARE_PROVIDER_SITE_OTHER): Payer: Medicare Other | Admitting: Family Medicine

## 2021-06-24 ENCOUNTER — Encounter: Payer: Self-pay | Admitting: Family Medicine

## 2021-06-24 VITALS — BP 128/77 | HR 61 | Ht 79.0 in | Wt 324.1 lb

## 2021-06-24 DIAGNOSIS — G629 Polyneuropathy, unspecified: Secondary | ICD-10-CM | POA: Diagnosis not present

## 2021-06-24 DIAGNOSIS — Z13220 Encounter for screening for lipoid disorders: Secondary | ICD-10-CM | POA: Diagnosis not present

## 2021-06-24 DIAGNOSIS — E7800 Pure hypercholesterolemia, unspecified: Secondary | ICD-10-CM | POA: Diagnosis not present

## 2021-06-24 DIAGNOSIS — I82622 Acute embolism and thrombosis of deep veins of left upper extremity: Secondary | ICD-10-CM | POA: Diagnosis not present

## 2021-06-24 NOTE — Progress Notes (Signed)
?Jacob Mitchell - 76 y.o. male MRN [id number]  Date of birth: 09-09-1945 ? ?Subjective ?Chief Complaint  ?Patient presents with  ? Establish Care  ? ? ?HPI ?Jacob Mitchell is a 76 y.o. male here today for initial visit to establish care.  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  Medically he has been pretty healthy.  He did have a DVT provoked by PICC line last year.   He was receiving antibiotics for a infected hip prosthesis.  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   ? ?He continues to walk regularly for exercise.  No chest pain or dyspnea wit this.  He would like to have updated labs completed today.  ? ?ROS:  A comprehensive ROS was completed and negative except as noted per HPI ? ?Allergies  ?Allergen Reactions  ? Aspartame And Phenylalanine Palpitations  ?  Artificial sweetener- hrt 140  went to ED  ? Daptomycin Other (See Comments)  ?  Presumed Eosinophilic pneumonia  ? ? ?Past Medical History:  ?Diagnosis Date  ? Arm DVT (deep venous thromboembolism), acute, left ([place]) 03/27/2021  ? Benign localized prostatic hyperplasia with lower urinary tract symptoms (LUTS)   ? Complication of anesthesia   ? pt says he gets "restless" when he is waking up and nurses have had to "hold him down"- happened several surgeries ago   ? Diverticulosis of colon   ? History of colon polyps   ? History of kidney stones   ? History of MRSA infection 2007  ? post right shoulder surgery  ? History of urinary retention 05/31/2017  ? due to BPH  ? Incomplete emptying of bladder   ? Left arm swelling 08/21/2020  ? OA (osteoarthritis)   ? Peripheral neuropathy   ? feet-- hx frost bite  ? Pinched nerve in neck   ? right index and middle fingers numbness  ? Wears glasses   ? Wears hearing aid in both ears   ? ? ?Past Surgical History:  ?Procedure Laterality Date  ? CATARACT EXTRACTION Left 01/13/2020  ? COLONOSCOPY    ? CYST REMOVAL LEG Right  1975  ? CYSTOSCOPY WITH INSERTION OF UROLIFT N/A 07/20/2017  ? Procedure: CYSTOSCOPY WITH INSERTION OF UROLIFT;  Surgeon: Cleon Gustin, MD;  Location[facility];  Service: Urology;  Laterality: N/A;  ? CYSTOSCOPY WITH INSERTION OF UROLIFT    ? DISTAL CLAVICLE EXCISION Left 1977  ? left shoulder  ? ELBOW SURGERY Right 1969;  1980  ? HAND SURGERY Right 1968  ? INCISION AND DRAINAGE HIP Left 05/01/2020  ? Procedure: IRRIGATION AND DEBRIDEMENT LEFT HIP SEROMA;  Surgeon: Paralee Cancel, MD;  Location: [facility];  Service: Orthopedics;  Laterality: Left;  90 mins  ? LAMINECTOMY  1970  ? L4-5  ? LUMBAR FUSION  2013  approx.  ? L1--L5  ? ORIF HIP FRACTURE Left 01/17/2020  ? Procedure: OPEN REDUCTION INTERNAL FIXATION LEFT HIP GREATER TROCHANTER FRACTURE;  Surgeon: Paralee Cancel, MD;  Location: [facility];  Service: Orthopedics;  Laterality: Left;  90 MINS  ? ORIF PERIPROSTHETIC FRACTURE Left 02/27/2020  ? Procedure: OPEN REDUCTION INTERNAL FIXATION (ORIF) LEFT PERI-PROSTETIC  HIP FRACTURE;  Surgeon: Paralee Cancel, MD;  Location: [facility];  Service: Orthopedics;  Laterality: Left;  64mns  ? ROTATOR CUFF REPAIR Right x5    last one 2007  ? 5 surgeries in 9 days---  original repair then 4 sx'  I&D sx's for MRSA infection  ? SHOULDER ARTHROSCOPY WITH DISTAL CLAVICLE RESECTION Left 1977  ? SHOULDER SURGERY Left 1976  ? TOTAL HIP ARTHROPLASTY Left 11/23/2015  ? Procedure: LEFT TOTAL HIP ARTHROPLASTY ANTERIOR APPROACH;  Surgeon: Paralee Cancel, MD;  Location: [facility];  Service: Orthopedics;  Laterality: Left;  ? TOTAL HIP REVISION    ? 10/14/16 Dr. Alvan Dame  ? TOTAL HIP REVISION Left 10/14/2016  ? Procedure: TOTAL HIP REVISION POSTERIOR APPROACH;  Surgeon: Paralee Cancel, MD;  Location: [facility];  Service: Orthopedics;  Laterality: Left; (total of 4 hip surgeries in all)  ? ? ?Social History  ? ?Socioeconomic History  ? Marital status: Married  ?  Spouse name: Not on file  ? Number of children: Not on file  ? Years of education: Not  on file  ? Highest education level: Not on file  ?Occupational History  ? Not on file  ?Tobacco Use  ? Smoking status: Never  ? Smokeless tobacco: Never  ?Vaping Use  ? Vaping Use: Never used  ?Substance and Sexual Activity  ? Alcohol use: No  ? Drug use: No  ? Sexual activity: Yes  ?Other Topics Concern  ? Not on file  ?Social History Narrative  ? Not on file  ? ?Social Determinants of Health  ? ?Financial Resource Strain: Not on file  ?Food Insecurity: Not on file  ?Transportation Needs: Not on file  ?Physical Activity: Not on file  ?Stress: Not on file  ?Social Connections: Not on file  ? ? ?Family History  ?Problem Relation Age of Onset  ? Alcohol abuse Father   ? Drug abuse Brother   ? ? ?Health Maintenance  ?Topic Date Due  ? Hepatitis C Screening  Never done  ? TETANUS/TDAP  Never done  ? Zoster Vaccines- Shingrix (1 of 2) Never done  ? Pneumonia Vaccine 51+ Years old (1 - PCV) Never done  ? COVID-19 Vaccine (2 - Pfizer risk series) 01/14/2021  ? INFLUENZA VACCINE  09/10/2021  ? HPV VACCINES  Aged Out  ? ? ? ?----------------------------------------------------------------------------------------------------------------------------------------------------------------------------------------------------------------- ?Physical Exam ?BP (!) 157/93 (BP Location: Left Arm, Patient Position: Sitting, Cuff Size: Large)   Pulse 60   Ht '6\' 7"'$  (2.007 m)   Wt (!) 324 lb 1.9 oz (147 kg)   SpO2 95%   BMI 36.51 kg/m?  ? ?Physical Exam ?Constitutional:   ?   Appearance: Normal appearance.  ?Eyes:  ?   General: No scleral icterus. ?Cardiovascular:  ?   Rate and Rhythm: Normal rate and regular rhythm.  ?Pulmonary:  ?   Effort: Pulmonary effort is normal.  ?   Breath sounds: Normal breath sounds.  ?Musculoskeletal:  ?   Cervical back: Neck supple.  ?Neurological:  ?   Mental Status: He is alert.  ?Psychiatric:     ?   Mood and Affect: Mood normal.     ?   Behavior: Behavior normal.   ? ? ?------------------------------------------------------------------------------------------------------------------------------------------------------------------------------------------------------------------- ?Assessment and Plan ? ?Arm DVT (deep venous thromboembolism), acute, left ([place]) ?History of provoked DVT.  Has completed nearly a year of eliquis.  No continued swelling and I think he can discontinue at this time.  ? ?Screening for lipid disorders ?Checking lipid panel.  ? ? ?No orders of the defined types were placed in this encounter. ? ? ?No follow-ups on file. ? ? ? ?This visit occurred during the SARS-CoV-2 public health emergency.  Safety protocols were in place, including screening questions prior  to the visit, additional usage of staff PPE, and extensive cleaning of exam room while observing appropriate contact time as indicated for disinfecting solutions.  ? ?

## 2021-06-24 NOTE — Assessment & Plan Note (Signed)
Checking lipid panel. 

## 2021-06-24 NOTE — Patient Instructions (Signed)
Great to see you today! ?You may STOP eliquis.  ?

## 2021-06-24 NOTE — Assessment & Plan Note (Signed)
History of provoked DVT.  Has completed nearly a year of eliquis.  No continued swelling and I think he can discontinue at this time.  ?

## 2021-06-25 LAB — CBC WITH DIFFERENTIAL/PLATELET
Absolute Monocytes: 595 cells/uL (ref 200–950)
Basophils Absolute: 68 cells/uL (ref 0–200)
Basophils Relative: 1.1 %
Eosinophils Absolute: 229 cells/uL (ref 15–500)
Eosinophils Relative: 3.7 %
HCT: 50.1 % — ABNORMAL HIGH (ref 38.5–50.0)
Hemoglobin: 16.4 g/dL (ref 13.2–17.1)
Lymphs Abs: 1259 cells/uL (ref 850–3900)
MCH: 29 pg (ref 27.0–33.0)
MCHC: 32.7 g/dL (ref 32.0–36.0)
MCV: 88.7 fL (ref 80.0–100.0)
MPV: 10.4 fL (ref 7.5–12.5)
Monocytes Relative: 9.6 %
Neutro Abs: 4049 cells/uL (ref 1500–7800)
Neutrophils Relative %: 65.3 %
Platelets: 199 10*3/uL (ref 140–400)
RBC: 5.65 10*6/uL (ref 4.20–5.80)
RDW: 13.8 % (ref 11.0–15.0)
Total Lymphocyte: 20.3 %
WBC: 6.2 10*3/uL (ref 3.8–10.8)

## 2021-06-25 LAB — COMPLETE METABOLIC PANEL WITH GFR
AG Ratio: 1.9 (calc) (ref 1.0–2.5)
ALT: 19 U/L (ref 9–46)
AST: 19 U/L (ref 10–35)
Albumin: 4.6 g/dL (ref 3.6–5.1)
Alkaline phosphatase (APISO): 97 U/L (ref 35–144)
BUN/Creatinine Ratio: 18 (calc) (ref 6–22)
BUN: 24 mg/dL (ref 7–25)
CO2: 23 mmol/L (ref 20–32)
Calcium: 9.5 mg/dL (ref 8.6–10.3)
Chloride: 106 mmol/L (ref 98–110)
Creat: 1.33 mg/dL — ABNORMAL HIGH (ref 0.70–1.28)
Globulin: 2.4 g/dL (calc) (ref 1.9–3.7)
Glucose, Bld: 89 mg/dL (ref 65–99)
Potassium: 4.7 mmol/L (ref 3.5–5.3)
Sodium: 140 mmol/L (ref 135–146)
Total Bilirubin: 0.9 mg/dL (ref 0.2–1.2)
Total Protein: 7 g/dL (ref 6.1–8.1)
eGFR: 55 mL/min/{1.73_m2} — ABNORMAL LOW (ref >=60)

## 2021-06-25 LAB — LIPID PANEL W/REFLEX DIRECT LDL
Cholesterol: 167 mg/dL (ref <200)
HDL: 37 mg/dL — ABNORMAL LOW (ref >=40)
LDL Cholesterol (Calc): 103 mg/dL (calc) — ABNORMAL HIGH
Non-HDL Cholesterol (Calc): 130 mg/dL (calc) — ABNORMAL HIGH (ref <130)
Total CHOL/HDL Ratio: 4.5 (calc) (ref <5.0)
Triglycerides: 156 mg/dL — ABNORMAL HIGH (ref <150)

## 2021-10-02 ENCOUNTER — Encounter: Payer: Self-pay | Admitting: General Practice

## 2022-02-19 DIAGNOSIS — Z96642 Presence of left artificial hip joint: Secondary | ICD-10-CM | POA: Diagnosis not present

## 2022-04-15 IMAGING — CT CT CHEST W/O CM
2 of 4 series · 15 of 36 positions shown, 18 images · non-contrast
Comparison: 05/31/2020

CLINICAL DATA: Follow-up pneumonia.

EXAM:
CT CHEST WITHOUT CONTRAST
TECHNIQUE: Multidetector CT imaging of the chest was performed following the
standard protocol without IV contrast.

[Series 2: thorax · axial · 0.91mm/px · z∈[+1472,+1778]mm · 12 of 181 slices shown, 15 images]
[im 14/181  mediastinal]
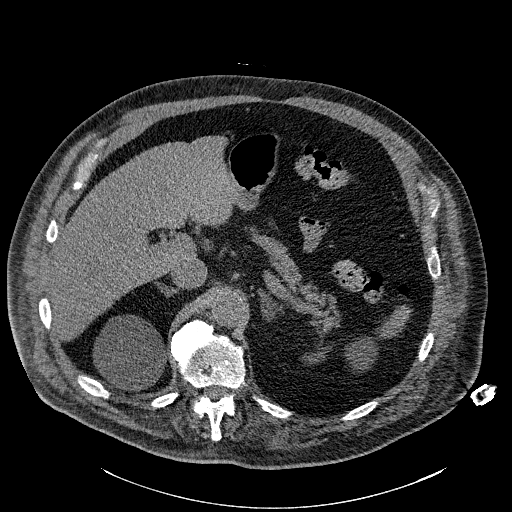
[im 14/181  lung]
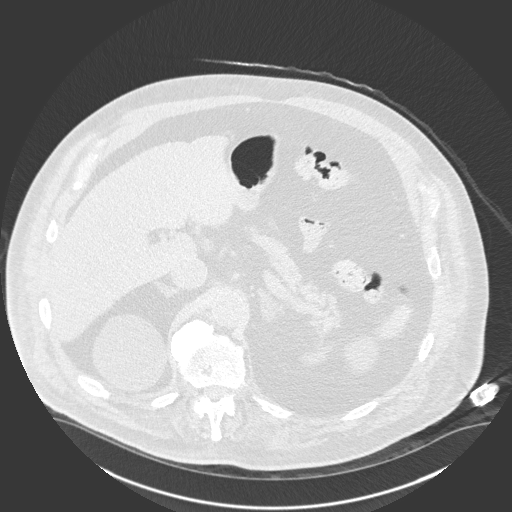
[im 28/181  lung]
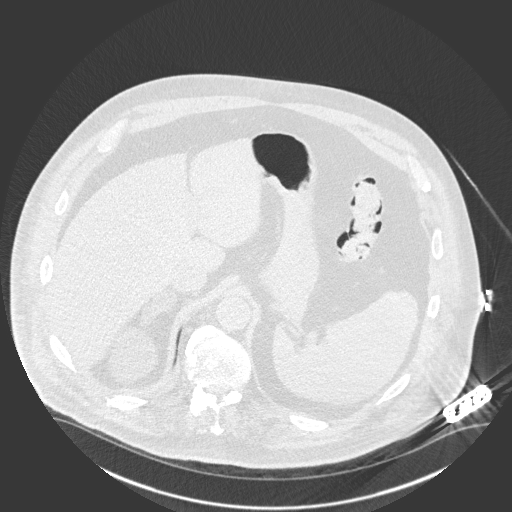
[im 42/181  lung]
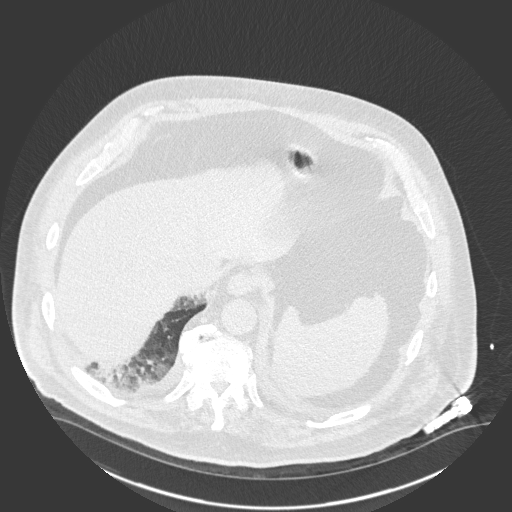
[im 56/181  lung]
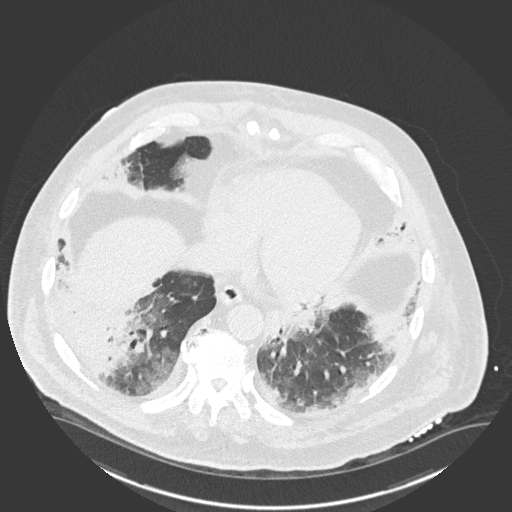
[im 70/181  mediastinal]
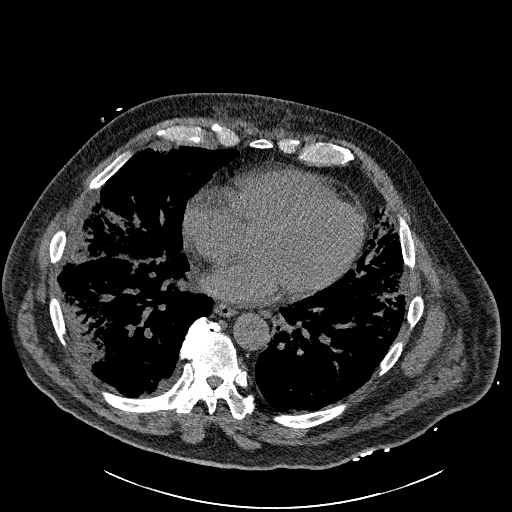
[im 70/181  lung]
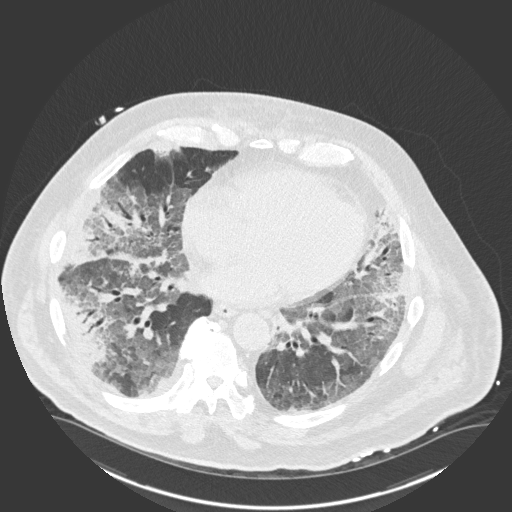
[im 84/181  lung]
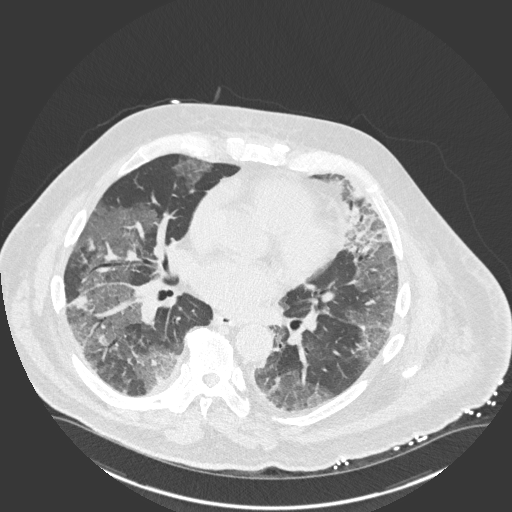
[im 97/181  lung]
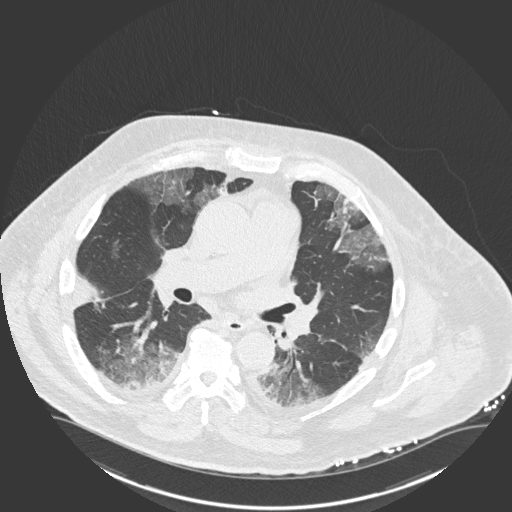
[im 111/181  lung]
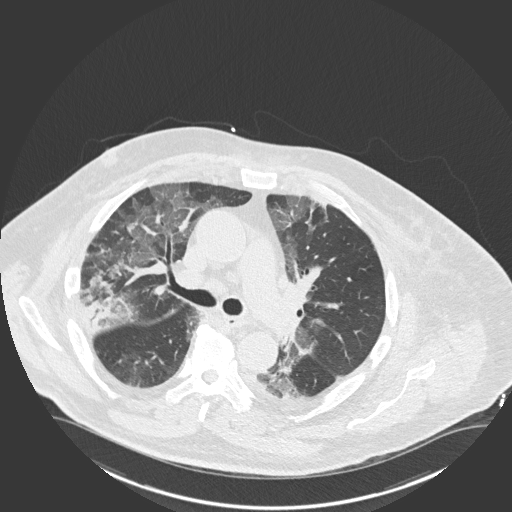
[im 125/181  mediastinal]
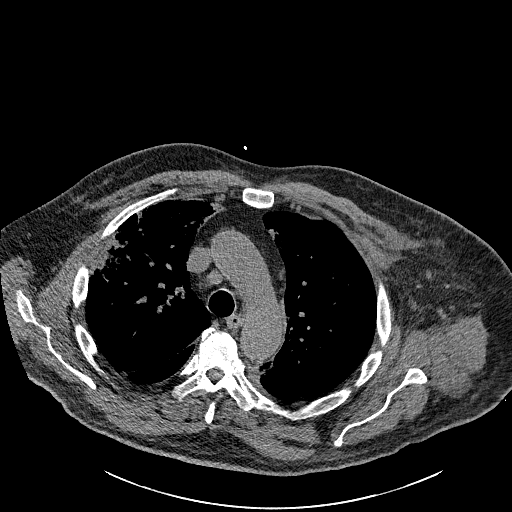
[im 125/181  lung]
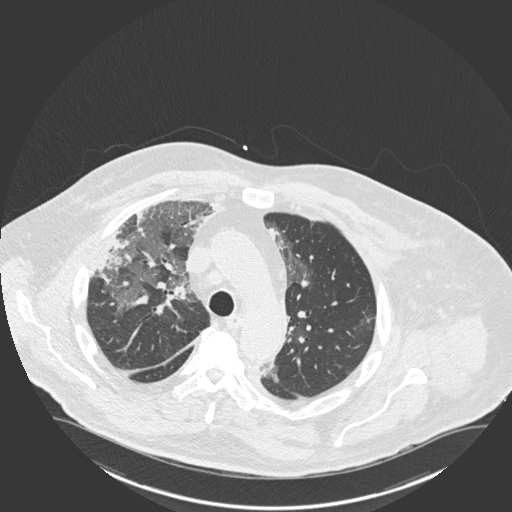
[im 139/181  lung]
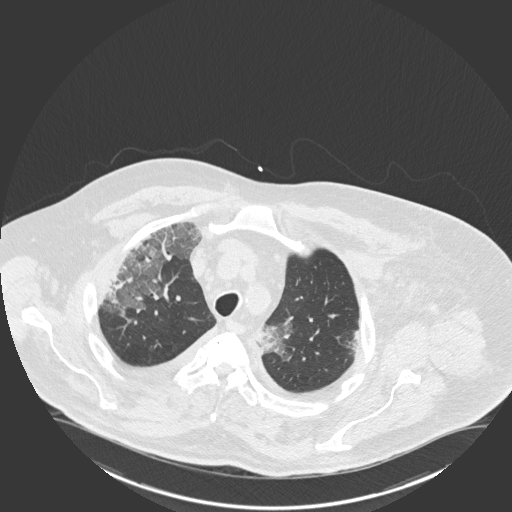
[im 153/181  lung]
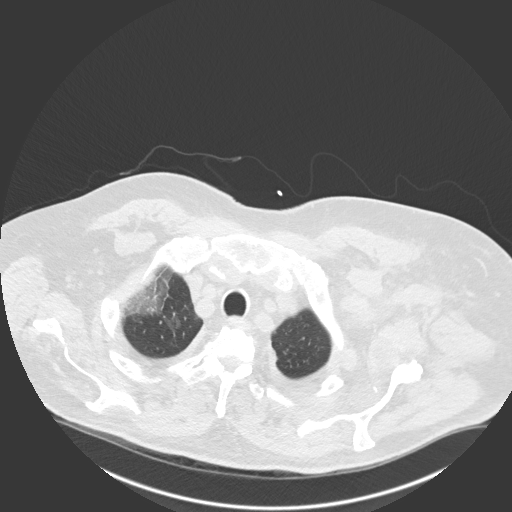
[im 167/181  lung]
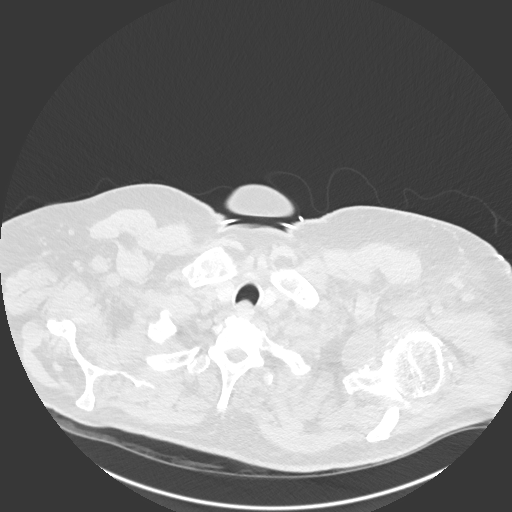

[Series 5: coronal · coronal · 0.71mm/px · 3 of 179 slices shown]
[im 36/179  lung]
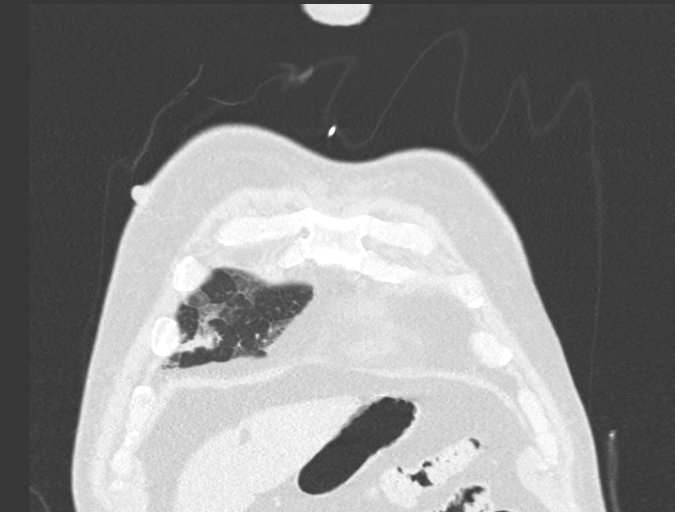
[im 72/179  lung]
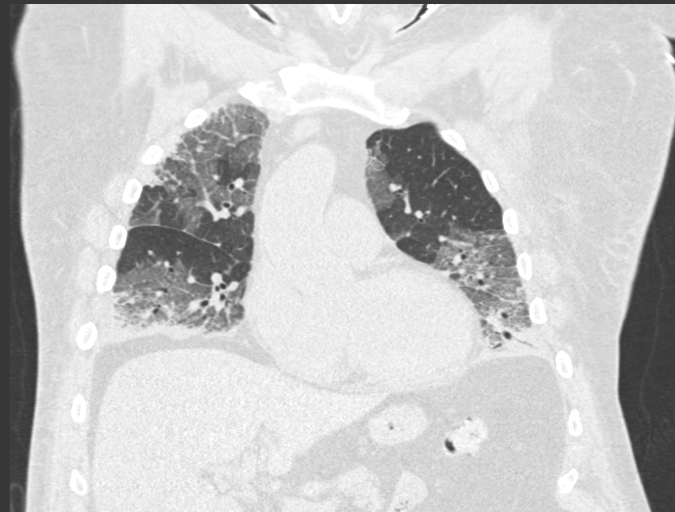
[im 107/179  lung]
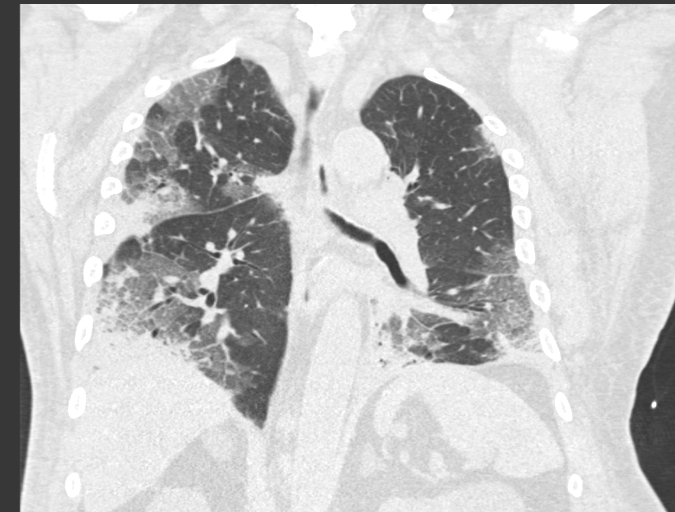

[15 of 36 positions shown; findings below may reference images not displayed]

FINDINGS: Cardiovascular: Heart size appears within normal limits. No
pericardial effusion. Aortic atherosclerosis. Coronary artery
calcifications.

Mediastinum/Nodes: Prominent mediastinal lymph nodes are identified,
none of which meet CT criteria for adenopathy. This likely reflects
reactive change. Thyroid gland, trachea, and esophagus demonstrate
no significant findings.

Lungs/Pleura: Trace pleural fluid identified overlying the posterior
lung bases. Bilateral multifocal airspace and ground-glass opacities
are again noted identified compatible with multifocal pneumonia.
This is most severe within the posterolateral right lower lobe,
anterolateral left lower lobe and periphery of the right upper lobe.
Compared with the previous exam there is been progressive
ground-glass opacification bilaterally.

Upper Abdomen: Bilateral kidney cysts are noted. Incompletely
characterized without IV contrast. Low-attenuation nodule in the
right adrenal gland measures -4 Hounsfield units and is compatible
with a benign adenoma.

Musculoskeletal: No chest wall mass or suspicious bone lesions
identified. Thoracic degenerative disc disease is noted.
IMPRESSION: 1. Persistent bilateral multifocal airspace and ground-glass
opacities compatible with multifocal pneumonia. Compared with the
previous exam there has been progressive ground-glass opacification
bilaterally.
2. No evidence for clinically significant pleural effusion or
pulmonary abscess.
3. Aortic atherosclerosis. Coronary artery calcifications noted.
4. Right adrenal gland adenoma.

Aortic Atherosclerosis ([id number]).

## 2022-05-21 ENCOUNTER — Telehealth: Payer: Self-pay | Admitting: Family Medicine

## 2022-05-21 NOTE — Telephone Encounter (Signed)
Contacted Jacob Mitchell to schedule their annual wellness visit. Patient declined to schedule AWV at this time.  Cira Servant Patient Engineer, production II Direct Dial: [phone]

## 2022-06-09 DIAGNOSIS — H26493 Other secondary cataract, bilateral: Secondary | ICD-10-CM | POA: Diagnosis not present

## 2022-06-09 DIAGNOSIS — H43812 Vitreous degeneration, left eye: Secondary | ICD-10-CM | POA: Diagnosis not present

## 2022-06-09 DIAGNOSIS — H35372 Puckering of macula, left eye: Secondary | ICD-10-CM | POA: Diagnosis not present

## 2022-06-09 DIAGNOSIS — Z961 Presence of intraocular lens: Secondary | ICD-10-CM | POA: Diagnosis not present

## 2022-07-25 DIAGNOSIS — R3914 Feeling of incomplete bladder emptying: Secondary | ICD-10-CM | POA: Diagnosis not present

## 2022-07-25 DIAGNOSIS — R3915 Urgency of urination: Secondary | ICD-10-CM | POA: Diagnosis not present

## 2022-07-25 DIAGNOSIS — R350 Frequency of micturition: Secondary | ICD-10-CM | POA: Diagnosis not present

## 2022-07-28 DIAGNOSIS — H26493 Other secondary cataract, bilateral: Secondary | ICD-10-CM | POA: Diagnosis not present

## 2022-08-05 DIAGNOSIS — G5603 Carpal tunnel syndrome, bilateral upper limbs: Secondary | ICD-10-CM | POA: Diagnosis not present

## 2022-08-06 DIAGNOSIS — M21962 Unspecified acquired deformity of left lower leg: Secondary | ICD-10-CM | POA: Diagnosis not present

## 2022-08-06 DIAGNOSIS — Z125 Encounter for screening for malignant neoplasm of prostate: Secondary | ICD-10-CM | POA: Diagnosis not present

## 2022-08-06 DIAGNOSIS — M79672 Pain in left foot: Secondary | ICD-10-CM | POA: Diagnosis not present

## 2022-08-06 DIAGNOSIS — R9431 Abnormal electrocardiogram [ECG] [EKG]: Secondary | ICD-10-CM | POA: Diagnosis not present

## 2022-08-06 DIAGNOSIS — Z6835 Body mass index (BMI) 35.0-35.9, adult: Secondary | ICD-10-CM | POA: Diagnosis not present

## 2022-08-06 DIAGNOSIS — M25611 Stiffness of right shoulder, not elsewhere classified: Secondary | ICD-10-CM | POA: Diagnosis not present

## 2022-08-06 DIAGNOSIS — G5603 Carpal tunnel syndrome, bilateral upper limbs: Secondary | ICD-10-CM | POA: Diagnosis not present

## 2022-08-06 DIAGNOSIS — G8929 Other chronic pain: Secondary | ICD-10-CM | POA: Diagnosis not present

## 2022-08-06 DIAGNOSIS — G629 Polyneuropathy, unspecified: Secondary | ICD-10-CM | POA: Diagnosis not present

## 2022-08-06 DIAGNOSIS — M7752 Other enthesopathy of left foot: Secondary | ICD-10-CM | POA: Diagnosis not present

## 2022-08-06 DIAGNOSIS — I6782 Cerebral ischemia: Secondary | ICD-10-CM | POA: Diagnosis not present

## 2022-08-06 DIAGNOSIS — G3184 Mild cognitive impairment, so stated: Secondary | ICD-10-CM | POA: Diagnosis not present

## 2022-08-06 DIAGNOSIS — M7732 Calcaneal spur, left foot: Secondary | ICD-10-CM | POA: Diagnosis not present

## 2022-08-06 DIAGNOSIS — M25612 Stiffness of left shoulder, not elsewhere classified: Secondary | ICD-10-CM | POA: Diagnosis not present

## 2022-08-06 DIAGNOSIS — Z87820 Personal history of traumatic brain injury: Secondary | ICD-10-CM | POA: Diagnosis not present

## 2022-08-06 DIAGNOSIS — Z0000 Encounter for general adult medical examination without abnormal findings: Secondary | ICD-10-CM | POA: Diagnosis not present

## 2022-08-06 DIAGNOSIS — R200 Anesthesia of skin: Secondary | ICD-10-CM | POA: Diagnosis not present

## 2022-08-06 DIAGNOSIS — Z136 Encounter for screening for cardiovascular disorders: Secondary | ICD-10-CM | POA: Diagnosis not present

## 2022-08-06 DIAGNOSIS — Z7289 Other problems related to lifestyle: Secondary | ICD-10-CM | POA: Diagnosis not present

## 2022-08-06 DIAGNOSIS — G3109 Other frontotemporal dementia: Secondary | ICD-10-CM | POA: Diagnosis not present

## 2022-08-06 DIAGNOSIS — Z9181 History of falling: Secondary | ICD-10-CM | POA: Diagnosis not present

## 2022-08-06 DIAGNOSIS — M19072 Primary osteoarthritis, left ankle and foot: Secondary | ICD-10-CM | POA: Diagnosis not present

## 2022-08-07 DIAGNOSIS — Z0000 Encounter for general adult medical examination without abnormal findings: Secondary | ICD-10-CM | POA: Diagnosis not present

## 2022-08-07 DIAGNOSIS — G5603 Carpal tunnel syndrome, bilateral upper limbs: Secondary | ICD-10-CM | POA: Diagnosis not present

## 2022-08-07 DIAGNOSIS — R931 Abnormal findings on diagnostic imaging of heart and coronary circulation: Secondary | ICD-10-CM | POA: Diagnosis not present

## 2022-08-07 DIAGNOSIS — Z87820 Personal history of traumatic brain injury: Secondary | ICD-10-CM | POA: Insufficient documentation

## 2022-08-07 DIAGNOSIS — I2510 Atherosclerotic heart disease of native coronary artery without angina pectoris: Secondary | ICD-10-CM | POA: Diagnosis not present

## 2022-08-07 DIAGNOSIS — Y9361 Activity, american tackle football: Secondary | ICD-10-CM | POA: Insufficient documentation

## 2022-08-07 DIAGNOSIS — R632 Polyphagia: Secondary | ICD-10-CM | POA: Diagnosis not present

## 2022-08-07 DIAGNOSIS — G4750 Parasomnia, unspecified: Secondary | ICD-10-CM | POA: Diagnosis not present

## 2022-08-07 DIAGNOSIS — R9431 Abnormal electrocardiogram [ECG] [EKG]: Secondary | ICD-10-CM | POA: Diagnosis not present

## 2022-08-08 DIAGNOSIS — R42 Dizziness and giddiness: Secondary | ICD-10-CM | POA: Diagnosis not present

## 2022-08-08 DIAGNOSIS — R29898 Other symptoms and signs involving the musculoskeletal system: Secondary | ICD-10-CM | POA: Diagnosis not present

## 2022-08-19 ENCOUNTER — Ambulatory Visit (INDEPENDENT_AMBULATORY_CARE_PROVIDER_SITE_OTHER): Payer: Medicare Other | Admitting: Family Medicine

## 2022-08-19 ENCOUNTER — Encounter: Payer: Self-pay | Admitting: Family Medicine

## 2022-08-19 VITALS — BP 137/77 | HR 64 | Ht 79.0 in | Wt 305.0 lb

## 2022-08-19 DIAGNOSIS — G8929 Other chronic pain: Secondary | ICD-10-CM | POA: Diagnosis not present

## 2022-08-19 DIAGNOSIS — R42 Dizziness and giddiness: Secondary | ICD-10-CM

## 2022-08-19 DIAGNOSIS — M25551 Pain in right hip: Secondary | ICD-10-CM | POA: Diagnosis not present

## 2022-08-19 DIAGNOSIS — G5603 Carpal tunnel syndrome, bilateral upper limbs: Secondary | ICD-10-CM | POA: Diagnosis not present

## 2022-08-19 DIAGNOSIS — M25511 Pain in right shoulder: Secondary | ICD-10-CM

## 2022-08-19 DIAGNOSIS — Z23 Encounter for immunization: Secondary | ICD-10-CM | POA: Diagnosis not present

## 2022-08-19 DIAGNOSIS — Z87820 Personal history of traumatic brain injury: Secondary | ICD-10-CM | POA: Diagnosis not present

## 2022-08-19 DIAGNOSIS — M25552 Pain in left hip: Secondary | ICD-10-CM | POA: Diagnosis not present

## 2022-08-19 DIAGNOSIS — M25512 Pain in left shoulder: Secondary | ICD-10-CM | POA: Diagnosis not present

## 2022-08-19 DIAGNOSIS — R931 Abnormal findings on diagnostic imaging of heart and coronary circulation: Secondary | ICD-10-CM

## 2022-08-19 MED ORDER — ATORVASTATIN CALCIUM 40 MG PO TABS: 40.0000 mg | ORAL_TABLET | Freq: Every day | ORAL | 3 refills | Status: DC

## 2022-08-19 NOTE — Assessment & Plan Note (Signed)
He would prefer to just go ahead and see a Hydrographic surveyor.  Referral placed

## 2022-08-19 NOTE — Assessment & Plan Note (Signed)
We discussed benefits of statin with elevated coronary calcium score and elevated LDL.  We did also review potential adverse effects of statin use.  Starting atorvastatin 40 mg daily.  Return in approximate 4 months to recheck lipids and LFTs.

## 2022-08-19 NOTE — Assessment & Plan Note (Signed)
Mild small vessel disease and moderate cerebral atrophy noted on MRI.  No significant cognitive impairment.

## 2022-08-19 NOTE — Assessment & Plan Note (Signed)
Referral placed to physical therapy for chronic shoulder and hip pain.

## 2022-08-19 NOTE — Assessment & Plan Note (Signed)
Referral to vestibular therapy

## 2022-08-19 NOTE — Progress Notes (Signed)
Jacob Mitchell - 77 y.o. male MRN [id number]  Date of birth: 04/15/1945  Subjective Chief Complaint  Patient presents with   Results    HPI Jacob Mitchell is a 77 y.o. male here today for follow up.   He recently had a comprehensive evaluation at [facility] that the [employer] sent him for.  This is referred to as the brain and body program through the [facility] at [employer].  Multiple aspects were tested including laboratory testing, cognitive testing, neuroimaging physical therapy and orthopedic evaluations.  He has had multiple orthopedic injuries and joint replacements.  Testing that he had at [facility] did reveal carpal tunnel syndrome that was recommended that he seek evaluation by a hand surgeon and/or consider seeing sports medicine for injection.  He is more interested and definitive treatment rather than something that may last a few months.  He was evaluated by physical therapy and was felt to benefit from continued physical therapy to help with his gait instability and balance.  It was also felt that he may benefit from vestibular therapy.    He did have a coronary calcium score which was in the 45th percentile with a Agatston score of 287.  LDL mildly elevated at 101 with an HDL of 34.  Recommend that he discuss with PCP regarding statin.  He does do well with his diet.  He does try to stay as active as he can.  He did undergo cognitive testing and had MRI of the brain showing chronic white matter microvascular ischemic changes and moderate atrophy.  No significant cognitive impairment was noted.  Echocardiogram with preserved LVEF of 55%.  No diastolic dysfunction noted.  ROS:  A comprehensive ROS was completed and negative except as noted per HPI   Allergies  Allergen Reactions   Aspartame And Phenylalanine Palpitations    Artificial sweetener- hrt 140  went to ED   Daptomycin Other (See Comments)    Presumed Eosinophilic pneumonia   Shellfish Allergy      Past Medical History:  Diagnosis Date   Arm DVT (deep venous thromboembolism), acute, left ([facility]) 03/27/2021   Benign localized prostatic hyperplasia with lower urinary tract symptoms (LUTS)    Complication of anesthesia    pt says he gets "restless" when he is waking up and nurses have had to "hold him down"- happened several surgeries ago    Diverticulosis of colon    History of colon polyps    History of kidney stones    History of MRSA infection 2007   post right shoulder surgery   History of urinary retention 05/31/2017   due to BPH   Incomplete emptying of bladder    Left arm swelling 08/21/2020   OA (osteoarthritis)    Peripheral neuropathy    feet-- hx frost bite   Pinched nerve in neck    right index and middle fingers numbness   Wears glasses    Wears hearing aid in both ears     Past Surgical History:  Procedure Laterality Date   CATARACT EXTRACTION Left 01/13/2020   COLONOSCOPY     CYST REMOVAL LEG Right 1975   CYSTOSCOPY WITH INSERTION OF UROLIFT N/A 07/20/2017   Procedure: CYSTOSCOPY WITH INSERTION OF UROLIFT;  Surgeon: Malen Gauze, MD;  [facility];  Service: Urology;  Laterality: N/A;   CYSTOSCOPY WITH INSERTION OF UROLIFT     DISTAL CLAVICLE EXCISION Left 1977   left shoulder  ELBOW SURGERY Right 1969;  1980   HAND SURGERY Right 1968   INCISION AND DRAINAGE HIP Left 05/01/2020   Procedure: IRRIGATION AND DEBRIDEMENT LEFT HIP SEROMA;  Surgeon: Durene Romans, MD;  Location: [facility];  Service: Orthopedics;  Laterality: Left;  90 mins   LAMINECTOMY  1970   L4-5   LUMBAR FUSION  2013  approx.   L1--L5   ORIF HIP FRACTURE Left 01/17/2020   Procedure: OPEN REDUCTION INTERNAL FIXATION LEFT HIP GREATER TROCHANTER FRACTURE;  Surgeon: Durene Romans, MD;  Location: [facility];  Service: Orthopedics;  Laterality: Left;  90 MINS   ORIF PERIPROSTHETIC FRACTURE Left 02/27/2020   Procedure: OPEN REDUCTION INTERNAL FIXATION (ORIF) LEFT  PERI-PROSTETIC  HIP FRACTURE;  Surgeon: Durene Romans, MD;  Location: [facility];  Service: Orthopedics;  Laterality: Left;    ROTATOR CUFF REPAIR Right x5    last one 2007   5 surgeries in 9 days--- original repair then 4 sx'  I&D sx's for MRSA infection   SHOULDER ARTHROSCOPY WITH DISTAL CLAVICLE RESECTION Left 1977   SHOULDER SURGERY Left 1976   TOTAL HIP ARTHROPLASTY Left 11/23/2015   Procedure: LEFT TOTAL HIP ARTHROPLASTY ANTERIOR APPROACH;  Surgeon: Durene Romans, MD;  Location: [facility];  Service: Orthopedics;  Laterality: Left;   TOTAL HIP REVISION     10/14/16 Dr. Charlann Boxer   TOTAL HIP REVISION Left 10/14/2016   Procedure: TOTAL HIP REVISION POSTERIOR APPROACH;  Surgeon: Durene Romans, MD;  Location: [facility];  Service: Orthopedics;  Laterality: Left; (total of 4 hip surgeries in all)    Social History   Socioeconomic History   Marital status: Married    Spouse name: Not on file   Number of children: Not on file   Years of education: Not on file   Highest education level: Not on file  Occupational History   Not on file  Tobacco Use   Smoking status: Never   Smokeless tobacco: Never  Vaping Use   Vaping Use: Never used  Substance and Sexual Activity   Alcohol use: No   Drug use: No   Sexual activity: Yes  Other Topics Concern   Not on file  Social History Narrative   Not on file   Social Determinants of Health   Financial Resource Strain: Not on file  Food Insecurity: Not on file  Transportation Needs: Not on file  Physical Activity: Not on file  Stress: Not on file  Social Connections: Not on file    Family History  Problem Relation Age of Onset   Alcohol abuse Father    Drug abuse Brother     Health Maintenance  Topic Date Due   DTaP/Tdap/Td (1 - Tdap) Never done   Pneumonia Vaccine 67+ Years old (1 of 1 - PCV) Never done   Medicare Annual Wellness (AWV)  11/19/2022 (Originally Jan 27, 1946)   COVID-19 Vaccine (4 - 2023-24 season) 12/05/2022 (Originally 10/11/2021)    Hepatitis C Screening  08/19/2023 (Originally 07/30/1963)   INFLUENZA VACCINE  09/11/2022   Zoster Vaccines- Shingrix  Completed   HPV VACCINES  Aged Out     ----------------------------------------------------------------------------------------------------------------------------------------------------------------------------------------------------------------- Physical Exam BP 137/77 (BP Location: Left Arm, Patient Position: Sitting, Cuff Size: Large)   Pulse 64   Ht 6\' 7"  (2.007 m)   Wt (!) 305 lb (138.3 kg)   SpO2 96%   BMI 34.36 kg/m   Physical Exam Constitutional:      Appearance: Normal appearance.  HENT:     Head: Normocephalic and atraumatic.  Eyes:     General: No scleral icterus. Musculoskeletal:     Comments: Deformity noted on lateral left foot.  Neurological:     General: No focal deficit present.     Mental Status: He is alert.  Psychiatric:        Mood and Affect: Mood normal.        Behavior: Behavior normal.     ------------------------------------------------------------------------------------------------------------------------------------------------------------------------------------------------------------------- Assessment and Plan  Agatston CAC score 200-399 We discussed benefits of statin with elevated coronary calcium score and elevated LDL.  We did also review potential adverse effects of statin use.  Starting atorvastatin 40 mg daily.  Return in approximate 4 months to recheck lipids and LFTs.  Carpal tunnel syndrome on both sides He would prefer to just go ahead and see a hand surgeon.  Referral placed  Personal history of multiple concussions Mild small vessel disease and moderate cerebral atrophy noted on MRI.  No significant cognitive impairment.  Dizziness Referral to vestibular therapy.  Chronic pain of both shoulders Referral placed to physical therapy for chronic shoulder and hip pain.   Meds ordered this encounter   Medications   atorvastatin (LIPITOR) 40 MG tablet    Sig: Take 1 tablet (40 mg total) by mouth daily.    Dispense:  90 tablet    Refill:  3    Return in about 4 months (around 12/20/2022) for HLD.    This visit occurred during the SARS-CoV-2 public health emergency.  Safety protocols were in place, including screening questions prior to the visit, additional usage of staff PPE, and extensive cleaning of exam room while observing appropriate contact time as indicated for disinfecting solutions.

## 2022-08-19 NOTE — Patient Instructions (Signed)
Great to see you today!  Start atorvastatin 40mg  daily for cholesterol.  Referral placed to physical therapy and hand surgeon.    See me again in about 4 months.

## 2022-08-21 ENCOUNTER — Encounter: Payer: Self-pay | Admitting: Physical Therapy

## 2022-08-21 ENCOUNTER — Ambulatory Visit: Payer: Medicare Other | Attending: Family Medicine | Admitting: Physical Therapy

## 2022-08-21 ENCOUNTER — Other Ambulatory Visit: Payer: Self-pay

## 2022-08-21 DIAGNOSIS — R2689 Other abnormalities of gait and mobility: Secondary | ICD-10-CM | POA: Insufficient documentation

## 2022-08-21 DIAGNOSIS — R293 Abnormal posture: Secondary | ICD-10-CM | POA: Insufficient documentation

## 2022-08-21 DIAGNOSIS — M25551 Pain in right hip: Secondary | ICD-10-CM | POA: Insufficient documentation

## 2022-08-21 DIAGNOSIS — M25552 Pain in left hip: Secondary | ICD-10-CM | POA: Diagnosis not present

## 2022-08-21 DIAGNOSIS — R42 Dizziness and giddiness: Secondary | ICD-10-CM | POA: Insufficient documentation

## 2022-08-21 DIAGNOSIS — M25512 Pain in left shoulder: Secondary | ICD-10-CM | POA: Insufficient documentation

## 2022-08-21 DIAGNOSIS — M6281 Muscle weakness (generalized): Secondary | ICD-10-CM | POA: Diagnosis not present

## 2022-08-21 DIAGNOSIS — G8929 Other chronic pain: Secondary | ICD-10-CM | POA: Insufficient documentation

## 2022-08-21 DIAGNOSIS — M25652 Stiffness of left hip, not elsewhere classified: Secondary | ICD-10-CM | POA: Diagnosis not present

## 2022-08-21 DIAGNOSIS — M25511 Pain in right shoulder: Secondary | ICD-10-CM | POA: Diagnosis not present

## 2022-08-21 DIAGNOSIS — R2681 Unsteadiness on feet: Secondary | ICD-10-CM | POA: Diagnosis not present

## 2022-08-21 NOTE — Therapy (Signed)
OUTPATIENT PHYSICAL THERAPY NEURO EVALUATION   Patient Name: Jacob Mitchell MRN: [id number] DOB:1945/08/23, 77 y.o., male Today's Date: 08/21/2022   PCP: Everrett Coombe, DO REFERRING PROVIDER: Everrett Coombe, DO  END OF SESSION:  PT End of Session - 08/21/22 0757     Visit Number 1    Number of Visits 16    Date for PT Re-Evaluation 10/16/22    Authorization Type Medicare    Progress Note Due on Visit 10    PT Start Time 0800    PT Stop Time 0845    PT Time Calculation (min) 45 min    Activity Tolerance Patient tolerated treatment well    Behavior During Therapy [facility] for tasks assessed/performed             Past Medical History:  Diagnosis Date   Arm DVT (deep venous thromboembolism), acute, left (HCC) 03/27/2021   Benign localized prostatic hyperplasia with lower urinary tract symptoms (LUTS)    Complication of anesthesia    pt says he gets "restless" when he is waking up and nurses have had to "hold him down"- happened several surgeries ago    Diverticulosis of colon    History of colon polyps    History of kidney stones    History of MRSA infection 2007   post right shoulder surgery   History of urinary retention 05/31/2017   due to BPH   Incomplete emptying of bladder    Left arm swelling 08/21/2020   OA (osteoarthritis)    Peripheral neuropathy    feet-- hx frost bite   Pinched nerve in neck    right index and middle fingers numbness   Wears glasses    Wears hearing aid in both ears    Past Surgical History:  Procedure Laterality Date   CATARACT EXTRACTION Left 01/13/2020   COLONOSCOPY     CYST REMOVAL LEG Right 1975   CYSTOSCOPY WITH INSERTION OF UROLIFT N/A 07/20/2017   Procedure: CYSTOSCOPY WITH INSERTION OF UROLIFT;  Surgeon: Malen Gauze, MD;  Location: [facility];  Service: Urology;  Laterality: N/A;   CYSTOSCOPY WITH INSERTION OF UROLIFT     DISTAL CLAVICLE EXCISION Left 1977   left shoulder   ELBOW SURGERY Right 1969;  1980    HAND SURGERY Right 1968   INCISION AND DRAINAGE HIP Left 05/01/2020   Procedure: IRRIGATION AND DEBRIDEMENT LEFT HIP SEROMA;  Surgeon: Durene Romans, MD;  Location: [facility];  Service: Orthopedics;  Laterality: Left;  90 mins   LAMINECTOMY  1970   L4-5   LUMBAR FUSION  2013  approx.   L1--L5   ORIF HIP FRACTURE Left 01/17/2020   Procedure: OPEN REDUCTION INTERNAL FIXATION LEFT HIP GREATER TROCHANTER FRACTURE;  Surgeon: Durene Romans, MD;  Location: [facility];  Service: Orthopedics;  Laterality: Left;  90 MINS   ORIF PERIPROSTHETIC FRACTURE Left 02/27/2020   Procedure: OPEN REDUCTION INTERNAL FIXATION (ORIF) LEFT PERI-PROSTETIC  HIP FRACTURE;  Surgeon: Durene Romans, MD;  Location: [facility];  Service: Orthopedics;  Laterality: Left;    ROTATOR CUFF REPAIR Right x5    last one 2007   5 surgeries in 9 days--- original repair then 4 sx'  I&D sx's for MRSA infection   SHOULDER ARTHROSCOPY WITH DISTAL CLAVICLE RESECTION Left 1977   SHOULDER SURGERY Left 1976   TOTAL HIP ARTHROPLASTY Left 11/23/2015   Procedure: LEFT TOTAL HIP ARTHROPLASTY ANTERIOR APPROACH;  Surgeon: Durene Romans, MD;  Location: [facility];  Service: Orthopedics;  Laterality: Left;   TOTAL HIP REVISION     10/14/16 Dr. 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   Coronary artery disease involving native coronary artery without angina pectoris 08/07/2022   Agatston CAC score 200-399 08/07/2022   Carpal tunnel syndrome on both sides 08/07/2022   Screening for lipid disorders 06/24/2021   Fracture of greater trochanter (HCC)  01/17/2020   Obese 10/15/2016   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/14/2016   History of nephrolithiasis 07/24/2016   Neuropathy 07/24/2016   Calculus of kidney 07/24/2016   History of repair of hip joint 11/23/2015    ONSET DATE: Chronic  REFERRING DIAG:  R42 (ICD-10-CM) - Dizziness  M25.551,M25.552,G89.29 (ICD-10-CM) - Chronic pain of both hips  M25.511,G89.29,M25.512 (ICD-10-CM) - Chronic pain of both shoulders    THERAPY DIAG:  Abnormal posture  Muscle weakness (generalized)  Stiffness of left hip, not elsewhere classified  Other abnormalities of gait and mobility  Unsteadiness on feet  Rationale for Evaluation and Treatment: Rehabilitation  SUBJECTIVE:                                                                                                                                                                                             SUBJECTIVE STATEMENT: "I've had 22 operations and 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 Pt was determined to get on a cane and would like to get off it. Pt states he uses the cane primarily for balance.  Pt accompanied by: self  PERTINENT HISTORY: Last hip surgery 1.5 years ago  PAIN:  Are you having pain? No  PRECAUTIONS: Fall  RED FLAGS: None   WEIGHT BEARING RESTRICTIONS: No  FALLS: Has patient fallen in last 6 months? Yes. Number of falls 1 slid out of bed  LIVING ENVIRONMENT: Lives with: lives with their spouse Lives in: House/apartment Stairs: No Has following equipment at home: None  PLOF: Independent  PATIENT GOALS: Get off using cane  OBJECTIVE:   DIAGNOSTIC FINDINGS: Nothing recent on file  COGNITION: Overall  cognitive status: Within functional limits for tasks assessed   SENSATION: A little neuropathy on outside of both feet   MUSCLE LENGTH: Maisie Fus test: Right -10 deg; Left -10 deg Quad: R  90 deg, L 90 deg  POSTURE: rounded shoulders, increased thoracic kyphosis, flexed trunk , weight shift right, and L foot abducted, L iliac crest elevated compared to R, slight L knee bend  LOWER EXTREMITY ROM:     Active  Right Eval Left Eval  Hip flexion    Hip extension    Hip abduction    Hip adduction    Hip internal rotation    Hip external rotation    Knee flexion    Knee extension    Ankle dorsiflexion    Ankle plantarflexion    Ankle inversion    Ankle eversion     (Blank rows = not tested)  LOWER EXTREMITY MMT:    MMT Right Eval Left Eval  Hip flexion 4- 4-  Hip extension 3- 2+  Hip abduction 4+ 3-  Hip adduction    Hip internal rotation 5 3  Hip external rotation 4+ 3  Knee flexion 5 5  Knee extension 5 5  Ankle dorsiflexion    Ankle plantarflexion    Ankle inversion    Ankle eversion    (Blank rows = not tested)  BED MOBILITY:  Sit to supine SBA Supine to sit SBA Rolling to Right SBA Rolling to Left SBA   STAIRS: Level of Assistance: SBA Stair Negotiation Technique: Alternating Pattern  with Bilateral Rails Number of Stairs: 5  Height of Stairs: 4-6"   GAIT: Gait pattern:  L foot slap, L foot externally rotates during swing, wide BOS, leans on cane on R with R shoulder hiked/elevated, antalgic Distance walked: Within clinic Assistive device utilized: Single point cane Level of assistance: SBA  FUNCTIONAL TESTS:  5 times sit to stand: 18.11 sec Berg Balance Scale: 38/56 Dynamic Gait Index: 19/24  [facility] PT Assessment - 08/21/22 0001       Standardized Balance Assessment   Standardized Balance Assessment Berg Balance Test;Dynamic Gait Index      Berg Balance Test   Sit to Stand Able to stand without using hands and stabilize independently    Standing Unsupported Able to stand 2 minutes with supervision    Sitting with Back Unsupported but Feet Supported on Floor or Stool Able to sit safely and securely 2 minutes    Stand to Sit  Controls descent by using hands    Transfers Able to transfer safely, definite need of hands    Standing Unsupported with Eyes Closed Able to stand 10 seconds with supervision    Standing Unsupported with Feet Together Able to place feet together independently and stand 1 minute safely    From Standing, Reach Forward with Outstretched Arm Can reach forward >5 cm safely (2")    From Standing Position, Pick up Object from Floor Unable to pick up shoe, but reaches 2-5 cm (1-2") from shoe and balances independently    From Standing Position, Turn to Look Behind Over each Shoulder Looks behind from both sides and weight shifts well    Turn 360 Degrees Able to turn 360 degrees safely but slowly    Standing Unsupported, Alternately Place Feet on Step/Stool Able to complete 4 steps without aid or supervision    Standing Unsupported, One Foot in Front Able to take small step independently and hold 30 seconds    Standing on One Leg Unable  to try or needs assist to prevent fall    Total Score 38    Berg comment: 38/56      Dynamic Gait Index   Level Surface Mild Impairment    Change in Gait Speed Normal    Gait with Horizontal Head Turns Normal    Gait with Vertical Head Turns Mild Impairment    Gait and Pivot Turn Mild Impairment    Step Over Obstacle Mild Impairment    Step Around Obstacles Normal    Steps Mild Impairment    Total Score 19    DGI comment: 19/24              PATIENT SURVEYS:  TBA  TODAY'S TREATMENT:                                                                                                                              DATE: 08/21/22 See HEP below    PATIENT EDUCATION: Education details: Exam findings, POC, initial HEP Person educated: Patient Education method: Explanation, Demonstration, and Handouts Education comprehension: verbalized understanding, returned demonstration, and needs further education  HOME EXERCISE PROGRAM: Access Code: [email or web address] URL:  [email or web address] Date: 08/21/2022 Prepared by: Vernon Prey April Kirstie Peri  Exercises - Wide Tandem Stance with Eyes Open  - 1 x daily - 7 x weekly - 2 sets - 30 sec hold - Prone Press Up On Elbows  - 1 x daily - 7 x weekly - 2 sets - 30 sec hold - Prone Quadriceps Stretch with Strap  - 1 x daily - 7 x weekly - 2 sets - 30 sec hold  GOALS: Goals reviewed with patient? Yes  SHORT TERM GOALS: Target date: 09/18/2022   Pt will be ind with initial HEP Baseline: Goal status: INITIAL  2.  Pt will demo increased quad/hip flexor flexibility with at least 100 deg of knee bend during [place] test Baseline:  Goal status: INITIAL  3.  Pt will be independent with maintaining increased trunk/hip extension in standing Baseline:  Goal status: INITIAL   LONG TERM GOALS: Target date: 10/16/2022   Pt will be ind with management and progression of HEP Baseline:  Goal status: INITIAL  2.  Pt will have increased Berg Balance score of >/=45/56 to demo decreased fall risk Baseline:  Goal status: INITIAL  3.  Pt will demo improved DGI to >/=20/24 to demo decreased fall risk Baseline:  Goal status: INITIAL  4.  Pt will be able to amb ind x400' per pt's personal goals Baseline:  Goal status: INITIAL   ASSESSMENT:  CLINICAL IMPRESSION: Patient is a 77 y.o. M who was seen today for physical therapy evaluation and treatment for L hip weakness and decreased balance/stability. PMH significant for multiple shoulder surgeries and 5 hip surgeries with increased scarring noted. Assessment significant for L>R hip weakness, decreased bilat hip muscle flexibility, decreased core strength/postural abnormalities, and decreased balance. Pt will benefit from PT to address these issues  to meet his goals to ambulate without a cane.   OBJECTIVE IMPAIRMENTS: Abnormal gait, decreased activity tolerance, decreased balance, decreased endurance, decreased mobility, difficulty walking, decreased ROM,  decreased strength, increased fascial restrictions, increased muscle spasms, impaired flexibility, improper body mechanics, and postural dysfunction.   ACTIVITY LIMITATIONS: standing, squatting, stairs, transfers, and locomotion level  PARTICIPATION LIMITATIONS: meal prep, cleaning, laundry, and community activity  PERSONAL FACTORS: Fitness, Past/current experiences, Profession, and Time since onset of injury/illness/exacerbation are also affecting patient's functional outcome.   REHAB POTENTIAL: Good  CLINICAL DECISION MAKING: Evolving/moderate complexity  EVALUATION COMPLEXITY: Moderate  PLAN:  PT FREQUENCY: 2x/week  PT DURATION: 8 weeks  PLANNED INTERVENTIONS: Therapeutic exercises, Therapeutic activity, Neuromuscular re-education, Balance training, Gait training, Patient/Family education, Self Care, Joint mobilization, Aquatic Therapy, Dry Needling, Electrical stimulation, Spinal mobilization, Cryotherapy, Moist heat, Taping, Vasopneumatic device, Ionotophoresis 4mg /ml Dexamethasone, Manual therapy, and Re-evaluation  PLAN FOR NEXT SESSION: Review HEP. Work on strength, stretching and balance HEP.    Shalev Helminiak April Ma L Jolane Bankhead, PT 08/21/2022, 3:24 PM

## 2022-09-01 DIAGNOSIS — M13831 Other specified arthritis, right wrist: Secondary | ICD-10-CM | POA: Diagnosis not present

## 2022-09-01 DIAGNOSIS — G5603 Carpal tunnel syndrome, bilateral upper limbs: Secondary | ICD-10-CM | POA: Diagnosis not present

## 2022-09-02 ENCOUNTER — Ambulatory Visit: Payer: Medicare Other | Admitting: Sports Medicine

## 2022-09-03 ENCOUNTER — Encounter: Payer: Self-pay | Admitting: Physical Therapy

## 2022-09-03 ENCOUNTER — Ambulatory Visit: Payer: Medicare Other | Admitting: Physical Therapy

## 2022-09-03 DIAGNOSIS — M6281 Muscle weakness (generalized): Secondary | ICD-10-CM | POA: Diagnosis not present

## 2022-09-03 DIAGNOSIS — R293 Abnormal posture: Secondary | ICD-10-CM | POA: Diagnosis not present

## 2022-09-03 DIAGNOSIS — R2689 Other abnormalities of gait and mobility: Secondary | ICD-10-CM

## 2022-09-03 DIAGNOSIS — M25652 Stiffness of left hip, not elsewhere classified: Secondary | ICD-10-CM | POA: Diagnosis not present

## 2022-09-03 DIAGNOSIS — R2681 Unsteadiness on feet: Secondary | ICD-10-CM

## 2022-09-03 DIAGNOSIS — R42 Dizziness and giddiness: Secondary | ICD-10-CM | POA: Diagnosis not present

## 2022-09-03 NOTE — Therapy (Signed)
OUTPATIENT PHYSICAL THERAPY NEURO EVALUATION   Patient Name: Jacob Mitchell MRN: [id number] DOB:August 01, 1945, 77 y.o., male Today's Date: 09/03/2022   PCP: Everrett Coombe, DO REFERRING PROVIDER: Everrett Coombe, DO  END OF SESSION:  PT End of Session - 09/03/22 1317     Visit Number 2    Number of Visits 16    Date for PT Re-Evaluation 10/16/22    Authorization Type Medicare    Progress Note Due on Visit 10    PT Start Time 1317    PT Stop Time 1400    PT Time Calculation (min) 43 min    Activity Tolerance Patient tolerated treatment well    Behavior During Therapy [facility] for tasks assessed/performed             Past Medical History:  Diagnosis Date   Arm DVT (deep venous thromboembolism), acute, left (HCC) 03/27/2021   Benign localized prostatic hyperplasia with lower urinary tract symptoms (LUTS)    Complication of anesthesia    pt says he gets "restless" when he is waking up and nurses have had to "hold him down"- happened several surgeries ago    Diverticulosis of colon    History of colon polyps    History of kidney stones    History of MRSA infection 2007   post right shoulder surgery   History of urinary retention 05/31/2017   due to BPH   Incomplete emptying of bladder    Left arm swelling 08/21/2020   OA (osteoarthritis)    Peripheral neuropathy    feet-- hx frost bite   Pinched nerve in neck    right index and middle fingers numbness   Wears glasses    Wears hearing aid in both ears    Past Surgical History:  Procedure Laterality Date   CATARACT EXTRACTION Left 01/13/2020   COLONOSCOPY     CYST REMOVAL LEG Right 1975   CYSTOSCOPY WITH INSERTION OF UROLIFT N/A 07/20/2017   Procedure: CYSTOSCOPY WITH INSERTION OF UROLIFT;  Surgeon: Malen Gauze, MD;  Location: [facility];  Service: Urology;  Laterality: N/A;   CYSTOSCOPY WITH INSERTION OF UROLIFT     DISTAL CLAVICLE EXCISION Left 1977   left shoulder   ELBOW SURGERY Right 1969;  1980    HAND SURGERY Right 1968   INCISION AND DRAINAGE HIP Left 05/01/2020   Procedure: IRRIGATION AND DEBRIDEMENT LEFT HIP SEROMA;  Surgeon: Durene Romans, MD;  Location: [facility];  Service: Orthopedics;  Laterality: Left;  90 mins   LAMINECTOMY  1970   L4-5   LUMBAR FUSION  2013  approx.   L1--L5   ORIF HIP FRACTURE Left 01/17/2020   Procedure: OPEN REDUCTION INTERNAL FIXATION LEFT HIP GREATER TROCHANTER FRACTURE;  Surgeon: Durene Romans, MD;  Location: [facility];  Service: Orthopedics;  Laterality: Left;  90 MINS   ORIF PERIPROSTHETIC FRACTURE Left 02/27/2020   Procedure: OPEN REDUCTION INTERNAL FIXATION (ORIF) LEFT PERI-PROSTETIC  HIP FRACTURE;  Surgeon: Durene Romans, MD;  Location: [facility];  Service: Orthopedics;  Laterality: Left;    ROTATOR CUFF REPAIR Right x5    last one 2007   5 surgeries in 9 days--- original repair then 4 sx'  I&D sx's for MRSA infection   SHOULDER ARTHROSCOPY WITH DISTAL CLAVICLE RESECTION Left 1977   SHOULDER SURGERY Left 1976   TOTAL HIP ARTHROPLASTY Left 11/23/2015   Procedure: LEFT TOTAL HIP ARTHROPLASTY ANTERIOR APPROACH;  Surgeon: Durene Romans, MD;  Location: [facility];  Service: Orthopedics;  Laterality: Left;   TOTAL HIP REVISION     10/14/16 Dr. 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   Coronary artery disease involving native coronary artery without angina pectoris 08/07/2022   Agatston CAC score 200-399 08/07/2022   Carpal tunnel syndrome on both sides 08/07/2022   Screening for lipid disorders 06/24/2021   Fracture of greater trochanter (HCC)  01/17/2020   Obese 10/15/2016   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/14/2016   History of nephrolithiasis 07/24/2016   Neuropathy 07/24/2016   Calculus of kidney 07/24/2016   History of repair of hip joint 11/23/2015    ONSET DATE: Chronic  REFERRING DIAG:  R42 (ICD-10-CM) - Dizziness  M25.551,M25.552,G89.29 (ICD-10-CM) - Chronic pain of both hips  M25.511,G89.29,M25.512 (ICD-10-CM) - Chronic pain of both shoulders    THERAPY DIAG:  Abnormal posture  Muscle weakness (generalized)  Stiffness of left hip, not elsewhere classified  Other abnormalities of gait and mobility  Unsteadiness on feet  Rationale for Evaluation and Treatment: Rehabilitation  SUBJECTIVE:                                                                                                                                                                                             SUBJECTIVE STATEMENT: Pt reports his LEs are sore from doing exercises in the pool. Pt states he is to get carpal tunnel surgery next Friday.  Pt accompanied by: self  PERTINENT HISTORY: Last hip surgery 1.5 years ago  PAIN:  Are you having pain? No  PRECAUTIONS: Fall  RED FLAGS: None   WEIGHT BEARING RESTRICTIONS: No  FALLS: Has patient fallen in last 6 months? Yes. Number of falls 1 slid out of bed  LIVING ENVIRONMENT: Lives with: lives with their spouse Lives in: House/apartment Stairs: No Has following equipment at home: None  PLOF: Independent  PATIENT GOALS: Get off using cane  OBJECTIVE:   DIAGNOSTIC FINDINGS: Nothing recent on file  COGNITION: Overall cognitive status: Within functional limits for tasks assessed   SENSATION: A little neuropathy on outside of both feet   MUSCLE LENGTH: Maisie Fus test: Right -10 deg; Left -10 deg Quad: R 90 deg, L 90 deg  POSTURE: rounded shoulders, increased  thoracic kyphosis, flexed trunk , weight shift right, and L foot abducted, L iliac crest elevated compared to R,  slight L knee bend  LOWER EXTREMITY ROM:     Active  Right Eval Left Eval  Hip flexion    Hip extension    Hip abduction    Hip adduction    Hip internal rotation    Hip external rotation    Knee flexion    Knee extension    Ankle dorsiflexion    Ankle plantarflexion    Ankle inversion    Ankle eversion     (Blank rows = not tested)  LOWER EXTREMITY MMT:    MMT Right Eval Left Eval  Hip flexion 4- 4-  Hip extension 3- 2+  Hip abduction 4+ 3-  Hip adduction    Hip internal rotation 5 3  Hip external rotation 4+ 3  Knee flexion 5 5  Knee extension 5 5  Ankle dorsiflexion    Ankle plantarflexion    Ankle inversion    Ankle eversion    (Blank rows = not tested)  BED MOBILITY:  Sit to supine SBA Supine to sit SBA Rolling to Right SBA Rolling to Left SBA   STAIRS: Level of Assistance: SBA Stair Negotiation Technique: Alternating Pattern  with Bilateral Rails Number of Stairs: 5  Height of Stairs: 4-6"   GAIT: Gait pattern:  L foot slap, L foot externally rotates during swing, wide BOS, leans on cane on R with R shoulder hiked/elevated, antalgic Distance walked: Within clinic Assistive device utilized: Single point cane Level of assistance: SBA  FUNCTIONAL TESTS:  5 times sit to stand: 18.11 sec Berg Balance Scale: 38/56 Dynamic Gait Index: 19/24  PATIENT SURVEYS:  TBA  TODAY'S TREATMENT:        [facility] Adult PT Treatment:                                                DATE: 09/03/22 Therapeutic Exercise: Seated hamstring stretch x 30 sec R&L Seated figure 4 stretch x 30 sec R&L Seated hip flexor stretch 2x 30 sec R&L Standing glute set 2x10 Standing heel raise 3x10 Standing hip abduction toe touch 2x10 Standing hip ext toe touch 2x10 Neuromuscular re-ed: Staggered stance 2x30 sec Feet apart L<>R weight shifting x30 Feet together eyes open head rotation 2x30 sec Feet together eyes open head nods 2x30 sec Gait: Amb x 2 laps with SPC, cues for  heel/toe                                                                                                                          DATE: 08/21/22 See HEP below    PATIENT EDUCATION: Education details: Exam findings, POC, initial HEP Person educated: Patient Education method: Explanation, Demonstration, and Handouts Education comprehension: verbalized understanding, returned demonstration, and needs further  education  HOME EXERCISE PROGRAM: Access Code: [email or web address] URL: [email or web address] Date: 08/21/2022 Prepared by: Vernon Prey April Kirstie Peri  Exercises - Wide Tandem Stance with Eyes Open  - 1 x daily - 7 x weekly - 2 sets - 30 sec hold - Prone Press Up On Elbows  - 1 x daily - 7 x weekly - 2 sets - 30 sec hold - Prone Quadriceps Stretch with Strap  - 1 x daily - 7 x weekly - 2 sets - 30 sec hold  GOALS: Goals reviewed with patient? Yes  SHORT TERM GOALS: Target date: 09/18/2022   Pt will be ind with initial HEP Baseline: Goal status: INITIAL  2.  Pt will demo increased quad/hip flexor flexibility with at least 100 deg of knee bend during [place] test Baseline:  Goal status: INITIAL  3.  Pt will be independent with maintaining increased trunk/hip extension in standing Baseline:  Goal status: INITIAL   LONG TERM GOALS: Target date: 10/16/2022   Pt will be ind with management and progression of HEP Baseline:  Goal status: INITIAL  2.  Pt will have increased Berg Balance score of >/=45/56 to demo decreased fall risk Baseline:  Goal status: INITIAL  3.  Pt will demo improved DGI to >/=20/24 to demo decreased fall risk Baseline:  Goal status: INITIAL  4.  Pt will be able to amb ind x400' per pt's personal goals Baseline:  Goal status: INITIAL   ASSESSMENT:  CLINICAL IMPRESSION: Treatment focused on initiating balance and strengthening regimen. Fatigues with increased standing -- required seated rest breaks. Worked on heel/toe pattern with  walking.  OBJECTIVE IMPAIRMENTS: Abnormal gait, decreased activity tolerance, decreased balance, decreased endurance, decreased mobility, difficulty walking, decreased ROM, decreased strength, increased fascial restrictions, increased muscle spasms, impaired flexibility, improper body mechanics, and postural dysfunction.   ACTIVITY LIMITATIONS: standing, squatting, stairs, transfers, and locomotion level  PARTICIPATION LIMITATIONS: meal prep, cleaning, laundry, and community activity  PERSONAL FACTORS: Fitness, Past/current experiences, Profession, and Time since onset of injury/illness/exacerbation are also affecting patient's functional outcome.   REHAB POTENTIAL: Good  CLINICAL DECISION MAKING: Evolving/moderate complexity  EVALUATION COMPLEXITY: Moderate  PLAN:  PT FREQUENCY: 2x/week  PT DURATION: 8 weeks  PLANNED INTERVENTIONS: Therapeutic exercises, Therapeutic activity, Neuromuscular re-education, Balance training, Gait training, Patient/Family education, Self Care, Joint mobilization, Aquatic Therapy, Dry Needling, Electrical stimulation, Spinal mobilization, Cryotherapy, Moist heat, Taping, Vasopneumatic device, Ionotophoresis 4mg /ml Dexamethasone, Manual therapy, and Re-evaluation  PLAN FOR NEXT SESSION: Review HEP. Work on strength, stretching and balance HEP.    Kohana Amble April Ma L Darick Fetters, PT 09/03/2022, 1:18 PM

## 2022-09-09 ENCOUNTER — Ambulatory Visit: Payer: Medicare Other | Admitting: Physical Therapy

## 2022-09-09 ENCOUNTER — Encounter: Payer: Self-pay | Admitting: Physical Therapy

## 2022-09-09 DIAGNOSIS — R2681 Unsteadiness on feet: Secondary | ICD-10-CM | POA: Diagnosis not present

## 2022-09-09 DIAGNOSIS — M25652 Stiffness of left hip, not elsewhere classified: Secondary | ICD-10-CM | POA: Diagnosis not present

## 2022-09-09 DIAGNOSIS — M6281 Muscle weakness (generalized): Secondary | ICD-10-CM | POA: Diagnosis not present

## 2022-09-09 DIAGNOSIS — R2689 Other abnormalities of gait and mobility: Secondary | ICD-10-CM | POA: Diagnosis not present

## 2022-09-09 DIAGNOSIS — R42 Dizziness and giddiness: Secondary | ICD-10-CM | POA: Diagnosis not present

## 2022-09-09 DIAGNOSIS — R293 Abnormal posture: Secondary | ICD-10-CM | POA: Diagnosis not present

## 2022-09-09 NOTE — Therapy (Signed)
OUTPATIENT PHYSICAL THERAPY NEURO EVALUATION   Patient Name: Jacob Mitchell MRN: [id number] DOB:1945/03/14, 77 y.o., male Today's Date: 09/09/2022   PCP: Everrett Coombe, DO REFERRING PROVIDER: Everrett Coombe, DO  END OF SESSION:  PT End of Session - 09/09/22 0846     Visit Number 3    Number of Visits 16    Date for PT Re-Evaluation 10/16/22    Authorization Type Medicare    Progress Note Due on Visit 10    PT Start Time [phone]    PT Stop Time 0930    PT Time Calculation (min) 44 min    Activity Tolerance Patient tolerated treatment well    Behavior During Therapy [facility] for tasks assessed/performed             Past Medical History:  Diagnosis Date   Arm DVT (deep venous thromboembolism), acute, left (HCC) 03/27/2021   Benign localized prostatic hyperplasia with lower urinary tract symptoms (LUTS)    Complication of anesthesia    pt says he gets "restless" when he is waking up and nurses have had to "hold him down"- happened several surgeries ago    Diverticulosis of colon    History of colon polyps    History of kidney stones    History of MRSA infection 2007   post right shoulder surgery   History of urinary retention 05/31/2017   due to BPH   Incomplete emptying of bladder    Left arm swelling 08/21/2020   OA (osteoarthritis)    Peripheral neuropathy    feet-- hx frost bite   Pinched nerve in neck    right index and middle fingers numbness   Wears glasses    Wears hearing aid in both ears    Past Surgical History:  Procedure Laterality Date   CATARACT EXTRACTION Left 01/13/2020   COLONOSCOPY     CYST REMOVAL LEG Right 1975   CYSTOSCOPY WITH INSERTION OF UROLIFT N/A 07/20/2017   Procedure: CYSTOSCOPY WITH INSERTION OF UROLIFT;  Surgeon: Malen Gauze, MD;  Location: [facility];  Service: Urology;  Laterality: N/A;   CYSTOSCOPY WITH INSERTION OF UROLIFT     DISTAL CLAVICLE EXCISION Left 1977   left shoulder   ELBOW SURGERY Right 1969;  1980    HAND SURGERY Right 1968   INCISION AND DRAINAGE HIP Left 05/01/2020   Procedure: IRRIGATION AND DEBRIDEMENT LEFT HIP SEROMA;  Surgeon: Durene Romans, MD;  Location: [facility];  Service: Orthopedics;  Laterality: Left;  90 mins   LAMINECTOMY  1970   L4-5   LUMBAR FUSION  2013  approx.   L1--L5   ORIF HIP FRACTURE Left 01/17/2020   Procedure: OPEN REDUCTION INTERNAL FIXATION LEFT HIP GREATER TROCHANTER FRACTURE;  Surgeon: Durene Romans, MD;  Location: [facility];  Service: Orthopedics;  Laterality: Left;  90 MINS   ORIF PERIPROSTHETIC FRACTURE Left 02/27/2020   Procedure: OPEN REDUCTION INTERNAL FIXATION (ORIF) LEFT PERI-PROSTETIC  HIP FRACTURE;  Surgeon: Durene Romans, MD;  Location: [facility];  Service: Orthopedics;  Laterality: Left;    ROTATOR CUFF REPAIR Right x5    last one 2007   5 surgeries in 9 days--- original repair then 4 sx'  I&D sx's for MRSA infection   SHOULDER ARTHROSCOPY WITH DISTAL CLAVICLE RESECTION Left 1977   SHOULDER SURGERY Left 1976   TOTAL HIP ARTHROPLASTY Left 11/23/2015   Procedure: LEFT TOTAL HIP ARTHROPLASTY ANTERIOR APPROACH;  Surgeon: Durene Romans, MD;  Location: [facility];  Service: Orthopedics;  Laterality: Left;   TOTAL HIP REVISION     10/14/16 Dr. 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   Coronary artery disease involving native coronary artery without angina pectoris 08/07/2022   Agatston CAC score 200-399 08/07/2022   Carpal tunnel syndrome on both sides 08/07/2022   Screening for lipid disorders 06/24/2021   Fracture of greater trochanter (HCC)  01/17/2020   Obese 10/15/2016   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/14/2016   History of nephrolithiasis 07/24/2016   Neuropathy 07/24/2016   Calculus of kidney 07/24/2016   History of repair of hip joint 11/23/2015    ONSET DATE: Chronic  REFERRING DIAG:  R42 (ICD-10-CM) - Dizziness  M25.551,M25.552,G89.29 (ICD-10-CM) - Chronic pain of both hips  M25.511,G89.29,M25.512 (ICD-10-CM) - Chronic pain of both shoulders    THERAPY DIAG:  Abnormal posture  Muscle weakness (generalized)  Stiffness of left hip, not elsewhere classified  Other abnormalities of gait and mobility  Unsteadiness on feet  Rationale for Evaluation and Treatment: Rehabilitation  SUBJECTIVE:                                                                                                                                                                                             SUBJECTIVE STATEMENT: Pt reports compliance to HEP. Has been walking short distances at home without cane.  Pt accompanied by: self  PERTINENT HISTORY: Last hip surgery 1.5 years ago  PAIN:  Are you having pain? No  PRECAUTIONS: Fall  RED FLAGS: None   WEIGHT BEARING RESTRICTIONS: No  FALLS: Has patient fallen in last 6 months? Yes. Number of falls 1 slid out of bed  LIVING ENVIRONMENT: Lives with: lives with their spouse Lives in: House/apartment Stairs: No Has following equipment at home: None  PLOF: Independent  PATIENT GOALS: Get off using cane  OBJECTIVE:   DIAGNOSTIC FINDINGS: Nothing recent on file  COGNITION: Overall cognitive status: Within functional limits for tasks assessed   SENSATION: A little neuropathy on outside of both feet   MUSCLE LENGTH: Maisie Fus test: Right -10 deg; Left -10 deg Quad: R 90 deg, L 90 deg  POSTURE: rounded shoulders, increased thoracic kyphosis, flexed trunk , weight shift right, and  L foot abducted, L iliac crest elevated compared to R, slight L knee bend  LOWER EXTREMITY  ROM:     Active  Right Eval Left Eval  Hip flexion    Hip extension    Hip abduction    Hip adduction    Hip internal rotation    Hip external rotation    Knee flexion    Knee extension    Ankle dorsiflexion    Ankle plantarflexion    Ankle inversion    Ankle eversion     (Blank rows = not tested)  LOWER EXTREMITY MMT:    MMT Right Eval Left Eval  Hip flexion 4- 4-  Hip extension 3- 2+  Hip abduction 4+ 3-  Hip adduction    Hip internal rotation 5 3  Hip external rotation 4+ 3  Knee flexion 5 5  Knee extension 5 5  Ankle dorsiflexion    Ankle plantarflexion    Ankle inversion    Ankle eversion    (Blank rows = not tested)  BED MOBILITY:  Sit to supine SBA Supine to sit SBA Rolling to Right SBA Rolling to Left SBA   STAIRS: Level of Assistance: SBA Stair Negotiation Technique: Alternating Pattern  with Bilateral Rails Number of Stairs: 5  Height of Stairs: 4-6"   GAIT: Gait pattern:  L foot slap, L foot externally rotates during swing, wide BOS, leans on cane on R with R shoulder hiked/elevated, antalgic Distance walked: Within clinic Assistive device utilized: Single point cane Level of assistance: SBA  FUNCTIONAL TESTS:  5 times sit to stand: 18.11 sec Berg Balance Scale: 38/56 Dynamic Gait Index: 19/24  PATIENT SURVEYS:  TBA  TODAY'S TREATMENT:       [facility] Adult PT Treatment:                                                DATE: 09/09/22 Therapeutic Exercise: Nustep L5 x 5 min LEs only Seated hamstring stretch x 30 sec R&L Seated figure 4 stretch x 30 sec R&L Seated hip flexor stretch 2x 30 sec R&L Neuromuscular re-ed: Staggered stance 3x30 sec R&L Staggered stance forward/backward weight shift 2x10 R&L Feet together head rotations/head nods 2x30 sec Gait: Heel/toe swing phase 2x10 R&L    [facility] Adult PT Treatment:                                                DATE: 09/03/22 Therapeutic Exercise: Seated hamstring stretch x 30 sec  R&L Seated figure 4 stretch x 30 sec R&L Seated hip flexor stretch 2x 30 sec R&L Standing glute set 2x10 Standing heel raise 3x10 Standing hip abduction toe touch 2x10 Standing hip ext toe touch 2x10 Neuromuscular re-ed: Staggered stance 2x30 sec Feet apart L<>R weight shifting x30 Feet together eyes open head rotation 2x30 sec Feet together eyes open head nods 2x30 sec Gait: Amb x 2 laps with SPC, cues for heel/toe  DATE: 08/21/22 See HEP below    PATIENT EDUCATION: Education details: Exam findings, POC, initial HEP Person educated: Patient Education method: Explanation, Demonstration, and Handouts Education comprehension: verbalized understanding, returned demonstration, and needs further education  HOME EXERCISE PROGRAM: Access Code: [email or web address] URL: [email or web address] Date: 08/21/2022 Prepared by: Vernon Prey April Kirstie Peri  Exercises - Wide Tandem Stance with Eyes Open  - 1 x daily - 7 x weekly - 2 sets - 30 sec hold - Prone Press Up On Elbows  - 1 x daily - 7 x weekly - 2 sets - 30 sec hold - Prone Quadriceps Stretch with Strap  - 1 x daily - 7 x weekly - 2 sets - 30 sec hold  GOALS: Goals reviewed with patient? Yes  SHORT TERM GOALS: Target date: 09/18/2022   Pt will be ind with initial HEP Baseline: Goal status: INITIAL  2.  Pt will demo increased quad/hip flexor flexibility with at least 100 deg of knee bend during [place] test Baseline:  Goal status: INITIAL  3.  Pt will be independent with maintaining increased trunk/hip extension in standing Baseline:  Goal status: INITIAL   LONG TERM GOALS: Target date: 10/16/2022   Pt will be ind with management and progression of HEP Baseline:  Goal status: INITIAL  2.  Pt will have increased Berg Balance score of >/=45/56 to demo decreased fall risk Baseline:  Goal status:  INITIAL  3.  Pt will demo improved DGI to >/=20/24 to demo decreased fall risk Baseline:  Goal status: INITIAL  4.  Pt will be able to amb ind x400' per pt's personal goals Baseline:  Goal status: INITIAL   ASSESSMENT:  CLINICAL IMPRESSION: Worked on progressing stability with gait. Requires UE support for stability with weight shifting forward/backward in a staggered stance. Improved stability noted with feet together and head turns.   OBJECTIVE IMPAIRMENTS: Abnormal gait, decreased activity tolerance, decreased balance, decreased endurance, decreased mobility, difficulty walking, decreased ROM, decreased strength, increased fascial restrictions, increased muscle spasms, impaired flexibility, improper body mechanics, and postural dysfunction.   PLAN:  PT FREQUENCY: 2x/week  PT DURATION: 8 weeks  PLANNED INTERVENTIONS: Therapeutic exercises, Therapeutic activity, Neuromuscular re-education, Balance training, Gait training, Patient/Family education, Self Care, Joint mobilization, Aquatic Therapy, Dry Needling, Electrical stimulation, Spinal mobilization, Cryotherapy, Moist heat, Taping, Vasopneumatic device, Ionotophoresis 4mg /ml Dexamethasone, Manual therapy, and Re-evaluation  PLAN FOR NEXT SESSION: Review HEP. Work on strength, stretching and balance HEP.    Keil Pickering April Ma L Sinclair [place]ood, PT 09/09/2022, 8:46 AM

## 2022-09-11 ENCOUNTER — Encounter: Payer: Self-pay | Admitting: Physical Therapy

## 2022-09-11 ENCOUNTER — Ambulatory Visit: Payer: Medicare Other | Attending: Family Medicine | Admitting: Physical Therapy

## 2022-09-11 DIAGNOSIS — R293 Abnormal posture: Secondary | ICD-10-CM | POA: Insufficient documentation

## 2022-09-11 DIAGNOSIS — M25652 Stiffness of left hip, not elsewhere classified: Secondary | ICD-10-CM | POA: Insufficient documentation

## 2022-09-11 DIAGNOSIS — R2681 Unsteadiness on feet: Secondary | ICD-10-CM | POA: Diagnosis not present

## 2022-09-11 DIAGNOSIS — M6281 Muscle weakness (generalized): Secondary | ICD-10-CM | POA: Diagnosis not present

## 2022-09-11 DIAGNOSIS — R2689 Other abnormalities of gait and mobility: Secondary | ICD-10-CM | POA: Diagnosis not present

## 2022-09-11 NOTE — Therapy (Signed)
OUTPATIENT PHYSICAL THERAPY NEURO TREATMENT   Patient Name: Jacob Mitchell MRN: [id number] DOB:12-18-1945, 77 y.o., male Today's Date: 09/11/2022   PCP: Everrett Coombe, DO REFERRING PROVIDER: Everrett Coombe, DO  END OF SESSION:  PT End of Session - 09/11/22 1312     Visit Number 4    Number of Visits 16    Date for PT Re-Evaluation 10/16/22    Authorization Type Medicare    Progress Note Due on Visit 10    PT Start Time 1315    PT Stop Time 1400    PT Time Calculation (min) 45 min    Activity Tolerance Patient tolerated treatment well    Behavior During Therapy [facility] for tasks assessed/performed              Past Medical History:  Diagnosis Date   Arm DVT (deep venous thromboembolism), acute, left (HCC) 03/27/2021   Benign localized prostatic hyperplasia with lower urinary tract symptoms (LUTS)    Complication of anesthesia    pt says he gets "restless" when he is waking up and nurses have had to "hold him down"- happened several surgeries ago    Diverticulosis of colon    History of colon polyps    History of kidney stones    History of MRSA infection 2007   post right shoulder surgery   History of urinary retention 05/31/2017   due to BPH   Incomplete emptying of bladder    Left arm swelling 08/21/2020   OA (osteoarthritis)    Peripheral neuropathy    feet-- hx frost bite   Pinched nerve in neck    right index and middle fingers numbness   Wears glasses    Wears hearing aid in both ears    Past Surgical History:  Procedure Laterality Date   CATARACT EXTRACTION Left 01/13/2020   COLONOSCOPY     CYST REMOVAL LEG Right 1975   CYSTOSCOPY WITH INSERTION OF UROLIFT N/A 07/20/2017   Procedure: CYSTOSCOPY WITH INSERTION OF UROLIFT;  Surgeon: Malen Gauze, MD;  Location: [facility];  Service: Urology;  Laterality: N/A;   CYSTOSCOPY WITH INSERTION OF UROLIFT     DISTAL CLAVICLE EXCISION Left 1977   left shoulder   ELBOW SURGERY Right 1969;  1980    HAND SURGERY Right 1968   INCISION AND DRAINAGE HIP Left 05/01/2020   Procedure: IRRIGATION AND DEBRIDEMENT LEFT HIP SEROMA;  Surgeon: Durene Romans, MD;  Location: [facility];  Service: Orthopedics;  Laterality: Left;  90 mins   LAMINECTOMY  1970   L4-5   LUMBAR FUSION  2013  approx.   L1--L5   ORIF HIP FRACTURE Left 01/17/2020   Procedure: OPEN REDUCTION INTERNAL FIXATION LEFT HIP GREATER TROCHANTER FRACTURE;  Surgeon: Durene Romans, MD;  Location: [facility];  Service: Orthopedics;  Laterality: Left;  90 MINS   ORIF PERIPROSTHETIC FRACTURE Left 02/27/2020   Procedure: OPEN REDUCTION INTERNAL FIXATION (ORIF) LEFT PERI-PROSTETIC  HIP FRACTURE;  Surgeon: Durene Romans, MD;  Location: [facility];  Service: Orthopedics;  Laterality: Left;    ROTATOR CUFF REPAIR Right x5    last one 2007   5 surgeries in 9 days--- original repair then 4 sx'  I&D sx's for MRSA infection   SHOULDER ARTHROSCOPY WITH DISTAL CLAVICLE RESECTION Left 1977   SHOULDER SURGERY Left 1976   TOTAL HIP ARTHROPLASTY Left 11/23/2015   Procedure: LEFT TOTAL HIP ARTHROPLASTY ANTERIOR APPROACH;  Surgeon: Durene Romans, MD;  Location: [facility];  Service: Orthopedics;  Laterality: Left;   TOTAL HIP REVISION     10/14/16 Dr. 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   Coronary artery disease involving native coronary artery without angina pectoris 08/07/2022   Agatston CAC score 200-399 08/07/2022   Carpal tunnel syndrome on both sides 08/07/2022   Screening for lipid disorders 06/24/2021   Fracture of greater trochanter (HCC)  01/17/2020   Obese 10/15/2016   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/14/2016   History of nephrolithiasis 07/24/2016   Neuropathy 07/24/2016   Calculus of kidney 07/24/2016   History of repair of hip joint 11/23/2015    ONSET DATE: Chronic  REFERRING DIAG:  R42 (ICD-10-CM) - Dizziness  M25.551,M25.552,G89.29 (ICD-10-CM) - Chronic pain of both hips  M25.511,G89.29,M25.512 (ICD-10-CM) - Chronic pain of both shoulders    THERAPY DIAG:  Abnormal posture  Muscle weakness (generalized)  Stiffness of left hip, not elsewhere classified  Other abnormalities of gait and mobility  Unsteadiness on feet  Rationale for Evaluation and Treatment: Rehabilitation  SUBJECTIVE:                                                                                                                                                                                             SUBJECTIVE STATEMENT: Pt states he's been trying to walk less without cane. Comes into clinic today without SPC (forgot it at home).  Pt accompanied by: self  PERTINENT HISTORY: Last hip surgery 1.5 years ago; L hip surgery x 5, R shoulder surgery  PAIN:  Are you having pain? No  PRECAUTIONS: Fall  WEIGHT BEARING RESTRICTIONS: No  FALLS: Has patient fallen in last 6 months? Yes. Number of falls 1 slid out of bed  LIVING ENVIRONMENT: Lives with: lives with their spouse Lives in: House/apartment Stairs: No Has following equipment at home: None  PLOF: Independent  PATIENT GOALS: Get off using cane  OBJECTIVE:   DIAGNOSTIC FINDINGS: Nothing recent on file  COGNITION: Overall cognitive status: Within functional limits for tasks assessed   SENSATION: A little neuropathy on outside of both feet  MUSCLE LENGTH: Maisie Fus test: Right -10 deg; Left -10 deg Quad: R 90 deg, L 90 deg  POSTURE: rounded shoulders, increased thoracic  kyphosis, flexed trunk , weight shift right, and L foot abducted, L iliac crest elevated compared to  R, slight L knee bend  LOWER EXTREMITY ROM:     Active  Right Eval Left Eval  Hip flexion    Hip extension    Hip abduction    Hip adduction    Hip internal rotation    Hip external rotation    Knee flexion    Knee extension    Ankle dorsiflexion    Ankle plantarflexion    Ankle inversion    Ankle eversion     (Blank rows = not tested)  LOWER EXTREMITY MMT:    MMT Right Eval Left Eval  Hip flexion 4- 4-  Hip extension 3- 2+  Hip abduction 4+ 3-  Hip adduction    Hip internal rotation 5 3  Hip external rotation 4+ 3  Knee flexion 5 5  Knee extension 5 5  Ankle dorsiflexion    Ankle plantarflexion    Ankle inversion    Ankle eversion    (Blank rows = not tested)  BED MOBILITY:  Sit to supine SBA Supine to sit SBA Rolling to Right SBA Rolling to Left SBA   STAIRS: Level of Assistance: SBA Stair Negotiation Technique: Alternating Pattern  with Bilateral Rails Number of Stairs: 5  Height of Stairs: 4-6"   GAIT: Gait pattern:  L foot slap, L foot externally rotates during swing, wide BOS, leans on cane on R with R shoulder hiked/elevated, antalgic Distance walked: Within clinic Assistive device utilized: Single point cane Level of assistance: SBA  FUNCTIONAL TESTS:  5 times sit to stand: 18.11 sec Berg Balance Scale: 38/56 Dynamic Gait Index: 19/24  PATIENT SURVEYS:  TBA  TODAY'S TREATMENT:     [facility] Adult PT Treatment:                                                DATE: 09/11/22 Therapeutic Exercise: Nustep L5 x 5 min LEs only Prone on forearms x1 min Prone quad stretch with strap 2x 30 sec Prone hip ext x10 Prone hip abd 2x10 Sidelying clamshell 2x10 Neuromuscular re-ed: Alternating step tap 4" step 2x10 One foot on 4" step x30 sec One foot on 4" step diagonals red med ball 2x10 Gait: Staggered stance weight shift forward/backward x10     [facility] Adult PT Treatment:                                                DATE: 09/09/22 Therapeutic  Exercise: Nustep L5 x 5 min LEs only Seated hamstring stretch x 30 sec R&L Seated figure 4 stretch x 30 sec R&L Seated hip flexor stretch 2x 30 sec R&L Neuromuscular re-ed: Staggered stance 3x30 sec R&L Staggered stance forward/backward weight shift 2x10 R&L Feet together head rotations/head nods 2x30 sec Gait: Heel/toe swing phase 2x10 R&L    [facility] Adult PT Treatment:                                                DATE: 09/03/22 Therapeutic Exercise: Seated hamstring stretch x  30 sec R&L Seated figure 4 stretch x 30 sec R&L Seated hip flexor stretch 2x 30 sec R&L Standing glute set 2x10 Standing heel raise 3x10 Standing hip abduction toe touch 2x10 Standing hip ext toe touch 2x10 Neuromuscular re-ed: Staggered stance 2x30 sec Feet apart L<>R weight shifting x30 Feet together eyes open head rotation 2x30 sec Feet together eyes open head nods 2x30 sec Gait: Amb x 2 laps with SPC, cues for heel/toe                                                                                                                          DATE: 08/21/22 See HEP below    PATIENT EDUCATION: Education details: Exam findings, POC, initial HEP Person educated: Patient Education method: Explanation, Demonstration, and Handouts Education comprehension: verbalized understanding, returned demonstration, and needs further education  HOME EXERCISE PROGRAM: Access Code: [email or web address] URL: [email or web address] Date: 08/21/2022 Prepared by: Vernon Prey April Kirstie Peri  Exercises - Wide Tandem Stance with Eyes Open  - 1 x daily - 7 x weekly - 2 sets - 30 sec hold - Prone Press Up On Elbows  - 1 x daily - 7 x weekly - 2 sets - 30 sec hold - Prone Quadriceps Stretch with Strap  - 1 x daily - 7 x weekly - 2 sets - 30 sec hold  GOALS: Goals reviewed with patient? Yes  SHORT TERM GOALS: Target date: 09/18/2022   Pt will be ind with initial HEP Baseline: Goal status: INITIAL  2.  Pt will demo  increased quad/hip flexor flexibility with at least 100 deg of knee bend during [place] test Baseline:  Goal status: INITIAL  3.  Pt will be independent with maintaining increased trunk/hip extension in standing Baseline:  Goal status: INITIAL   LONG TERM GOALS: Target date: 10/16/2022   Pt will be ind with management and progression of HEP Baseline:  Goal status: INITIAL  2.  Pt will have increased Berg Balance score of >/=45/56 to demo decreased fall risk Baseline:  Goal status: INITIAL  3.  Pt will demo improved DGI to >/=20/24 to demo decreased fall risk Baseline:  Goal status: INITIAL  4.  Pt will be able to amb ind x400' per pt's personal goals Baseline:  Goal status: INITIAL   ASSESSMENT:  CLINICAL IMPRESSION: Session primarily focused on improving strength this session. Pt very challenged in prone position. Continued to work on single leg stability/standing with good pt tolerance. Pt to get carpal tunnel surgery tomorrow -- discussed checking with his doctor about any weight bearing precautions on his wrist prior to coming back to PT.   OBJECTIVE IMPAIRMENTS: Abnormal gait, decreased activity tolerance, decreased balance, decreased endurance, decreased mobility, difficulty walking, decreased ROM, decreased strength, increased fascial restrictions, increased muscle spasms, impaired flexibility, improper body mechanics, and postural dysfunction.   PLAN:  PT FREQUENCY: 2x/week  PT DURATION: 8 weeks  PLANNED INTERVENTIONS: Therapeutic exercises, Therapeutic activity, Neuromuscular re-education, Balance training, Gait training, Patient/Family education,  Self Care, Joint mobilization, Aquatic Therapy, Dry Needling, Electrical stimulation, Spinal mobilization, Cryotherapy, Moist heat, Taping, Vasopneumatic device, Ionotophoresis 4mg /ml Dexamethasone, Manual therapy, and Re-evaluation  PLAN FOR NEXT SESSION: Review HEP. Work on strength, stretching and balance HEP.    Emon Miggins  April Ma L Maudine Kluesner, PT 09/11/2022, 1:13 PM

## 2022-09-12 DIAGNOSIS — M67833 Other specified disorders of tendon, right wrist: Secondary | ICD-10-CM | POA: Diagnosis not present

## 2022-09-12 DIAGNOSIS — G5601 Carpal tunnel syndrome, right upper limb: Secondary | ICD-10-CM | POA: Diagnosis not present

## 2022-09-12 DIAGNOSIS — M65841 Other synovitis and tenosynovitis, right hand: Secondary | ICD-10-CM | POA: Diagnosis not present

## 2022-09-12 DIAGNOSIS — M62541 Muscle wasting and atrophy, not elsewhere classified, right hand: Secondary | ICD-10-CM | POA: Diagnosis not present

## 2022-09-18 ENCOUNTER — Encounter: Payer: Medicare Other | Admitting: Physical Therapy

## 2022-09-25 DIAGNOSIS — Z4789 Encounter for other orthopedic aftercare: Secondary | ICD-10-CM | POA: Diagnosis not present

## 2022-09-29 NOTE — Therapy (Signed)
OUTPATIENT PHYSICAL THERAPY NEURO TREATMENT   Patient Name: Jacob Mitchell MRN: [id number] DOB:07/21/1945, 77 y.o., male Today's Date: 09/30/2022   PCP: Everrett Coombe, DO REFERRING PROVIDER: Everrett Coombe, DO  END OF SESSION:  PT End of Session - 09/30/22 1056     Visit Number 5    Number of Visits 16    Date for PT Re-Evaluation 10/16/22    Authorization Type Medicare    Progress Note Due on Visit 10    PT Start Time 1100    PT Stop Time 1144    PT Time Calculation (min) 44 min    Activity Tolerance Patient tolerated treatment well    Behavior During Therapy [facility] for tasks assessed/performed               Past Medical History:  Diagnosis Date   Arm DVT (deep venous thromboembolism), acute, left (HCC) 03/27/2021   Benign localized prostatic hyperplasia with lower urinary tract symptoms (LUTS)    Complication of anesthesia    pt says he gets "restless" when he is waking up and nurses have had to "hold him down"- happened several surgeries ago    Diverticulosis of colon    History of colon polyps    History of kidney stones    History of MRSA infection 2007   post right shoulder surgery   History of urinary retention 05/31/2017   due to BPH   Incomplete emptying of bladder    Left arm swelling 08/21/2020   OA (osteoarthritis)    Peripheral neuropathy    feet-- hx frost bite   Pinched nerve in neck    right index and middle fingers numbness   Wears glasses    Wears hearing aid in both ears    Past Surgical History:  Procedure Laterality Date   CATARACT EXTRACTION Left 01/13/2020   COLONOSCOPY     CYST REMOVAL LEG Right 1975   CYSTOSCOPY WITH INSERTION OF UROLIFT N/A 07/20/2017   Procedure: CYSTOSCOPY WITH INSERTION OF UROLIFT;  Surgeon: Malen Gauze, MD;  Location: [facility];  Service: Urology;  Laterality: N/A;   CYSTOSCOPY WITH INSERTION OF UROLIFT     DISTAL CLAVICLE EXCISION Left 1977   left shoulder   ELBOW SURGERY Right 1969;   1980   HAND SURGERY Right 1968   INCISION AND DRAINAGE HIP Left 05/01/2020   Procedure: IRRIGATION AND DEBRIDEMENT LEFT HIP SEROMA;  Surgeon: Durene Romans, MD;  Location: [facility];  Service: Orthopedics;  Laterality: Left;  90 mins   LAMINECTOMY  1970   L4-5   LUMBAR FUSION  2013  approx.   L1--L5   ORIF HIP FRACTURE Left 01/17/2020   Procedure: OPEN REDUCTION INTERNAL FIXATION LEFT HIP GREATER TROCHANTER FRACTURE;  Surgeon: Durene Romans, MD;  Location: [facility];  Service: Orthopedics;  Laterality: Left;  90 MINS   ORIF PERIPROSTHETIC FRACTURE Left 02/27/2020   Procedure: OPEN REDUCTION INTERNAL FIXATION (ORIF) LEFT PERI-PROSTETIC  HIP FRACTURE;  Surgeon: Durene Romans, MD;  Location: [facility];  Service: Orthopedics;  Laterality: Left;    ROTATOR CUFF REPAIR Right x5    last one 2007   5 surgeries in 9 days--- original repair then 4 sx'  I&D sx's for MRSA infection   SHOULDER ARTHROSCOPY WITH DISTAL CLAVICLE RESECTION Left 1977   SHOULDER SURGERY Left 1976   TOTAL HIP ARTHROPLASTY Left 11/23/2015   Procedure: LEFT TOTAL HIP ARTHROPLASTY ANTERIOR APPROACH;  Surgeon: Durene Romans, MD;  Location: [facility];  Service:  Orthopedics;  Laterality: Left;   TOTAL HIP REVISION     10/14/16 Dr. 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   Coronary artery disease involving native coronary artery without angina pectoris 08/07/2022   Agatston CAC score 200-399 08/07/2022   Carpal tunnel syndrome on both sides 08/07/2022   Screening for lipid disorders 06/24/2021   Fracture of greater trochanter (HCC)  01/17/2020   Obese 10/15/2016   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/14/2016   History of nephrolithiasis 07/24/2016   Neuropathy 07/24/2016   Calculus of kidney 07/24/2016   History of repair of hip joint 11/23/2015    ONSET DATE: Chronic  REFERRING DIAG:  R42 (ICD-10-CM) - Dizziness  M25.551,M25.552,G89.29 (ICD-10-CM) - Chronic pain of both hips  M25.511,G89.29,M25.512 (ICD-10-CM) - Chronic pain of both shoulders    THERAPY DIAG:  Abnormal posture  Muscle weakness (generalized)  Stiffness of left hip, not elsewhere classified  Other abnormalities of gait and mobility  Unsteadiness on feet  Rationale for Evaluation and Treatment: Rehabilitation  SUBJECTIVE:                                                                                                                                                                                             SUBJECTIVE STATEMENT: Patient presents post CTS surgery with cast on R wrist. Comes off in 2 weeks.  My balance is better. Not using in the house, but still bring in the community.  Pt accompanied by: self  PERTINENT HISTORY: Last hip surgery 1.5 years ago; L hip surgery x 5, R shoulder surgery  PAIN:  Are you having pain? No  PRECAUTIONS: Fall  WEIGHT BEARING RESTRICTIONS: No  FALLS: Has patient fallen in last 6 months? Yes. Number of falls 1 slid out of bed  LIVING ENVIRONMENT: Lives with: lives with their spouse Lives in: House/apartment Stairs: No Has following equipment at home: None  PLOF: Independent  PATIENT GOALS: Get off using cane  OBJECTIVE:   DIAGNOSTIC FINDINGS: Nothing recent on file  COGNITION: Overall cognitive status: Within functional limits for tasks assessed   SENSATION: A little neuropathy on outside of both feet  MUSCLE LENGTH: Maisie Fus test: Right -10 deg; Left -10 deg  Quad: R 90 deg, L 90 deg   09/30/22  Quad: B 95 deg L 100 deg passive, R 98 deg passive  POSTURE: rounded shoulders, increased  thoracic kyphosis, flexed trunk , weight shift right, and L foot abducted, L iliac crest elevated compared to R, slight L knee bend  LOWER EXTREMITY ROM:     Active  Right Eval Left Eval  Hip flexion    Hip extension    Hip abduction    Hip adduction    Hip internal rotation    Hip external rotation    Knee flexion    Knee extension    Ankle dorsiflexion    Ankle plantarflexion    Ankle inversion    Ankle eversion     (Blank rows = not tested)  LOWER EXTREMITY MMT:    MMT Right Eval Left Eval  Hip flexion 4- 4-  Hip extension 3- 2+  Hip abduction 4+ 3-  Hip adduction    Hip internal rotation 5 3  Hip external rotation 4+ 3  Knee flexion 5 5  Knee extension 5 5  Ankle dorsiflexion    Ankle plantarflexion    Ankle inversion    Ankle eversion    (Blank rows = not tested)  BED MOBILITY:  Sit to supine SBA Supine to sit SBA Rolling to Right SBA Rolling to Left SBA   STAIRS: Level of Assistance: SBA Stair Negotiation Technique: Alternating Pattern  with Bilateral Rails Number of Stairs: 5  Height of Stairs: 4-6"   GAIT: Gait pattern:  L foot slap, L foot externally rotates during swing, wide BOS, leans on cane on R with R shoulder hiked/elevated, antalgic Distance walked: Within clinic Assistive device utilized: Single point cane Level of assistance: SBA  FUNCTIONAL TESTS:  5 times sit to stand: 18.11 sec Berg Balance Scale: 38/56 Dynamic Gait Index: 19/24  [facility] PT Assessment - 09/30/22 0001       Berg Balance Test   Sit to Stand Able to stand without using hands and stabilize independently    Standing Unsupported Able to stand safely 2 minutes    Sitting with Back Unsupported but Feet Supported on Floor or Stool Able to sit safely and securely 2 minutes    Stand to Sit Sits safely with minimal use of hands    Transfers Able to transfer safely, minor use of hands    Standing Unsupported with Eyes Closed Able to stand 10 seconds safely    Standing  Unsupported with Feet Together Able to place feet together independently and stand 1 minute safely    From Standing, Reach Forward with Outstretched Arm Can reach confidently >25 cm (10")    From Standing Position, Pick up Object from Floor Able to pick up shoe safely and easily    From Standing Position, Turn to Look Behind Over each Shoulder Looks behind from both sides and weight shifts well    Turn 360 Degrees Able to turn 360 degrees safely but slowly    Standing Unsupported, Alternately Place Feet on Step/Stool Able to complete 4 steps without aid or supervision    Standing Unsupported, One Foot in Front Able to take small step independently and hold 30 seconds    Standing on One Leg Unable to try or needs assist to prevent fall    Total Score 46    Berg comment: 46/56      Dynamic Gait Index   Level Surface Mild Impairment    Change in Gait Speed  Normal    Gait with Horizontal Head Turns Normal    Gait with Vertical Head Turns Mild Impairment    Gait and Pivot Turn Normal    Step Over Obstacle Normal    Step Around Obstacles Normal    Steps Moderate Impairment    Total Score 20    DGI comment: 20/24            PATIENT SURVEYS:  TBA  TODAY'S TREATMENT:      [facility] Adult PT Treatment:                                                DATE: 09/30/22 Therapeutic Exercise: Nustep L5 x 6 min LEs only    Therapeutic Activities BERG and DGI completed Gait: Gait x 400 ft no AD  [facility] Adult PT Treatment:                                                DATE: 09/11/22 Therapeutic Exercise: Nustep L5 x 5 min LEs only Prone on forearms x1 min Prone quad stretch with strap 2x 30 sec Prone hip ext x10 Prone hip abd 2x10 Sidelying clamshell 2x10 Neuromuscular re-ed: Alternating step tap 4" step 2x10 One foot on 4" step x30 sec One foot on 4" step diagonals red med ball 2x10 Gait: Staggered stance weight shift forward/backward x10     [facility] Adult PT Treatment:                                                 DATE: 09/09/22 Therapeutic Exercise: Nustep L5 x 5 min LEs only Seated hamstring stretch x 30 sec R&L Seated figure 4 stretch x 30 sec R&L Seated hip flexor stretch 2x 30 sec R&L Neuromuscular re-ed: Staggered stance 3x30 sec R&L Staggered stance forward/backward weight shift 2x10 R&L Feet together head rotations/head nods 2x30 sec Gait: Heel/toe swing phase 2x10 R&L    [facility] Adult PT Treatment:                                                DATE: 09/03/22 Therapeutic Exercise: Seated hamstring stretch x 30 sec R&L Seated figure 4 stretch x 30 sec R&L Seated hip flexor stretch 2x 30 sec R&L Standing glute set 2x10 Standing heel raise 3x10 Standing hip abduction toe touch 2x10 Standing hip ext toe touch 2x10 Neuromuscular re-ed: Staggered stance 2x30 sec Feet apart L<>R weight shifting x30 Feet together eyes open head rotation 2x30 sec Feet together eyes open head nods 2x30 sec Gait: Amb x 2 laps with SPC, cues for heel/toe  DATE: 08/21/22 See HEP below    PATIENT EDUCATION: Education details: Exam findings, POC, initial HEP Person educated: Patient Education method: Explanation, Demonstration, and Handouts Education comprehension: verbalized understanding, returned demonstration, and needs further education  HOME EXERCISE PROGRAM: Access Code: [email or web address] URL: [email or web address] Date: 08/21/2022 Prepared by: Vernon Prey April Kirstie Peri  Exercises - Wide Tandem Stance with Eyes Open  - 1 x daily - 7 x weekly - 2 sets - 30 sec hold - Prone Press Up On Elbows  - 1 x daily - 7 x weekly - 2 sets - 30 sec hold - Prone Quadriceps Stretch with Strap  - 1 x daily - 7 x weekly - 2 sets - 30 sec hold  GOALS: Goals reviewed with patient? Yes  SHORT TERM GOALS: Target date: 09/18/2022 extended to    Pt will be ind with  initial HEP Baseline: Goal status: MET  2.  Pt will demo increased quad/hip flexor flexibility with at least 100 deg of knee bend during [place] test Baseline:  Goal status: IN PROGRESS 09/30/22  3.  Pt will be independent with maintaining increased trunk/hip extension in standing Baseline:  Goal status: IN PROGRESS   LONG TERM GOALS: Target date: 10/16/2022   Pt will be ind with management and progression of HEP Baseline:  Goal status: IN PROGRESS  2.  Pt will have increased Berg Balance score of >/=45/56 to demo decreased fall risk Baseline:  Goal status: MET  3.  Pt will demo improved DGI to >/=20/24 to demo decreased fall risk Baseline:  Goal status: MET  4.  Pt will be able to amb ind x400' per pt's personal goals Baseline:  Goal status: MET   ASSESSMENT:  CLINICAL IMPRESSION: MALACKI VENIER presents today after 3 week absence due to R CTS. Goals were reassessed. He has met hIs BERG goal coring 46/56 today and his DGI goal scoring 20/24. He actually did worse on stairs today, but improved with other areas of gait. He used a step to pattern on the stairs and B UE support. His flexibility has improved some, but his quads are still 5 deg short of his goal. Posturally he is demonstrating improved posture overall but still demonstrates hip and trunk flexion. Theodoro Grist continues to demonstrate potential for improvement and would benefit from continued skilled therapy to address impairments.    OBJECTIVE IMPAIRMENTS: Abnormal gait, decreased activity tolerance, decreased balance, decreased endurance, decreased mobility, difficulty walking, decreased ROM, decreased strength, increased fascial restrictions, increased muscle spasms, impaired flexibility, improper body mechanics, and postural dysfunction.   PLAN:  PT FREQUENCY: 2x/week  PT DURATION: 8 weeks  PLANNED INTERVENTIONS: Therapeutic exercises, Therapeutic activity, Neuromuscular re-education, Balance training, Gait training,  Patient/Family education, Self Care, Joint mobilization, Aquatic Therapy, Dry Needling, Electrical stimulation, Spinal mobilization, Cryotherapy, Moist heat, Taping, Vasopneumatic device, Ionotophoresis 4mg /ml Dexamethasone, Manual therapy, and Re-evaluation  PLAN FOR NEXT SESSION: Continue to work on posture, high level balance, gait  Solon Palm, PT  09/30/2022, 12:47 PM

## 2022-09-30 ENCOUNTER — Ambulatory Visit: Payer: Medicare Other | Admitting: Physical Therapy

## 2022-09-30 ENCOUNTER — Encounter: Payer: Self-pay | Admitting: Physical Therapy

## 2022-09-30 DIAGNOSIS — R2689 Other abnormalities of gait and mobility: Secondary | ICD-10-CM | POA: Diagnosis not present

## 2022-09-30 DIAGNOSIS — M6281 Muscle weakness (generalized): Secondary | ICD-10-CM

## 2022-09-30 DIAGNOSIS — M25652 Stiffness of left hip, not elsewhere classified: Secondary | ICD-10-CM

## 2022-09-30 DIAGNOSIS — R2681 Unsteadiness on feet: Secondary | ICD-10-CM | POA: Diagnosis not present

## 2022-09-30 DIAGNOSIS — R293 Abnormal posture: Secondary | ICD-10-CM

## 2022-10-09 DIAGNOSIS — M25641 Stiffness of right hand, not elsewhere classified: Secondary | ICD-10-CM | POA: Diagnosis not present

## 2022-10-09 DIAGNOSIS — G5602 Carpal tunnel syndrome, left upper limb: Secondary | ICD-10-CM | POA: Diagnosis not present

## 2022-10-15 NOTE — Therapy (Signed)
OUTPATIENT PHYSICAL THERAPY NEURO TREATMENT AND RECERTIFICATION***   Patient Name: MARIANNE OW MRN: [id number] DOB:[birth year], 77 y.o., male Today's Date: 10/15/2022   PCP: Everrett Coombe, DO REFERRING PROVIDER: Everrett Coombe, DO  END OF SESSION:      Past Medical History:  Diagnosis Date   Arm DVT (deep venous thromboembolism), acute, left (HCC) 03/27/2021   Benign localized prostatic hyperplasia with lower urinary tract symptoms (LUTS)    Complication of anesthesia    pt says he gets "restless" when he is waking up and nurses have had to "hold him down"- happened several surgeries ago    Diverticulosis of colon    History of colon polyps    History of kidney stones    History of MRSA infection 2007   post right shoulder surgery   History of urinary retention 05/31/2017   due to BPH   Incomplete emptying of bladder    Left arm swelling 08/21/2020   OA (osteoarthritis)    Peripheral neuropathy    feet-- hx frost bite   Pinched nerve in neck    right index and middle fingers numbness   Wears glasses    Wears hearing aid in both ears    Past Surgical History:  Procedure Laterality Date   CATARACT EXTRACTION Left 01/13/2020   COLONOSCOPY     CYST REMOVAL LEG Right 1975   CYSTOSCOPY WITH INSERTION OF UROLIFT N/A 07/20/2017   Procedure: CYSTOSCOPY WITH INSERTION OF UROLIFT;  Surgeon: Malen Gauze, MD;  Location: [facility];  Service: Urology;  Laterality: N/A;   CYSTOSCOPY WITH INSERTION OF UROLIFT     DISTAL CLAVICLE EXCISION Left 1977   left shoulder   ELBOW SURGERY Right 1969;  1980   HAND SURGERY Right 1968   INCISION AND DRAINAGE HIP Left 05/01/2020   Procedure: IRRIGATION AND DEBRIDEMENT LEFT HIP SEROMA;  Surgeon: Durene Romans, MD;  Location: [facility];  Service: Orthopedics;  Laterality: Left;  90 mins   LAMINECTOMY  1970   L4-5   LUMBAR FUSION  2013  approx.   L1--L5   ORIF HIP FRACTURE Left 01/17/2020   Procedure: OPEN REDUCTION INTERNAL  FIXATION LEFT HIP GREATER TROCHANTER FRACTURE;  Surgeon: Durene Romans, MD;  Location: [facility];  Service: Orthopedics;  Laterality: Left;  90 MINS   ORIF PERIPROSTHETIC FRACTURE Left 02/27/2020   Procedure: OPEN REDUCTION INTERNAL FIXATION (ORIF) LEFT PERI-PROSTETIC  HIP FRACTURE;  Surgeon: Durene Romans, MD;  Location: [facility];  Service: Orthopedics;  Laterality: Left;    ROTATOR CUFF REPAIR Right x5    last one 2007   5 surgeries in 9 days--- original repair then 4 sx'  I&D sx's for MRSA infection   SHOULDER ARTHROSCOPY WITH DISTAL CLAVICLE RESECTION Left 1977   SHOULDER SURGERY Left 1976   TOTAL HIP ARTHROPLASTY Left 11/23/2015   Procedure: LEFT TOTAL HIP ARTHROPLASTY ANTERIOR APPROACH;  Surgeon: Durene Romans, MD;  Location: [facility];  Service: Orthopedics;  Laterality: Left;   TOTAL HIP REVISION     10/14/16 Dr. 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/07/2022   Coronary artery disease involving native coronary artery without angina pectoris 08/07/2022   Agatston CAC score 200-399 08/07/2022   Carpal tunnel syndrome on both sides 08/07/2022   Screening for lipid disorders 06/24/2021   Fracture of greater trochanter (HCC) 01/17/2020   Obese 10/15/2016   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/14/2016   History of nephrolithiasis 07/24/2016   Neuropathy 07/24/2016   Calculus of kidney 07/24/2016   History of repair of hip joint 11/23/2015    ONSET DATE: Chronic  REFERRING DIAG:  R42 (ICD-10-CM) - Dizziness  M25.551,M25.552,G89.29 (ICD-10-CM) - Chronic pain of both hips  M25.511,G89.29,M25.512  (ICD-10-CM) - Chronic pain of both shoulders    THERAPY DIAG:  No diagnosis found.  Rationale for Evaluation and Treatment: Rehabilitation  SUBJECTIVE:                                                                                                                                                                                             SUBJECTIVE STATEMENT: *** Pt accompanied by: self  PERTINENT HISTORY: Last hip surgery 1.5 years ago; L hip surgery x 5, R shoulder surgery  PAIN:  Are you having pain? No  PRECAUTIONS: Fall  WEIGHT BEARING RESTRICTIONS: No  FALLS: Has patient fallen in last 6 months? Yes. Number of falls 1 slid out of bed  LIVING ENVIRONMENT: Lives with: lives with their spouse Lives in: House/apartment Stairs: No Has following equipment at home: None  PLOF: Independent  PATIENT GOALS: Get off using cane  OBJECTIVE:   DIAGNOSTIC FINDINGS: Nothing recent on file  COGNITION: Overall cognitive status: Within functional limits for tasks assessed   SENSATION: A little neuropathy on outside of both feet  MUSCLE LENGTH: Maisie Fus test: Right -10 deg; Left -10 deg Quad: R 90 deg, L 90 deg   09/30/22  Quad: B 95 deg L 100 deg passive, R 98 deg passive  POSTURE: rounded shoulders, increased thoracic kyphosis, flexed trunk , weight shift right, and L foot abducted, L iliac crest elevated compared to R, slight L knee bend  LOWER EXTREMITY ROM:     Active  Right Eval Left Eval  Hip flexion    Hip extension    Hip abduction    Hip adduction    Hip internal rotation    Hip external rotation    Knee flexion    Knee extension    Ankle dorsiflexion    Ankle plantarflexion    Ankle inversion    Ankle eversion     (Blank rows = not tested)  LOWER EXTREMITY MMT:    MMT Right Eval Left Eval  Hip flexion 4- 4-  Hip extension 3- 2+  Hip abduction 4+ 3-  Hip adduction    Hip internal rotation 5 3  Hip external rotation 4+ 3  Knee flexion 5 5   Knee extension 5 5  Ankle dorsiflexion    Ankle plantarflexion    Ankle inversion    Ankle eversion    (Blank rows = not tested)  BED MOBILITY:  Sit to supine SBA Supine to sit SBA Rolling to Right SBA Rolling to Left SBA   STAIRS: Level of Assistance: SBA Stair Negotiation Technique: Alternating Pattern  with Bilateral Rails Number of Stairs: 5  Height of Stairs: 4-6"   GAIT: Gait pattern:  L foot slap, L foot externally rotates during swing, wide BOS, leans on cane on R with R shoulder hiked/elevated, antalgic Distance walked: Within clinic Assistive device utilized: Single point cane Level of assistance: SBA  FUNCTIONAL TESTS:  5 times sit to stand: 18.11 sec Berg Balance Scale: 38/56 Dynamic Gait Index: 19/24   PATIENT SURVEYS:  TBA  TODAY'S TREATMENT:      [facility] Adult PT Treatment:                                                DATE: 10/16/22/24  RECERT Therapeutic Exercise: Nustep L5 x 5 min LEs only Prone on forearms x1 min Prone quad stretch with strap 2x 30 sec Prone hip ext x10 Prone hip abd 2x10 Sidelying clamshell 2x10 Neuromuscular re-ed: Alternating step tap 4" step 2x10 One foot on 4" step x30 sec One foot on 4" step diagonals red med ball 2x10 Gait: Staggered stance weight shift forward/backward x10   [facility] Adult PT Treatment:                                                DATE: 09/30/22 Therapeutic Exercise: Nustep L5 x 6 min LEs only    Therapeutic Activities BERG and DGI completed Gait: Gait x 400 ft no AD  [facility] Adult PT Treatment:                                                DATE: 09/11/22 Therapeutic Exercise: Nustep L5 x 5 min LEs only Prone on forearms x1 min Prone quad stretch with strap 2x 30 sec Prone hip ext x10 Prone hip abd 2x10 Sidelying clamshell 2x10 Neuromuscular re-ed: Alternating step tap 4" step 2x10 One foot on 4" step x30 sec One foot on 4" step diagonals red med ball 2x10 Gait: Staggered stance weight  shift forward/backward x10     [facility] Adult PT Treatment:                                                DATE: 09/09/22 Therapeutic Exercise: Nustep L5 x 5 min LEs only Seated hamstring stretch x 30 sec R&L Seated figure 4 stretch x 30 sec R&L Seated hip flexor stretch 2x 30 sec R&L Neuromuscular re-ed: Staggered stance  3x30 sec R&L Staggered stance forward/backward weight shift 2x10 R&L Feet together head rotations/head nods 2x30 sec Gait: Heel/toe swing phase 2x10 R&L    [facility] Adult PT Treatment:                                                DATE: 09/03/22 Therapeutic Exercise: Seated hamstring stretch x 30 sec R&L Seated figure 4 stretch x 30 sec R&L Seated hip flexor stretch 2x 30 sec R&L Standing glute set 2x10 Standing heel raise 3x10 Standing hip abduction toe touch 2x10 Standing hip ext toe touch 2x10 Neuromuscular re-ed: Staggered stance 2x30 sec Feet apart L<>R weight shifting x30 Feet together eyes open head rotation 2x30 sec Feet together eyes open head nods 2x30 sec Gait: Amb x 2 laps with SPC, cues for heel/toe                                                                                                                          DATE: 08/21/22 See HEP below    PATIENT EDUCATION: Education details: Exam findings, POC, initial HEP Person educated: Patient Education method: Explanation, Demonstration, and Handouts Education comprehension: verbalized understanding, returned demonstration, and needs further education  HOME EXERCISE PROGRAM: Access Code: [email or web address] URL: [email or web address] Date: 08/21/2022 Prepared by: Vernon Prey April Kirstie Peri  Exercises - Wide Tandem Stance with Eyes Open  - 1 x daily - 7 x weekly - 2 sets - 30 sec hold - Prone Press Up On Elbows  - 1 x daily - 7 x weekly - 2 sets - 30 sec hold - Prone Quadriceps Stretch with Strap  - 1 x daily - 7 x weekly - 2 sets - 30 sec hold  GOALS: Goals reviewed with patient?  Yes  SHORT TERM GOALS: Target date: 09/18/2022 extended to    Pt will be ind with initial HEP Baseline: Goal status: MET  2.  Pt will demo increased quad/hip flexor flexibility with at least 100 deg of knee bend during [place] test Baseline:  Goal status: IN PROGRESS 09/30/22  3.  Pt will be independent with maintaining increased trunk/hip extension in standing Baseline:  Goal status: IN PROGRESS   LONG TERM GOALS: Target date: 10/16/2022   Pt will be ind with management and progression of HEP Baseline:  Goal status: IN PROGRESS  2.  Pt will have increased Berg Balance score of >/=45/56 to demo decreased fall risk Baseline:  Goal status: MET  3.  Pt will demo improved DGI to >/=20/24 to demo decreased fall risk Baseline:  Goal status: MET  4.  Pt will be able to amb ind x400' per pt's personal goals Baseline:  Goal status: MET   ASSESSMENT:  CLINICAL IMPRESSION: ***   OBJECTIVE IMPAIRMENTS: Abnormal gait, decreased activity tolerance, decreased balance, decreased endurance, decreased mobility, difficulty walking, decreased ROM, decreased strength,  increased fascial restrictions, increased muscle spasms, impaired flexibility, improper body mechanics, and postural dysfunction.   PLAN:  PT FREQUENCY: 2x/week  PT DURATION: 8 weeks  PLANNED INTERVENTIONS: Therapeutic exercises, Therapeutic activity, Neuromuscular re-education, Balance training, Gait training, Patient/Family education, Self Care, Joint mobilization, Aquatic Therapy, Dry Needling, Electrical stimulation, Spinal mobilization, Cryotherapy, Moist heat, Taping, Vasopneumatic device, Ionotophoresis 4mg /ml Dexamethasone, Manual therapy, and Re-evaluation  PLAN FOR NEXT SESSION: Continue to work on posture, high level balance, gait  Solon Palm, PT  10/15/2022, 10:29 PM

## 2022-10-16 ENCOUNTER — Ambulatory Visit: Payer: Medicare Other | Attending: Family Medicine | Admitting: Physical Therapy

## 2022-10-16 ENCOUNTER — Encounter: Payer: Self-pay | Admitting: Physical Therapy

## 2022-10-16 DIAGNOSIS — M6281 Muscle weakness (generalized): Secondary | ICD-10-CM | POA: Diagnosis not present

## 2022-10-16 DIAGNOSIS — R2689 Other abnormalities of gait and mobility: Secondary | ICD-10-CM | POA: Insufficient documentation

## 2022-10-16 DIAGNOSIS — R293 Abnormal posture: Secondary | ICD-10-CM | POA: Diagnosis not present

## 2022-10-16 DIAGNOSIS — R2681 Unsteadiness on feet: Secondary | ICD-10-CM | POA: Insufficient documentation

## 2022-10-16 DIAGNOSIS — M25652 Stiffness of left hip, not elsewhere classified: Secondary | ICD-10-CM | POA: Diagnosis not present

## 2022-10-22 ENCOUNTER — Ambulatory Visit: Payer: Medicare Other

## 2022-10-22 DIAGNOSIS — R2681 Unsteadiness on feet: Secondary | ICD-10-CM | POA: Diagnosis not present

## 2022-10-22 DIAGNOSIS — R293 Abnormal posture: Secondary | ICD-10-CM | POA: Diagnosis not present

## 2022-10-22 DIAGNOSIS — R2689 Other abnormalities of gait and mobility: Secondary | ICD-10-CM

## 2022-10-22 DIAGNOSIS — M25652 Stiffness of left hip, not elsewhere classified: Secondary | ICD-10-CM | POA: Diagnosis not present

## 2022-10-22 DIAGNOSIS — M6281 Muscle weakness (generalized): Secondary | ICD-10-CM

## 2022-10-22 NOTE — Therapy (Signed)
OUTPATIENT PHYSICAL THERAPY NEURO TREATMENT    Patient Name: Jacob Mitchell MRN: [id number] DOB:02/08/1946, 77 y.o., male Today's Date: 10/22/2022   PCP: Everrett Coombe, DO REFERRING PROVIDER: Everrett Coombe, DO  END OF SESSION:  PT End of Session - 10/22/22 1319     Visit Number 7    Number of Visits 16    Date for PT Re-Evaluation 11/28/22    Authorization Type Medicare    Progress Note Due on Visit 10    PT Start Time 1318    PT Stop Time 1357    PT Time Calculation (min) 39 min    Equipment Utilized During Treatment Gait belt    Activity Tolerance Patient tolerated treatment well    Behavior During Therapy WFL for tasks assessed/performed                 Past Medical History:  Diagnosis Date   Arm DVT (deep venous thromboembolism), acute, left (HCC) 03/27/2021   Benign localized prostatic hyperplasia with lower urinary tract symptoms (LUTS)    Complication of anesthesia    pt says he gets "restless" when he is waking up and nurses have had to "hold him down"- happened several surgeries ago    Diverticulosis of colon    History of colon polyps    History of kidney stones    History of MRSA infection 2007   post right shoulder surgery   History of urinary retention 05/31/2017   due to BPH   Incomplete emptying of bladder    Left arm swelling 08/21/2020   OA (osteoarthritis)    Peripheral neuropathy    feet-- hx frost bite   Pinched nerve in neck    right index and middle fingers numbness   Wears glasses    Wears hearing aid in both ears    Past Surgical History:  Procedure Laterality Date   CATARACT EXTRACTION Left 01/13/2020   COLONOSCOPY     CYST REMOVAL LEG Right 1975   CYSTOSCOPY WITH INSERTION OF UROLIFT N/A 07/20/2017   Procedure: CYSTOSCOPY WITH INSERTION OF UROLIFT;  Surgeon: Malen Gauze, MD;  Location: [facility];  Service: Urology;  Laterality: N/A;   CYSTOSCOPY WITH INSERTION OF UROLIFT     DISTAL CLAVICLE EXCISION  Left 1977   left shoulder   ELBOW SURGERY Right 1969;  1980   HAND SURGERY Right 1968   INCISION AND DRAINAGE HIP Left 05/01/2020   Procedure: IRRIGATION AND DEBRIDEMENT LEFT HIP SEROMA;  Surgeon: Durene Romans, MD;  Location: [facility];  Service: Orthopedics;  Laterality: Left;  90 mins   LAMINECTOMY  1970   L4-5   LUMBAR FUSION  2013  approx.   L1--L5   ORIF HIP FRACTURE Left 01/17/2020   Procedure: OPEN REDUCTION INTERNAL FIXATION LEFT HIP GREATER TROCHANTER FRACTURE;  Surgeon: Durene Romans, MD;  Location: [facility];  Service: Orthopedics;  Laterality: Left;  90 MINS   ORIF PERIPROSTHETIC FRACTURE Left 02/27/2020   Procedure: OPEN REDUCTION INTERNAL FIXATION (ORIF) LEFT PERI-PROSTETIC  HIP FRACTURE;  Surgeon: Durene Romans, MD;  Location: [facility];  Service: Orthopedics;  Laterality: Left;    ROTATOR CUFF REPAIR Right x5    last one 2007   5 surgeries in 9 days--- original repair then 4 sx'  I&D sx's for MRSA infection   SHOULDER ARTHROSCOPY WITH DISTAL CLAVICLE RESECTION Left 1977   SHOULDER SURGERY Left 1976   TOTAL HIP ARTHROPLASTY Left 11/23/2015   Procedure: LEFT TOTAL HIP ARTHROPLASTY ANTERIOR  APPROACH;  Surgeon: Durene Romans, MD;  Location: [facility];  Service: Orthopedics;  Laterality: Left;   TOTAL HIP REVISION     10/14/16 Dr. 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   Coronary artery disease involving native coronary artery without angina pectoris 08/07/2022   Agatston CAC score 200-399 08/07/2022   Carpal tunnel syndrome on both sides 08/07/2022   Screening for lipid  disorders 06/24/2021   Fracture of greater trochanter (HCC) 01/17/2020   Obese 10/15/2016   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/14/2016   History of nephrolithiasis 07/24/2016   Neuropathy 07/24/2016   Calculus of kidney 07/24/2016   History of repair of hip joint 11/23/2015    ONSET DATE: Chronic  REFERRING DIAG:  R42 (ICD-10-CM) - Dizziness  M25.551,M25.552,G89.29 (ICD-10-CM) - Chronic pain of both hips  M25.511,G89.29,M25.512 (ICD-10-CM) - Chronic pain of both shoulders    THERAPY DIAG:  Abnormal posture  Muscle weakness (generalized)  Stiffness of left hip, not elsewhere classified  Other abnormalities of gait and mobility  Unsteadiness on feet  Rationale for Evaluation and Treatment: Rehabilitation  SUBJECTIVE:                                                                                                                                                                                             SUBJECTIVE STATEMENT: "I think this is working. I am trying to do more without the cane." Pt accompanied by: self  PERTINENT HISTORY: Last hip surgery 1.5 years ago; L hip surgery x 5, R shoulder surgery  PAIN:  Are you having pain? No  PRECAUTIONS: Fall  WEIGHT BEARING RESTRICTIONS: No  FALLS: Has patient fallen in last 6 months? Yes. Number of falls 1 slid out of bed  LIVING ENVIRONMENT: Lives with: lives with their spouse Lives in: House/apartment Stairs: No Has following equipment at home: None  PLOF: Independent  PATIENT GOALS: Get off using cane  OBJECTIVE:   DIAGNOSTIC FINDINGS: Nothing recent on file  COGNITION: Overall cognitive status: Within functional limits for tasks assessed   SENSATION: A little neuropathy on outside of both feet  MUSCLE LENGTH: Maisie Fus test: Right -10 deg; Left -10 deg Quad: R 90 deg, L 90  deg   09/30/22  Quad: B 95 deg L 100 deg passive, R 98 deg passive  POSTURE: rounded shoulders, increased thoracic kyphosis, flexed trunk  , weight shift right, and L foot abducted, L iliac crest elevated compared to R, slight L knee bend  LOWER EXTREMITY ROM:     Active  Right Eval Left Eval  Hip flexion    Hip extension    Hip abduction    Hip adduction    Hip internal rotation    Hip external rotation    Knee flexion    Knee extension    Ankle dorsiflexion    Ankle plantarflexion    Ankle inversion    Ankle eversion     (Blank rows = not tested)  LOWER EXTREMITY MMT:    MMT Right Eval Left Eval  Hip flexion 4- 4-  Hip extension 3- 2+  Hip abduction 4+ 3-  Hip adduction    Hip internal rotation 5 3  Hip external rotation 4+ 3  Knee flexion 5 5  Knee extension 5 5  Ankle dorsiflexion    Ankle plantarflexion    Ankle inversion    Ankle eversion    (Blank rows = not tested)  BED MOBILITY:  Sit to supine SBA Supine to sit SBA Rolling to Right SBA Rolling to Left SBA   STAIRS: Level of Assistance: SBA Stair Negotiation Technique: Alternating Pattern  with Bilateral Rails Number of Stairs: 5  Height of Stairs: 4-6"   GAIT: Gait pattern:  L foot slap, L foot externally rotates during swing, wide BOS, leans on cane on R with R shoulder hiked/elevated, antalgic Distance walked: Within clinic Assistive device utilized: Single point cane Level of assistance: SBA  FUNCTIONAL TESTS:  5 times sit to stand: 18.11 sec Berg Balance Scale: 38/56  09/30/22 46/56 Dynamic Gait Index: 19/24  09/30/22 20/24   PATIENT SURVEYS:  TBA  TODAY'S TREATMENT:     [facility] Adult PT Treatment:                                                DATE: 10/22/22 Therapeutic Exercise: Standing march 2 x 10  Step ups aerobic stepper LLE leading 2 x 6 CGA Step taps aerobic stepper and 1 insert 2 x 10 each CGA Side step at counter 3 sets d/b Toe raises 2 x 10  Backward walking 3 sets d/b at counter CGA Sit to stand with overhead ball reach 3 x 5    [facility] Adult PT Treatment:                                                 DATE: 10/16/22/24  RECERT Therapeutic Exercise: Nustep L6 x 6 min LEs only  Neuromuscular re-ed: Standing hip rotation to floor dots  Standing reaches to tall poles B to increase trunk rotation  Standing step ups to mid stairs B Standing holding hip flexion 3 sec  Turning in circles B no UE support Alternating step tap 4" step 2x10 One foot on 6" step x30 sec B One foot on 6" step diagonals red med ball 2x10    [facility] Adult PT Treatment:  DATE: 09/30/22 Therapeutic Exercise: Nustep L5 x 6 min LEs only    Therapeutic Activities BERG and DGI completed Gait: Gait x 400 ft no AD  [facility] Adult PT Treatment:                                                DATE: 09/11/22 Therapeutic Exercise: Nustep L5 x 5 min LEs only Prone on forearms x1 min Prone quad stretch with strap 2x 30 sec Prone hip ext x10 Prone hip abd 2x10 Sidelying clamshell 2x10 Neuromuscular re-ed: Alternating step tap 4" step 2x10 One foot on 4" step x30 sec One foot on 4" step diagonals red med ball 2x10 Gait: Staggered stance weight shift forward/backward x10       PATIENT EDUCATION: Education details: HEP review Person educated: Patient Education method: Explanation Education comprehension: verbalized understanding  HOME EXERCISE PROGRAM: Access Code: [email or web address] URL: [email or web address] Date: 10/16/2022 Prepared by: Raynelle Fanning  Exercises  - Standing Balance Activity: Stepping Out in Transverse Plane for Hip Rotation  - 1 x daily - 3-4 x weekly - 2 sets - 10 reps - Standing Reach to Opposite Side with Weight Shift  - 1 x daily - 3-4 x weekly - 2 sets - 10 reps  GOALS: Goals reviewed with patient? Yes  SHORT TERM GOALS: Target date: 09/18/2022    Pt will be ind with initial HEP Baseline: Goal status: MET  2.  Pt will demo increased quad/hip flexor flexibility with at least 100 deg of knee bend during [place] test Baseline:  Goal status: IN  PROGRESS 09/30/22  3.  Pt will be independent with maintaining increased trunk/hip extension in standing Baseline:  Goal status: IN PROGRESS   LONG TERM GOALS: Target date: 10/16/2022 extended to 11/28/22   Pt will be ind with management and progression of HEP Baseline:  Goal status: IN PROGRESS  2.  Pt will have increased Berg Balance score of >/=52/56 to demo decreased fall risk Baseline:  Goal status: REVISED  3.  Pt will demo improved DGI to >/=22/24 to demo decreased fall risk Baseline:  Goal status: REVISED  4.  Pt will be able to amb ind x400' per pt's personal goals Baseline:  Goal status: MET   ASSESSMENT:  CLINICAL IMPRESSION: Continued emphasis on standing strengthening and balance activity. He has difficulty with step ups on the LLE having tendency to compensate with circumduction swing. Occasional LOB with step ups and step taps requiring therapist assist to regain balance. He requires frequent cues for pacing with activity as he has tendency to quickly move through exercises, making him more unstable. He has initial difficulty controlling descent with sit to stand, but with continued reps is able to complete with proper control. CGA-SBA required for all standing activity today. No changes made to HEP this visit.   OBJECTIVE IMPAIRMENTS: Abnormal gait, decreased activity tolerance, decreased balance, decreased endurance, decreased mobility, difficulty walking, decreased ROM, decreased strength, increased fascial restrictions, increased muscle spasms, impaired flexibility, improper body mechanics, and postural dysfunction.   PLAN:  PT FREQUENCY: 2x/week  PT DURATION: 8 weeks  PLANNED INTERVENTIONS: Therapeutic exercises, Therapeutic activity, Neuromuscular re-education, Balance training, Gait training, Patient/Family education, Self Care, Joint mobilization, Aquatic Therapy, Dry Needling, Electrical stimulation, Spinal mobilization, Cryotherapy, Moist heat, Taping,  Vasopneumatic device, Ionotophoresis 4mg /ml Dexamethasone, Manual therapy, and Re-evaluation  PLAN FOR NEXT SESSION: Continue to work  on high level balance, gait, trunk rotation, SLS activities to strengthen L gluteals  Letitia Libra, PT, DPT, ATC 10/22/22 2:00 PM

## 2022-10-23 ENCOUNTER — Ambulatory Visit: Payer: Medicare Other

## 2022-10-23 DIAGNOSIS — M25641 Stiffness of right hand, not elsewhere classified: Secondary | ICD-10-CM | POA: Diagnosis not present

## 2022-10-30 ENCOUNTER — Ambulatory Visit: Payer: Medicare Other

## 2022-10-30 DIAGNOSIS — M25652 Stiffness of left hip, not elsewhere classified: Secondary | ICD-10-CM | POA: Diagnosis not present

## 2022-10-30 DIAGNOSIS — R293 Abnormal posture: Secondary | ICD-10-CM | POA: Diagnosis not present

## 2022-10-30 DIAGNOSIS — R2681 Unsteadiness on feet: Secondary | ICD-10-CM | POA: Diagnosis not present

## 2022-10-30 DIAGNOSIS — M6281 Muscle weakness (generalized): Secondary | ICD-10-CM

## 2022-10-30 DIAGNOSIS — R2689 Other abnormalities of gait and mobility: Secondary | ICD-10-CM | POA: Diagnosis not present

## 2022-10-30 NOTE — Therapy (Signed)
OUTPATIENT PHYSICAL THERAPY NEURO TREATMENT    Patient Name: Jacob Mitchell MRN: [id number] DOB:Apr 24, 1945, 77 y.o., male Today's Date: 10/30/2022   PCP: Everrett Coombe, DO REFERRING PROVIDER: Everrett Coombe, DO  END OF SESSION:  PT End of Session - 10/30/22 1103     Visit Number 8    Number of Visits 16    Date for PT Re-Evaluation 11/28/22    Authorization Type Medicare    Progress Note Due on Visit 10    PT Start Time 1103    PT Stop Time 1145    PT Time Calculation (min) 42 min    Equipment Utilized During Treatment Gait belt    Activity Tolerance Patient tolerated treatment well    Behavior During Therapy WFL for tasks assessed/performed                  Past Medical History:  Diagnosis Date   Arm DVT (deep venous thromboembolism), acute, left (HCC) 03/27/2021   Benign localized prostatic hyperplasia with lower urinary tract symptoms (LUTS)    Complication of anesthesia    pt says he gets "restless" when he is waking up and nurses have had to "hold him down"- happened several surgeries ago    Diverticulosis of colon    History of colon polyps    History of kidney stones    History of MRSA infection 2007   post right shoulder surgery   History of urinary retention 05/31/2017   due to BPH   Incomplete emptying of bladder    Left arm swelling 08/21/2020   OA (osteoarthritis)    Peripheral neuropathy    feet-- hx frost bite   Pinched nerve in neck    right index and middle fingers numbness   Wears glasses    Wears hearing aid in both ears    Past Surgical History:  Procedure Laterality Date   CATARACT EXTRACTION Left 01/13/2020   COLONOSCOPY     CYST REMOVAL LEG Right 1975   CYSTOSCOPY WITH INSERTION OF UROLIFT N/A 07/20/2017   Procedure: CYSTOSCOPY WITH INSERTION OF UROLIFT;  Surgeon: Malen Gauze, MD;  Location[facility];  Service: Urology;  Laterality: N/A;   CYSTOSCOPY WITH INSERTION OF UROLIFT     DISTAL CLAVICLE EXCISION  Left 1977   left shoulder   ELBOW SURGERY Right 1969;  1980   HAND SURGERY Right 1968   INCISION AND DRAINAGE HIP Left 05/01/2020   Procedure: IRRIGATION AND DEBRIDEMENT LEFT HIP SEROMA;  Surgeon: Durene Romans, MD;  Location: [facility];  Service: Orthopedics;  Laterality: Left;  90 mins   LAMINECTOMY  1970   L4-5   LUMBAR FUSION  2013  approx.   L1--L5   ORIF HIP FRACTURE Left 01/17/2020   Procedure: OPEN REDUCTION INTERNAL FIXATION LEFT HIP GREATER TROCHANTER FRACTURE;  Surgeon: Durene Romans, MD;  Location: [facility];  Service: Orthopedics;  Laterality: Left;  90 MINS   ORIF PERIPROSTHETIC FRACTURE Left 02/27/2020   Procedure: OPEN REDUCTION INTERNAL FIXATION (ORIF) LEFT PERI-PROSTETIC  HIP FRACTURE;  Surgeon: Durene Romans, MD;  Location: [facility];  Service: Orthopedics;  Laterality: Left;    ROTATOR CUFF REPAIR Right x5    last one 2007   5 surgeries in 9 days--- original repair then 4 sx'  I&D sx's for MRSA infection   SHOULDER ARTHROSCOPY WITH DISTAL CLAVICLE RESECTION Left 1977   SHOULDER SURGERY Left 1976   TOTAL HIP ARTHROPLASTY Left 11/23/2015   Procedure: LEFT TOTAL HIP ARTHROPLASTY  ANTERIOR APPROACH;  Surgeon: Durene Romans, MD;  Location: [facility];  Service: Orthopedics;  Laterality: Left;   TOTAL HIP REVISION     10/14/16 Dr. 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   Coronary artery disease involving native coronary artery without angina pectoris 08/07/2022   Agatston CAC score 200-399 08/07/2022   Carpal tunnel syndrome on both sides 08/07/2022   Screening for lipid  disorders 06/24/2021   Fracture of greater trochanter (HCC) 01/17/2020   Obese 10/15/2016   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/14/2016   History of nephrolithiasis 07/24/2016   Neuropathy 07/24/2016   Calculus of kidney 07/24/2016   History of repair of hip joint 11/23/2015    ONSET DATE: Chronic  REFERRING DIAG:  R42 (ICD-10-CM) - Dizziness  M25.551,M25.552,G89.29 (ICD-10-CM) - Chronic pain of both hips  M25.511,G89.29,M25.512 (ICD-10-CM) - Chronic pain of both shoulders    THERAPY DIAG:  Abnormal posture  Muscle weakness (generalized)  Stiffness of left hip, not elsewhere classified  Other abnormalities of gait and mobility  Unsteadiness on feet  Rationale for Evaluation and Treatment: Rehabilitation  SUBJECTIVE:                                                                                                                                                                                             SUBJECTIVE STATEMENT: "I'm going without my cane some. No pain."  Pt accompanied by: self  PERTINENT HISTORY: Last hip surgery 1.5 years ago; L hip surgery x 5, R shoulder surgery  PAIN:  Are you having pain? No  PRECAUTIONS: Fall  WEIGHT BEARING RESTRICTIONS: No  FALLS: Has patient fallen in last 6 months? Yes. Number of falls 1 slid out of bed  LIVING ENVIRONMENT: Lives with: lives with their spouse Lives in: House/apartment Stairs: No Has following equipment at home: None  PLOF: Independent  PATIENT GOALS: Get off using cane  OBJECTIVE:   DIAGNOSTIC FINDINGS: Nothing recent on file  COGNITION: Overall cognitive status: Within functional limits for tasks assessed   SENSATION: A little neuropathy on outside of both feet  MUSCLE LENGTH: Maisie Fus test: Right -10 deg; Left -10 deg Quad: R 90 deg, L 90 deg   09/30/22  Quad: B 95 deg L 100 deg passive, R 98 deg passive  10/30/22: Ely: 100 degrees bilateral   POSTURE: rounded shoulders, increased thoracic  kyphosis, flexed trunk , weight shift right, and L foot abducted, L iliac crest elevated compared to R, slight L knee bend  LOWER EXTREMITY ROM:     Active  Right Eval Left Eval  Hip flexion    Hip extension    Hip abduction    Hip adduction    Hip internal rotation    Hip external rotation    Knee flexion    Knee extension    Ankle dorsiflexion    Ankle plantarflexion    Ankle inversion    Ankle eversion     (Blank rows = not tested)  LOWER EXTREMITY MMT:    MMT Right Eval Left Eval  Hip flexion 4- 4-  Hip extension 3- 2+  Hip abduction 4+ 3-  Hip adduction    Hip internal rotation 5 3  Hip external rotation 4+ 3  Knee flexion 5 5  Knee extension 5 5  Ankle dorsiflexion    Ankle plantarflexion    Ankle inversion    Ankle eversion    (Blank rows = not tested)  BED MOBILITY:  Sit to supine SBA Supine to sit SBA Rolling to Right SBA Rolling to Left SBA   STAIRS: Level of Assistance: SBA Stair Negotiation Technique: Alternating Pattern  with Bilateral Rails Number of Stairs: 5  Height of Stairs: 4-6"   GAIT: Gait pattern:  L foot slap, L foot externally rotates during swing, wide BOS, leans on cane on R with R shoulder hiked/elevated, antalgic Distance walked: Within clinic Assistive device utilized: Single point cane Level of assistance: SBA  FUNCTIONAL TESTS:  5 times sit to stand: 18.11 sec Berg Balance Scale: 38/56  09/30/22 46/56 Dynamic Gait Index: 19/24  09/30/22 20/24   PATIENT SURVEYS:  TBA  TODAY'S TREATMENT:     [facility] Adult PT Treatment:                                                DATE: 10/30/22 Therapeutic Exercise: Ladder stepping fwd and lateral 2 sets each  SLR 2 x 10 each  Updated HEP   Neuromuscular re-ed: SBA to CGA Romberg x 30 sec  Normal stance eyes closed x 30 sec  Narrow stance eyes closed x 30 sec  Romberg eyes closed 2 x 30 sec  Normal stance on foam x 30 sec  Romberg on foam 2 x 30 sec  SL cone tap x 10 each;  Single UE support    [facility] Adult PT Treatment:                                                DATE: 10/22/22 Therapeutic Exercise: Standing march 2 x 10  Step ups aerobic stepper LLE leading 2 x 6 CGA Step taps aerobic stepper and 1 insert 2 x 10 each CGA Side step at counter 3 sets d/b Toe raises 2 x 10  Backward walking 3 sets d/b at counter CGA Sit to stand with overhead ball reach 3 x 5    [facility] Adult PT Treatment:  DATE: 10/16/22/24  RECERT Therapeutic Exercise: Nustep L6 x 6 min LEs only  Neuromuscular re-ed: Standing hip rotation to floor dots  Standing reaches to tall poles B to increase trunk rotation  Standing step ups to mid stairs B Standing holding hip flexion 3 sec  Turning in circles B no UE support Alternating step tap 4" step 2x10 One foot on 6" step x30 sec B One foot on 6" step diagonals red med ball 2x10    [facility] Adult PT Treatment:                                                DATE: 09/30/22 Therapeutic Exercise: Nustep L5 x 6 min LEs only    Therapeutic Activities BERG and DGI completed Gait: Gait x 400 ft no AD  [facility] Adult PT Treatment:                                                DATE: 09/11/22 Therapeutic Exercise: Nustep L5 x 5 min LEs only Prone on forearms x1 min Prone quad stretch with strap 2x 30 sec Prone hip ext x10 Prone hip abd 2x10 Sidelying clamshell 2x10 Neuromuscular re-ed: Alternating step tap 4" step 2x10 One foot on 4" step x30 sec One foot on 4" step diagonals red med ball 2x10 Gait: Staggered stance weight shift forward/backward x10       PATIENT EDUCATION: Education details: HEP update Person educated: Patient Education method: Explanation, demo, cues, handout Education comprehension: verbalized understanding, returned demo, cues   HOME EXERCISE PROGRAM: Access Code: [email or web address] URL: [email or web address] Date: 10/30/2022 Prepared by: Letitia Libra  Exercises - Prone Quadriceps Stretch with Strap  - 1 x daily - 7 x weekly - 2 sets - 30 sec hold - Seated Hamstring Stretch  - 1 x daily - 7 x weekly - 2 sets - 30 sec hold - Seated Hip Flexor Stretch  - 1 x daily - 7 x weekly - 2 sets - 30 sec hold - Standing Hip Extension with Counter Support  - 1 x daily - 7 x weekly - 2 sets - 10 reps - Standing Hip Abduction  - 1 x daily - 7 x weekly - 2 sets - 10 reps - Heel Raises with Counter Support  - 1 x daily - 7 x weekly - 2 sets - 10 reps - Side Stepping with Counter Support  - 1 x daily - 7 x weekly - 2 sets - 10 reps - Staggered Stance Forward Backward Weight Shift with Unilateral Counter Support  - 1 x daily - 7 x weekly - 2 sets - 10 reps - Tap foot forward  - 1 x daily - 7 x weekly - 2 sets - 10 reps - Standing Balance Activity: Stepping Out in Transverse Plane for Hip Rotation  - 1 x daily - 3-4 x weekly - 2 sets - 10 reps - Standing Reach to Opposite Side with Weight Shift  - 1 x daily - 3-4 x weekly - 2 sets - 10 reps - Romberg Stance with Head Rotation  - 1 x daily - 7 x weekly - 2 sets - 30 sec hold - Romberg Stance with Head Nods  - 1 x  daily - 7 x weekly - 2 sets - 30 sec hold - Standing Balance with Eyes Closed  - 1 x daily - 7 x weekly - 3 sets - 30 sec  hold  GOALS: Goals reviewed with patient? Yes  SHORT TERM GOALS: Target date: 09/18/2022    Pt will be ind with initial HEP Baseline: Goal status: MET  2.  Pt will demo increased quad/hip flexor flexibility with at least 100 deg of knee bend during Ely test Baseline:  Goal status: MET  3.  Pt will be independent with maintaining increased trunk/hip extension in standing Baseline:  10/30/22: cues required for erect posture throughout session.  Goal status: IN PROGRESS   LONG TERM GOALS: Target date: 10/16/2022 extended to 11/28/22   Pt will be ind with management and progression of HEP Baseline:  Goal status: IN PROGRESS  2.  Pt will have increased Berg Balance  score of >/=52/56 to demo decreased fall risk Baseline:  Goal status: REVISED  3.  Pt will demo improved DGI to >/=22/24 to demo decreased fall risk Baseline:  Goal status: REVISED  4.  Pt will be able to amb ind x400' per pt's personal goals Baseline:  Goal status: MET   ASSESSMENT:  CLINICAL IMPRESSION: Continued with progression of balance and standing activity with good tolerance. Introduced balance activity on unstable surface with patient requiring consistent cues to maintain upright posture as he has tendency to maintain forward flexed posture. He demonstrates good postural stability with narrow stance eyes opened but has difficulty maintaining balance with eyes closed requiring frequent use of reaching strategy and occasional CGA. Quad flexibility has improved, having met this STG.   OBJECTIVE IMPAIRMENTS: Abnormal gait, decreased activity tolerance, decreased balance, decreased endurance, decreased mobility, difficulty walking, decreased ROM, decreased strength, increased fascial restrictions, increased muscle spasms, impaired flexibility, improper body mechanics, and postural dysfunction.   PLAN:  PT FREQUENCY: 2x/week  PT DURATION: 8 weeks  PLANNED INTERVENTIONS: Therapeutic exercises, Therapeutic activity, Neuromuscular re-education, Balance training, Gait training, Patient/Family education, Self Care, Joint mobilization, Aquatic Therapy, Dry Needling, Electrical stimulation, Spinal mobilization, Cryotherapy, Moist heat, Taping, Vasopneumatic device, Ionotophoresis 4mg /ml Dexamethasone, Manual therapy, and Re-evaluation  PLAN FOR NEXT SESSION: Continue to work on high level balance, gait, trunk rotation, SLS activities to strengthen L gluteals  Letitia Libra, PT, DPT, ATC 10/30/22 12:38 PM

## 2022-11-13 ENCOUNTER — Ambulatory Visit: Payer: Medicare Other | Attending: Family Medicine

## 2022-11-13 DIAGNOSIS — M25652 Stiffness of left hip, not elsewhere classified: Secondary | ICD-10-CM | POA: Diagnosis not present

## 2022-11-13 DIAGNOSIS — R293 Abnormal posture: Secondary | ICD-10-CM | POA: Insufficient documentation

## 2022-11-13 DIAGNOSIS — M6281 Muscle weakness (generalized): Secondary | ICD-10-CM | POA: Insufficient documentation

## 2022-11-13 DIAGNOSIS — R2681 Unsteadiness on feet: Secondary | ICD-10-CM | POA: Diagnosis not present

## 2022-11-13 DIAGNOSIS — R2689 Other abnormalities of gait and mobility: Secondary | ICD-10-CM | POA: Insufficient documentation

## 2022-11-13 NOTE — Therapy (Signed)
OUTPATIENT PHYSICAL THERAPY NEURO TREATMENT    Patient Name: Jacob Mitchell MRN: [id number] DOB:06-04-1945, 77 y.o., male Today's Date: 11/13/2022   PCP: Everrett Coombe, DO REFERRING PROVIDER: Everrett Coombe, DO  END OF SESSION:  PT End of Session - 11/13/22 1345     Visit Number 9    Number of Visits 16    Date for PT Re-Evaluation 11/28/22    Authorization Type Medicare    Progress Note Due on Visit 10    PT Start Time 1345    PT Stop Time 1430    PT Time Calculation (min) 45 min    Equipment Utilized During Treatment Gait belt    Activity Tolerance Patient tolerated treatment well    Behavior During Therapy WFL for tasks assessed/performed                   Past Medical History:  Diagnosis Date   Arm DVT (deep venous thromboembolism), acute, left (HCC) 03/27/2021   Benign localized prostatic hyperplasia with lower urinary tract symptoms (LUTS)    Complication of anesthesia    pt says he gets "restless" when he is waking up and nurses have had to "hold him down"- happened several surgeries ago    Diverticulosis of colon    History of colon polyps    History of kidney stones    History of MRSA infection 2007   post right shoulder surgery   History of urinary retention 05/31/2017   due to BPH   Incomplete emptying of bladder    Left arm swelling 08/21/2020   OA (osteoarthritis)    Peripheral neuropathy    feet-- hx frost bite   Pinched nerve in neck    right index and middle fingers numbness   Wears glasses    Wears hearing aid in both ears    Past Surgical History:  Procedure Laterality Date   CATARACT EXTRACTION Left 01/13/2020   COLONOSCOPY     CYST REMOVAL LEG Right 1975   CYSTOSCOPY WITH INSERTION OF UROLIFT N/A 07/20/2017   Procedure: CYSTOSCOPY WITH INSERTION OF UROLIFT;  Surgeon: Malen Gauze, MD;  Location: [facility];  Service: Urology;  Laterality: N/A;   CYSTOSCOPY WITH INSERTION OF UROLIFT     DISTAL CLAVICLE EXCISION  Left 1977   left shoulder   ELBOW SURGERY Right 1969;  1980   HAND SURGERY Right 1968   INCISION AND DRAINAGE HIP Left 05/01/2020   Procedure: IRRIGATION AND DEBRIDEMENT LEFT HIP SEROMA;  Surgeon: Durene Romans, MD;  Location: [facility];  Service: Orthopedics;  Laterality: Left;  90 mins   LAMINECTOMY  1970   L4-5   LUMBAR FUSION  2013  approx.   L1--L5   ORIF HIP FRACTURE Left 01/17/2020   Procedure: OPEN REDUCTION INTERNAL FIXATION LEFT HIP GREATER TROCHANTER FRACTURE;  Surgeon: Durene Romans, MD;  Location: [facility];  Service: Orthopedics;  Laterality: Left;  90 MINS   ORIF PERIPROSTHETIC FRACTURE Left 02/27/2020   Procedure: OPEN REDUCTION INTERNAL FIXATION (ORIF) LEFT PERI-PROSTETIC  HIP FRACTURE;  Surgeon: Durene Romans, MD;  Location: [facility];  Service: Orthopedics;  Laterality: Left;    ROTATOR CUFF REPAIR Right x5    last one 2007   5 surgeries in 9 days--- original repair then 4 sx'  I&D sx's for MRSA infection   SHOULDER ARTHROSCOPY WITH DISTAL CLAVICLE RESECTION Left 1977   SHOULDER SURGERY Left 1976   TOTAL HIP ARTHROPLASTY Left 11/23/2015   Procedure: LEFT TOTAL HIP  ARTHROPLASTY ANTERIOR APPROACH;  Surgeon: Durene Romans, MD;  Location: [facility];  Service: Orthopedics;  Laterality: Left;   TOTAL HIP REVISION     10/14/16 Dr. 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   Coronary artery disease involving native coronary artery without angina pectoris 08/07/2022   Agatston CAC score 200-399 08/07/2022   Carpal tunnel syndrome on both sides 08/07/2022   Screening for lipid  disorders 06/24/2021   Fracture of greater trochanter (HCC) 01/17/2020   Obese 10/15/2016   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/14/2016   History of nephrolithiasis 07/24/2016   Neuropathy 07/24/2016   Calculus of kidney 07/24/2016   History of repair of hip joint 11/23/2015    ONSET DATE: Chronic  REFERRING DIAG:  R42 (ICD-10-CM) - Dizziness  M25.551,M25.552,G89.29 (ICD-10-CM) - Chronic pain of both hips  M25.511,G89.29,M25.512 (ICD-10-CM) - Chronic pain of both shoulders    THERAPY DIAG:  Abnormal posture  Muscle weakness (generalized)  Stiffness of left hip, not elsewhere classified  Other abnormalities of gait and mobility  Unsteadiness on feet  Rationale for Evaluation and Treatment: Rehabilitation  SUBJECTIVE:                                                                                                                                                                                             SUBJECTIVE STATEMENT: "I feel pretty good. I haven't fallen. A few trips, but no falls."  Pt accompanied by: self  PERTINENT HISTORY: Last hip surgery 1.5 years ago; L hip surgery x 5, R shoulder surgery  PAIN:  Are you having pain? No  PRECAUTIONS: Fall  WEIGHT BEARING RESTRICTIONS: No  FALLS: Has patient fallen in last 6 months? Yes. Number of falls 1 slid out of bed  LIVING ENVIRONMENT: Lives with: lives with their spouse Lives in: House/apartment Stairs: No Has following equipment at home: None  PLOF: Independent  PATIENT GOALS: Get off using cane  OBJECTIVE:   DIAGNOSTIC FINDINGS: Nothing recent on file  COGNITION: Overall cognitive status: Within functional limits for tasks assessed   SENSATION: A little neuropathy on outside of both feet  MUSCLE LENGTH: Maisie Fus test: Right -10 deg; Left -10 deg Quad: R 90 deg,  L 90 deg   09/30/22  Quad: B 95 deg L 100 deg passive, R 98 deg passive  10/30/22: Ely: 100 degrees bilateral   POSTURE: rounded shoulders,  increased thoracic kyphosis, flexed trunk , weight shift right, and L foot abducted, L iliac crest elevated compared to R, slight L knee bend  LOWER EXTREMITY ROM:     Active  Right Eval Left Eval  Hip flexion    Hip extension    Hip abduction    Hip adduction    Hip internal rotation    Hip external rotation    Knee flexion    Knee extension    Ankle dorsiflexion    Ankle plantarflexion    Ankle inversion    Ankle eversion     (Blank rows = not tested)  LOWER EXTREMITY MMT:    MMT Right Eval Left Eval  Hip flexion 4- 4-  Hip extension 3- 2+  Hip abduction 4+ 3-  Hip adduction    Hip internal rotation 5 3  Hip external rotation 4+ 3  Knee flexion 5 5  Knee extension 5 5  Ankle dorsiflexion    Ankle plantarflexion    Ankle inversion    Ankle eversion    (Blank rows = not tested)  BED MOBILITY:  Sit to supine SBA Supine to sit SBA Rolling to Right SBA Rolling to Left SBA   STAIRS: Level of Assistance: SBA Stair Negotiation Technique: Alternating Pattern  with Bilateral Rails Number of Stairs: 5  Height of Stairs: 4-6"   GAIT: Gait pattern:  L foot slap, L foot externally rotates during swing, wide BOS, leans on cane on R with R shoulder hiked/elevated, antalgic Distance walked: Within clinic Assistive device utilized: Single point cane Level of assistance: SBA  FUNCTIONAL TESTS:  5 times sit to stand: 18.11 sec Berg Balance Scale: 38/56  09/30/22 46/56 Dynamic Gait Index: 19/24  09/30/22 20/24   PATIENT SURVEYS:  TBA  TODAY'S TREATMENT:     [facility] Adult PT Treatment:                                                DATE: 11/13/22 Therapeutic Exerscise: Standing hip extension 2 x 10 maintaining staggered stance for brief second  Standing hip abduction x 10 each  Standing hip flexion x 10 each   Neuromuscular re-ed: Normal stance on foam x 30 seconds Normal stance on foam with head turns x 10 each vertical and horizontal Romberg with reaches tapping  therapist hand x 10  Standing with trunk rotation looking back x 10 each Semi-tandem 2 x 30 sec each Stance with perturbations x 10    [facility] Adult PT Treatment:                                                DATE: 10/30/22 Therapeutic Exercise: Ladder stepping fwd and lateral 2 sets each  SLR 2 x 10 each  Updated HEP   Neuromuscular re-ed: SBA to CGA Romberg x 30 sec  Normal stance eyes closed x 30 sec  Narrow stance eyes closed x 30 sec  Romberg eyes closed 2 x 30 sec  Normal stance on foam x 30 sec  Romberg on foam 2  x 30 sec  SL cone tap x 10 each; Single UE support    [facility] Adult PT Treatment:                                                DATE: 10/22/22 Therapeutic Exercise: Standing march 2 x 10  Step ups aerobic stepper LLE leading 2 x 6 CGA Step taps aerobic stepper and 1 insert 2 x 10 each CGA Side step at counter 3 sets d/b Toe raises 2 x 10  Backward walking 3 sets d/b at counter CGA Sit to stand with overhead ball reach 3 x 5    [facility] Adult PT Treatment:                                                DATE: 10/16/22/24  RECERT Therapeutic Exercise: Nustep L6 x 6 min LEs only  Neuromuscular re-ed: Standing hip rotation to floor dots  Standing reaches to tall poles B to increase trunk rotation  Standing step ups to mid stairs B Standing holding hip flexion 3 sec  Turning in circles B no UE support Alternating step tap 4" step 2x10 One foot on 6" step x30 sec B One foot on 6" step diagonals red med ball 2x10    [facility] Adult PT Treatment:                                                DATE: 09/30/22 Therapeutic Exercise: Nustep L5 x 6 min LEs only    Therapeutic Activities BERG and DGI completed Gait: Gait x 400 ft no AD       PATIENT EDUCATION: Education details: HEP update Person educated: Patient Education method: Explanation, demo, cues, handout Education comprehension: verbalized understanding, returned demo, cues   HOME EXERCISE PROGRAM: Access  Code: [email or web address] URL: [email or web address] Date: 11/13/2022 Prepared by: Letitia Libra  Exercises - Prone Quadriceps Stretch with Strap  - 1 x daily - 7 x weekly - 2 sets - 30 sec hold - Seated Hamstring Stretch  - 1 x daily - 7 x weekly - 2 sets - 30 sec hold - Seated Hip Flexor Stretch  - 1 x daily - 7 x weekly - 2 sets - 30 sec hold - Standing Hip Extension with Counter Support  - 1 x daily - 7 x weekly - 2 sets - 10 reps - Standing Hip Abduction  - 1 x daily - 7 x weekly - 2 sets - 10 reps - Heel Raises with Counter Support  - 1 x daily - 7 x weekly - 2 sets - 10 reps - Side Stepping with Counter Support  - 1 x daily - 7 x weekly - 2 sets - 10 reps - Staggered Stance Forward Backward Weight Shift with Unilateral Counter Support  - 1 x daily - 7 x weekly - 2 sets - 10 reps - Tap foot forward  - 1 x daily - 7 x weekly - 2 sets - 10 reps - Standing Balance Activity: Stepping Out in Transverse Plane for Hip Rotation  - 1 x daily -  3-4 x weekly - 2 sets - 10 reps - Standing Reach to Opposite Side with Weight Shift  - 1 x daily - 3-4 x weekly - 2 sets - 10 reps - Romberg Stance with Head Rotation  - 1 x daily - 7 x weekly - 2 sets - 30 sec hold - Romberg Stance with Head Nods  - 1 x daily - 7 x weekly - 2 sets - 30 sec hold - Standing Balance with Eyes Closed  - 1 x daily - 7 x weekly - 3 sets - 30 sec  hold - Standing Romberg to 1/2 Tandem Stance  - 1 x daily - 7 x weekly - 3 sets - 30 sec  hold  GOALS: Goals reviewed with patient? Yes  SHORT TERM GOALS: Target date: 09/18/2022    Pt will be ind with initial HEP Baseline: Goal status: MET  2.  Pt will demo increased quad/hip flexor flexibility with at least 100 deg of knee bend during Ely test Baseline:  Goal status: MET  3.  Pt will be independent with maintaining increased trunk/hip extension in standing Baseline:  10/30/22: cues required for erect posture throughout session.  11/13/22: intermittent cues required to  maintain erect posture  Goal status: IN PROGRESS   LONG TERM GOALS: Target date: 10/16/2022 extended to 11/28/22   Pt will be ind with management and progression of HEP Baseline:  Goal status: IN PROGRESS  2.  Pt will have increased Berg Balance score of >/=52/56 to demo decreased fall risk Baseline:  Goal status: REVISED  3.  Pt will demo improved DGI to >/=22/24 to demo decreased fall risk Baseline:  Goal status: REVISED  4.  Pt will be able to amb ind x400' per pt's personal goals Baseline:  Goal status: MET   ASSESSMENT:  CLINICAL IMPRESSION: Continued emphasis on static and dynamic balance activity and hip strengthening. Patient had 1 LOB during normal stance on foam requiring therapist assist to keep from falling. Otherwise no LOB with balance progression with PT maintaining SBA for all activity. Worked on reactive balance with patient able to complete small backward/forward steps to regain balance with perturbations. Mild difficulty with reaching outside of his BOS, but able to maintain balance independently. Intermittent cues required to maintain upright posture as he continues to have tendency to revert to forward flexed posture.   OBJECTIVE IMPAIRMENTS: Abnormal gait, decreased activity tolerance, decreased balance, decreased endurance, decreased mobility, difficulty walking, decreased ROM, decreased strength, increased fascial restrictions, increased muscle spasms, impaired flexibility, improper body mechanics, and postural dysfunction.   PLAN:  PT FREQUENCY: 2x/week  PT DURATION: 8 weeks  PLANNED INTERVENTIONS: Therapeutic exercises, Therapeutic activity, Neuromuscular re-education, Balance training, Gait training, Patient/Family education, Self Care, Joint mobilization, Aquatic Therapy, Dry Needling, Electrical stimulation, Spinal mobilization, Cryotherapy, Moist heat, Taping, Vasopneumatic device, Ionotophoresis 4mg /ml Dexamethasone, Manual therapy, and  Re-evaluation  PLAN FOR NEXT SESSION: Continue to work on high level balance, gait, trunk rotation, SLS activities to strengthen L gluteals; PROGRESS NOTE  Letitia Libra, PT, DPT, ATC 11/13/22 2:37 PM

## 2022-11-20 ENCOUNTER — Telehealth: Payer: Self-pay

## 2022-11-20 ENCOUNTER — Ambulatory Visit: Payer: Medicare Other

## 2022-11-20 NOTE — Telephone Encounter (Signed)
Spoke with patient regarding missed PT appointment. He forgot about scheduled visit. Confirmed next appointment.   Letitia Libra, PT, DPT, ATC 11/20/22 4:34 PM

## 2022-11-27 ENCOUNTER — Ambulatory Visit: Payer: Medicare Other

## 2022-11-27 DIAGNOSIS — R2681 Unsteadiness on feet: Secondary | ICD-10-CM | POA: Diagnosis not present

## 2022-11-27 DIAGNOSIS — M25652 Stiffness of left hip, not elsewhere classified: Secondary | ICD-10-CM

## 2022-11-27 DIAGNOSIS — R2689 Other abnormalities of gait and mobility: Secondary | ICD-10-CM

## 2022-11-27 DIAGNOSIS — M6281 Muscle weakness (generalized): Secondary | ICD-10-CM

## 2022-11-27 DIAGNOSIS — R293 Abnormal posture: Secondary | ICD-10-CM

## 2022-11-27 NOTE — Therapy (Signed)
OUTPATIENT PHYSICAL THERAPY NEURO TREATMENT   Progress Note Reporting Period 08/21/22 to 11/27/22  See note below for Objective Data and Assessment of Progress/Goals.   PHYSICAL THERAPY DISCHARGE SUMMARY  Visits from Start of Care: 10  Current functional level related to goals / functional outcomes: See goals below   Remaining deficits: See impression below   Education / Equipment: See education below    Patient agrees to discharge. Patient goals were partially met. Patient is being discharged due to being pleased with the current functional level.    Patient Name: Jacob Mitchell MRN: [id number] DOB:05/06/1945, 77 y.o., male Today's Date: 11/27/2022   PCP: Everrett Coombe, DO REFERRING PROVIDER: Everrett Coombe, DO  END OF SESSION:  PT End of Session - 11/27/22 1057     Visit Number 10    Number of Visits 16    Date for PT Re-Evaluation 11/28/22    Authorization Type Medicare    Progress Note Due on Visit 10    PT Start Time 1100    PT Stop Time 1140    PT Time Calculation (min) 40 min    Equipment Utilized During Treatment Gait belt    Activity Tolerance Patient tolerated treatment well    Behavior During Therapy WFL for tasks assessed/performed                    Past Medical History:  Diagnosis Date   Arm DVT (deep venous thromboembolism), acute, left (HCC) 03/27/2021   Benign localized prostatic hyperplasia with lower urinary tract symptoms (LUTS)    Complication of anesthesia    pt says he gets "restless" when he is waking up and nurses have had to "hold him down"- happened several surgeries ago    Diverticulosis of colon    History of colon polyps    History of kidney stones    History of MRSA infection 2007   post right shoulder surgery   History of urinary retention 05/31/2017   due to BPH   Incomplete emptying of bladder    Left arm swelling 08/21/2020   OA (osteoarthritis)    Peripheral neuropathy    feet-- hx frost bite   Pinched  nerve in neck    right index and middle fingers numbness   Wears glasses    Wears hearing aid in both ears    Past Surgical History:  Procedure Laterality Date   CATARACT EXTRACTION Left 01/13/2020   COLONOSCOPY     CYST REMOVAL LEG Right 1975   CYSTOSCOPY WITH INSERTION OF UROLIFT N/A 07/20/2017   Procedure: CYSTOSCOPY WITH INSERTION OF UROLIFT;  Surgeon: Malen Gauze, MD;  Location[facility];  Service: Urology;  Laterality: N/A;   CYSTOSCOPY WITH INSERTION OF UROLIFT     DISTAL CLAVICLE EXCISION Left 1977   left shoulder   ELBOW SURGERY Right 1969;  1980   HAND SURGERY Right 1968   INCISION AND DRAINAGE HIP Left 05/01/2020   Procedure: IRRIGATION AND DEBRIDEMENT LEFT HIP SEROMA;  Surgeon: Durene Romans, MD;  Location: [facility];  Service: Orthopedics;  Laterality: Left;  90 mins   LAMINECTOMY  1970   L4-5   LUMBAR FUSION  2013  approx.   L1--L5   ORIF HIP FRACTURE Left 01/17/2020   Procedure: OPEN REDUCTION INTERNAL FIXATION LEFT HIP GREATER TROCHANTER FRACTURE;  Surgeon: Durene Romans, MD;  Location: [facility];  Service: Orthopedics;  Laterality: Left;  90 MINS   ORIF PERIPROSTHETIC FRACTURE Left 02/27/2020   Procedure:  OPEN REDUCTION INTERNAL FIXATION (ORIF) LEFT PERI-PROSTETIC  HIP FRACTURE;  Surgeon: Durene Romans, MD;  Location: [facility];  Service: Orthopedics;  Laterality: Left;    ROTATOR CUFF REPAIR Right x5    last one 2007   5 surgeries in 9 days--- original repair then 4 sx'  I&D sx's for MRSA infection   SHOULDER ARTHROSCOPY WITH DISTAL CLAVICLE RESECTION Left 1977   SHOULDER SURGERY Left 1976   TOTAL HIP ARTHROPLASTY Left 11/23/2015   Procedure: LEFT TOTAL HIP ARTHROPLASTY ANTERIOR APPROACH;  Surgeon: Durene Romans, MD;  Location: [facility];  Service: Orthopedics;  Laterality: Left;   TOTAL HIP REVISION     10/14/16 Dr. 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   Coronary artery disease involving native coronary artery without angina pectoris 08/07/2022   Agatston CAC score 200-399 08/07/2022   Carpal tunnel syndrome on both sides 08/07/2022   Screening for lipid disorders 06/24/2021   Fracture of greater trochanter (HCC) 01/17/2020   Obese 10/15/2016   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/14/2016   History of nephrolithiasis 07/24/2016   Neuropathy 07/24/2016   Calculus of kidney 07/24/2016   History of repair of hip joint 11/23/2015    ONSET DATE: Chronic  REFERRING DIAG:  R42 (ICD-10-CM) - Dizziness  M25.551,M25.552,G89.29 (ICD-10-CM) - Chronic pain of both hips  M25.511,G89.29,M25.512 (ICD-10-CM) - Chronic pain of both shoulders    THERAPY DIAG:  Abnormal posture  Muscle weakness (generalized)  Stiffness of left hip, not elsewhere classified  Other abnormalities of gait and mobility  Unsteadiness on feet  Rationale for Evaluation and Treatment: Rehabilitation  SUBJECTIVE:                                                                                                                                                                                             SUBJECTIVE STATEMENT: Patient reports he seems to be doing well. Has been going without the cane for the past 3 days. He feels that he is ready for discharge and can continue his HEP independently. No recent falls.  Pt accompanied by: self  PERTINENT HISTORY: Last hip surgery 1.5 years ago; L hip surgery x 5, R shoulder surgery  PAIN:  Are you having pain? No  PRECAUTIONS: Fall  WEIGHT BEARING RESTRICTIONS: No  FALLS: Has patient fallen in last 6 months?  Yes. Number of falls 1 slid out of bed  LIVING ENVIRONMENT: Lives with: lives with their spouse Lives in: House/apartment Stairs: No Has following equipment at home: None  PLOF: Independent  PATIENT GOALS: Get off using cane  OBJECTIVE:   DIAGNOSTIC FINDINGS: Nothing recent on file  COGNITION: Overall cognitive status: Within functional limits for tasks assessed   SENSATION: A little neuropathy on outside of both feet  MUSCLE LENGTH: Maisie Fus test: Right -10 deg; Left -10 deg Quad: R 90 deg, L 90 deg   09/30/22  Quad: B 95 deg L 100 deg passive, R 98 deg passive  10/30/22: Ely: 100 degrees bilateral   POSTURE: rounded shoulders, increased thoracic kyphosis, flexed trunk , weight shift right, and L foot abducted, L iliac crest elevated compared to R, slight L knee bend  LOWER EXTREMITY ROM:     Active  Right Eval Left Eval  Hip flexion    Hip extension    Hip abduction    Hip adduction    Hip internal rotation    Hip external rotation    Knee flexion    Knee extension    Ankle dorsiflexion    Ankle plantarflexion    Ankle inversion    Ankle eversion     (Blank rows = not tested)  LOWER EXTREMITY MMT:    MMT Right Eval Left Eval 11/27/22  Hip flexion 4- 4- 4+ bilateral   Hip extension 3- 2+ 3- bilateral  Hip abduction 4+ 3- Rt:5; Lt: 4-  Hip adduction     Hip internal rotation 5 3 Rt:5; Lt: 4-   Hip external rotation 4+ 3 4+ bilateral  Knee flexion 5 5   Knee extension 5 5   Ankle dorsiflexion     Ankle plantarflexion     Ankle inversion     Ankle eversion     (Blank rows = not tested)  BED MOBILITY:  Sit to supine SBA Supine to sit SBA Rolling to Right SBA Rolling to Left SBA   STAIRS: Level of Assistance: SBA Stair Negotiation Technique: Alternating Pattern  with Bilateral Rails Number of Stairs: 5  Height of Stairs: 4-6"   GAIT: Gait pattern:  L foot slap, L foot externally rotates during swing, wide BOS, leans on cane on R with R  shoulder hiked/elevated, antalgic Distance walked: Within clinic Assistive device utilized: Single point cane Level of assistance: SBA  FUNCTIONAL TESTS:  5 times sit to stand: 18.11 sec Berg Balance Scale: 38/56  09/30/22 46/56; 11/20/22:  Dynamic Gait Index: 19/24  09/30/22 20/24; 11/20/22:   11/27/22: DGI: 21/24  BERG BALANCE TEST Sitting to Standing: 4.      Stands without using hands and stabilize independently Standing Unsupported: 4.      Stands safely for 2 minutes Sitting Unsupported: 4.     Sits for 2 minutes independently Standing to Sitting: 4.     Sits safely with minimal use of hands Transfers: 4.     Transfers safely with minor use of hands Standing with eyes closed: 4.     Stands safely for 10 seconds  Standing with feet together: 4.     Stands for 1 minute safely Reaching forward with outstretched arm: 3.     Reaches forward 5 inches Retrieving object from the floor: 4.      Able to pick  up easily and safely Turning to look behind: 4.     Looks behind from both sides and weight shifts well Turning 360 degrees: 4.     Able to turn in </=4 seconds  Place alternate foot on stool: 3.     Completes 8 steps in >20 seconds Standing with one foot in front: 3.     Independent foot ahead for 30 seconds Standing on one foot: 0.     Unable  Total Score: 49/56  PATIENT SURVEYS:  TBA  TODAY'S TREATMENT:      [facility] Adult PT Treatment:                                                DATE: 11/20/22 Therapeutic Exercise: Reviewed and performed HEP discussing frequency, sets, reps, and ways to progress independently   Therapeutic Activity: Re-assessment to determine overall progress, educating patient on progress towards goals.    [facility] Adult PT Treatment:                                                DATE: 11/13/22 Therapeutic Exerscise: Standing hip extension 2 x 10 maintaining staggered stance for brief second  Standing hip abduction x 10 each  Standing hip flexion x 10  each   Neuromuscular re-ed: Normal stance on foam x 30 seconds Normal stance on foam with head turns x 10 each vertical and horizontal Romberg with reaches tapping therapist hand x 10  Standing with trunk rotation looking back x 10 each Semi-tandem 2 x 30 sec each Stance with perturbations x 10    [facility] Adult PT Treatment:                                                DATE: 10/30/22 Therapeutic Exercise: Ladder stepping fwd and lateral 2 sets each  SLR 2 x 10 each  Updated HEP   Neuromuscular re-ed: SBA to CGA Romberg x 30 sec  Normal stance eyes closed x 30 sec  Narrow stance eyes closed x 30 sec  Romberg eyes closed 2 x 30 sec  Normal stance on foam x 30 sec  Romberg on foam 2 x 30 sec  SL cone tap x 10 each; Single UE support          PATIENT EDUCATION: Education details: see treatment; d/c education  Person educated: Patient Education method: Explanation, demo, cues, handout Education comprehension: verbalized understanding, returned demo  HOME EXERCISE PROGRAM: Access Code: [email or web address] URL: [email or web address] Date: 11/13/2022 Prepared by: Letitia Libra  Exercises - Prone Quadriceps Stretch with Strap  - 1 x daily - 7 x weekly - 2 sets - 30 sec hold - Seated Hamstring Stretch  - 1 x daily - 7 x weekly - 2 sets - 30 sec hold - Seated Hip Flexor Stretch  - 1 x daily - 7 x weekly - 2 sets - 30 sec hold - Standing Hip Extension with Counter Support  - 1 x daily - 7 x weekly - 2 sets - 10 reps - Standing Hip Abduction  - 1 x daily - 7  x weekly - 2 sets - 10 reps - Heel Raises with Counter Support  - 1 x daily - 7 x weekly - 2 sets - 10 reps - Side Stepping with Counter Support  - 1 x daily - 7 x weekly - 2 sets - 10 reps - Staggered Stance Forward Backward Weight Shift with Unilateral Counter Support  - 1 x daily - 7 x weekly - 2 sets - 10 reps - Tap foot forward  - 1 x daily - 7 x weekly - 2 sets - 10 reps - Standing Balance Activity: Stepping Out in  Transverse Plane for Hip Rotation  - 1 x daily - 3-4 x weekly - 2 sets - 10 reps - Standing Reach to Opposite Side with Weight Shift  - 1 x daily - 3-4 x weekly - 2 sets - 10 reps - Romberg Stance with Head Rotation  - 1 x daily - 7 x weekly - 2 sets - 30 sec hold - Romberg Stance with Head Nods  - 1 x daily - 7 x weekly - 2 sets - 30 sec hold - Standing Balance with Eyes Closed  - 1 x daily - 7 x weekly - 3 sets - 30 sec  hold - Standing Romberg to 1/2 Tandem Stance  - 1 x daily - 7 x weekly - 3 sets - 30 sec  hold  GOALS: Goals reviewed with patient? Yes  SHORT TERM GOALS: Target date: 09/18/2022    Pt will be ind with initial HEP Baseline: Goal status: MET  2.  Pt will demo increased quad/hip flexor flexibility with at least 100 deg of knee bend during Ely test Baseline:  Goal status: MET  3.  Pt will be independent with maintaining increased trunk/hip extension in standing Baseline:  10/30/22: cues required for erect posture throughout session.  11/13/22: intermittent cues required to maintain erect posture  11/27/22: maintains erect posture throughout session today  Goal status: MET   LONG TERM GOALS: Target date: 10/16/2022 extended to 11/28/22   Pt will be ind with management and progression of HEP Baseline:  Goal status: MET  2.  Pt will have increased Berg Balance score of >/=52/56 to demo decreased fall risk Baseline:  Goal status: nearly met   3.  Pt will demo improved DGI to >/=22/24 to demo decreased fall risk Baseline:  Goal status: nearly met  4.  Pt will be able to amb ind x400' per pt's personal goals Baseline:  Goal status: MET   ASSESSMENT:  CLINICAL IMPRESSION: Ofir has attended 10 PT sessions since the start of care reporting an overall improvement in his balance and stability. He is pleased with his overall progress and feels he can continue to progress his strength and balance independently. He demonstrates improvements in lower extremity strength  and overall balance as measured by BERG and DGI. Continues to have notable deficits with narrow base static balance and stepping over obstacle as well as gluteal weakness. We discussed continued use of SPC for community ambulation with patient verbalizing understanding. He is appropriate for discharge at this time with patient in agreement with this plan.   OBJECTIVE IMPAIRMENTS: Abnormal gait, decreased activity tolerance, decreased balance, decreased endurance, decreased mobility, difficulty walking, decreased ROM, decreased strength, increased fascial restrictions, increased muscle spasms, impaired flexibility, improper body mechanics, and postural dysfunction.   PLAN:  PT FREQUENCY: 2x/week  PT DURATION: 8 weeks  PLANNED INTERVENTIONS: Therapeutic exercises, Therapeutic activity, Neuromuscular re-education, Balance training, Gait training, Patient/Family  education, Self Care, Joint mobilization, Aquatic Therapy, Dry Needling, Electrical stimulation, Spinal mobilization, Cryotherapy, Moist heat, Taping, Vasopneumatic device, Ionotophoresis 4mg /ml Dexamethasone, Manual therapy, and Re-evaluation  PLAN FOR NEXT SESSION: N/A  Letitia Libra, PT, DPT, ATC 11/27/22 12:18 PM

## 2023-01-01 ENCOUNTER — Encounter: Payer: Self-pay | Admitting: Family Medicine

## 2023-01-01 ENCOUNTER — Ambulatory Visit (INDEPENDENT_AMBULATORY_CARE_PROVIDER_SITE_OTHER): Payer: Medicare Other | Admitting: Family Medicine

## 2023-01-01 VITALS — BP 126/85 | HR 64 | Ht 79.0 in | Wt 303.0 lb

## 2023-01-01 DIAGNOSIS — I2510 Atherosclerotic heart disease of native coronary artery without angina pectoris: Secondary | ICD-10-CM

## 2023-01-01 DIAGNOSIS — R931 Abnormal findings on diagnostic imaging of heart and coronary circulation: Secondary | ICD-10-CM

## 2023-01-01 DIAGNOSIS — G4733 Obstructive sleep apnea (adult) (pediatric): Secondary | ICD-10-CM

## 2023-01-01 NOTE — Progress Notes (Signed)
Jacob Mitchell - 77 y.o. male MRN [id number]  Date of birth: [birth year]  Subjective Chief Complaint  Patient presents with   Hyperlipidemia   Sleep Apnea   Cough    HPI Jacob Mitchell is a 77 y.o. male here today for follow up visit.   He was referred to hand surgery at last appt for CTS.  He reports that he has had improvement of his symptoms in the right hand.  He is having the left hand completed tomorrow.  She would rather Roma know she is in because her team having around again.   He was noted to have elevated Agatston score.  Atorvastatin started at last visit.  Due to have recheck of labs.    He did has HST. This returned showing severe OSA.  Full report not available in CareEverywhere.  He does qualify to have CPAP equipment provided through [employer].    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   Shellfish Allergy     Past Medical History:  Diagnosis Date   Arm DVT (deep venous thromboembolism), acute, left (HCC) 03/27/2021   Benign localized prostatic hyperplasia with lower urinary tract symptoms (LUTS)    Complication of anesthesia    pt says he gets "restless" when he is waking up and nurses have had to "hold him down"- happened several surgeries ago    Diverticulosis of colon    History of colon polyps    History of kidney stones    History of MRSA infection 2007   post right shoulder surgery   History of urinary retention 05/31/2017   due to BPH   Incomplete emptying of bladder    Left arm swelling 08/21/2020   OA (osteoarthritis)    Peripheral neuropathy    feet-- hx frost bite   Pinched nerve in neck    right index and middle fingers numbness   Wears glasses    Wears hearing aid in both ears     Past Surgical History:  Procedure  Laterality Date   CATARACT EXTRACTION Left 01/13/2020   COLONOSCOPY     CYST REMOVAL LEG Right 1975   CYSTOSCOPY WITH INSERTION OF UROLIFT N/A 07/20/2017   Procedure: CYSTOSCOPY WITH INSERTION OF UROLIFT;  Surgeon: Malen Gauze, MD;  Location: [facility];  Service: Urology;  Laterality: N/A;   CYSTOSCOPY WITH INSERTION OF UROLIFT     DISTAL CLAVICLE EXCISION Left 1977   left shoulder   ELBOW SURGERY Right 1969;  1980   HAND SURGERY Right 1968   INCISION AND DRAINAGE HIP Left 05/01/2020   Procedure: IRRIGATION AND DEBRIDEMENT LEFT HIP SEROMA;  Surgeon: Durene Romans, MD;  Location: [facility];  Service: Orthopedics;  Laterality: Left;  90 mins   LAMINECTOMY  1970   L4-5   LUMBAR FUSION  2013  approx.   L1--L5   ORIF HIP FRACTURE Left 01/17/2020   Procedure: OPEN REDUCTION INTERNAL FIXATION LEFT HIP GREATER TROCHANTER FRACTURE;  Surgeon: Durene Romans, MD;  Location: [facility];  Service: Orthopedics;  Laterality: Left;  90 MINS   ORIF PERIPROSTHETIC FRACTURE Left 02/27/2020   Procedure: OPEN REDUCTION INTERNAL FIXATION (ORIF) LEFT PERI-PROSTETIC  HIP FRACTURE;  Surgeon: Durene Romans, MD;  Location: [facility];  Service: Orthopedics;  Laterality: Left;    ROTATOR CUFF REPAIR Right x5    last one 2007   5 surgeries in 9 days--- original repair then 4 sx'  I&D sx's for MRSA infection   SHOULDER ARTHROSCOPY WITH DISTAL CLAVICLE RESECTION Left 1977   SHOULDER SURGERY Left 1976   TOTAL HIP ARTHROPLASTY Left 11/23/2015   Procedure: LEFT TOTAL HIP ARTHROPLASTY ANTERIOR APPROACH;  Surgeon: Durene Romans, MD;  Location: [facility];  Service: Orthopedics;  Laterality: Left;   TOTAL HIP REVISION     10/14/16 Dr. Charlann Boxer   TOTAL HIP REVISION Left 10/14/2016   Procedure: TOTAL HIP REVISION POSTERIOR APPROACH;  Surgeon: Durene Romans, MD;  Location: [facility];  Service: Orthopedics;  Laterality: Left; (total of 4 hip surgeries in all)    Social History   Socioeconomic History   Marital status:  Married    Spouse name: Not on file   Number of children: Not on file   Years of education: Not on file   Highest education level: Bachelor's degree (e.g., BA, AB, BS)  Occupational History   Not on file  Tobacco Use   Smoking status: Never   Smokeless tobacco: Never  Vaping Use   Vaping status: Never Used  Substance and Sexual Activity   Alcohol use: No   Drug use: No   Sexual activity: Yes  Other Topics Concern   Not on file  Social History Narrative   Not on file   Social Determinants of Health   Financial Resource Strain: Low Risk  (12/31/2022)   Overall Financial Resource Strain (CARDIA)    Difficulty of Paying Living Expenses: Not hard at all  Food Insecurity: No Food Insecurity (12/31/2022)   Hunger Vital Sign    Worried About Running Out of Food in the Last Year: Never true    Ran Out of Food in the Last Year: Never true  Transportation Needs: No Transportation Needs (12/31/2022)   PRAPARE - Administrator, Civil Service (Medical): No    Lack of Transportation (Non-Medical): No  Physical Activity: Insufficiently Active (12/31/2022)   Exercise Vital Sign    Days of Exercise per Week: 3 days    Minutes of Exercise per Session: 30 min  Stress: No Stress Concern Present (12/31/2022)   [employer] of Occupational Health - Occupational Stress Questionnaire    Feeling of Stress : Not at all  Social Connections: Socially Integrated (12/31/2022)   Social Connection and Isolation Panel [NHANES]    Frequency of Communication with Friends and Family: More than three times a week    Frequency of Social Gatherings with Friends and Family: Twice a week    Attends Religious Services: More than 4 times per year    Active Member of [employer] or Organizations: Yes    Attends Engineer, structural: More than 4 times per year    Marital Status: Married    Family History  Problem Relation Age of Onset   Alcohol abuse Father    Drug abuse Brother      Health Maintenance  Topic Date Due   Medicare Annual Wellness (AWV)  Never done   DTaP/Tdap/Td (1 - Tdap) Never done   Pneumonia Vaccine 11+ Years old (1 of 1 - PCV) Never done   COVID-19 Vaccine (4 - 2023-24 season) 04/15/2023 (Originally 10/12/2022)   INFLUENZA VACCINE  05/11/2023 (Originally 09/11/2022)   Hepatitis C Screening  08/19/2023 (Originally 07/30/1963)   Zoster Vaccines- Shingrix  Completed   HPV VACCINES  Aged Out     ----------------------------------------------------------------------------------------------------------------------------------------------------------------------------------------------------------------- Physical Exam BP 126/85 (BP Location: Left Arm, Patient Position: Sitting, Cuff Size: Large)   Pulse 64   Ht 6\' 7"  (2.007 m)   Wt (!) 303 lb (137.4 kg)   SpO2 97%   BMI 34.13 kg/m   Physical Exam Constitutional:      Appearance: Normal appearance.  Eyes:     General: No scleral icterus. Cardiovascular:     Rate and Rhythm: Normal rate and regular rhythm.  Pulmonary:     Effort: Pulmonary effort is normal.     Breath sounds: Normal breath sounds.  Neurological:     General: No focal deficit present.     Mental Status: He is alert.  Psychiatric:        Mood and Affect: Mood normal.        Behavior: Behavior normal.     ------------------------------------------------------------------------------------------------------------------------------------------------------------------------------------------------------------------- Assessment and Plan  OSA (obstructive sleep apnea) Noted to have severe sleep apnea on recent sleep study.  Copies of sleep study requested.  He may have option to obtain CPAP machine through [employer]  Agatston CAC score 200-399 Managing aggressively with atorvastatin.  Update lipids and hepatic panel.    No orders of the defined types were placed in this encounter.   Return in about 6 months (around  07/01/2023) for HLD/OSA.    This visit occurred during the SARS-CoV-2 public health emergency.  Safety protocols were in place, including screening questions prior to the visit, additional usage of staff PPE, and extensive cleaning of exam room while observing appropriate contact time as indicated for disinfecting solutions.

## 2023-01-01 NOTE — Assessment & Plan Note (Signed)
Managing aggressively with atorvastatin.  Update lipids and hepatic panel.

## 2023-01-01 NOTE — Assessment & Plan Note (Addendum)
Noted to have severe sleep apnea on recent sleep study.  Copies of sleep study requested.  He may have option to obtain CPAP machine through [employer]

## 2023-01-02 DIAGNOSIS — M65832 Other synovitis and tenosynovitis, left forearm: Secondary | ICD-10-CM | POA: Diagnosis not present

## 2023-01-02 DIAGNOSIS — M65842 Other synovitis and tenosynovitis, left hand: Secondary | ICD-10-CM | POA: Diagnosis not present

## 2023-01-02 DIAGNOSIS — M62542 Muscle wasting and atrophy, not elsewhere classified, left hand: Secondary | ICD-10-CM | POA: Diagnosis not present

## 2023-01-02 DIAGNOSIS — M67833 Other specified disorders of tendon, right wrist: Secondary | ICD-10-CM | POA: Diagnosis not present

## 2023-01-02 DIAGNOSIS — G5602 Carpal tunnel syndrome, left upper limb: Secondary | ICD-10-CM | POA: Diagnosis not present

## 2023-01-02 LAB — HEPATIC FUNCTION PANEL
ALT: 20 [IU]/L (ref 0–44)
AST: 21 [IU]/L (ref 0–40)
Albumin: 4.5 g/dL (ref 3.8–4.8)
Alkaline Phosphatase: 106 [IU]/L (ref 44–121)
Bilirubin Total: 0.8 mg/dL (ref 0.0–1.2)
Bilirubin, Direct: 0.26 mg/dL (ref 0.00–0.40)
Total Protein: 6.5 g/dL (ref 6.0–8.5)

## 2023-01-02 LAB — LIPID PANEL WITH LDL/HDL RATIO
Cholesterol, Total: 122 mg/dL (ref 100–199)
HDL: 35 mg/dL — ABNORMAL LOW (ref >39)
LDL Chol Calc (NIH): 69 mg/dL (ref 0–99)
LDL/HDL Ratio: 2 ratio (ref 0.0–3.6)
Triglycerides: 92 mg/dL (ref 0–149)
VLDL Cholesterol Cal: 18 mg/dL (ref 5–40)

## 2023-01-29 DIAGNOSIS — M25642 Stiffness of left hand, not elsewhere classified: Secondary | ICD-10-CM | POA: Diagnosis not present

## 2023-08-12 ENCOUNTER — Other Ambulatory Visit: Payer: Self-pay | Admitting: Family Medicine

## 2023-08-12 DIAGNOSIS — R931 Abnormal findings on diagnostic imaging of heart and coronary circulation: Secondary | ICD-10-CM

## 2023-08-12 DIAGNOSIS — I2510 Atherosclerotic heart disease of native coronary artery without angina pectoris: Secondary | ICD-10-CM

## 2023-08-12 NOTE — Telephone Encounter (Signed)
 Patient is scheduled on July 30th lab orders can be ordered and I will let him know

## 2023-08-12 NOTE — Telephone Encounter (Signed)
 Pls contact pt to schedule fasting labs (completed before appt) and 51-month follow-up with Dr. Alvia. Sending med refills. Thx

## 2023-09-09 ENCOUNTER — Telehealth: Payer: Self-pay | Admitting: Family Medicine

## 2023-09-09 ENCOUNTER — Ambulatory Visit: Admitting: Family Medicine

## 2023-09-16 ENCOUNTER — Ambulatory Visit: Admitting: Family Medicine

## 2023-10-01 ENCOUNTER — Encounter: Payer: Self-pay | Admitting: Family Medicine

## 2023-10-01 ENCOUNTER — Ambulatory Visit (INDEPENDENT_AMBULATORY_CARE_PROVIDER_SITE_OTHER): Admitting: Family Medicine

## 2023-10-01 VITALS — BP 129/78 | HR 67 | Ht 79.0 in | Wt 303.0 lb

## 2023-10-01 DIAGNOSIS — R931 Abnormal findings on diagnostic imaging of heart and coronary circulation: Secondary | ICD-10-CM | POA: Diagnosis not present

## 2023-10-01 DIAGNOSIS — Z23 Encounter for immunization: Secondary | ICD-10-CM

## 2023-10-01 DIAGNOSIS — Z87820 Personal history of traumatic brain injury: Secondary | ICD-10-CM | POA: Diagnosis not present

## 2023-10-01 DIAGNOSIS — I2510 Atherosclerotic heart disease of native coronary artery without angina pectoris: Secondary | ICD-10-CM | POA: Diagnosis not present

## 2023-10-01 MED ORDER — SOLIFENACIN SUCCINATE 5 MG PO TABS: 5.0000 mg | ORAL_TABLET | Freq: Every day | ORAL | 0 refills | Status: DC

## 2023-10-01 NOTE — Assessment & Plan Note (Signed)
Mild small vessel disease and moderate cerebral atrophy noted on MRI.  No significant cognitive impairment.

## 2023-10-01 NOTE — Assessment & Plan Note (Signed)
 Blood pressure is well-controlled.  Continue healthy diet and plan continue statin at current strength.  Obtain lipid panel.

## 2023-10-01 NOTE — Progress Notes (Signed)
 Jacob Mitchell - 78 y.o. male MRN [id number]  Date of birth: 1946/01/17  Subjective Chief Complaint  Patient presents with   Hyperlipidemia    HPI Jacob Mitchell is a 78 y.o. male here today for follow up visit.   He reports that he is doing pretty well.  Blood pressure remains well-controlled.  Atorvastatin  was added previously due to elevated Agatston score.  He is tolerating this well at current strength.  He denies chest pain, dyspnea.  We did have completed previously which showed OSA.  He was receiving CPAP through [facility] program in [place] but has not seen this yet.  He was having some problems with overactive bladder.  Having some urinary urgency and dribbling before making it to the restroom.  Denies pain with urination.  He does feel that he completely empties when he goes.  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   Shellfish Allergy     Past Medical History:  Diagnosis Date   Arm DVT (deep venous thromboembolism), acute, left (HCC) 03/27/2021   Benign localized prostatic hyperplasia with lower urinary tract symptoms (LUTS)    Complication of anesthesia    pt says he gets restless when he is waking up and nurses have had to hold him down- happened several surgeries ago    Diverticulosis of colon    History of colon polyps    History of kidney stones    History of MRSA infection 2007   post right shoulder surgery   History of urinary retention 05/31/2017   due to BPH   Incomplete emptying of bladder    Left arm swelling 08/21/2020   OA (osteoarthritis)    Peripheral neuropathy    feet-- hx frost bite   Pinched nerve in neck    right index and middle fingers numbness   Wears glasses    Wears hearing aid in both ears     Past Surgical  History:  Procedure Laterality Date   CATARACT EXTRACTION Left 01/13/2020   COLONOSCOPY     CYST REMOVAL LEG Right 1975   CYSTOSCOPY WITH INSERTION OF UROLIFT N/A 07/20/2017   Procedure: CYSTOSCOPY WITH INSERTION OF UROLIFT;  Surgeon: Sherrilee Belvie CROME, MD;  Location: [facility];  Service: Urology;  Laterality: N/A;   CYSTOSCOPY WITH INSERTION OF UROLIFT     DISTAL CLAVICLE EXCISION Left 1977   left shoulder   ELBOW SURGERY Right 1969;  1980   HAND SURGERY Right 1968   INCISION AND DRAINAGE HIP Left 05/01/2020   Procedure: IRRIGATION AND DEBRIDEMENT LEFT HIP SEROMA;  Surgeon: Ernie Cough, MD;  Location: [facility];  Service: Orthopedics;  Laterality: Left;  90 mins   LAMINECTOMY  1970   L4-5   LUMBAR FUSION  2013  approx.   L1--L5   ORIF HIP FRACTURE Left 01/17/2020   Procedure: OPEN REDUCTION INTERNAL FIXATION LEFT HIP GREATER TROCHANTER FRACTURE;  Surgeon: Ernie Cough, MD;  Location: [facility];  Service: Orthopedics;  Laterality: Left;  90 MINS   ORIF PERIPROSTHETIC FRACTURE Left 02/27/2020   Procedure: OPEN REDUCTION INTERNAL FIXATION (ORIF) LEFT PERI-PROSTETIC  HIP FRACTURE;  Surgeon: Ernie Cough, MD;  Location: [facility];  Service: Orthopedics;  Laterality: Left;    ROTATOR CUFF REPAIR Right  x5    last one 2007   5 surgeries in 9 days--- original repair then 4 sx'  I&D sx's for MRSA infection   SHOULDER ARTHROSCOPY WITH DISTAL CLAVICLE RESECTION Left 1977   SHOULDER SURGERY Left 1976   TOTAL HIP ARTHROPLASTY Left 11/23/2015   Procedure: LEFT TOTAL HIP ARTHROPLASTY ANTERIOR APPROACH;  Surgeon: Donnice Car, MD;  Location: [facility];  Service: Orthopedics;  Laterality: Left;   TOTAL HIP REVISION     10/14/16 Dr. Car   TOTAL HIP REVISION Left 10/14/2016   Procedure: TOTAL HIP REVISION POSTERIOR APPROACH;  Surgeon: Car Donnice, MD;  Location: [facility];  Service: Orthopedics;  Laterality: Left; (total of 4 hip surgeries in all)    Social History   Socioeconomic History    Marital status: Married    Spouse name: Not on file   Number of children: Not on file   Years of education: Not on file   Highest education level: Bachelor's degree (e.g., BA, AB, BS)  Occupational History   Not on file  Tobacco Use   Smoking status: Never   Smokeless tobacco: Never  Vaping Use   Vaping status: Never Used  Substance and Sexual Activity   Alcohol use: No   Drug use: No   Sexual activity: Yes  Other Topics Concern   Not on file  Social History Narrative   Not on file   Social Drivers of Health   Financial Resource Strain: Low Risk  (12/31/2022)   Overall Financial Resource Strain (CARDIA)    Difficulty of Paying Living Expenses: Not hard at all  Food Insecurity: No Food Insecurity (12/31/2022)   Hunger Vital Sign    Worried About Running Out of Food in the Last Year: Never true    Ran Out of Food in the Last Year: Never true  Transportation Needs: No Transportation Needs (12/31/2022)   PRAPARE - Administrator, Civil Service (Medical): No    Lack of Transportation (Non-Medical): No  Physical Activity: Insufficiently Active (12/31/2022)   Exercise Vital Sign    Days of Exercise per Week: 3 days    Minutes of Exercise per Session: 30 min  Stress: No Stress Concern Present (12/31/2022)   [employer] of Occupational Health - Occupational Stress Questionnaire    Feeling of Stress : Not at all  Social Connections: Socially Integrated (12/31/2022)   Social Connection and Isolation Panel    Frequency of Communication with Friends and Family: More than three times a week    Frequency of Social Gatherings with Friends and Family: Twice a week    Attends Religious Services: More than 4 times per year    Active Member of [employer] or Organizations: Yes    Attends Engineer, structural: More than 4 times per year    Marital Status: Married    Family History  Problem Relation Age of Onset   Alcohol abuse Father    Drug abuse Brother      Health Maintenance  Topic Date Due   Medicare Annual Wellness (AWV)  Never done   DTaP/Tdap/Td (1 - Tdap) Never done   COVID-19 Vaccine (4 - 2024-25 season) 10/12/2022   INFLUENZA VACCINE  09/11/2023   Pneumococcal Vaccine: 50+ Years  Completed   Zoster Vaccines- Shingrix  Completed   HPV VACCINES  Aged Out   Meningococcal B Vaccine  Aged Out     ----------------------------------------------------------------------------------------------------------------------------------------------------------------------------------------------------------------- Physical Exam BP 129/78 (BP Location: Left Arm, Patient Position: Sitting, Cuff Size: Large)  Pulse 67   Ht 6' 7 (2.007 m)   Wt (!) 303 lb (137.4 kg)   SpO2 97%   BMI 34.13 kg/m   Physical Exam Constitutional:      Appearance: Normal appearance.  Cardiovascular:     Rate and Rhythm: Normal rate and regular rhythm.  Pulmonary:     Effort: Pulmonary effort is normal.     Breath sounds: Normal breath sounds.  Musculoskeletal:     Cervical back: Neck supple.  Neurological:     General: No focal deficit present.     Mental Status: He is alert.  Psychiatric:        Mood and Affect: Mood normal.        Behavior: Behavior normal.     ------------------------------------------------------------------------------------------------------------------------------------------------------------------------------------------------------------------- Assessment and Plan  Coronary artery disease involving native coronary artery without angina pectoris Blood pressure is well-controlled.  Continue healthy diet and plan continue statin at current strength.  Obtain lipid panel.  Personal history of multiple concussions Mild small vessel disease and moderate cerebral atrophy noted on MRI.  No significant cognitive impairment.   Meds ordered this encounter  Medications   solifenacin  (VESICARE ) 5 MG tablet    Sig: Take 1 tablet (5  mg total) by mouth daily.    Dispense:  90 tablet    Refill:  0    Return in about 1 year (around 09/30/2024) for HLD, CAD.

## 2023-10-02 LAB — CMP14+EGFR
ALT: 15 IU/L (ref 0–44)
AST: 16 IU/L (ref 0–40)
Albumin: 4.5 g/dL (ref 3.8–4.8)
Alkaline Phosphatase: 108 IU/L (ref 44–121)
BUN/Creatinine Ratio: 23 (ref 10–24)
BUN: 27 mg/dL (ref 8–27)
Bilirubin Total: 0.7 mg/dL (ref 0.0–1.2)
CO2: 20 mmol/L (ref 20–29)
Calcium: 9.5 mg/dL (ref 8.6–10.2)
Chloride: 104 mmol/L (ref 96–106)
Creatinine, Ser: 1.2 mg/dL (ref 0.76–1.27)
Globulin, Total: 2.1 g/dL (ref 1.5–4.5)
Glucose: 80 mg/dL (ref 70–99)
Potassium: 4.5 mmol/L (ref 3.5–5.2)
Sodium: 139 mmol/L (ref 134–144)
Total Protein: 6.6 g/dL (ref 6.0–8.5)
eGFR: 61 mL/min/1.73 (ref >59)

## 2023-10-02 LAB — CBC WITH DIFFERENTIAL/PLATELET
Basophils Absolute: 0.1 x10E3/uL (ref 0.0–0.2)
Basos: 1 %
EOS (ABSOLUTE): 0.2 x10E3/uL (ref 0.0–0.4)
Eos: 3 %
Hematocrit: 51.5 % — ABNORMAL HIGH (ref 37.5–51.0)
Hemoglobin: 16.1 g/dL (ref 13.0–17.7)
Immature Grans (Abs): 0.1 x10E3/uL (ref 0.0–0.1)
Immature Granulocytes: 1 %
Lymphocytes Absolute: 1.5 x10E3/uL (ref 0.7–3.1)
Lymphs: 20 %
MCH: 29.5 pg (ref 26.6–33.0)
MCHC: 31.3 g/dL — ABNORMAL LOW (ref 31.5–35.7)
MCV: 94 fL (ref 79–97)
Monocytes Absolute: 0.7 x10E3/uL (ref 0.1–0.9)
Monocytes: 9 %
Neutrophils Absolute: 5 x10E3/uL (ref 1.4–7.0)
Neutrophils: 66 %
Platelets: 174 x10E3/uL (ref 150–450)
RBC: 5.46 x10E6/uL (ref 4.14–5.80)
RDW: 13.5 % (ref 11.6–15.4)
WBC: 7.5 x10E3/uL (ref 3.4–10.8)

## 2023-10-02 LAB — LIPID PANEL WITH LDL/HDL RATIO
Cholesterol, Total: 93 mg/dL — ABNORMAL LOW (ref 100–199)
HDL: 41 mg/dL (ref >39)
LDL Chol Calc (NIH): 31 mg/dL (ref 0–99)
LDL/HDL Ratio: 0.8 ratio (ref 0.0–3.6)
Triglycerides: 117 mg/dL (ref 0–149)
VLDL Cholesterol Cal: 21 mg/dL (ref 5–40)

## 2023-10-05 ENCOUNTER — Telehealth: Payer: Self-pay

## 2023-10-05 NOTE — Telephone Encounter (Signed)
Labs not yet reviewed by provider

## 2023-10-05 NOTE — Telephone Encounter (Signed)
 Copied from CRM [id number]. Topic: Clinical - Lab/Test Results >> Oct 05, 2023  1:28 PM Corin V wrote: Reason for CRM: Patient called to follow up on lab results. Please review and call patient with results and send copy to patient since his MyChart account is having issues >> Oct 05, 2023  1:34 PM Carmell R wrote: Pt called back to ensure his request was sent regarding his test results. I confirmed his request was sent.

## 2023-10-07 NOTE — Telephone Encounter (Signed)
Patient wife informed (on [place])

## 2023-10-13 ENCOUNTER — Ambulatory Visit: Payer: Self-pay

## 2023-10-13 NOTE — Telephone Encounter (Signed)
 Patient scheduled for 10/14/23 with Dr. Alvia.

## 2023-10-13 NOTE — Telephone Encounter (Signed)
 FYI Only or Action Required?: FYI only for provider.  Patient was last seen in primary care on 10/01/2023 by Alvia Bring, DO.  Called Nurse Triage reporting Flank Pain.  Symptoms began several days ago.  Interventions attempted: Nothing.  Symptoms are: stable.  Triage Disposition: See Physician Within 24 Hours  Patient/caregiver understands and will follow disposition?: Yes    Copied from CRM #[id number]. Topic: Clinical - Red Word Triage >> Oct 13, 2023  3:28 PM Miquel SAILOR wrote: Red Word that prompted transfer to Nurse Triage: RT Kidney stone/4 days still in pain/Level 7-8 pain Reason for Disposition  MODERATE pain (e.g., interferes with normal activities or awakens from sleep)  Answer Assessment - Initial Assessment Questions Kidney stone a few years ago, similar pain so thinks its that again. Sleeping in recliner, but having difficulty sleeping. Patient booked tomorrow afternoon with PCP but would like to know if he should just be referred out to Urology instead. Please advise   1. LOCATION: Where does it hurt? (e.g., left, right)     R side / back  2. ONSET: When did the pain start?     4 days   3. SEVERITY: How bad is the pain? (e.g., Scale 1-10; mild, moderate, or severe)     7-8/10, describes it as a knife in the back  4. PATTERN: Does the pain come and go, or is it constant?      Pretty constant, more so when moving into a position.   5. CAUSE: What do you think is causing the pain?     Kidney Stones   6. OTHER SYMPTOMS:  Do you have any other symptoms? (e.g., fever, abdomen pain, vomiting, leg weakness, burning with urination, blood in urine)     No  Protocols used: Flank Pain-A-AH

## 2023-10-14 ENCOUNTER — Encounter: Payer: Self-pay | Admitting: Family Medicine

## 2023-10-14 ENCOUNTER — Ambulatory Visit (INDEPENDENT_AMBULATORY_CARE_PROVIDER_SITE_OTHER)

## 2023-10-14 ENCOUNTER — Ambulatory Visit (INDEPENDENT_AMBULATORY_CARE_PROVIDER_SITE_OTHER): Admitting: Family Medicine

## 2023-10-14 VITALS — BP 134/87 | HR 75 | Ht 79.0 in | Wt 303.0 lb

## 2023-10-14 DIAGNOSIS — R109 Unspecified abdominal pain: Secondary | ICD-10-CM | POA: Diagnosis not present

## 2023-10-14 DIAGNOSIS — R8290 Unspecified abnormal findings in urine: Secondary | ICD-10-CM | POA: Diagnosis not present

## 2023-10-14 LAB — POCT URINALYSIS DIP (CLINITEK)
Bilirubin, UA: NEGATIVE
Blood, UA: NEGATIVE
Glucose, UA: NEGATIVE mg/dL
Ketones, POC UA: NEGATIVE mg/dL
Nitrite, UA: POSITIVE — AB
POC PROTEIN,UA: NEGATIVE
Spec Grav, UA: 1.025 (ref 1.010–1.025)
Urobilinogen, UA: 0.2 U/dL
pH, UA: 5.5 (ref 5.0–8.0)

## 2023-10-14 NOTE — Progress Notes (Signed)
 Jacob Mitchell - 78 y.o. male MRN [id number]  Date of birth: [birth year]  Subjective Chief Complaint  Patient presents with   Flank Pain    HPI ICHAEL Mitchell is a 78 y.o. male here today with complaint of R flank pain.  Initially started with bilateral flank pain.  Took asa and had improvement on L side.  Increased his fluid intake and symptoms on R side improved today.  He is currently pain free.  Pain felt similar to previous kidney stone.  Denies dysuria, urinary frequency or urgency.    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   Shellfish Allergy     Past Medical History:  Diagnosis Date   Arm DVT (deep venous thromboembolism), acute, left (HCC) 03/27/2021   Benign localized prostatic hyperplasia with lower urinary tract symptoms (LUTS)    Complication of anesthesia    pt says he gets restless when he is waking up and nurses have had to hold him down- happened several surgeries ago    Diverticulosis of colon    History of colon polyps    History of kidney stones    History of MRSA infection 2007   post right shoulder surgery   History of urinary retention 05/31/2017   due to BPH   Incomplete emptying of bladder    Left arm swelling 08/21/2020   OA (osteoarthritis)    Peripheral neuropathy    feet-- hx frost bite   Pinched nerve in neck    right index and middle fingers numbness   Wears glasses    Wears hearing aid in both ears     Past Surgical History:  Procedure Laterality Date   CATARACT EXTRACTION Left 01/13/2020   COLONOSCOPY     CYST REMOVAL LEG Right 1975   CYSTOSCOPY WITH INSERTION OF UROLIFT N/A 07/20/2017   Procedure: CYSTOSCOPY WITH INSERTION OF UROLIFT;  Surgeon: Sherrilee Belvie CROME, MD;  Location: [facility];   Service: Urology;  Laterality: N/A;   CYSTOSCOPY WITH INSERTION OF UROLIFT     DISTAL CLAVICLE EXCISION Left 1977   left shoulder   ELBOW SURGERY Right 1969;  1980   HAND SURGERY Right 1968   INCISION AND DRAINAGE HIP Left 05/01/2020   Procedure: IRRIGATION AND DEBRIDEMENT LEFT HIP SEROMA;  Surgeon: Ernie Cough, MD;  Location: [facility];  Service: Orthopedics;  Laterality: Left;  90 mins   LAMINECTOMY  1970   L4-5   LUMBAR FUSION  2013  approx.   L1--L5   ORIF HIP FRACTURE Left 01/17/2020   Procedure: OPEN REDUCTION INTERNAL FIXATION LEFT HIP GREATER TROCHANTER FRACTURE;  Surgeon: Ernie Cough, MD;  Location: [facility];  Service: Orthopedics;  Laterality: Left;  90 MINS   ORIF PERIPROSTHETIC FRACTURE Left 02/27/2020   Procedure: OPEN REDUCTION INTERNAL FIXATION (ORIF) LEFT PERI-PROSTETIC  HIP FRACTURE;  Surgeon: Ernie Cough, MD;  Location: [facility];  Service: Orthopedics;  Laterality: Left;    ROTATOR CUFF REPAIR Right x5    last one 2007   5 surgeries in 9 days--- original repair then 4 sx'  I&D sx's for MRSA infection   SHOULDER ARTHROSCOPY WITH DISTAL CLAVICLE RESECTION Left 1977   SHOULDER SURGERY Left 1976   TOTAL HIP ARTHROPLASTY Left 11/23/2015  Procedure: LEFT TOTAL HIP ARTHROPLASTY ANTERIOR APPROACH;  Surgeon: Donnice Car, MD;  Location: [facility];  Service: Orthopedics;  Laterality: Left;   TOTAL HIP REVISION     10/14/16 Dr. Car   TOTAL HIP REVISION Left 10/14/2016   Procedure: TOTAL HIP REVISION POSTERIOR APPROACH;  Surgeon: Car Donnice, MD;  Location: [facility];  Service: Orthopedics;  Laterality: Left; (total of 4 hip surgeries in all)    Social History   Socioeconomic History   Marital status: Married    Spouse name: Not on file   Number of children: Not on file   Years of education: Not on file   Highest education level: Bachelor's degree (e.g., BA, AB, BS)  Occupational History   Not on file  Tobacco Use   Smoking status: Never   Smokeless tobacco: Never  Vaping  Use   Vaping status: Never Used  Substance and Sexual Activity   Alcohol use: No   Drug use: No   Sexual activity: Yes  Other Topics Concern   Not on file  Social History Narrative   Not on file   Social Drivers of Health   Financial Resource Strain: Low Risk  (12/31/2022)   Overall Financial Resource Strain (CARDIA)    Difficulty of Paying Living Expenses: Not hard at all  Food Insecurity: No Food Insecurity (12/31/2022)   Hunger Vital Sign    Worried About Running Out of Food in the Last Year: Never true    Ran Out of Food in the Last Year: Never true  Transportation Needs: No Transportation Needs (12/31/2022)   PRAPARE - Administrator, Civil Service (Medical): No    Lack of Transportation (Non-Medical): No  Physical Activity: Insufficiently Active (12/31/2022)   Exercise Vital Sign    Days of Exercise per Week: 3 days    Minutes of Exercise per Session: 30 min  Stress: No Stress Concern Present (12/31/2022)   [employer] of Occupational Health - Occupational Stress Questionnaire    Feeling of Stress : Not at all  Social Connections: Socially Integrated (12/31/2022)   Social Connection and Isolation Panel    Frequency of Communication with Friends and Family: More than three times a week    Frequency of Social Gatherings with Friends and Family: Twice a week    Attends Religious Services: More than 4 times per year    Active Member of [employer] or Organizations: Yes    Attends Engineer, structural: More than 4 times per year    Marital Status: Married    Family History  Problem Relation Age of Onset   Alcohol abuse Father    Drug abuse Brother     Health Maintenance  Topic Date Due   Medicare Annual Wellness (AWV)  Never done   DTaP/Tdap/Td (1 - Tdap) Never done   INFLUENZA VACCINE  09/11/2023   COVID-19 Vaccine (4 - 2025-26 season) 10/12/2023   Pneumococcal Vaccine: 50+ Years  Completed   Zoster Vaccines- Shingrix  Completed   HPV  VACCINES  Aged Out   Meningococcal B Vaccine  Aged Out     ----------------------------------------------------------------------------------------------------------------------------------------------------------------------------------------------------------------- Physical Exam BP 134/87 (BP Location: Right Arm, Patient Position: Sitting, Cuff Size: Large)   Pulse 75   Ht 6' 7 (2.007 m)   Wt (!) 303 lb (137.4 kg)   SpO2 96%   BMI 34.13 kg/m   Physical Exam Constitutional:      Appearance: Normal appearance.  Eyes:     General: No scleral icterus.  Cardiovascular:     Rate and Rhythm: Normal rate and regular rhythm.  Pulmonary:     Effort: Pulmonary effort is normal.     Breath sounds: Normal breath sounds.  Neurological:     Mental Status: He is alert.  Psychiatric:        Mood and Affect: Mood normal.        Behavior: Behavior normal.     ------------------------------------------------------------------------------------------------------------------------------------------------------------------------------------------------------------------- Assessment and Plan  Acute flank pain UA without blood.  Pain improved at this point.  May have passed stone into bladder.  KUB ordered.    No orders of the defined types were placed in this encounter.   No follow-ups on file.

## 2023-10-14 NOTE — Telephone Encounter (Signed)
 Patient seen in office today with Dr. alvia

## 2023-10-14 NOTE — Assessment & Plan Note (Signed)
 UA without blood.  Pain improved at this point.  May have passed stone into bladder.  KUB ordered.

## 2023-10-14 NOTE — Patient Instructions (Addendum)
 Continue to push fluids.  We'll be in touch with xray results.  If symptoms return let me know.

## 2023-10-16 ENCOUNTER — Telehealth: Payer: Self-pay

## 2023-10-16 NOTE — Telephone Encounter (Signed)
 Contacted pt with results.   Still feeling pain. It had decreased but is resurging. OK with CT scan as next step.   Sent to PCP for orders.

## 2023-10-16 NOTE — Telephone Encounter (Signed)
 Copied from CRM [id number]. Topic: Clinical - Lab/Test Results >> Oct 16, 2023 10:11 AM Carlyon D wrote: Reason for CRM: Pt calling in regards to an xray he had pt would like an update on the results

## 2023-10-18 LAB — URINE CULTURE

## 2023-10-19 ENCOUNTER — Ambulatory Visit: Payer: Self-pay | Admitting: Family Medicine

## 2023-10-19 ENCOUNTER — Emergency Department (HOSPITAL_BASED_OUTPATIENT_CLINIC_OR_DEPARTMENT_OTHER)
Admission: EM | Admit: 2023-10-19 | Discharge: 2023-10-19 | Disposition: A | Discharge status: Home / Self Care | Source: Ambulatory Visit | Attending: Emergency Medicine | Admitting: Emergency Medicine

## 2023-10-19 ENCOUNTER — Telehealth: Payer: Self-pay | Admitting: Family Medicine

## 2023-10-19 ENCOUNTER — Other Ambulatory Visit: Payer: Self-pay

## 2023-10-19 ENCOUNTER — Emergency Department (HOSPITAL_BASED_OUTPATIENT_CLINIC_OR_DEPARTMENT_OTHER)

## 2023-10-19 ENCOUNTER — Encounter (HOSPITAL_BASED_OUTPATIENT_CLINIC_OR_DEPARTMENT_OTHER): Payer: Self-pay | Admitting: Emergency Medicine

## 2023-10-19 DIAGNOSIS — R109 Unspecified abdominal pain: Secondary | ICD-10-CM | POA: Diagnosis present

## 2023-10-19 DIAGNOSIS — R338 Other retention of urine: Secondary | ICD-10-CM

## 2023-10-19 DIAGNOSIS — N3000 Acute cystitis without hematuria: Secondary | ICD-10-CM | POA: Insufficient documentation

## 2023-10-19 LAB — COMPREHENSIVE METABOLIC PANEL WITH GFR
ALT: 15 U/L (ref 0–44)
AST: 20 U/L (ref 15–41)
Albumin: 4.5 g/dL (ref 3.5–5.0)
Alkaline Phosphatase: 93 U/L (ref 38–126)
Anion gap: 14 (ref 5–15)
BUN: 28 mg/dL — ABNORMAL HIGH (ref 8–23)
CO2: 19 mmol/L — ABNORMAL LOW (ref 22–32)
Calcium: 9.8 mg/dL (ref 8.9–10.3)
Chloride: 106 mmol/L (ref 98–111)
Creatinine, Ser: 1.38 mg/dL — ABNORMAL HIGH (ref 0.61–1.24)
GFR, Estimated: 52 mL/min — ABNORMAL LOW (ref >60)
Glucose, Bld: 100 mg/dL — ABNORMAL HIGH (ref 70–99)
Potassium: 4.6 mmol/L (ref 3.5–5.1)
Sodium: 138 mmol/L (ref 135–145)
Total Bilirubin: 0.8 mg/dL (ref 0.0–1.2)
Total Protein: 7 g/dL (ref 6.5–8.1)

## 2023-10-19 LAB — URINALYSIS, ROUTINE W REFLEX MICROSCOPIC
Bilirubin Urine: NEGATIVE
Glucose, UA: NEGATIVE mg/dL
Hgb urine dipstick: NEGATIVE
Ketones, ur: NEGATIVE mg/dL
Nitrite: POSITIVE — AB
Protein, ur: NEGATIVE mg/dL
Specific Gravity, Urine: 1.021 (ref 1.005–1.030)
pH: 5.5 (ref 5.0–8.0)

## 2023-10-19 LAB — CBC WITH DIFFERENTIAL/PLATELET
Abs Immature Granulocytes: 0.03 K/uL (ref 0.00–0.07)
Basophils Absolute: 0.1 K/uL (ref 0.0–0.1)
Basophils Relative: 1 %
Eosinophils Absolute: 0.2 K/uL (ref 0.0–0.5)
Eosinophils Relative: 3 %
HCT: 49.2 % (ref 39.0–52.0)
Hemoglobin: 16.5 g/dL (ref 13.0–17.0)
Immature Granulocytes: 0 %
Lymphocytes Relative: 15 %
Lymphs Abs: 1.2 K/uL (ref 0.7–4.0)
MCH: 30.4 pg (ref 26.0–34.0)
MCHC: 33.5 g/dL (ref 30.0–36.0)
MCV: 90.8 fL (ref 80.0–100.0)
Monocytes Absolute: 0.7 K/uL (ref 0.1–1.0)
Monocytes Relative: 9 %
Neutro Abs: 5.6 K/uL (ref 1.7–7.7)
Neutrophils Relative %: 72 %
Platelets: 187 K/uL (ref 150–400)
RBC: 5.42 MIL/uL (ref 4.22–5.81)
RDW: 13.8 % (ref 11.5–15.5)
WBC: 7.8 K/uL (ref 4.0–10.5)
nRBC: 0 % (ref 0.0–0.2)

## 2023-10-19 LAB — LIPASE, BLOOD: Lipase: 20 U/L (ref 11–51)

## 2023-10-19 MED ORDER — SODIUM CHLORIDE 0.9 % IV BOLUS
1000.0000 mL | Freq: Once | INTRAVENOUS | Status: AC
  Administered 2023-10-19: 1000 mL via INTRAVENOUS

## 2023-10-19 MED ORDER — ONDANSETRON HCL 4 MG/2ML IJ SOLN
4.0000 mg | Freq: Once | INTRAMUSCULAR | Status: AC
  Administered 2023-10-19: 4 mg via INTRAVENOUS
  Filled 2023-10-19: qty 2

## 2023-10-19 MED ORDER — CEFPODOXIME PROXETIL 100 MG PO TABS: 100.0000 mg | ORAL_TABLET | Freq: Two times a day (BID) | ORAL | 0 refills | Status: AC

## 2023-10-19 MED ORDER — MORPHINE SULFATE (PF) 4 MG/ML IV SOLN
4.0000 mg | Freq: Once | INTRAVENOUS | Status: AC
  Administered 2023-10-19: 4 mg via INTRAVENOUS
  Filled 2023-10-19: qty 1

## 2023-10-19 MED ORDER — LIDOCAINE HCL URETHRAL/MUCOSAL 2 % EX GEL
1.0000 | Freq: Once | CUTANEOUS | Status: AC
  Administered 2023-10-19: 1 via URETHRAL
  Filled 2023-10-19: qty 11

## 2023-10-19 NOTE — ED Triage Notes (Signed)
 Pt c/o RT side lower back pain that radiates to RLQ x 1 week. Seen at pcp

## 2023-10-19 NOTE — Telephone Encounter (Signed)
 Patient being seen in ER today.

## 2023-10-19 NOTE — ED Provider Notes (Signed)
 [place] EMERGENCY DEPARTMENT AT [facility] Provider Note   CSN: [id number] Arrival date & time: 10/19/23  1013     Patient presents with: Flank Pain   Jacob Mitchell is a 78 y.o. male.   78 yo M with a chief complaints of right flank pain.  Feels just like a kidney stone off and on for the past week or so.  Starts in his right flank and radiates to the lower abdomen.  Seems to come and go.  He saw his primary care provider who obtained an x-ray and a urine sample and told him it was unlikely to be a stone and have him keep an eye on it and he would see him this week.  Patient felt like his pain has persisted, no fevers.  No urinary symptoms.  Here for evaluation.  Patient pain does seem to be positional.  Worse when he rolls from his left to his right.  Denies trauma.  Chronic neuropathy to his feet bilaterally denies any new numbness or weakness to his legs.   Flank Pain       Prior to Admission medications   Medication Sig Start Date End Date Taking? Authorizing Provider  cefpodoxime  (VANTIN ) 100 MG tablet Take 1 tablet (100 mg total) by mouth 2 (two) times daily for 7 days. 10/19/23 10/26/23 Yes Emil Share, DO  atorvastatin  (LIPITOR) 40 MG tablet TAKE 1 TABLET BY MOUTH DAILY 08/12/23   Alvia Bring, DO  solifenacin  (VESICARE ) 5 MG tablet Take 1 tablet (5 mg total) by mouth daily. 10/01/23   Alvia Bring, DO    Allergies: Aspartame and phenylalanine, Daptomycin , and Shellfish allergy    Review of Systems  Genitourinary:  Positive for flank pain.    Updated Vital Signs BP 134/82   Pulse 91   Temp 97.8 F (36.6 C) (Oral)   Resp 16   Wt 134.3 kg   SpO2 99%   BMI 33.35 kg/m   Physical Exam Vitals and nursing note reviewed.  Constitutional:      Appearance: He is well-developed.  HENT:     Head: Normocephalic and atraumatic.  Eyes:     Pupils: Pupils are equal, round, and reactive to light.  Neck:     Vascular: No JVD.  Cardiovascular:     Rate and  Rhythm: Normal rate and regular rhythm.     Heart sounds: No murmur heard.    No friction rub. No gallop.  Pulmonary:     Effort: No respiratory distress.     Breath sounds: No wheezing.  Abdominal:     General: There is no distension.     Tenderness: There is no abdominal tenderness. There is no guarding or rebound.  Musculoskeletal:        General: Normal range of motion.     Cervical back: Normal range of motion and neck supple.     Comments: Old midline surgical scar along the L-spine.  No rash.  Pulse and motor intact to the right lower extremity patient says he cannot really feel his feet bilaterally  Skin:    Coloration: Skin is not pale.     Findings: No rash.  Neurological:     Mental Status: He is alert and oriented to person, place, and time.  Psychiatric:        Behavior: Behavior normal.     (all labs ordered are listed, but only abnormal results are displayed) Labs Reviewed  URINALYSIS, ROUTINE W REFLEX MICROSCOPIC - Abnormal; Notable  for the following components:      Result Value   Nitrite POSITIVE (*)    Leukocytes,Ua SMALL (*)    Bacteria, UA FEW (*)    All other components within normal limits  COMPREHENSIVE METABOLIC PANEL WITH GFR - Abnormal; Notable for the following components:   CO2 19 (*)    Glucose, Bld 100 (*)    BUN 28 (*)    Creatinine, Ser 1.38 (*)    GFR, Estimated 52 (*)    All other components within normal limits  CBC WITH DIFFERENTIAL/PLATELET  LIPASE, BLOOD    EKG: None  Radiology: CT L-SPINE NO CHARGE Result Date: 10/19/2023 CLINICAL DATA:  Abdominal/flank pain, stone suspected Right-sided lower back pain radiating into the right lower quadrant for 1 week. EXAM: CT Lumbar Spine without contrast TECHNIQUE: Technique: Multiplanar CT images of the lumbar spine were reconstructed from contemporary CT of the Abdomen and Pelvis. RADIATION DOSE REDUCTION: This exam was performed according to the departmental dose-optimization program which  includes automated exposure control, adjustment of the mA and/or kV according to patient size and/or use of iterative reconstruction technique. CONTRAST:  No additional COMPARISON:  None Available. FINDINGS: Segmentation: There are 5 lumbar type vertebral bodies. Alignment: Convex right scoliosis centered at L1-2. The lateral alignment is normal. Vertebrae: Status post posterior decompression and fusion from L3 through S1. The hardware appears intact, without loosening. No evidence of acute fracture, dislocation or osteomyelitis. Multilevel endplate osteophytes. Paraspinal and other soft tissues: No acute paraspinal findings. Abdominopelvic findings dictated separately. Disc levels: The lumbar pedicles are relatively short on a congenital basis. No large disc herniation or high-grade spinal stenosis is demonstrated. The spinal canal appears well decompressed at the operative levels. Multilevel mild-to-moderate foraminal narrowing due to osteophytes and facet hypertrophy. IMPRESSION: 1. No acute osseous findings in the lumbar spine. 2. Status post posterior decompression and fusion from L3 through S1. The hardware appears intact, without loosening. 3. Multilevel mild-to-moderate foraminal narrowing due to osteophytes and facet hypertrophy. 4. Abdominopelvic findings dictated separately. Electronically Signed   By: Elsie Perone M.D.   On: 10/19/2023 12:46   CT Renal Stone Study Result Date: 10/19/2023 CLINICAL DATA:  Abdominal/flank pain, stone suspected Right-sided lower back pain radiating into the right lower quadrant for 1 week. EXAM: CT ABDOMEN AND PELVIS WITHOUT CONTRAST TECHNIQUE: Multidetector CT imaging of the abdomen and pelvis was performed following the standard protocol without IV contrast. RADIATION DOSE REDUCTION: This exam was performed according to the departmental dose-optimization program which includes automated exposure control, adjustment of the mA and/or kV according to patient size and/or  use of iterative reconstruction technique. COMPARISON:  Limited correlation made with chest CT 06/03/2020. FINDINGS: Lower chest: Clear lung bases. No significant pleural or pericardial effusion. Hepatobiliary: The liver has a non cirrhotic morphology without focal abnormality on noncontrast imaging. No evidence of gallstones, gallbladder wall thickening or biliary dilatation. Pancreas: Unremarkable. No pancreatic ductal dilatation or surrounding inflammatory changes. Spleen: Normal in size without focal abnormality. Adrenals/Urinary Tract: Stable 2.5 x 2.0 cm right adrenal nodule measuring -12 HU, consistent with a lipid rich adenoma. The left adrenal gland appears normal. There is a nonobstructing calculus in the lower pole the right kidney measuring 5 mm on image 37/3. No evidence of ureteral calculus or hydronephrosis. Multiple renal cysts bilaterally, measuring up to 7.8 cm in the lower pole of the right kidney and 9.7 cm posteriorly in the mid left kidney. These demonstrate no suspicious features on noncontrast imaging, and no specific  follow-up imaging is recommended. The urinary bladder appears moderately distended without apparent focal abnormality. Stomach/Bowel: No enteric contrast administered. The stomach appears unremarkable for its degree of distension. No evidence of bowel wall thickening, distention or surrounding inflammatory change. The appendix appears normal. Mild distal colonic diverticulosis without evidence of acute inflammation. Vascular/Lymphatic: There are no enlarged abdominal or pelvic lymph nodes. Mild aortoiliac atherosclerosis without evidence of aneurysm. Reproductive: Sequela of prior Urolift procedure in the prostate gland. Prominent left-sided hydrocele. The right testis appears partially retracted into the right inguinal canal. Other: No evidence of abdominal wall mass or hernia. No ascites or pneumoperitoneum. Musculoskeletal: No acute or significant osseous findings.  Postsurgical changes in the lumbar spine; spine details dictated separately. Previous left total hip arthroplasty with prominent heterotopic ossification surrounding the proximal left femur. IMPRESSION: 1. No acute findings or explanation for the patient's symptoms. 2. Nonobstructing right renal calculus. No evidence of ureteral calculus or hydronephrosis. 3. Prominent left-sided hydrocele. The right testis appears partially retracted into the right inguinal canal. Correlate clinically. 4. Stable right adrenal adenoma. 5.  Aortic Atherosclerosis (ICD10-I70.0). Electronically Signed   By: Elsie Perone M.D.   On: 10/19/2023 12:40     Procedures   Medications Ordered in the ED  morphine  (PF) 4 MG/ML injection 4 mg (4 mg Intravenous Given 10/19/23 1116)  ondansetron  (ZOFRAN ) injection 4 mg (4 mg Intravenous Given 10/19/23 1116)  sodium chloride  0.9 % bolus 1,000 mL (0 mLs Intravenous Stopped 10/19/23 1218)  lidocaine  (XYLOCAINE ) 2 % jelly 1 Application (1 Application Urethral Given 10/19/23 1422)                                    Medical Decision Making Amount and/or Complexity of Data Reviewed Labs: ordered. Radiology: ordered.  Risk Prescription drug management.   78 yo M with a chief complaints of right-sided back pain that radiates to the abdomen.  Been going on for about a week.  Atraumatic.  Saw his primary care physician who told him to keep an eye on it and would see him this week.  Records were reviewed and show on that visit it does look like his urine grew Serratia.  Was not started on antibiotics.  Has no urinary symptoms.  Will repeat UA here.  CT stone study with reformats of the L-spine.  Blood work treat pain and reassess.  CT imaging on my independent interpretation concerning for significantly large bladder.  Radiology read without obvious acute finding.  Postvoid residual greater than a liter.  Discussed risk benefits of Foley catheter placement.  Jacob Mitchell adds history that  the patient was recently started on a medication to help him urinate less at home.  Will place Foley at this time.   Patient had over 2 L of urine output.  Will discharge home.  Urology follow-up.  2:47 PM:  I have discussed the diagnosis/risks/treatment options with the patient and family.  Evaluation and diagnostic testing in the emergency department does not suggest an emergent condition requiring admission or immediate intervention beyond what has been performed at this time.  They will follow up with PCP, Urology. We also discussed returning to the ED immediately if new or worsening sx occur. We discussed the sx which are most concerning (e.g., sudden worsening pain, fever, inability to tolerate by mouth) that necessitate immediate return. Medications administered to the patient during their visit and any new prescriptions provided to the  patient are listed below.  Medications given during this visit Medications  morphine  (PF) 4 MG/ML injection 4 mg (4 mg Intravenous Given 10/19/23 1116)  ondansetron  (ZOFRAN ) injection 4 mg (4 mg Intravenous Given 10/19/23 1116)  sodium chloride  0.9 % bolus 1,000 mL (0 mLs Intravenous Stopped 10/19/23 1218)  lidocaine  (XYLOCAINE ) 2 % jelly 1 Application (1 Application Urethral Given 10/19/23 1422)     The patient appears reasonably screen and/or stabilized for discharge and I doubt any other medical condition or other [facility] requiring further screening, evaluation, or treatment in the ED at this time prior to discharge.        Final diagnoses:  Right flank pain  Acute cystitis without hematuria  Acute urinary retention    ED Discharge Orders          Ordered    cefpodoxime  (VANTIN ) 100 MG tablet  2 times daily        10/19/23 1408               Emil Share, DO 10/19/23 1447

## 2023-10-19 NOTE — Discharge Instructions (Signed)
 Call the urologist today and let them know that she had had a Foley catheter placed.  See when they can see you in clinic.

## 2023-10-19 NOTE — Telephone Encounter (Signed)
 FYI Only or Action Required?: FYI only for provider.  Patient was last seen in primary care on 10/14/2023 by Alvia Bring, DO.  Called Nurse Triage reporting No chief complaint on file..  Symptoms began a week ago.  Interventions attempted: OTC medications: Tylenol  .  Symptoms are: unchanged.  Triage Disposition: See PCP in 24 hours.  Patient/caregiver understands and will follow disposition?: No, wishes to be seen in ED; appt. Declined.         Symptoms persist from visit on 9/3, flank pain present in mid/lower back. Pain rating is a 7/10. Patient is taking Tylenol  for the pain, some relief noted. No bowel-bladder problems. Xray showed no kidney stones. Patient advised to follow-up if symptoms persist. Patient offered soonest available appointment but stated he will go to the ED now, patient advised an appt. Tomorrow would be fine, but he insists on going to the ED; he will follow -up later.          Copied from CRM #[id number]. Topic: Clinical - Red Word Triage >> Oct 19, 2023  9:02 AM Miquel SAILOR wrote: Red Word that prompted transfer to Nurse Triage: Visit on 09/03 but still in a lot pain on Middle RT side back pain goes all to front of belly. For 1 week getting worse.

## 2023-12-10 ENCOUNTER — Other Ambulatory Visit: Payer: Self-pay | Admitting: Family Medicine

## 2023-12-10 MED ORDER — AMBULATORY NON FORMULARY MEDICATION: 0 refills | Status: AC

## 2023-12-11 ENCOUNTER — Encounter: Payer: Self-pay | Admitting: Family Medicine

## 2024-01-01 ENCOUNTER — Other Ambulatory Visit: Payer: Self-pay | Admitting: Family Medicine

## 2024-01-05 ENCOUNTER — Telehealth: Payer: Self-pay

## 2024-01-05 NOTE — Telephone Encounter (Signed)
 Copied from CRM [id number]. Topic: Clinical - Medication Question >> Jan 05, 2024 12:46 PM Montie POUR wrote: Reason for CRM:  [name] wants to know why solifenacin  (VESICARE ) 5 MG tablet was called in for him to take. Please call him at [phone] to discuss if he needs to take this medication. Thanks

## 2024-01-06 ENCOUNTER — Telehealth: Payer: Self-pay

## 2024-01-06 NOTE — Telephone Encounter (Signed)
 Spoke to patient. Advised medication was originally ordered in the ER. Per Dr. Alvia, pt no longer needs to take the medication unless he is experiencing urinary symptoms.   Pt understood. Will attempt to return meds to pharmacy.

## 2024-01-06 NOTE — Telephone Encounter (Signed)
 Patient called wanted to know why he was prescribed Vesicare   States he has not ever taken this medication that he is aware of.  (Vesicare  does show in ER notes from 10/19/2023)    I told him that we would forward this message to Dr. Alvia for review.  He is requesting a response on Mobile number listed as this this is his number and home # is wife's number.

## 2024-02-26 ENCOUNTER — Telehealth: Payer: Self-pay | Admitting: Family Medicine

## 2024-02-26 NOTE — Telephone Encounter (Signed)
 Copied from CRM [id number]. Topic: Appointments - Transfer of Care >> Feb 26, 2024 11:38 AM Deaijah H wrote: Pt is requesting to transfer FROM: [facility] at [facility] - Velma Ku, DO Pt is requesting to transfer TO: [facility] - Garnette CHRISTELLA Simpler, MD Reason for requested transfer: Would like to come back to practice It is the responsibility of the team the patient would like to transfer to (Dr. Simpler) to reach out to the patient if for any reason this transfer is not acceptable.

## 2024-03-07 ENCOUNTER — Encounter: Admitting: Family Medicine

## 2024-05-24 ENCOUNTER — Encounter: Admitting: Family Medicine
